# Patient Record
Sex: Female | Born: 1990 | Hispanic: Yes | Smoking: Former smoker
Health system: Southern US, Community
[De-identification: ages and names within clinical notes are randomized; demographics above are authoritative.]

## PROBLEM LIST (undated history)

## (undated) DIAGNOSIS — K029 Dental caries, unspecified: Secondary | ICD-10-CM

## (undated) DIAGNOSIS — S329XXA Fracture of unspecified parts of lumbosacral spine and pelvis, initial encounter for closed fracture: Secondary | ICD-10-CM

## (undated) DIAGNOSIS — Z9119 Patient's noncompliance with other medical treatment and regimen: Secondary | ICD-10-CM

## (undated) DIAGNOSIS — F502 Bulimia nervosa, unspecified: Secondary | ICD-10-CM

## (undated) DIAGNOSIS — E119 Type 2 diabetes mellitus without complications: Secondary | ICD-10-CM

## (undated) DIAGNOSIS — A630 Anogenital (venereal) warts: Secondary | ICD-10-CM

## (undated) DIAGNOSIS — Z91199 Patient's noncompliance with other medical treatment and regimen due to unspecified reason: Secondary | ICD-10-CM

---
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
## 2010-01-05 ENCOUNTER — Ambulatory Visit: Payer: Self-pay | Admitting: Obstetrics & Gynecology

## 2010-01-08 ENCOUNTER — Ambulatory Visit: Payer: Self-pay | Admitting: Obstetrics & Gynecology

## 2010-01-11 ENCOUNTER — Ambulatory Visit: Payer: Self-pay | Admitting: Obstetrics and Gynecology

## 2010-01-11 ENCOUNTER — Inpatient Hospital Stay (HOSPITAL_COMMUNITY): Admission: AD | Admit: 2010-01-11 | Discharge: 2010-01-11 | Payer: Self-pay | Admitting: Family Medicine

## 2010-01-12 ENCOUNTER — Ambulatory Visit: Payer: Self-pay | Admitting: Obstetrics and Gynecology

## 2010-01-15 ENCOUNTER — Ambulatory Visit: Payer: Self-pay | Admitting: Obstetrics & Gynecology

## 2010-01-19 ENCOUNTER — Ambulatory Visit: Payer: Self-pay | Admitting: Obstetrics & Gynecology

## 2010-01-22 ENCOUNTER — Ambulatory Visit: Payer: Self-pay | Admitting: Obstetrics and Gynecology

## 2010-12-08 LAB — URINALYSIS, ROUTINE W REFLEX MICROSCOPIC
Bilirubin Urine: NEGATIVE
Hgb urine dipstick: NEGATIVE
Ketones, ur: NEGATIVE mg/dL
Nitrite: NEGATIVE
Protein, ur: NEGATIVE mg/dL
Specific Gravity, Urine: 1.015 (ref 1.005–1.030)
Urobilinogen, UA: 0.2 mg/dL (ref 0.0–1.0)

## 2010-12-08 LAB — POCT URINALYSIS DIP (DEVICE)
Bilirubin Urine: NEGATIVE
Bilirubin Urine: NEGATIVE
Glucose, UA: 250 mg/dL — AB
Hgb urine dipstick: NEGATIVE
Ketones, ur: NEGATIVE mg/dL
Ketones, ur: NEGATIVE mg/dL
Protein, ur: NEGATIVE mg/dL
Protein, ur: NEGATIVE mg/dL
Specific Gravity, Urine: >=1.03 (ref 1.005–1.030)
Urobilinogen, UA: 0.2 mg/dL (ref 0.0–1.0)
Urobilinogen, UA: 0.2 mg/dL (ref 0.0–1.0)
pH: 5 (ref 5.0–8.0)
pH: 6 (ref 5.0–8.0)

## 2010-12-09 LAB — POCT URINALYSIS DIP (DEVICE)
Bilirubin Urine: NEGATIVE
Glucose, UA: NEGATIVE mg/dL
Hgb urine dipstick: NEGATIVE
pH: 5.5 (ref 5.0–8.0)

## 2010-12-13 LAB — URINALYSIS, ROUTINE W REFLEX MICROSCOPIC
Hgb urine dipstick: NEGATIVE
Ketones, ur: NEGATIVE mg/dL
Protein, ur: NEGATIVE mg/dL
Specific Gravity, Urine: 1.02 (ref 1.005–1.030)
Urobilinogen, UA: 0.2 mg/dL (ref 0.0–1.0)

## 2010-12-13 LAB — POCT URINALYSIS DIP (DEVICE)
Bilirubin Urine: NEGATIVE
Bilirubin Urine: NEGATIVE
Bilirubin Urine: NEGATIVE
Glucose, UA: NEGATIVE mg/dL
Glucose, UA: NEGATIVE mg/dL
Ketones, ur: NEGATIVE mg/dL
Ketones, ur: NEGATIVE mg/dL
Ketones, ur: NEGATIVE mg/dL
Protein, ur: NEGATIVE mg/dL
Specific Gravity, Urine: 1.02 (ref 1.005–1.030)
pH: 6 (ref 5.0–8.0)
pH: 6.5 (ref 5.0–8.0)

## 2010-12-13 LAB — DIFFERENTIAL
Basophils Absolute: 0.1 10*3/uL (ref 0.0–0.1)
Eosinophils Absolute: 0.1 10*3/uL (ref 0.0–0.7)
Eosinophils Relative: 1 % (ref 0–5)
Monocytes Absolute: 0.5 10*3/uL (ref 0.1–1.0)
Monocytes Relative: 6 % (ref 3–12)
Neutrophils Relative %: 73 % (ref 43–77)

## 2010-12-13 LAB — RPR: RPR Ser Ql: NONREACTIVE

## 2010-12-13 LAB — CBC
MCV: 83.4 fL (ref 78.0–100.0)
RBC: 4.17 MIL/uL (ref 3.87–5.11)
WBC: 8.5 10*3/uL (ref 4.0–10.5)

## 2010-12-13 LAB — HEMOGLOBIN A1C: Hgb A1c MFr Bld: 6.5 % — ABNORMAL HIGH (ref 4.6–6.1)

## 2010-12-13 LAB — HIV ANTIBODY (ROUTINE TESTING W REFLEX): HIV: NONREACTIVE

## 2010-12-13 LAB — HEPATITIS B SURFACE ANTIGEN: Hepatitis B Surface Ag: NEGATIVE

## 2010-12-23 LAB — WET PREP, GENITAL
Trich, Wet Prep: NONE SEEN
Yeast Wet Prep HPF POC: NONE SEEN

## 2010-12-23 LAB — URINE CULTURE

## 2010-12-23 LAB — GC/CHLAMYDIA PROBE AMP, GENITAL: GC Probe Amp, Genital: NEGATIVE

## 2010-12-23 LAB — URINE MICROSCOPIC-ADD ON

## 2010-12-23 LAB — URINALYSIS, ROUTINE W REFLEX MICROSCOPIC
Glucose, UA: 250 mg/dL — AB
Leukocytes, UA: NEGATIVE
Specific Gravity, Urine: 1.02 (ref 1.005–1.030)
pH: 7.5 (ref 5.0–8.0)

## 2010-12-24 LAB — URINE MICROSCOPIC-ADD ON

## 2010-12-24 LAB — URINALYSIS, ROUTINE W REFLEX MICROSCOPIC
Glucose, UA: >1000 mg/dL — AB
Ketones, ur: 15 mg/dL — AB
Leukocytes, UA: NEGATIVE
Nitrite: NEGATIVE
Specific Gravity, Urine: 1.037 — ABNORMAL HIGH (ref 1.005–1.030)
pH: 6.5 (ref 5.0–8.0)

## 2010-12-24 LAB — BASIC METABOLIC PANEL
BUN: 14 mg/dL (ref 6–23)
Chloride: 100 mEq/L (ref 96–112)
Glucose, Bld: 359 mg/dL — ABNORMAL HIGH (ref 70–99)
Potassium: 3.8 mEq/L (ref 3.5–5.1)
Sodium: 134 mEq/L — ABNORMAL LOW (ref 135–145)

## 2010-12-25 LAB — BASIC METABOLIC PANEL
BUN: 14 mg/dL (ref 6–23)
CO2: 29 mEq/L (ref 19–32)
Calcium: 9.5 mg/dL (ref 8.4–10.5)
Chloride: 100 mEq/L (ref 96–112)
Creatinine, Ser: 0.6 mg/dL (ref 0.4–1.2)
GFR calc Af Amer: >60 mL/min (ref >60)
Glucose, Bld: 203 mg/dL — ABNORMAL HIGH (ref 70–99)

## 2010-12-25 LAB — URINE MICROSCOPIC-ADD ON

## 2010-12-25 LAB — URINALYSIS, ROUTINE W REFLEX MICROSCOPIC
Glucose, UA: 100 mg/dL — AB
Ketones, ur: NEGATIVE mg/dL
Protein, ur: NEGATIVE mg/dL
Urobilinogen, UA: 1 mg/dL (ref 0.0–1.0)

## 2010-12-25 LAB — CBC
MCHC: 33.8 g/dL (ref 30.0–36.0)
MCV: 81.8 fL (ref 78.0–100.0)
Platelets: 240 10*3/uL (ref 150–400)
RBC: 4.88 MIL/uL (ref 3.87–5.11)
RDW: 13.1 % (ref 11.5–15.5)

## 2010-12-25 LAB — DIFFERENTIAL
Basophils Absolute: 0.1 10*3/uL (ref 0.0–0.1)
Basophils Relative: 1 % (ref 0–1)
Eosinophils Absolute: 0.3 10*3/uL (ref 0.0–0.7)
Monocytes Relative: 6 % (ref 3–12)
Neutro Abs: 5.4 10*3/uL (ref 1.7–7.7)
Neutrophils Relative %: 61 % (ref 43–77)

## 2010-12-25 LAB — GLUCOSE, CAPILLARY: Glucose-Capillary: 203 mg/dL — ABNORMAL HIGH (ref 70–99)

## 2010-12-25 LAB — PREGNANCY, URINE: Preg Test, Ur: NEGATIVE

## 2010-12-28 LAB — URINALYSIS, ROUTINE W REFLEX MICROSCOPIC
Hgb urine dipstick: NEGATIVE
Nitrite: NEGATIVE
Specific Gravity, Urine: 1.025 (ref 1.005–1.030)
Urobilinogen, UA: 0.2 mg/dL (ref 0.0–1.0)
pH: 5.5 (ref 5.0–8.0)

## 2010-12-28 LAB — WET PREP, GENITAL
Clue Cells Wet Prep HPF POC: NONE SEEN
Trich, Wet Prep: NONE SEEN

## 2010-12-28 LAB — POCT PREGNANCY, URINE: Preg Test, Ur: NEGATIVE

## 2010-12-28 LAB — GC/CHLAMYDIA PROBE AMP, GENITAL: GC Probe Amp, Genital: NEGATIVE

## 2011-06-28 LAB — URINALYSIS, ROUTINE W REFLEX MICROSCOPIC
Glucose, UA: NEGATIVE
Ketones, ur: NEGATIVE
Protein, ur: NEGATIVE
Urobilinogen, UA: 0.2

## 2011-06-28 LAB — GC/CHLAMYDIA PROBE AMP, GENITAL: GC Probe Amp, Genital: NEGATIVE

## 2011-06-28 LAB — WET PREP, GENITAL

## 2011-06-29 LAB — RAPID STREP SCREEN (MED CTR MEBANE ONLY): Streptococcus, Group A Screen (Direct): NEGATIVE

## 2011-06-30 LAB — URINALYSIS, ROUTINE W REFLEX MICROSCOPIC
Ketones, ur: NEGATIVE
Nitrite: NEGATIVE
Protein, ur: NEGATIVE

## 2011-07-02 LAB — URINALYSIS, ROUTINE W REFLEX MICROSCOPIC
Bilirubin Urine: NEGATIVE
Glucose, UA: NEGATIVE
Hgb urine dipstick: NEGATIVE
Specific Gravity, Urine: 1.025
pH: 6.5

## 2011-07-02 LAB — CBC
HCT: 35.5 — ABNORMAL LOW
MCV: 79 — ABNORMAL LOW
Platelets: 230
RDW: 15.6 — ABNORMAL HIGH
WBC: 9.1

## 2011-07-02 LAB — GC/CHLAMYDIA PROBE AMP, GENITAL
Chlamydia, DNA Probe: NEGATIVE
GC Probe Amp, Genital: NEGATIVE

## 2011-07-02 LAB — WET PREP, GENITAL
Trich, Wet Prep: NONE SEEN
Yeast Wet Prep HPF POC: NONE SEEN

## 2011-07-02 LAB — POCT PREGNANCY, URINE: Operator id: 251141

## 2011-09-10 IMAGING — US US OB DETAIL+14 WK
1 series · 18 of 28 positions shown · non-contrast
Comparison: none

OBSTETRICAL ULTRASOUND:
 This ultrasound was performed in The [HOSPITAL], and the [facility] OB/GYN report will be stored to [REDACTED] PACS.  This report is also available in [HOSPITAL]?s accessANYware.

[Series 1: us ob detail+14 wk · 101 acquisitions, 18 frames shown]
[im 1/101]
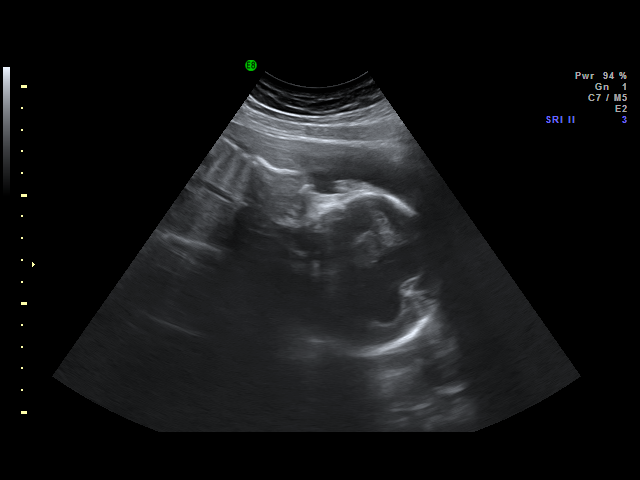
[im 8/101]
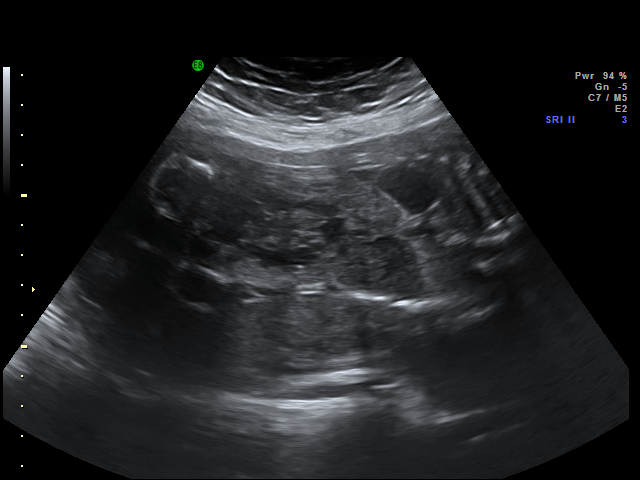
[im 12/101]
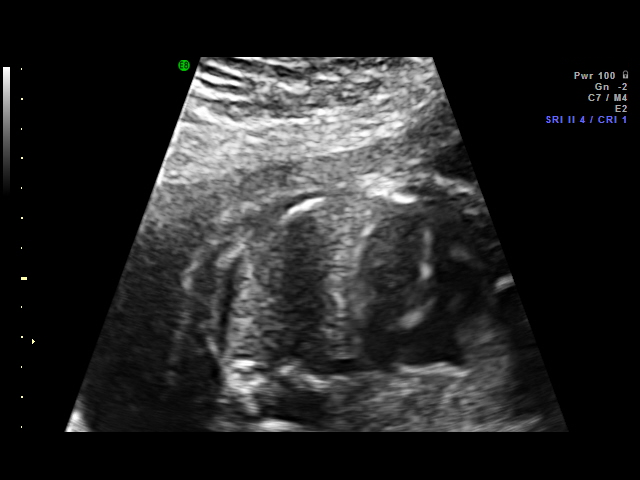
[im 19/101]
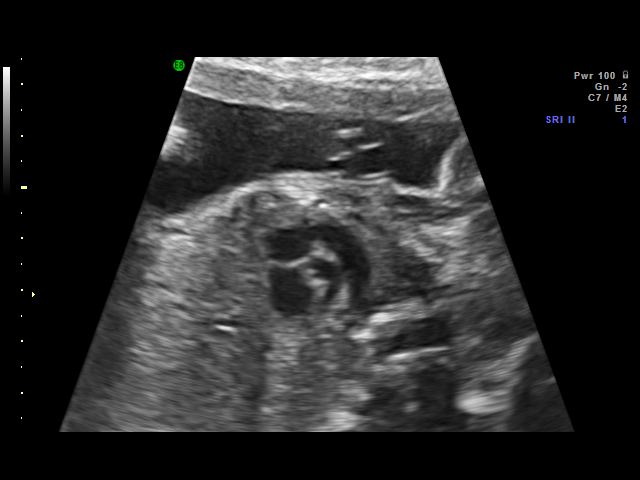
[im 26/101]
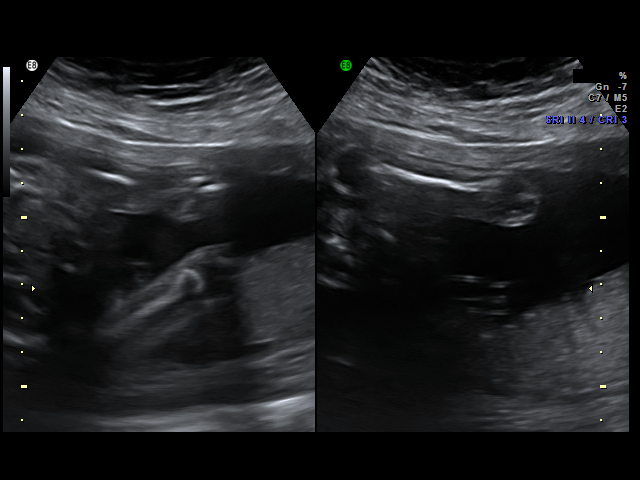
[im 30/101]
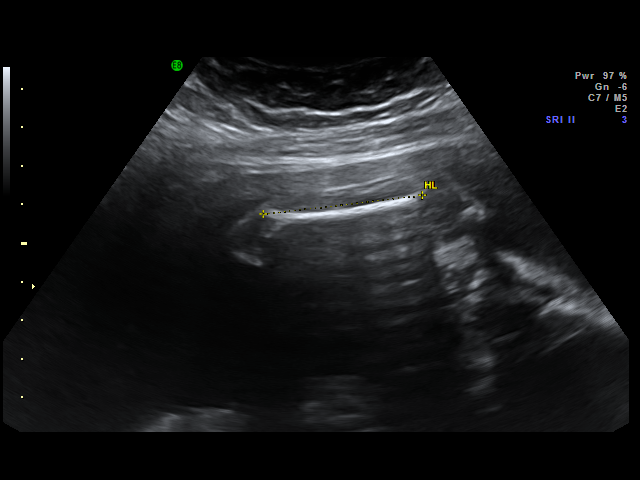
[im 38/101]
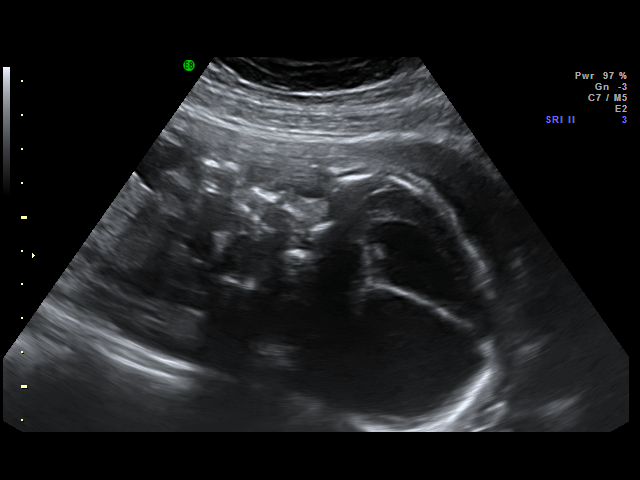
[im 41/101]
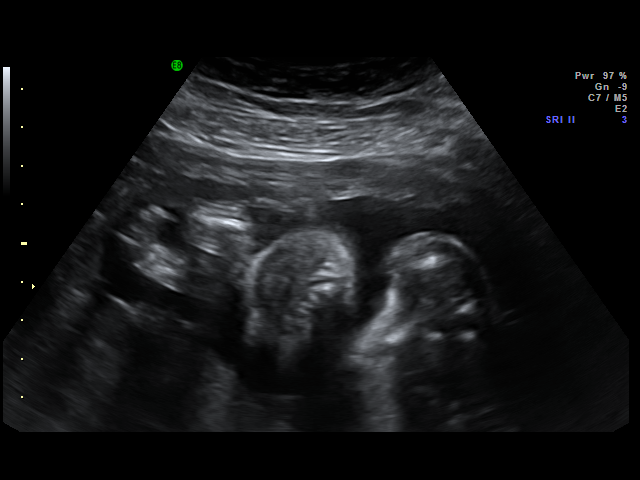
[im 49/101]
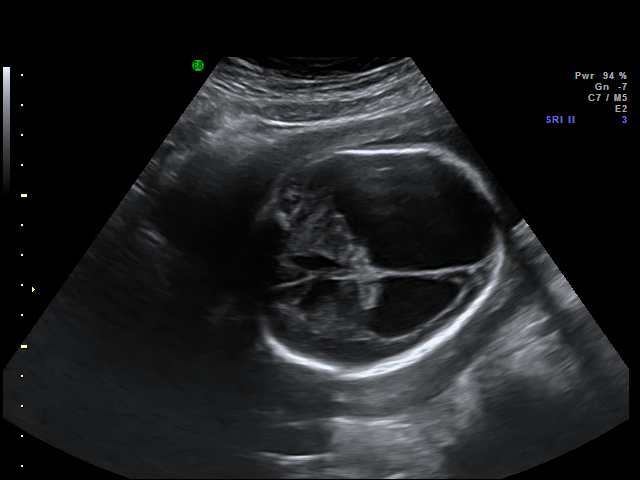
[im 52/101]
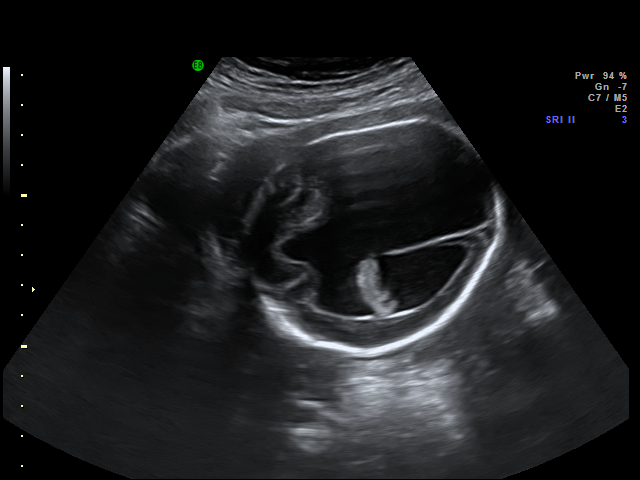
[im 60/101]
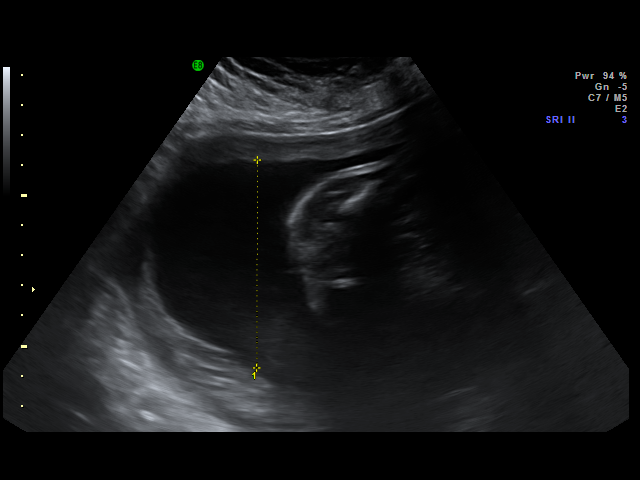
[im 63/101]
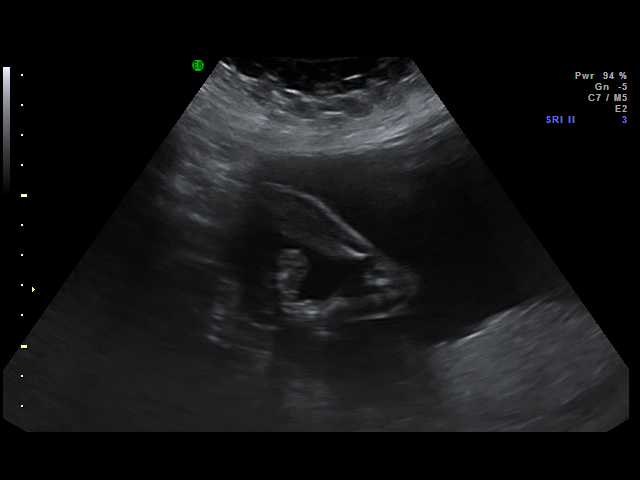
[im 71/101]
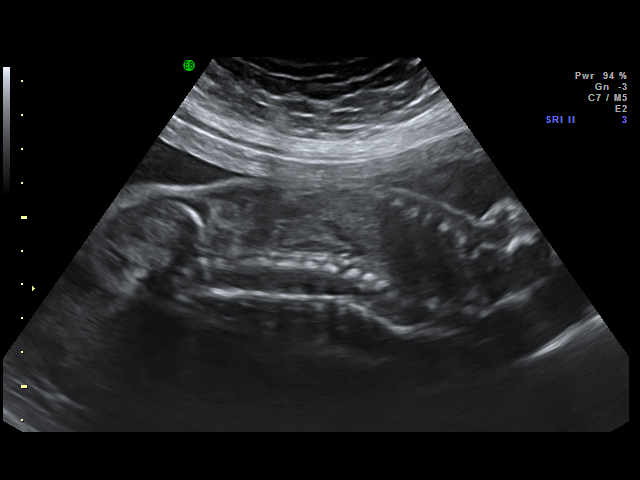
[im 78/101]
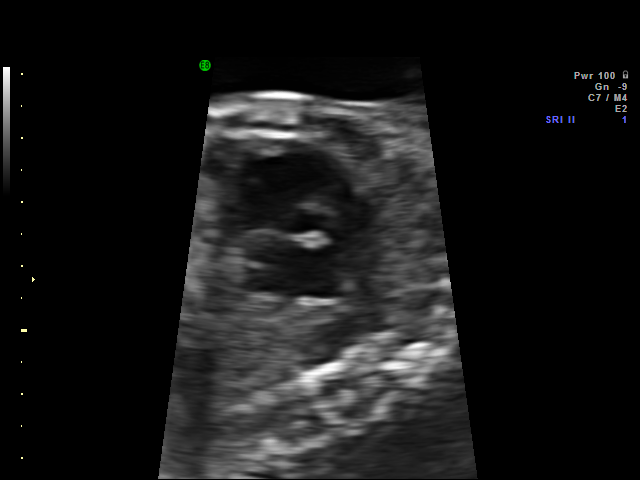
[im 82/101]
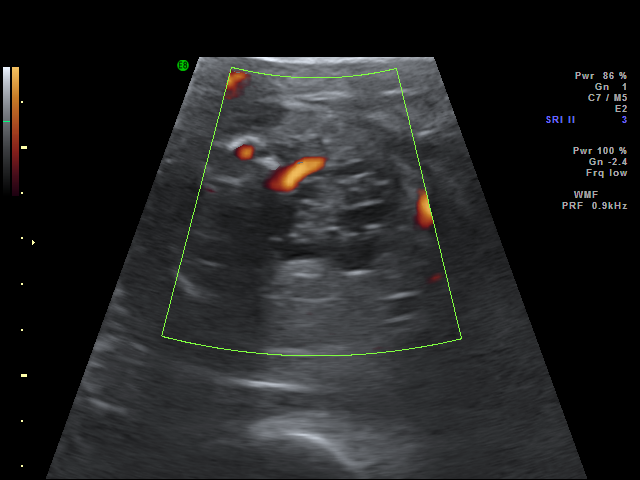
[im 89/101]
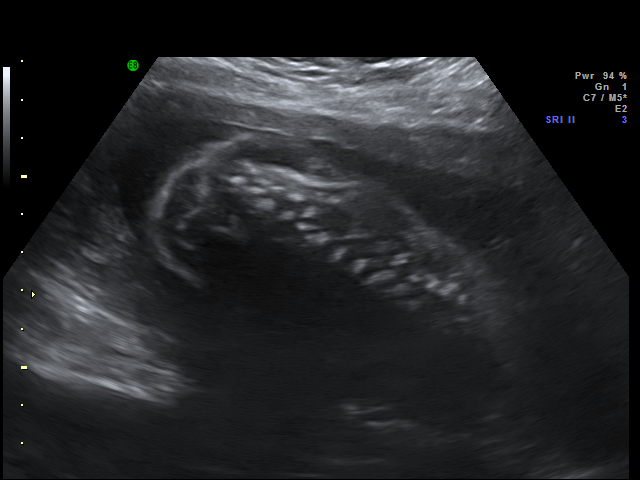
[im 93/101]
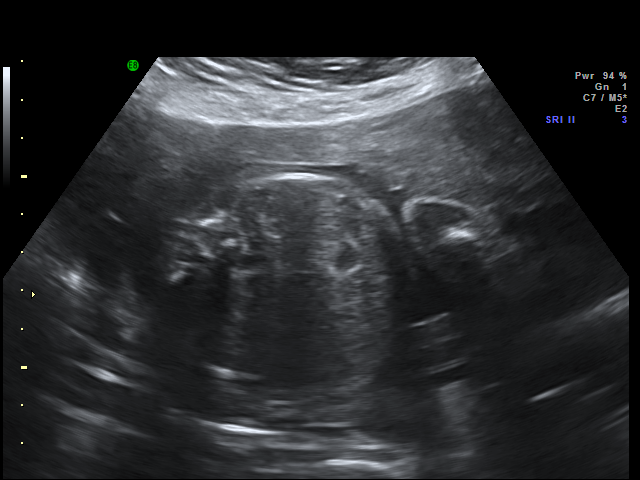
[im 101/101]
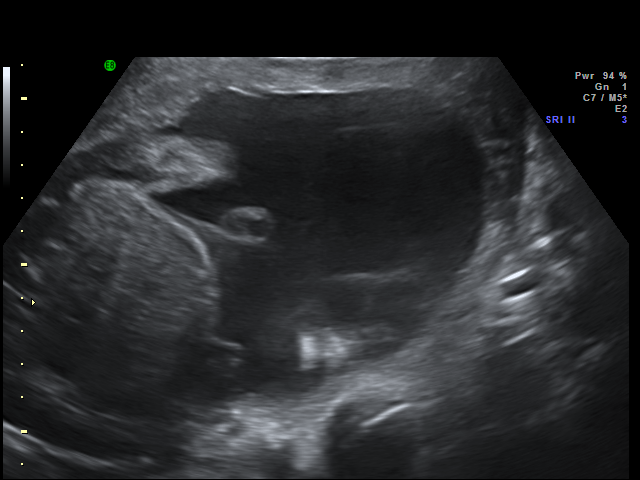

[18 of 28 positions shown; findings below may reference images not displayed]

IMPRESSION: [facility]N has also been faxed to the ordering physician.

## 2012-10-21 ENCOUNTER — Encounter (HOSPITAL_BASED_OUTPATIENT_CLINIC_OR_DEPARTMENT_OTHER): Payer: Self-pay | Admitting: *Deleted

## 2012-10-21 ENCOUNTER — Inpatient Hospital Stay (HOSPITAL_BASED_OUTPATIENT_CLINIC_OR_DEPARTMENT_OTHER)
Admission: EM | Admit: 2012-10-21 | Discharge: 2012-10-27 | DRG: 639 | Disposition: A | Discharge status: Home / Self Care | Payer: Medicaid Other | Attending: Family Medicine | Admitting: Family Medicine

## 2012-10-21 DIAGNOSIS — Z91199 Patient's noncompliance with other medical treatment and regimen due to unspecified reason: Secondary | ICD-10-CM

## 2012-10-21 DIAGNOSIS — E1310 Other specified diabetes mellitus with ketoacidosis without coma: Principal | ICD-10-CM | POA: Diagnosis present

## 2012-10-21 DIAGNOSIS — Z79899 Other long term (current) drug therapy: Secondary | ICD-10-CM

## 2012-10-21 DIAGNOSIS — E1165 Type 2 diabetes mellitus with hyperglycemia: Secondary | ICD-10-CM

## 2012-10-21 DIAGNOSIS — Z9119 Patient's noncompliance with other medical treatment and regimen: Secondary | ICD-10-CM

## 2012-10-21 DIAGNOSIS — IMO0002 Reserved for concepts with insufficient information to code with codable children: Secondary | ICD-10-CM

## 2012-10-21 DIAGNOSIS — E1010 Type 1 diabetes mellitus with ketoacidosis without coma: Secondary | ICD-10-CM

## 2012-10-21 DIAGNOSIS — E1110 Type 2 diabetes mellitus with ketoacidosis without coma: Secondary | ICD-10-CM

## 2012-10-21 DIAGNOSIS — E860 Dehydration: Secondary | ICD-10-CM | POA: Diagnosis present

## 2012-10-21 DIAGNOSIS — Z23 Encounter for immunization: Secondary | ICD-10-CM

## 2012-10-21 DIAGNOSIS — E876 Hypokalemia: Secondary | ICD-10-CM | POA: Diagnosis present

## 2012-10-21 DIAGNOSIS — K0510 Chronic gingivitis, plaque induced: Secondary | ICD-10-CM | POA: Diagnosis present

## 2012-10-21 DIAGNOSIS — I959 Hypotension, unspecified: Secondary | ICD-10-CM | POA: Diagnosis present

## 2012-10-21 HISTORY — DX: Type 2 diabetes mellitus without complications: E11.9

## 2012-10-21 LAB — CBC WITH DIFFERENTIAL/PLATELET
Eosinophils Absolute: 0 10*3/uL (ref 0.0–0.7)
Hemoglobin: 14.6 g/dL (ref 12.0–15.0)
Lymphocytes Relative: 24 % (ref 12–46)
Lymphs Abs: 1.7 10*3/uL (ref 0.7–4.0)
MCH: 31.3 pg (ref 26.0–34.0)
Monocytes Relative: 8 % (ref 3–12)
Neutro Abs: 4.9 10*3/uL (ref 1.7–7.7)
Neutrophils Relative %: 68 % (ref 43–77)
RBC: 4.67 MIL/uL (ref 3.87–5.11)

## 2012-10-21 LAB — BASIC METABOLIC PANEL
BUN: 9 mg/dL (ref 6–23)
Creatinine, Ser: 0.32 mg/dL — ABNORMAL LOW (ref 0.50–1.10)
GFR calc Af Amer: >90 mL/min (ref >90)
GFR calc non Af Amer: >90 mL/min (ref >90)
Potassium: 3 mEq/L — ABNORMAL LOW (ref 3.5–5.1)

## 2012-10-21 LAB — COMPREHENSIVE METABOLIC PANEL
ALT: 14 U/L (ref 0–35)
Alkaline Phosphatase: 93 U/L (ref 39–117)
CO2: 14 mEq/L — ABNORMAL LOW (ref 19–32)
Chloride: 96 mEq/L (ref 96–112)
GFR calc Af Amer: >90 mL/min (ref >90)
GFR calc non Af Amer: >90 mL/min (ref >90)
Glucose, Bld: 735 mg/dL (ref 70–99)
Potassium: 3.9 mEq/L (ref 3.5–5.1)
Sodium: 130 mEq/L — ABNORMAL LOW (ref 135–145)

## 2012-10-21 LAB — PREGNANCY, URINE: Preg Test, Ur: NEGATIVE

## 2012-10-21 LAB — URINALYSIS, ROUTINE W REFLEX MICROSCOPIC
Bilirubin Urine: NEGATIVE
Nitrite: NEGATIVE
Specific Gravity, Urine: 1.035 — ABNORMAL HIGH (ref 1.005–1.030)
pH: 6 (ref 5.0–8.0)

## 2012-10-21 LAB — URINE MICROSCOPIC-ADD ON

## 2012-10-21 LAB — POCT I-STAT 3, VENOUS BLOOD GAS (G3P V)
Acid-base deficit: 13 mmol/L — ABNORMAL HIGH (ref 0.0–2.0)
O2 Saturation: 90 %
TCO2: 13 mmol/L (ref 0–100)

## 2012-10-21 LAB — CBC
Hemoglobin: 12.5 g/dL (ref 12.0–15.0)
MCHC: 35.4 g/dL (ref 30.0–36.0)
RBC: 4.04 MIL/uL (ref 3.87–5.11)

## 2012-10-21 LAB — GLUCOSE, CAPILLARY
Glucose-Capillary: 475 mg/dL — ABNORMAL HIGH (ref 70–99)
Glucose-Capillary: 340 mg/dL — ABNORMAL HIGH (ref 70–99)
Glucose-Capillary: 285 mg/dL — ABNORMAL HIGH (ref 70–99)

## 2012-10-21 MED ORDER — PNEUMOCOCCAL VAC POLYVALENT 25 MCG/0.5ML IJ INJ
0.5000 mL | INJECTION | INTRAMUSCULAR | Status: AC
  Administered 2012-10-22: 0.5 mL via INTRAMUSCULAR
  Filled 2012-10-21: qty 0.5

## 2012-10-21 MED ORDER — SODIUM CHLORIDE 0.9 % IV SOLN
Freq: Once | INTRAVENOUS | Status: AC
  Administered 2012-10-21: 125 mL/h via INTRAVENOUS

## 2012-10-21 MED ORDER — DEXTROSE-NACL 5-0.45 % IV SOLN: INTRAVENOUS | Status: DC

## 2012-10-21 MED ORDER — SODIUM CHLORIDE 0.9 % IV SOLN
INTRAVENOUS | Status: AC
  Administered 2012-10-21: 125 mL/h via INTRAVENOUS

## 2012-10-21 MED ORDER — POTASSIUM CHLORIDE 10 MEQ/100ML IV SOLN
10.0000 meq | INTRAVENOUS | Status: AC
  Administered 2012-10-21 – 2012-10-22 (×2): 10 meq via INTRAVENOUS
  Filled 2012-10-21 (×2): qty 100

## 2012-10-21 MED ORDER — SODIUM CHLORIDE 0.9 % IV BOLUS (SEPSIS)
2000.0000 mL | Freq: Once | INTRAVENOUS | Status: AC
  Administered 2012-10-21: 2000 mL via INTRAVENOUS

## 2012-10-21 MED ORDER — DEXTROSE 50 % IV SOLN: 25.0000 mL | INTRAVENOUS | Status: DC | PRN

## 2012-10-21 MED ORDER — SODIUM CHLORIDE 0.9 % IV SOLN
INTRAVENOUS | Status: DC
  Administered 2012-10-21: 2.3 [IU]/h via INTRAVENOUS
  Administered 2012-10-21: 1.8 [IU]/h via INTRAVENOUS
  Administered 2012-10-22: 2.1 [IU]/h via INTRAVENOUS
  Administered 2012-10-22: 2.3 [IU]/h via INTRAVENOUS
  Administered 2012-10-22: 5.9 [IU]/h via INTRAVENOUS
  Administered 2012-10-22: 4.3 [IU]/h via INTRAVENOUS
  Administered 2012-10-22: 1 [IU]/h via INTRAVENOUS
  Administered 2012-10-22: 3 [IU]/h via INTRAVENOUS
  Administered 2012-10-22: 1.1 [IU]/h via INTRAVENOUS
  Administered 2012-10-22: 7.4 [IU]/h via INTRAVENOUS
  Administered 2012-10-22: 3.8 [IU]/h via INTRAVENOUS
  Administered 2012-10-22: 1.4 [IU]/h via INTRAVENOUS
  Administered 2012-10-22: 1.9 [IU]/h via INTRAVENOUS
  Filled 2012-10-21: qty 1

## 2012-10-21 MED ORDER — INSULIN REGULAR HUMAN 100 UNIT/ML IJ SOLN
INTRAMUSCULAR | Status: AC
  Filled 2012-10-21: qty 1

## 2012-10-21 MED ORDER — INSULIN REGULAR BOLUS VIA INFUSION
0.0000 [IU] | Freq: Three times a day (TID) | INTRAVENOUS | Status: DC
  Filled 2012-10-21: qty 10

## 2012-10-21 MED ORDER — HEPARIN SODIUM (PORCINE) 5000 UNIT/ML IJ SOLN
5000.0000 [IU] | Freq: Three times a day (TID) | INTRAMUSCULAR | Status: DC
  Administered 2012-10-21 – 2012-10-27 (×18): 5000 [IU] via SUBCUTANEOUS
  Filled 2012-10-21 (×20): qty 1

## 2012-10-21 MED ORDER — ONDANSETRON HCL 4 MG/2ML IJ SOLN: 4.0000 mg | Freq: Three times a day (TID) | INTRAMUSCULAR | Status: AC | PRN

## 2012-10-21 MED ORDER — SODIUM CHLORIDE 0.9 % IV SOLN
INTRAVENOUS | Status: AC
  Administered 2012-10-21: 999 mL/h via INTRAVENOUS

## 2012-10-21 MED ORDER — SODIUM CHLORIDE 0.9 % IV SOLN: INTRAVENOUS | Status: DC

## 2012-10-21 MED ORDER — INSULIN REGULAR HUMAN 100 UNIT/ML IJ SOLN: INTRAMUSCULAR | Status: DC

## 2012-10-21 MED ORDER — SODIUM CHLORIDE 0.9 % IV SOLN
INTRAVENOUS | Status: DC
  Administered 2012-10-21: 150 mL/h via INTRAVENOUS

## 2012-10-21 MED ORDER — DEXTROSE-NACL 5-0.45 % IV SOLN
INTRAVENOUS | Status: DC
  Administered 2012-10-22: 125 mL/h via INTRAVENOUS

## 2012-10-21 NOTE — ED Notes (Signed)
Insulin drip started at 5.4 units/ hour

## 2012-10-21 NOTE — ED Provider Notes (Signed)
History     CSN: [id number]  Arrival date & time 10/21/12  1801   First MD Initiated Contact with Patient 10/21/12 1806      Chief Complaint  Patient presents with  . Fatigue    (Consider location/radiation/quality/duration/timing/severity/associated sxs/prior treatment) HPI Julie [name] is a 22 y.o. female who presents with complaint of fatigue, polyuria, polydipsia, weight loss. Symptoms started several months ago, but states worsened about a week ago. States was diagnosed with diabetes about 2.5 years ago while she lived in [place]. Was in [place] at that time. States was started on "pills."  States moved here a year ago. No medications of primary care since then. States has not been feeling this bad until recently. States eating more than usual, but lost 70lb in the last year. Denies recent illnesses, no URI symptoms, no urinary symptoms. Not taking any medications.    Past Medical History  Diagnosis Date  . Diabetes mellitus without complication     Past Surgical History  Procedure Date  . Cesarean section     No family history on file.  History  Substance Use Topics  . Smoking status: Never Smoker   . Smokeless tobacco: Not on file  . Alcohol Use: No    OB History    Grav Para Term Preterm Abortions TAB SAB Ect Mult Living                  Review of Systems  Constitutional: Positive for activity change, appetite change and fatigue. Negative for fever and chills.  HENT: Negative for neck pain and neck stiffness.   Eyes: Positive for visual disturbance.  Respiratory: Negative.   Cardiovascular: Negative.   Gastrointestinal: Positive for nausea. Negative for vomiting, abdominal pain, diarrhea and constipation.  Genitourinary: Positive for frequency.  Musculoskeletal: Negative.   Skin: Negative.   Neurological: Positive for dizziness, weakness, light-headedness and headaches.  Hematological: Negative.     Allergies  Review of patient's allergies  indicates no known allergies.  Home Medications  No current outpatient prescriptions on file.  BP 115/64  Pulse 94  Temp 98.6 F (37 C) (Oral)  Resp 16  Ht 5\' 7"  (1.702 m)  Wt 119 lb (53.978 kg)  BMI 18.64 kg/m2  SpO2 100%  LMP 07/21/2012  Physical Exam  Nursing note and vitals reviewed. Constitutional: She appears well-developed and well-nourished.       Ill appearing  HENT:  Head: Normocephalic.       Oral mucosa and lips are dry  Eyes: Conjunctivae normal are normal.  Neck: Neck supple.  Cardiovascular: Normal rate, regular rhythm and normal heart sounds.   Pulmonary/Chest: Effort normal and breath sounds normal. No respiratory distress. She has no wheezes. She has no rales.  Abdominal: Soft. Bowel sounds are normal. She exhibits no distension. There is no tenderness. There is no rebound.  Musculoskeletal: She exhibits no edema.  Skin: Skin is warm and dry.  Psychiatric: She has a normal mood and affect. Her behavior is normal.    ED Course  Procedures (including critical care time)  6:29 PM Pt seen and evaluated. CBG >600. Fluids, labs, UA. Will monitor.   Results for orders placed during the hospital encounter of 10/21/12  GLUCOSE, CAPILLARY      Component Value Range   Glucose-Capillary >600 (*) 70 - 99 mg/dL   Comment 1 Notify RN    PREGNANCY, URINE      Component Value Range   Preg Test, Ur NEGATIVE  NEGATIVE  URINALYSIS, ROUTINE W REFLEX MICROSCOPIC      Component Value Range   Color, Urine YELLOW  YELLOW   APPearance CLEAR  CLEAR   Specific Gravity, Urine 1.035 (*) 1.005 - 1.030   pH 6.0  5.0 - 8.0   Glucose, UA >1000 (*) NEGATIVE mg/dL   Hgb urine dipstick NEGATIVE  NEGATIVE   Bilirubin Urine NEGATIVE  NEGATIVE   Ketones, ur >80 (*) NEGATIVE mg/dL   Protein, ur NEGATIVE  NEGATIVE mg/dL   Urobilinogen, UA 0.2  0.0 - 1.0 mg/dL   Nitrite NEGATIVE  NEGATIVE   Leukocytes, UA NEGATIVE  NEGATIVE  CBC WITH DIFFERENTIAL      Component Value Range   WBC  7.2  4.0 - 10.5 K/uL   RBC 4.67  3.87 - 5.11 MIL/uL   Hemoglobin 14.6  12.0 - 15.0 g/dL   HCT 13.2  44.0 - 10.2 %   MCV 88.4  78.0 - 100.0 fL   MCH 31.3  26.0 - 34.0 pg   MCHC 35.4  30.0 - 36.0 g/dL   RDW 72.5  36.6 - 44.0 %   Platelets 173  150 - 400 K/uL   Neutrophils Relative 68  43 - 77 %   Neutro Abs 4.9  1.7 - 7.7 K/uL   Lymphocytes Relative 24  12 - 46 %   Lymphs Abs 1.7  0.7 - 4.0 K/uL   Monocytes Relative 8  3 - 12 %   Monocytes Absolute 0.6  0.1 - 1.0 K/uL   Eosinophils Relative 0  0 - 5 %   Eosinophils Absolute 0.0  0.0 - 0.7 K/uL   Basophils Relative 0  0 - 1 %   Basophils Absolute 0.0  0.0 - 0.1 K/uL   Smear Review MORPHOLOGY UNREMARKABLE    COMPREHENSIVE METABOLIC PANEL      Component Value Range   Sodium 130 (*) 135 - 145 mEq/L   Potassium 3.9  3.5 - 5.1 mEq/L   Chloride 96  96 - 112 mEq/L   CO2 14 (*) 19 - 32 mEq/L   Glucose, Bld 735 (*) 70 - 99 mg/dL   BUN 9  6 - 23 mg/dL   Creatinine, Ser 3.47  0.50 - 1.10 mg/dL   Calcium 8.8  8.4 - 42.5 mg/dL   Total Protein 7.3  6.0 - 8.3 g/dL   Albumin 3.6  3.5 - 5.2 g/dL   AST 13  0 - 37 U/L   ALT 14  0 - 35 U/L   Alkaline Phosphatase 93  39 - 117 U/L   Total Bilirubin 0.3  0.3 - 1.2 mg/dL   GFR calc non Af Amer >90  >90 mL/min   GFR calc Af Amer >90  >90 mL/min  KETONES, QUALITATIVE      Component Value Range   Acetone, Bld MODERATE (*) NEGATIVE  POCT I-STAT 3, BLOOD GAS (G3P V)      Component Value Range   pH, Ven 7.254  7.250 - 7.300   pCO2, Ven 28.2 (*) 45.0 - 50.0 mmHg   pO2, Ven 66.0 (*) 30.0 - 45.0 mmHg   Bicarbonate 12.5 (*) 20.0 - 24.0 mEq/L   TCO2 13  0 - 100 mmol/L   O2 Saturation 90.0     Acid-base deficit 13.0 (*) 0.0 - 2.0 mmol/L   Collection site IV START     Drawn by Nurse     Sample type VENOUS    URINE MICROSCOPIC-ADD ON  Component Value Range   Squamous Epithelial / LPF RARE  RARE   WBC, UA 0-2  <3 WBC/hpf   RBC / HPF 0-2  <3 RBC/hpf  GLUCOSE, CAPILLARY      Component Value Range    Glucose-Capillary 475 (*) 70 - 99 mg/dL  GLUCOSE, CAPILLARY      Component Value Range   Glucose-Capillary 340 (*) 70 - 99 mg/dL   Comment 1 Notify RN     Comment 2 Documented in Chart    CBC      Component Value Range   WBC 6.8  4.0 - 10.5 K/uL   RBC 4.04  3.87 - 5.11 MIL/uL   Hemoglobin 12.5  12.0 - 15.0 g/dL   HCT 16.1 (*) 09.6 - 04.5 %   MCV 87.4  78.0 - 100.0 fL   MCH 30.9  26.0 - 34.0 pg   MCHC 35.4  30.0 - 36.0 g/dL   RDW 40.9  81.1 - 91.4 %   Platelets 157  150 - 400 K/uL   No results found.  Pt in DKA. Anion gap 20. Started on glucose stabilizer, will admit.   Spoke with [facility], will accept to step down at [facility]. Pt transferred there in stable condition.       1. DKA (diabetic ketoacidoses)       MDM  Pt with hx of DM, no medications for a year. Here appears to be in DKA. She was given IV fluids, started on glucose stabilizer. Her vital signs remained stable. She was educated on importance of taking her medications and establishing primary care doctor.   She is accepted at [facility]. [facility] team 10.           Lottie Mussel, PA 10/21/12 2354

## 2012-10-21 NOTE — H&P (Signed)
Hospitalist Admission History and Physical  Patient name: Julie [name] Medical record number: [id number] Date of birth: Jun 09, 1991 Age: 22 y.o. Gender: female  Primary Care Provider: No primary provider on file.  Chief Complaint: DKA History of Present Illness:This is a 22 y.o. year old female with presenting with DKA. Patient was formally diagnosed with diabetes approximately 2 years ago in [place] per patient. Patient states she was started on metformin for treatment. Patient states that she took metformin for approximately 6 months and then stopped. Patient states that she been relocated to [place] has not been on medication since. Patient is unsure whether she had a formal diagnosis of type I or type 2 diabetes. Patient does report that her mother is on insulin. Otherwise no other family members with diabetes. Patient states over the past 2-3 weeks she has noticed worsening abdominal pain, nausea polyuria polydipsia. Patient also reports she had approximately a 60 pound weight loss over the past 6 months. Patient states that she has not had any medical care because of insurance. Patient also with mild headache. Patient denies any dysuria, chest pain, shortness of breath.  There is no problem list on file for this patient.  Past Medical History: Past Medical History  Diagnosis Date  . Diabetes mellitus without complication     Past Surgical History: Past Surgical History  Procedure Date  . Cesarean section     Social History: History   Social History  . Marital Status: Legally Separated    Spouse Name: N/A    Number of Children: N/A  . Years of Education: N/A   Social History Main Topics  . Smoking status: Never Smoker   . Smokeless tobacco: None  . Alcohol Use: No  . Drug Use: No  . Sexually Active: Yes    Birth Control/ Protection: None   Other Topics Concern  . None   Social History Narrative  . None    Family History: No family history on  file.  Allergies: Allergies no known allergies  Current Facility-Administered Medications  Medication Dose Route Frequency Provider Last Rate Last Dose  . 0.9 %  sodium chloride infusion   Intravenous Once Tatyana A Kirichenko, PA      . 0.9 %  sodium chloride infusion   Intravenous Continuous Tatyana A Kirichenko, PA      . dextrose 5 %-0.45 % sodium chloride infusion   Intravenous Continuous Tatyana A Kirichenko, PA      . dextrose 50 % solution 25 mL  25 mL Intravenous PRN Tatyana A Kirichenko, PA      . insulin regular (NOVOLIN R,HUMULIN R) 1 Units/mL in sodium chloride 0.9 % 100 mL infusion   Intravenous Continuous Tatyana A Kirichenko, PA      . insulin regular (NOVOLIN R,HUMULIN R) 100 units/mL injection           . insulin regular bolus via infusion 0-10 Units  0-10 Units Intravenous TID WC Tatyana A Kirichenko, PA       No current outpatient prescriptions on file.   Review Of Systems: 12 point ROS negative except as noted above in HPI.  Physical Exam: Filed Vitals:   10/21/12 1930  BP: 104/68  Pulse: 83  Temp: 98.6 F (37 C)  Resp: 16    General: alert HEENT: PERRLA and extra ocular movement intact Heart: S1, S2 normal, no murmur, rub or gallop, regular rate and rhythm Lungs: clear to auscultation Abdomen: mild abdominal tenderness diffusely Extremities: extremities normal, atraumatic, no  cyanosis or edema Skin:no rashes Neurology: normal without focal findings  Labs and Imaging: Lab Results  Component Value Date/Time   NA 130* 10/21/2012  6:21 PM   K 3.9 10/21/2012  6:21 PM   CL 96 10/21/2012  6:21 PM   CO2 14* 10/21/2012  6:21 PM   BUN 9 10/21/2012  6:21 PM   CREATININE 0.60 10/21/2012  6:21 PM   GLUCOSE 735* 10/21/2012  6:21 PM   Lab Results  Component Value Date   WBC 7.2 10/21/2012   HGB 14.6 10/21/2012   HCT 41.3 10/21/2012   MCV 88.4 10/21/2012   PLT 173 10/21/2012   No results found.  CBG (last 3)   Basename 10/22/12 0036 10/21/12 2331 10/21/12 2212  GLUCAP 211*  240* 285*       Assessment and Plan: Julie [name] is a 22 y.o. year old female presenting with diabetice ketoacidosis  DKA: Initial blood sugar of >700. In the 400s s/p IVF. Bicarb of 12. Anion gap 20. Noted anion gap metabolic acidosis. Will start on DKA protocol. Will also check c peptide as it is unclear whether pt is truly a type I vs. Type II diabetic. We'll check A1c. FEN/GI: Pseudohyponatremia. Corrected sodium of 140. Hydration per DKA protocol. N.p.o. in the interim. Potassium currently stable. We'll replete as clinically indicated per DKA protocol. Prophylaxis: Subcutaneous heparin Disposition: Pending further evaluation Code Status: Full code.      Doree Albee MD  Pager: [phone]

## 2012-10-21 NOTE — ED Notes (Signed)
Insulin drip decreased to 2.8

## 2012-10-21 NOTE — ED Notes (Addendum)
Pt has had diabetes for 2-1/2 yrs. Has been feeling weak x 1 wk. Wt loss 180 down to 119 over past year. FSBS "high". Has not had meds for a year.

## 2012-10-21 NOTE — ED Notes (Signed)
Assigned to bed 2608 @ [facility], RN notified, [facility] called for transport.

## 2012-10-21 NOTE — ED Notes (Signed)
Insulin drip decreased to 4.2 units/ hour per glucostabilizer

## 2012-10-22 DIAGNOSIS — I959 Hypotension, unspecified: Secondary | ICD-10-CM | POA: Diagnosis present

## 2012-10-22 DIAGNOSIS — E876 Hypokalemia: Secondary | ICD-10-CM | POA: Diagnosis present

## 2012-10-22 DIAGNOSIS — E1010 Type 1 diabetes mellitus with ketoacidosis without coma: Secondary | ICD-10-CM | POA: Insufficient documentation

## 2012-10-22 DIAGNOSIS — E1110 Type 2 diabetes mellitus with ketoacidosis without coma: Secondary | ICD-10-CM

## 2012-10-22 LAB — BASIC METABOLIC PANEL
BUN: 7 mg/dL (ref 6–23)
BUN: 11 mg/dL (ref 6–23)
CO2: 19 mEq/L (ref 19–32)
CO2: 18 mEq/L — ABNORMAL LOW (ref 19–32)
Calcium: 7.7 mg/dL — ABNORMAL LOW (ref 8.4–10.5)
Calcium: 8 mg/dL — ABNORMAL LOW (ref 8.4–10.5)
Calcium: 7.7 mg/dL — ABNORMAL LOW (ref 8.4–10.5)
Calcium: 7.9 mg/dL — ABNORMAL LOW (ref 8.4–10.5)
Calcium: 8.2 mg/dL — ABNORMAL LOW (ref 8.4–10.5)
Chloride: 104 mEq/L (ref 96–112)
Chloride: 105 mEq/L (ref 96–112)
Chloride: 98 mEq/L (ref 96–112)
Creatinine, Ser: 0.29 mg/dL — ABNORMAL LOW (ref 0.50–1.10)
Creatinine, Ser: 0.43 mg/dL — ABNORMAL LOW (ref 0.50–1.10)
Creatinine, Ser: 0.49 mg/dL — ABNORMAL LOW (ref 0.50–1.10)
GFR calc Af Amer: >90 mL/min (ref >90)
GFR calc Af Amer: >90 mL/min (ref >90)
GFR calc Af Amer: >90 mL/min (ref >90)
GFR calc Af Amer: >90 mL/min (ref >90)
GFR calc Af Amer: >90 mL/min (ref >90)
GFR calc non Af Amer: >90 mL/min (ref >90)
GFR calc non Af Amer: >90 mL/min (ref >90)
GFR calc non Af Amer: >90 mL/min (ref >90)
GFR calc non Af Amer: >90 mL/min (ref >90)
GFR calc non Af Amer: >90 mL/min (ref >90)
Glucose, Bld: 244 mg/dL — ABNORMAL HIGH (ref 70–99)
Potassium: 2.4 mEq/L — CL (ref 3.5–5.1)
Potassium: 2.8 mEq/L — ABNORMAL LOW (ref 3.5–5.1)
Potassium: 2.9 mEq/L — ABNORMAL LOW (ref 3.5–5.1)
Potassium: 3.4 mEq/L — ABNORMAL LOW (ref 3.5–5.1)
Sodium: 134 mEq/L — ABNORMAL LOW (ref 135–145)
Sodium: 135 mEq/L (ref 135–145)
Sodium: 134 mEq/L — ABNORMAL LOW (ref 135–145)
Sodium: 132 mEq/L — ABNORMAL LOW (ref 135–145)
Sodium: 130 mEq/L — ABNORMAL LOW (ref 135–145)

## 2012-10-22 LAB — C-PEPTIDE: C-Peptide: 0.38 ng/mL — ABNORMAL LOW (ref 0.80–3.90)

## 2012-10-22 LAB — GLUCOSE, CAPILLARY
Glucose-Capillary: 96 mg/dL (ref 70–99)
Glucose-Capillary: 92 mg/dL (ref 70–99)
Glucose-Capillary: 155 mg/dL — ABNORMAL HIGH (ref 70–99)
Glucose-Capillary: 202 mg/dL — ABNORMAL HIGH (ref 70–99)
Glucose-Capillary: 166 mg/dL — ABNORMAL HIGH (ref 70–99)
Glucose-Capillary: 186 mg/dL — ABNORMAL HIGH (ref 70–99)

## 2012-10-22 LAB — HEMOGLOBIN A1C: Mean Plasma Glucose: 444 mg/dL — ABNORMAL HIGH (ref <117)

## 2012-10-22 LAB — MRSA PCR SCREENING: MRSA by PCR: NEGATIVE

## 2012-10-22 MED ORDER — ACETAMINOPHEN 325 MG PO TABS
650.0000 mg | ORAL_TABLET | Freq: Four times a day (QID) | ORAL | Status: DC | PRN
  Administered 2012-10-22: 650 mg via ORAL
  Filled 2012-10-22 (×2): qty 2

## 2012-10-22 MED ORDER — POTASSIUM CHLORIDE CRYS ER 20 MEQ PO TBCR
40.0000 meq | EXTENDED_RELEASE_TABLET | Freq: Once | ORAL | Status: AC
  Administered 2012-10-22: 40 meq via ORAL
  Filled 2012-10-22: qty 2

## 2012-10-22 MED ORDER — INSULIN ASPART 100 UNIT/ML ~~LOC~~ SOLN
0.0000 [IU] | Freq: Every day | SUBCUTANEOUS | Status: DC
  Administered 2012-10-22: 3 [IU] via SUBCUTANEOUS
  Administered 2012-10-23: 11 [IU] via SUBCUTANEOUS

## 2012-10-22 MED ORDER — SODIUM CHLORIDE 0.9 % IV SOLN
INTRAVENOUS | Status: DC
  Administered 2012-10-22: 1000 mL via INTRAVENOUS
  Administered 2012-10-23: 22:00:00 via INTRAVENOUS

## 2012-10-22 MED ORDER — INSULIN ASPART PROT & ASPART (70-30 MIX) 100 UNIT/ML ~~LOC~~ SUSP
16.0000 [IU] | Freq: Two times a day (BID) | SUBCUTANEOUS | Status: DC
  Administered 2012-10-22 – 2012-10-23 (×2): 16 [IU] via SUBCUTANEOUS
  Filled 2012-10-22: qty 10

## 2012-10-22 MED ORDER — SODIUM CHLORIDE 0.9 % IV BOLUS (SEPSIS)
1000.0000 mL | Freq: Once | INTRAVENOUS | Status: AC
  Administered 2012-10-22: 1000 mL via INTRAVENOUS

## 2012-10-22 MED ORDER — POTASSIUM CHLORIDE 2 MEQ/ML IV SOLN
INTRAVENOUS | Status: DC
  Administered 2012-10-22: 09:00:00 via INTRAVENOUS
  Filled 2012-10-22 (×2): qty 1000

## 2012-10-22 MED ORDER — LIVING WELL WITH DIABETES BOOK - IN SPANISH
Freq: Once | Status: AC
  Administered 2012-10-22: 1
  Filled 2012-10-22: qty 1

## 2012-10-22 MED ORDER — INSULIN ASPART 100 UNIT/ML ~~LOC~~ SOLN
0.0000 [IU] | Freq: Every day | SUBCUTANEOUS | Status: DC
  Administered 2012-10-22: 4 [IU] via SUBCUTANEOUS

## 2012-10-22 MED ORDER — POTASSIUM CHLORIDE 10 MEQ/100ML IV SOLN
10.0000 meq | INTRAVENOUS | Status: AC
  Administered 2012-10-22 (×4): 10 meq via INTRAVENOUS
  Filled 2012-10-22: qty 400

## 2012-10-22 MED ORDER — ACETAMINOPHEN 325 MG PO TABS
650.0000 mg | ORAL_TABLET | ORAL | Status: DC | PRN
  Administered 2012-10-22 – 2012-10-27 (×4): 650 mg via ORAL
  Filled 2012-10-22 (×3): qty 2

## 2012-10-22 NOTE — Progress Notes (Signed)
CRITICAL VALUE ALERT  Critical value received:  Potassium 2.4  Date of notification:  10/22/12  Time of notification:  0647  Critical value read back:yes  Nurse who received alert:  TJairo Ben RN  MD notified (1st page):  Dr. Alvester Morin  Time of first page:  [phone]    Responding MD: Dr. Alvester Morin  Time MD responded:  [phone]

## 2012-10-22 NOTE — Progress Notes (Addendum)
[facility] Progress Note [place] TEAM 1 - Stepdown/ICU TEAM   Julie [name] OZH:[id number] DOB: 30-Aug-1991 DOA: 10/21/2012 PCP: No primary provider on file.  Brief narrative: This is a 22 y.o. year old female with presenting with DKA. Patient was formally diagnosed with diabetes approximately 2 years ago in [place] per patient. Patient states she was started on metformin for treatment. Patient states that she took metformin for approximately 6 months and then stopped. Patient states that she been relocated to [place] has not been on medication since. Patient is unsure whether she had a formal diagnosis of type I or type 2 diabetes. Patient does report that her mother is on insulin. Otherwise no other family members with diabetes. Patient states over the past 2-3 weeks she has noticed worsening abdominal pain, nausea polyuria polydipsia. Patient also reports she had approximately a 60 pound weight loss over the past 6 months. Patient states that she has not had any medical care because of insurance.  Assessment/Plan: Active Problems:  DKA, type 1 Continues to be acidotic, awaiting repeat the med which is delayed. Hemoglobin A 1C is also pending Will need to start with 70/30 as patient does not have insurance.  Will start diabetes teaching  We'll have social work get involved as well to help with finding of PCP in affording medications. Note patient is from [place] and will need of PCP in that area.   Hypotension Likely dehydration-patient may also have a low baseline blood pressure.   Hypokalemia Replacing aggressively    Code Status: Full code Family Communication: None Disposition Plan: Follow in step down while on insulin infusion DVT prophylaxis: Heparin   HPI/Subjective: Patient is alert, not very talkative. She has no questions and complains only of a headache. She does not admit to any recent fevers flulike symptoms or cough. I've explained that  she will need insulin and we will start teaching her today. Continues to be overall very quiet and have asked her to write down questions.   Objective: Blood pressure 91/85, pulse 68, temperature 98.4 F (36.9 C), temperature source Oral, resp. rate 12, height 5\' 7"  (1.702 m), weight 53.978 kg (119 lb), last menstrual period 07/21/2012, SpO2 99.00%.  Intake/Output Summary (Last 24 hours) at 10/22/12 1233 Last data filed at 10/22/12 1140  Gross per 24 hour  Intake 2160.2 ml  Output   1000 ml  Net 1160.2 ml     Exam: General: No acute respiratory distress Lungs: Clear to auscultation bilaterally without wheezes or crackles Cardiovascular: Regular rate and rhythm without murmur gallop or rub normal S1 and S2 Abdomen: Nontender, nondistended, soft, bowel sounds positive, no rebound, no ascites, no appreciable mass Extremities: No significant cyanosis, clubbing, or edema bilateral lower extremities  Data Reviewed: Basic Metabolic Panel:  Lab 10/22/12 6295 10/22/12 0222 10/22/12 0036 10/21/12 2239 10/21/12 1821  NA 135 134* 133* 132* 130*  K 2.4* 3.0* 2.8* 3.0* 3.9  CL 107 106 106 103 96  CO2 18* 17* 15* 14* 14*  GLUCOSE 156* 202* 227* 297* 735*  BUN 8 8 9 9 9   CREATININE 0.30* 0.29* 0.29* 0.32* 0.60  CALCIUM 8.2* 8.0* 7.7* 8.1* 8.8  MG -- -- -- -- --  PHOS -- -- -- -- --   Liver Function Tests:  Lab 10/21/12 1821  AST 13  ALT 14  ALKPHOS 93  BILITOT 0.3  PROT 7.3  ALBUMIN 3.6   No results found for this basename: LIPASE:5,AMYLASE:5 in the last 168 hours  No results found for this basename: AMMONIA:5 in the last 168 hours CBC:  Lab 10/21/12 2239 10/21/12 1821  WBC 6.8 7.2  NEUTROABS -- 4.9  HGB 12.5 14.6  HCT 35.3* 41.3  MCV 87.4 88.4  PLT 157 173   Cardiac Enzymes: No results found for this basename: CKTOTAL:5,CKMB:5,CKMBINDEX:5,TROPONINI:5 in the last 168 hours BNP (last 3 results) No results found for this basename: PROBNP:3 in the last 8760  hours CBG:  Lab 10/22/12 1139 10/22/12 1036 10/22/12 0938 10/22/12 0836 10/22/12 0740  GLUCAP 202* 175* 155* 156* 115*    Recent Results (from the past 240 hour(s))  MRSA PCR SCREENING     Status: Normal   Collection Time   10/21/12 10:29 PM      Component Value Range Status Comment   MRSA by PCR NEGATIVE  NEGATIVE Final      Studies:  Recent x-ray studies have been reviewed in detail by the Attending Physician  Scheduled Meds:  Reviewed in detail by the Attending Physician   [facility]  If 7PM-7AM, please contact night-coverage [email or web address] Password [facility] 10/22/2012, 12:33 PM   LOS: 1 day

## 2012-10-22 NOTE — ED Provider Notes (Signed)
Medical screening examination/treatment/procedure(s) were conducted as a shared visit with non-physician practitioner(s) and myself.  I personally evaluated the patient during the encounter.  Patient seen for complaints of fatigue and found to be hyperglycemic with signs of DKA. Patient required hospitalization for further management. Patient has been off her meds for more than a year, has not been taking her sugars.  Gilda Crease, MD 10/22/12 [phone]

## 2012-10-22 NOTE — Progress Notes (Signed)
Pt BMET results are in, with an anion gap of 8.  This was reported to Butler Denmark, MD.  New orders received. Will continue to monitor.  Salomon Mast, RN

## 2012-10-22 NOTE — Progress Notes (Signed)
Transitioning off of insulin infusion to novolog 70/30-patient will only be able to afford Humulin 70/30 therefore we will need to ensure that she knows to take it 30 minutes prior to breakfast and dinner. Currently placed her on a sliding scale for lunch. I suspect she will need extensive teaching as an outpatient.

## 2012-10-23 DIAGNOSIS — E1010 Type 1 diabetes mellitus with ketoacidosis without coma: Secondary | ICD-10-CM

## 2012-10-23 DIAGNOSIS — E876 Hypokalemia: Secondary | ICD-10-CM

## 2012-10-23 DIAGNOSIS — I959 Hypotension, unspecified: Secondary | ICD-10-CM

## 2012-10-23 LAB — GLUCOSE, CAPILLARY
Glucose-Capillary: >600 mg/dL (ref 70–99)
Glucose-Capillary: 315 mg/dL — ABNORMAL HIGH (ref 70–99)

## 2012-10-23 MED ORDER — HYDROXYZINE HCL 25 MG PO TABS
25.0000 mg | ORAL_TABLET | Freq: Four times a day (QID) | ORAL | Status: DC | PRN
  Administered 2012-10-23: 25 mg via ORAL
  Filled 2012-10-23 (×2): qty 1

## 2012-10-23 MED ORDER — POTASSIUM CHLORIDE CRYS ER 20 MEQ PO TBCR
40.0000 meq | EXTENDED_RELEASE_TABLET | Freq: Once | ORAL | Status: AC
  Administered 2012-10-23: 40 meq via ORAL
  Filled 2012-10-23: qty 2

## 2012-10-23 MED ORDER — INSULIN ASPART 100 UNIT/ML ~~LOC~~ SOLN
0.0000 [IU] | Freq: Three times a day (TID) | SUBCUTANEOUS | Status: DC
  Administered 2012-10-23: 7 [IU] via SUBCUTANEOUS
  Administered 2012-10-24: 11 [IU] via SUBCUTANEOUS
  Administered 2012-10-24 (×2): 4 [IU] via SUBCUTANEOUS
  Administered 2012-10-25: 7 [IU] via SUBCUTANEOUS
  Administered 2012-10-25 (×2): 11 [IU] via SUBCUTANEOUS
  Administered 2012-10-26: 7 [IU] via SUBCUTANEOUS
  Administered 2012-10-26: 3 [IU] via SUBCUTANEOUS
  Administered 2012-10-26 – 2012-10-27 (×2): 4 [IU] via SUBCUTANEOUS
  Administered 2012-10-27: 7 [IU] via SUBCUTANEOUS
  Administered 2012-10-27: 4 [IU] via SUBCUTANEOUS

## 2012-10-23 MED ORDER — ONDANSETRON HCL 4 MG/2ML IJ SOLN
4.0000 mg | Freq: Four times a day (QID) | INTRAMUSCULAR | Status: DC | PRN
  Administered 2012-10-24: 4 mg via INTRAVENOUS
  Filled 2012-10-23: qty 2

## 2012-10-23 MED ORDER — LIVING WELL WITH DIABETES BOOK
Freq: Once | Status: AC
  Administered 2012-10-23: 16:00:00
  Filled 2012-10-23: qty 1

## 2012-10-23 MED ORDER — INSULIN ASPART 100 UNIT/ML ~~LOC~~ SOLN
0.0000 [IU] | Freq: Every day | SUBCUTANEOUS | Status: DC
  Administered 2012-10-23: 4 [IU] via SUBCUTANEOUS
  Administered 2012-10-24: 3 [IU] via SUBCUTANEOUS

## 2012-10-23 MED ORDER — INSULIN ASPART PROT & ASPART (70-30 MIX) 100 UNIT/ML ~~LOC~~ SUSP
26.0000 [IU] | Freq: Two times a day (BID) | SUBCUTANEOUS | Status: DC
  Administered 2012-10-23 – 2012-10-24 (×2): 26 [IU] via SUBCUTANEOUS

## 2012-10-23 MED ORDER — DIPHENHYDRAMINE HCL 25 MG PO CAPS
50.0000 mg | ORAL_CAPSULE | ORAL | Status: DC | PRN
  Administered 2012-10-23: 50 mg via ORAL
  Filled 2012-10-23 (×2): qty 1

## 2012-10-23 NOTE — Progress Notes (Signed)
Inpatient Diabetes Program Recommendations  AACE/ADA: New Consensus Statement on Inpatient Glycemic Control (2013)  Target Ranges:  Prepandial:   less than 140 mg/dL      Peak postprandial:   less than 180 mg/dL (1-2 hours)      Critically ill patients:  140 - 180 mg/dL   Reason for Visit: DKA/HHNK   After reviewing labs and history, pt appears to be more of a type 2 pt even though she was in ketoacidosis. However her DKA resolved rather quickly within 4-6 hrs.  Pt states she has lost 60 lbs in the last 6 mos, her HgbA1c is at 17.1%. If this patient was indeed a type 1, she would have been a bit sicker before now and would not have resolved the DKA in that amount of time. Agree with the 70/30 insulin, as she can use the Novolin 70/30 at discharge (a bit cheaper than the analog insulins. Will talk with patient.  Hopefully [facility] will follow this patient, otherwise would suggest pt go to Urgent Care with Dr. Standley Dakins, however he is seeing so many patients at this time that he may not be able to see her iin the near future. Agree with continuation of 70/30 here and titrating to control.    Note: Thank you, Lenor Coffin, RN, CNS, Diabetes Coordinator [phone])

## 2012-10-23 NOTE — Progress Notes (Signed)
Nutrition Brief Note  Patient identified on the Malnutrition Screening Tool (MST) Report. Per pt, she has been losing weight despite a great appetite; discussed that her uncontrolled blood sugars can result in weight fluctuations. Pt states that she eats all of the time and is never feels satisfied. Discussed strategies to better regulate PO intake during diet education.  Body mass index is 20.10 kg/(m^2). Patient meets criteria for normal weight based on current BMI.   Current diet order is Carbohydrate Modified Medium, patient is consuming approximately 100% of meals at this time. Labs and medications reviewed.   No nutrition interventions warranted at this time. If nutrition issues arise, please consult RD.   Jarold Motto MS, RD, LDN Pager: [phone] After-hours pager: [phone]

## 2012-10-23 NOTE — Plan of Care (Addendum)
Problem: Food- and Nutrition-Related Knowledge Deficit (NB-1.1) Goal: Nutrition education Formal process to instruct or train a patient/client in a skill or to impart knowledge to help patients/clients voluntarily manage or modify food choices and eating behavior to maintain or improve health.  Outcome: Completed/Met Date Met:  10/23/12  RD consulted for nutrition education regarding diabetes. Pt reports that she was dx with DM approximately 2 years ago, however she notes that she has never been instructed on dietary habits.    Lab Results  Component Value Date    HGBA1C 17.1* 10/21/2012    RD provided "Carbohydrate Counting for People with Diabetes" handout from the Academy of [employer] and Dietetics. Discussed different food groups and their effects on blood sugar, emphasizing carbohydrate-containing foods. Provided list of carbohydrates and recommended serving sizes of common foods.  Discussed importance of controlled and consistent carbohydrate intake throughout the day. Provided examples of ways to balance meals/snacks and encouraged intake of high-fiber, whole grain complex carbohydrates. Teach back method used.  Expect fair compliance.  Body mass index is 20.10 kg/(m^2). Pt meets criteria for normal weight based on current BMI.  Current diet order is Carbohydrate Modified Medium, patient is consuming approximately 100% of meals at this time. Labs and medications reviewed. No further nutrition interventions warranted at this time. RD contact information provided. If additional nutrition issues arise, please re-consult RD.  Strongly recommend outpatient nutrition counseling.  Jarold Motto MS, RD, LDN Pager: [phone] After-hours pager: [phone]

## 2012-10-23 NOTE — Progress Notes (Signed)
[facility] Progress Note [place] TEAM 1 - Stepdown/ICU TEAM   Julie [name] WUJ:[id number] DOB: 03-23-1991 DOA: 10/21/2012 PCP: No primary provider on file.  Brief narrative: 22 y.o. year old female who presented with DKA. Patient was formally diagnosed with diabetes approximately 2 years ago in [place] per patient. Patient stated she was started on metformin for treatment. Patient stated that she took metformin for approximately 6 months and then stopped. Patient stated that since she relocated to [facility] she has not been on medication.  Patient is unsure whether she had a formal diagnosis of type I or type 2 diabetes. Patient stated over the past 2-3 weeks she has noticed worsening abdominal pain, nausea, polyuria, and polydipsia. Patient also reported she had approximately a 60 pound weight loss over the past 6 months. Patient stated that she has not had any medical care because of insurance.  Assessment/Plan:  DKA in uncontrolled DM type 1 due to medication noncompliance due to financial issues DKA is resolved, but CBGs remain very poorly controlled - transfer out of SDU, but need to keep in hospital until [facility] can be more reasonably controlled to prevent rapid decline back into DKA which could be life threatening   Hypotension This may well represent her normal BP - cont to follow trend - pt has been rehydrated and does not appear to be in distress - mobilize   Hypokalemia Replacing aggressively - due to cellular shift related to DKA/acidosis  Code Status: Full code Family Communication: None Disposition Plan: transfer to medical bed  DVT prophylaxis: Heparin  HPI/Subjective: Pt is alert and conversant.  She c/o itching "all over" with no new rash or lesions.  She denies n/v, chest pain, or abdom pain.     Objective: Blood pressure 96/57, pulse 65, temperature 98.6 F (37 C), temperature source Oral, resp. rate 21, height 5\' 7"  (1.702 m), weight 58.2 kg  (128 lb 4.9 oz), last menstrual period 07/21/2012, SpO2 98.00%.  Intake/Output Summary (Last 24 hours) at 10/23/12 1416 Last data filed at 10/23/12 1349  Gross per 24 hour  Intake   4220 ml  Output   5701 ml  Net  -1481 ml     Exam: General: No acute respiratory distress Lungs: Clear to auscultation bilaterally without wheezes or crackles Cardiovascular: Regular rate and rhythm without murmur gallop or rub normal S1 and S2 Abdomen: Nontender, nondistended, soft, bowel sounds positive, no rebound, no ascites, no appreciable mass Extremities: No significant cyanosis, clubbing, or edema bilateral lower extremities  Data Reviewed: Basic Metabolic Panel:  Lab 10/22/12 9562 10/22/12 1727 10/22/12 1440 10/22/12 1137 10/22/12 0832  NA 130* 130* 130* 132* 134*  K 3.6 3.4* 2.9* 3.0* 2.8*  CL 98 101 101 105 104  CO2 23 18* 18* 19 17*  GLUCOSE 323* 312* 244* 192* 134*  BUN 11 7 6 6 7   CREATININE 0.49* 0.43* 0.31* 0.23* 0.26*  CALCIUM 8.2* 7.9* 8.1* 7.7* 7.7*  MG -- -- -- -- --  PHOS -- -- -- -- --   Liver Function Tests:  Lab 10/21/12 1821  AST 13  ALT 14  ALKPHOS 93  BILITOT 0.3  PROT 7.3  ALBUMIN 3.6   CBC:  Lab 10/21/12 2239 10/21/12 1821  WBC 6.8 7.2  NEUTROABS -- 4.9  HGB 12.5 14.6  HCT 35.3* 41.3  MCV 87.4 88.4  PLT 157 173   CBG:  Lab 10/23/12 1125 10/23/12 0819 10/22/12 2110 10/22/12 1636 10/22/12 1329  GLUCAP 311* 295* >600*  313* 186*    Recent Results (from the past 240 hour(s))  MRSA PCR SCREENING     Status: Normal   Collection Time   10/21/12 10:29 PM      Component Value Range Status Comment   MRSA by PCR NEGATIVE  NEGATIVE Final      Studies:  Recent x-ray studies have been reviewed in detail by the Attending Physician  Scheduled Meds:  Reviewed in detail by the Attending Physician   Lonia Blood, MD [facility] Office  [phone] Pager [phone]  On-Call/Text Page:      [email or web address]      password [email or web address]  If 7PM-7AM,  please contact night-coverage [email or web address] Password [email or web address] 10/23/2012, 2:16 PM   LOS: 2 days

## 2012-10-24 ENCOUNTER — Inpatient Hospital Stay (HOSPITAL_COMMUNITY): Payer: Medicaid Other

## 2012-10-24 DIAGNOSIS — K0510 Chronic gingivitis, plaque induced: Secondary | ICD-10-CM | POA: Diagnosis present

## 2012-10-24 LAB — BASIC METABOLIC PANEL
BUN: 11 mg/dL (ref 6–23)
CO2: 29 mEq/L (ref 19–32)
Chloride: 105 mEq/L (ref 96–112)
GFR calc non Af Amer: >90 mL/min (ref >90)
Glucose, Bld: 193 mg/dL — ABNORMAL HIGH (ref 70–99)
Potassium: 3.2 mEq/L — ABNORMAL LOW (ref 3.5–5.1)
Sodium: 141 mEq/L (ref 135–145)

## 2012-10-24 LAB — GLUCOSE, CAPILLARY: Glucose-Capillary: 253 mg/dL — ABNORMAL HIGH (ref 70–99)

## 2012-10-24 MED ORDER — INSULIN ASPART PROT & ASPART (70-30 MIX) 100 UNIT/ML ~~LOC~~ SUSP
30.0000 [IU] | Freq: Two times a day (BID) | SUBCUTANEOUS | Status: DC
  Administered 2012-10-24 – 2012-10-25 (×2): 30 [IU] via SUBCUTANEOUS

## 2012-10-24 MED ORDER — "BD GETTING STARTED TAKE HOME KIT: 1ML X 30 G SYRINGES, "
1.0000 | Freq: Once | Status: DC
  Filled 2012-10-24: qty 1

## 2012-10-24 MED ORDER — BD GETTING STARTED TAKE HOME KIT: 1/2ML X 30G SYRINGES
1.0000 | Freq: Once | Status: DC
  Filled 2012-10-24: qty 1

## 2012-10-24 MED ORDER — POTASSIUM CHLORIDE CRYS ER 20 MEQ PO TBCR
40.0000 meq | EXTENDED_RELEASE_TABLET | Freq: Once | ORAL | Status: AC
  Administered 2012-10-24: 40 meq via ORAL
  Filled 2012-10-24: qty 2

## 2012-10-24 NOTE — Progress Notes (Signed)
[facility] PROGRESS NOTE  Fonnie Crookshanks Martinez-Vargas ZOX:[id number] DOB: Jan 29, 1991 DOA: 10/21/2012 PCP: No primary provider on file.  Assessment/Plan: Active Problems:  DKA, type 1  Hypotension  Hypokalemia    1. Uncontrolled DM type 1/DKA:  Patient was diagnosed with DM approximately 2 years ago, but has been non-compliant with medication, primarily due to financial issues. She presented with polydipsia/polyuria, and was found to have a random blood glucose of 735, CO2 14. Calculated AG was 20. She was managed with ivi insulin infusion per Glucostabilizer protocol, iv fluids, with satisfactory response and closure of anion gap, then successfully transitioned to SSI/scheduled insulin. CBGs are much improved, but still sub-optimally controlled. On Insulin (70/30), as this is more affordable, and request care manager input with regards to medication assistance and MD follow up. 2. Hypotension: At presentation, patient was dehydrated, volume-depleted, with a BP of 104/68. Despite adequate hydration, SBP has remained in the 90s, and patient appear to tolerate this well. This may well represent her baseline.  3. Hypokalemia:  Associated with DKA. Repleting as indicated.  4. Gingivitis: Patient has obvious gum swelling, predominantly in the upper jaw, and friability with some bleeding, over the past 4 months. Will check Panorex X-Ray. She will need dentist evaluation at some point.    Code Status: Full Code.  Family Communication:  Disposition Plan: To be determined.   Brief narrative: 22 y.o. year old female who presented with DKA. Patient was formally diagnosed with diabetes approximately 2 years ago in [place], was started on Metformin for treatment, took it for approximately 6 months and then stopped. Since she relocated to [facility], she has not been on medication. Patient is unsure whether she had a formal diagnosis of type I or type 2 diabetes. Over the past 2-3 weeks, she has  noticed worsening abdominal pain, nausea, polyuria, and polydipsia. She had approximately a 60 pound weight loss over the past 6 months. Patient stated that she has not had any medical care, because of insurance. Admitted for further management.    Consultants:  N/A.   Procedures:  N/A.   Antibiotics:  N/A.Marland Kitchen   HPI/Subjective: Has generalized aches, visual changes, headache.  Complains of gum swelling and bleeding for the past 4 months.   Objective: Vital signs in last 24 hours: Temp:  [97.6 F (36.4 C)-99 F (37.2 C)] 97.6 F (36.4 C) (02/04 0601) Pulse Rate:  [62-78] 65  (02/04 0601) Resp:  [13-21] 18  (02/04 0601) BP: (89-98)/(52-63) 91/52 mmHg (02/04 0601) SpO2:  [95 %-100 %] 98 % (02/04 0601) Weight change:  Last BM Date: 10/23/12  Intake/Output from previous day: 02/03 0701 - 02/04 0700 In: 1640 [P.O.:840; I.V.:800] Out: 2751 [Urine:2750; Stool:1]     Physical Exam: General: Comfortable, alert, communicative, fully oriented, not short of breath at rest.  HEENT:  No clinical pallor, no jaundice, no conjunctival injection or discharge. Has gingival hypertrophy in the upper jaw.  NECK:  Supple, JVP not seen, no carotid bruits, no palpable lymphadenopathy, no palpable goiter. CHEST:  Clinically clear to auscultation, no wheezes, no crackles. HEART:  Sounds 1 and 2 heard, normal, regular, no murmurs. ABDOMEN:  Flat, soft, non-tender, no palpable organomegaly, no palpable masses, normal bowel sounds. GENITALIA:  Not examined. LOWER EXTREMITIES:  No pitting edema, palpable peripheral pulses. MUSCULOSKELETAL SYSTEM:  Generalized osteoarthritic changes, otherwise, normal. CENTRAL NERVOUS SYSTEM:  No focal neurologic deficit on gross examination.  Lab Results:  [facility] 10/21/12 2239 10/21/12 1821  WBC 6.8 7.2  HGB 12.5  14.6  HCT 35.3* 41.3  PLT 157 173    Basename 10/24/12 0517 10/22/12 2158  NA 141 130*  K 3.2* 3.6  CL 105 98  CO2 29 23  GLUCOSE 193* 323*   BUN 11 11  CREATININE 0.39* 0.49*  CALCIUM 8.3* 8.2*   Recent Results (from the past 240 hour(s))  MRSA PCR SCREENING     Status: Normal   Collection Time   10/21/12 10:29 PM      Component Value Range Status Comment   MRSA by PCR NEGATIVE  NEGATIVE Final      Studies/Results: No results found.  Medications: Scheduled Meds:   . heparin  5,000 Units Subcutaneous Q8H  . insulin aspart  0-20 Units Subcutaneous TID WC  . insulin aspart  0-5 Units Subcutaneous QHS  . insulin aspart protamine-insulin aspart  26 Units Subcutaneous BID WC   Continuous Infusions:   . sodium chloride 75 mL/hr at 10/23/12 2204   PRN Meds:.acetaminophen, dextrose, diphenhydrAMINE, hydrOXYzine, ondansetron (ZOFRAN) IV    LOS: 3 days   Aubryana Vittorio,CHRISTOPHER  [facility] Pager [phone]. If 8PM-8AM, please contact night-coverage at [email or web address], password [facility] 10/24/2012, 7:25 AM  LOS: 3 days

## 2012-10-24 NOTE — Care Management Note (Signed)
  Page 1 of 1   10/25/2012     3:00:25 PM   CARE MANAGEMENT NOTE 10/25/2012  Patient:  Julie [name], Julie [name]   Account Number:  [id number]  Date Initiated:  10/24/2012  Documentation initiated by:  Ronny Flurry  Subjective/Objective Assessment:   DX: DKA     Action/Plan:   Anticipated DC Date:  10/25/2012   Anticipated DC Plan:  HOME/SELF CARE  In-house referral  Financial Counselor      DC Planning Services  [facility]      Choice offered to / List presented to:             Status of service:  Completed, signed off Medicare Important Message given?   (If response is "NO", the following Medicare IM given date fields will be blank) Date Medicare IM given:   Date Additional Medicare IM given:    Discharge Disposition:    Per UR Regulation:  Reviewed for med. necessity/level of care/duration of stay  If discussed at Long Length of Stay Meetings, dates discussed:    Comments:  10-25-12 Referral for PCP information including DR C. Laural Benes , list of PCP resources including DR Johnson's phone number and Urgent Care's Phone given to patient. Patient lives in [place] also included [facility]  Ronny Flurry RN BSN [phone]   10-25-11 Patient currently on Novolog 70/30 and Novolog  , assistance applications for both given and explained to patient . MATCH program letter also given and explained. Patient voiced understanding .   Ronny Flurry RN BSN [phone]

## 2012-10-24 NOTE — Progress Notes (Signed)
Diabetic teaching kit at bedside.

## 2012-10-24 NOTE — Progress Notes (Addendum)
Inpatient Diabetes Program Recommendations  AACE/ADA: New Consensus Statement on Inpatient Glycemic Control (2013)  Target Ranges:  Prepandial:   less than 140 mg/dL      Peak postprandial:   less than 180 mg/dL (1-2 hours)      Critically ill patients:  140 - 180 mg/dL   Reason for Visit: To talk with patient and educate at this point of diagnosis. Pt a bit tearful and scared of how to eat, how to give an injection, how to take care of herself. She also fears that the numbness in her hands and feet as well as her blurred vision are permanent. I assured her that these are typical symptoms when glucose has been high over the past few months. Will order an insulin starter kit with vial and syringe. Pt can practice drawing up with saline and can give her own injections when RN give her her insulin. Pt states she is so exhausted and still a bit nauseous (doubt ketones have totally cleared). Hopefully patient will get at least another full day here to learn how to check glucose, administer insulin, etc. Will revisit in the morning.   Note: Thank you, Lenor Coffin, RN, CNS, Diabetes Coordinator [phone])   Ad: Pt would be most appropriate for the health clinic by [facility] at the Urgent Care center.  Will try to call the office tomorrow to schedule an appt. Will inform pt of Reli-on Meter and strips. Pt states she has applied for Medicaid over 3 weeks ago and has heard nothing.  Will order case management consult.

## 2012-10-25 DIAGNOSIS — IMO0001 Reserved for inherently not codable concepts without codable children: Secondary | ICD-10-CM

## 2012-10-25 DIAGNOSIS — E1165 Type 2 diabetes mellitus with hyperglycemia: Secondary | ICD-10-CM

## 2012-10-25 DIAGNOSIS — IMO0002 Reserved for concepts with insufficient information to code with codable children: Secondary | ICD-10-CM

## 2012-10-25 DIAGNOSIS — E1110 Type 2 diabetes mellitus with ketoacidosis without coma: Secondary | ICD-10-CM

## 2012-10-25 LAB — COMPREHENSIVE METABOLIC PANEL
AST: 20 U/L (ref 0–37)
CO2: 28 mEq/L (ref 19–32)
Calcium: 8.6 mg/dL (ref 8.4–10.5)
Creatinine, Ser: 0.36 mg/dL — ABNORMAL LOW (ref 0.50–1.10)
GFR calc Af Amer: >90 mL/min (ref >90)
GFR calc non Af Amer: >90 mL/min (ref >90)
Glucose, Bld: 298 mg/dL — ABNORMAL HIGH (ref 70–99)
Total Protein: 5.7 g/dL — ABNORMAL LOW (ref 6.0–8.3)

## 2012-10-25 LAB — CBC
MCH: 31.4 pg (ref 26.0–34.0)
MCHC: 34.9 g/dL (ref 30.0–36.0)
MCV: 90.2 fL (ref 78.0–100.0)
Platelets: 151 10*3/uL (ref 150–400)
RBC: 4.07 MIL/uL (ref 3.87–5.11)

## 2012-10-25 LAB — GLUCOSE, CAPILLARY: Glucose-Capillary: 288 mg/dL — ABNORMAL HIGH (ref 70–99)

## 2012-10-25 MED ORDER — BD GETTING STARTED TAKE HOME KIT: 1/2ML X 30G SYRINGES
1.0000 | Freq: Once | Status: DC
  Administered 2012-10-25: 1

## 2012-10-25 MED ORDER — INSULIN ASPART PROT & ASPART (70-30 MIX) 100 UNIT/ML ~~LOC~~ SUSP
40.0000 [IU] | Freq: Two times a day (BID) | SUBCUTANEOUS | Status: DC
  Administered 2012-10-25 – 2012-10-26 (×2): 40 [IU] via SUBCUTANEOUS
  Filled 2012-10-25: qty 10

## 2012-10-25 NOTE — Progress Notes (Signed)
[facility] PROGRESS NOTE  Julie [name] ZOX:[id number] DOB: [birth year] DOA: 10/21/2012 PCP: No primary provider on file. patient is from [facility]. Hopefully [facility] will follow this patient, otherwise would suggest pt go to Urgent Care with Dr. Standley Dakins  Assessment/Plan: 1. Diabetes mellitus, uncontrolled, suspected type II: Control remains poor. Increased dose. Hemoglobin A1c 17.1. Recommended outpatient nutrition counseling. Continue education as an inpatient. 2. Status post DKA 3. Hypotension: At presentation, patient was dehydrated, volume-depleted, with a BP of 104/68. Despite adequate hydration, SBP has remained in the 90s, and patient appear to tolerate this well. This may well represent her baseline.  4. Hypokalemia: repleted. 5. Gingivitis: Unremarkable Panorex X-Ray. She will need dentist evaluation as an outpatient  Code Status: Full code Family Communication: None present Disposition Plan: Home when improved, one to 2 days.  Brendia Sacks, MD  [facility] Team 5 Pager [phone] If 7PM-7AM, please contact night-coverage at [email or web address], password [facility] 10/25/2012, 1:02 PM  LOS: 4 days   Brief narrative: 22 y.o. year old female with presenting with DKA. Patient was formally diagnosed with diabetes approximately 2 years ago in [place] per patient. Patient states she was started on metformin for treatment. Patient states that she took metformin for approximately 6 months and then stopped. Patient states that she been relocated to [place] has not been on medication since. Patient is unsure whether she had a formal diagnosis of type I or type 2 diabetes. Patient does report that her mother is on insulin. Otherwise no other family members with diabetes. Patient states over the past 2-3 weeks she has noticed worsening abdominal pain, nausea polyuria polydipsia. Patient also reports she had approximately a 60 pound weight loss over the past 6 months.  Patient states that she has not had any medical care because of insurance.  Patient was diagnosed with DM approximately 2 years ago, but has been non-compliant with medication, primarily due to financial issues. She presented with polydipsia/polyuria, and was found to have a random blood glucose of 735, CO2 14. Calculated AG was 20. She was managed with ivi insulin infusion per Glucostabilizer protocol, iv fluids, with satisfactory response and closure of anion gap, then successfully transitioned to SSI/scheduled insulin. CBGs are much improved, but still sub-optimally controlled. On Insulin (70/30), as this is more affordable, and request care manager input with regards to medication assistance and MD follow up.  Consultants:    Procedures:    HPI/Subjective: Afebrile, mild asymptomatic hypotension. Eating okay. Depressed.  Objective: Filed Vitals:   10/24/12 1747 10/24/12 2104 10/25/12 0207 10/25/12 0454  BP: 101/63 99/61 90/52  91/49  Pulse:  72 57 57  Temp: 98.1 F (36.7 C) 98.5 F (36.9 C) 98.4 F (36.9 C) 97.7 F (36.5 C)  TempSrc:  Oral    Resp: 16 18 17 17   Height:      Weight:      SpO2: 100% 95% 100% 100%    Intake/Output Summary (Last 24 hours) at 10/25/12 1302 Last data filed at 10/24/12 1749  Gross per 24 hour  Intake    240 ml  Output      0 ml  Net    240 ml   Filed Weights   10/21/12 1811 10/22/12 2023 10/23/12 0646  Weight: 53.978 kg (119 lb) 59.9 kg (132 lb 0.9 oz) 58.2 kg (128 lb 4.9 oz)    Exam:  General:  Appears calm and comfortable Cardiovascular: RRR, no m/r/g. No LE edema. Respiratory: CTA bilaterally, no w/r/r. Normal respiratory  effort. Musculoskeletal: grossly normal tone BUE/BLE Psychiatric: grossly normal mood and affect, speech fluent and appropriate  Data Reviewed: Basic Metabolic Panel:  Lab 10/25/12 1610 10/24/12 0517 10/22/12 2158 10/22/12 1727 10/22/12 1440  NA 135 141 130* 130* 130*  K 3.9 3.2* 3.6 3.4* 2.9*  CL 96 105 98  101 101  CO2 28 29 23  18* 18*  GLUCOSE 298* 193* 323* 312* 244*  BUN 17 11 11 7 6   CREATININE 0.36* 0.39* 0.49* 0.43* 0.31*  CALCIUM 8.6 8.3* 8.2* 7.9* 8.1*  MG -- -- -- -- --  PHOS -- -- -- -- --   Liver Function Tests:  Lab 10/25/12 0540 10/21/12 1821  AST 20 13  ALT 7 14  ALKPHOS 63 93  BILITOT 0.2* 0.3  PROT 5.7* 7.3  ALBUMIN 2.8* 3.6   CBC:  Lab 10/25/12 0540 10/21/12 2239 10/21/12 1821  WBC 5.9 6.8 7.2  NEUTROABS -- -- 4.9  HGB 12.8 12.5 14.6  HCT 36.7 35.3* 41.3  MCV 90.2 87.4 88.4  PLT 151 157 173   CBG:  Lab 10/24/12 2205 10/24/12 1637 10/24/12 1246 10/24/12 1217 10/24/12 0816  GLUCAP 253* 255* 172* 220* 177*    Recent Results (from the past 240 hour(s))  MRSA PCR SCREENING     Status: Normal   Collection Time   10/21/12 10:29 PM      Component Value Range Status Comment   MRSA by PCR NEGATIVE  NEGATIVE Final      Studies: Dg Orthopantogram  10/24/2012  *RADIOLOGY REPORT*  Clinical Data: Bleeding from the gums  ORTHOPANTOGRAM/PANORAMIC  Comparison: None.  Findings: No acute bony abnormality is seen.  The only questionable abnormality is some lucency around the roots of the right lower third molar of questionable significance but possibly indicating periodontal disease.  Also, there is slight irregularity of the bony contours of the mandible between the lower right second and third molars of questionable significance.  IMPRESSION: No acute abnormality.  Cannot exclude periodontal disease particularly involving the right lower third molar.   Original Report Authenticated By: Dwyane Dee, M.D.     Scheduled Meds:   . bd getting started take home kit  1 kit Other Once  . heparin  5,000 Units Subcutaneous Q8H  . insulin aspart  0-20 Units Subcutaneous TID WC  . insulin aspart  0-5 Units Subcutaneous QHS  . insulin aspart protamine-insulin aspart  30 Units Subcutaneous BID WC   Continuous Infusions:   Active Problems:  DKA, type 1  Hypotension   Hypokalemia  Gingivitis     Brendia Sacks, MD  [facility] Team 5 Pager [phone] If 7PM-7AM, please contact night-coverage at [email or web address], password [facility] 10/25/2012, 1:02 PM  LOS: 4 days   Time spent: 25 minutes

## 2012-10-25 NOTE — Progress Notes (Addendum)
Inpatient Diabetes Program Recommendations  AACE/ADA: New Consensus Statement on Inpatient Glycemic Control (2013)  Target Ranges:  Prepandial:   less than 140 mg/dL      Peak postprandial:   less than 180 mg/dL (1-2 hours)      Critically ill patients:  140 - 180 mg/dL   Reason for Visit: New onset education and counseling Discussed with patient her feelings as she stated that she is 'depressed'.  After talking for bit, she stated that she is so afraid that she cannot handle all of this. However, her biggest fear is that of complications that she may already have, i.e. Damaged eyes, pancreatic failure, damaged kidneys, etc.  Explained to her that she is newly diagnosed, and presently it is only a problem of cellular resistance to insulin; possibly making little or no insulin.  Explained to her the patho of diabetes, type 1 and 2. She feels overwhelmed at this point, so we could only discuss topics in intervals.  Will follow and continue to talk with her. She does appear extremely resistant to the insulin she is getting.  Given the fact that she has lost so much weight in the past few months and prior to that she was overweight, I feel strongly that she is type 2..However I told her that at this point, we do not know for sure.  Pt not yet able to give herself injections as she is fearful.  RN and NA's will assist her with self-checking glucose and to administer her own insulin. Gave her my contact numbers should she need help once discharged. Will order insulin starter kit in Spanish as well. Thank you, Lenor Coffin, RN, CNS, Diabetes Coordinator [phone])     Note: Thank you, Lenor Coffin, RN, CNS, Diabetes Coordinator [phone])  Ad.  Appears that pt needs in increase in her 70/30 insulin at this point as well.

## 2012-10-26 LAB — GLUCOSE, CAPILLARY
Glucose-Capillary: 215 mg/dL — ABNORMAL HIGH (ref 70–99)
Glucose-Capillary: 142 mg/dL — ABNORMAL HIGH (ref 70–99)

## 2012-10-26 MED ORDER — ZOLPIDEM TARTRATE 5 MG PO TABS
5.0000 mg | ORAL_TABLET | Freq: Every evening | ORAL | Status: DC | PRN
  Administered 2012-10-26: 5 mg via ORAL
  Filled 2012-10-26: qty 1

## 2012-10-26 MED ORDER — INSULIN ASPART PROT & ASPART (70-30 MIX) 100 UNIT/ML ~~LOC~~ SUSP
45.0000 [IU] | Freq: Two times a day (BID) | SUBCUTANEOUS | Status: DC
  Administered 2012-10-26 – 2012-10-27 (×3): 45 [IU] via SUBCUTANEOUS

## 2012-10-26 NOTE — Progress Notes (Addendum)
Inpatient Diabetes Program Recommendations  AACE/ADA: New Consensus Statement on Inpatient Glycemic Control (2013)  Target Ranges:  Prepandial:   less than 140 mg/dL      Peak postprandial:   less than 180 mg/dL (1-2 hours)      Critically ill patients:  140 - 180 mg/dL   Reason for Visit: Pt exhibits extreme insulin resistance Pt now on 40 units 70/30 started yesterday evening. Her cbg at HS last night was 154 mg/dL but her fasting glucose this am was 215 mg/dL. The dinner 70/30 however, does not last in action beyond 2-3 am, a time when the hormones causing reistance and when the liver is releasing glucose is at a high level. Therefore she has no active insulin for several hours during the early am hours. (3-7 hours) My suggestion would be to add HS NPH in order to cover her during that time.  Would start with 10 units NPH at bedtime. Pt still requiring high correction doses during the day as well.  Will need to continue to titrate the 70/30 and possibly the HS NPH until control is reached.  Pt related to me that she would normally eat a lot more than she is getting presently in the hospital. I explained to her that her body has not been getting fed for quite a few months indicated by her weight loss of 60 lbs and her A1C of 17%. Explained that consistent regular meals and snacks will satisfy her requirements. Pt would benefit from another RD consult, as she was extremely overwhelmed with their initial visits. Pt will need OP education and following once discharged as well.  Pt would benefit from being followed and educated by [facility]. Will continue to talk with patient and assist as needed.   Note: Thank you, Lenor Coffin, RN, CNS, Diabetes Coordinator [phone])  AD: Ordered case management consult to help pt determine if she is eligible for Medicaid. Case management has signed off with no mention of Medicaid.  Will call financial counselor to assist in determining status.   Called Artist, Frances Maywood to assess financial aid, i.e. Charity or Medicaid/medicare.  She will see patient and assist with this matter.

## 2012-10-26 NOTE — Progress Notes (Signed)
[facility] PROGRESS NOTE  Julie [name] ZOX:[id number] DOB: 1990-11-10 DOA: 10/21/2012 PCP: No primary provider on file.  Provided with resources for case management to establish with primary care physician.  Assessment/Plan: 1. Diabetes mellitus, uncontrolled, suspected type II: Control  much improved overall with increased dose 70/30 yesterday . Hemoglobin A1c 17.1. Recommended outpatient nutrition counseling. Continue education as an inpatient. 2. Status post DKA 3. Hypotension: At presentation, patient was dehydrated, volume-depleted, with a BP of 104/68. Despite adequate hydration, SBP has remained in the 90s, and patient appear to tolerate this well. This may well represent her baseline.  4. Hypokalemia: repleted. 5. Gingivitis: Unremarkable Panorex X-Ray. She will need dentist evaluation as an outpatient  Patient remains reluctant to self administer insulin, nurse has been working diligently with her today on this. Nurse will also show her how to check her blood sugar in attempt to encourage the patient to do it herself. Ideal control poses a challenge at this point and therefore would seek to minimize injections until she becomes more comfortable. I discussed with her how important it is to self administer insulin and to followup as an outpatient.  Code Status: Full code Family Communication: None present Disposition Plan: Home, likely 2/7  Brendia Sacks, MD  [facility] Team 5 Pager [phone] If 7PM-7AM, please contact night-coverage at [email or web address], password [facility] 10/26/2012, 2:16 PM  LOS: 5 days   Brief narrative: 22 y.o. year old female with presenting with DKA. Patient was formally diagnosed with diabetes approximately 2 years ago in [place] per patient. Patient states she was started on metformin for treatment. Patient states that she took metformin for approximately 6 months and then stopped. Patient states that she been relocated to [place] has  not been on medication since. Patient is unsure whether she had a formal diagnosis of type I or type 2 diabetes. Patient does report that her mother is on insulin. Otherwise no other family members with diabetes. Patient states over the past 2-3 weeks she has noticed worsening abdominal pain, nausea polyuria polydipsia. Patient also reports she had approximately a 60 pound weight loss over the past 6 months. Patient states that she has not had any medical care because of insurance.  Patient was diagnosed with DM approximately 2 years ago, but has been non-compliant with medication, primarily due to financial issues. She presented with polydipsia/polyuria, and was found to have a random blood glucose of 735, CO2 14. Calculated AG was 20. She was managed with ivi insulin infusion per Glucostabilizer protocol, iv fluids, with satisfactory response and closure of anion gap, then successfully transitioned to SSI/scheduled insulin. CBGs are much improved, but still sub-optimally controlled. On Insulin (70/30), as this is more affordable, and request care manager input with regards to medication assistance and MD follow up.  Consultants:    Procedures:    HPI/Subjective: No new issues. Still afraid to give herself an insulin injection.  Objective: Filed Vitals:   10/26/12 0100 10/26/12 0638 10/26/12 1000 10/26/12 1407  BP: 95/49 100/48 80/46 89/52   Pulse: 51 61 76 77  Temp: 97.9 F (36.6 C) 98.1 F (36.7 C) 98.2 F (36.8 C) 98.4 F (36.9 C)  TempSrc: Oral Oral Oral Oral  Resp: 16 17 18 18   Height:      Weight:      SpO2: 100% 100% 99% 98%    Intake/Output Summary (Last 24 hours) at 10/26/12 1416 Last data filed at 10/26/12 0900  Gross per 24 hour  Intake  240 ml  Output      0 ml  Net    240 ml   Filed Weights   10/21/12 1811 10/22/12 2023 10/23/12 0646  Weight: 53.978 kg (119 lb) 59.9 kg (132 lb 0.9 oz) 58.2 kg (128 lb 4.9 oz)    Exam:  General:  Appears calm and  comfortable Cardiovascular: RRR, no m/r/g. No LE edema. Respiratory: CTA bilaterally, no w/r/r. Normal respiratory effort. Psychiatric: grossly normal mood and affect, speech fluent and appropriate  Data Reviewed: Basic Metabolic Panel:  Lab 10/25/12 1610 10/24/12 0517 10/22/12 2158 10/22/12 1727 10/22/12 1440  NA 135 141 130* 130* 130*  K 3.9 3.2* 3.6 3.4* 2.9*  CL 96 105 98 101 101  CO2 28 29 23  18* 18*  GLUCOSE 298* 193* 323* 312* 244*  BUN 17 11 11 7 6   CREATININE 0.36* 0.39* 0.49* 0.43* 0.31*  CALCIUM 8.6 8.3* 8.2* 7.9* 8.1*  MG -- -- -- -- --  PHOS -- -- -- -- --   Liver Function Tests:  Lab 10/25/12 0540 10/21/12 1821  AST 20 13  ALT 7 14  ALKPHOS 63 93  BILITOT 0.2* 0.3  PROT 5.7* 7.3  ALBUMIN 2.8* 3.6   CBC:  Lab 10/25/12 0540 10/21/12 2239 10/21/12 1821  WBC 5.9 6.8 7.2  NEUTROABS -- -- 4.9  HGB 12.8 12.5 14.6  HCT 36.7 35.3* 41.3  MCV 90.2 87.4 88.4  PLT 151 157 173   CBG:  Lab 10/26/12 1159 10/26/12 0749 10/25/12 2223 10/25/12 1703 10/25/12 1156  GLUCAP 142* 215* 154* 208* 290*    Recent Results (from the past 240 hour(s))  MRSA PCR SCREENING     Status: Normal   Collection Time   10/21/12 10:29 PM      Component Value Range Status Comment   MRSA by PCR NEGATIVE  NEGATIVE Final      Studies: No results found.  Scheduled Meds:    . bd getting started take home kit  1 kit Other Once  . heparin  5,000 Units Subcutaneous Q8H  . insulin aspart  0-20 Units Subcutaneous TID WC  . insulin aspart  0-5 Units Subcutaneous QHS  . insulin aspart protamine-insulin aspart  40 Units Subcutaneous BID WC   Continuous Infusions:   Principal Problem:  *Diabetes mellitus type 2, uncontrolled Active Problems:  Hypotension  Hypokalemia  Gingivitis  DKA (diabetic ketoacidoses)     Brendia Sacks, MD  [facility] Team 5 Pager [phone] If 7PM-7AM, please contact night-coverage at [email or web address], password [facility] 10/26/2012, 2:16 PM  LOS: 5 days    Time spent: 20 minutes

## 2012-10-27 LAB — GLUCOSE, CAPILLARY: Glucose-Capillary: 211 mg/dL — ABNORMAL HIGH (ref 70–99)

## 2012-10-27 MED ORDER — INSULIN ASPART PROT & ASPART (70-30 MIX) 100 UNIT/ML ~~LOC~~ SUSP: 45.0000 [IU] | Freq: Two times a day (BID) | SUBCUTANEOUS | Status: DC

## 2012-10-27 MED ORDER — ZOLPIDEM TARTRATE 5 MG PO TABS: 5.0000 mg | ORAL_TABLET | Freq: Every evening | ORAL | Status: DC | PRN

## 2012-10-27 MED ORDER — INSULIN NPH ISOPHANE & REGULAR (70-30) 100 UNIT/ML ~~LOC~~ SUSP: 45.0000 [IU] | Freq: Two times a day (BID) | SUBCUTANEOUS | Status: DC

## 2012-10-27 NOTE — Progress Notes (Signed)
[facility] PROGRESS NOTE  Anaika Santillano Martinez-Vargas ZOX:[id number] DOB: [birth year] DOA: 10/21/2012 PCP: No primary provider on file.  Provided with resources for case management to establish with primary care physician. Patient lives in [facility] and would prefer to follow up with a physician there.  Assessment/Plan: 1. Diabetes mellitus, uncontrolled, suspected type II: Control  much improved overall with increased dose 70/30. Hemoglobin A1c 17.1. Recommended outpatient nutrition counseling.  2. Status post DKA 3. Hypotension: Borderline. Asymptomatic. At presentation, patient was dehydrated, volume-depleted, with a BP of 104/68. Despite adequate hydration, SBP has remained in the 90s, and patient appear to tolerate this well. This is likely her baseline..  4. Hypokalemia: repleted. 5. Gingivitis: Unremarkable Panorex X-Ray. She will need dentist evaluation as an outpatient  Patient now checking blood sugars herself. She will self administer insulin prior to discharge. I have again discussed the importance of establishing with primary care physician of her choice (she prefers [facility]) and the necessity of taking insulin. We checking blood sugars daily and recording in a note book. She is provided daily education and reinforcement of teaching. Maximal inpatient benefit has been reached. She will need close outpatient followup and titration of insulin. Ideally would pursue sliding scale insulin with meals as well however at this point she will not do this.  Code Status: Full code Family Communication: None present Disposition Plan: Home  Brendia Sacks, MD  [facility] Team 5 Pager [phone] If 7PM-7AM, please contact night-coverage at [email or web address], password [facility] 10/27/2012, 12:45 PM  LOS: 6 days   Brief narrative: 22 y.o. year old female with presenting with DKA. Patient was formally diagnosed with diabetes approximately 2 years ago in [place] per patient. Patient states she  was started on metformin for treatment. Patient states that she took metformin for approximately 6 months and then stopped. Patient states that she been relocated to [place] has not been on medication since. Patient is unsure whether she had a formal diagnosis of type I or type 2 diabetes. Patient does report that her mother is on insulin. Otherwise no other family members with diabetes. Patient states over the past 2-3 weeks she has noticed worsening abdominal pain, nausea polyuria polydipsia. Patient also reports she had approximately a 60 pound weight loss over the past 6 months. Patient states that she has not had any medical care because of insurance.  Patient was diagnosed with DM approximately 2 years ago, but has been non-compliant with medication, primarily due to financial issues. She presented with polydipsia/polyuria, and was found to have a random blood glucose of 735, CO2 14. Calculated AG was 20. She was managed with ivi insulin infusion per Glucostabilizer protocol, iv fluids, with satisfactory response and closure of anion gap, then successfully transitioned to SSI/scheduled insulin. CBGs are much improved, but still sub-optimally controlled. On Insulin (70/30), as this is more affordable, and request care manager input with regards to medication assistance and MD follow up.  Consultants:    Procedures:    HPI/Subjective: Overall feels better. Some numbness in bilateral feet. Eating fine. Blurry vision improved today.  Objective: Filed Vitals:   10/26/12 1739 10/26/12 2159 10/27/12 0510 10/27/12 0933  BP: 88/51 89/51 84/53  77/45  Pulse: 75 69 79 66  Temp: 98.4 F (36.9 C) 98 F (36.7 C) 97.7 F (36.5 C) 97.6 F (36.4 C)  TempSrc: Oral Oral  Oral  Resp: 16 17 18 17   Height:      Weight:      SpO2: 100% 100%  97% 98%   No intake or output data in the 24 hours ending 10/27/12 1245 Filed Weights   10/21/12 1811 10/22/12 2023 10/23/12 0646  Weight: 53.978 kg (119  lb) 59.9 kg (132 lb 0.9 oz) 58.2 kg (128 lb 4.9 oz)    Exam:  General:  Appears calm and comfortable Cardiovascular: RRR, no m/r/g. No LE edema. Respiratory: CTA bilaterally, no w/r/r. Normal respiratory effort. Psychiatric: grossly normal mood and affect, speech fluent and appropriate Musculoskeletal: Both feet appear unremarkable including the toes bilaterally with no lesions. Decreased sensation in toes bilaterally. I performed a manual blood pressure: 90/50 with a cuff to large for the patient.  Data Reviewed: Basic Metabolic Panel:  Lab 10/25/12 9811 10/24/12 0517 10/22/12 2158 10/22/12 1727 10/22/12 1440  NA 135 141 130* 130* 130*  K 3.9 3.2* 3.6 3.4* 2.9*  CL 96 105 98 101 101  CO2 28 29 23  18* 18*  GLUCOSE 298* 193* 323* 312* 244*  BUN 17 11 11 7 6   CREATININE 0.36* 0.39* 0.49* 0.43* 0.31*  CALCIUM 8.6 8.3* 8.2* 7.9* 8.1*  MG -- -- -- -- --  PHOS -- -- -- -- --   Liver Function Tests:  Lab 10/25/12 0540 10/21/12 1821  AST 20 13  ALT 7 14  ALKPHOS 63 93  BILITOT 0.2* 0.3  PROT 5.7* 7.3  ALBUMIN 2.8* 3.6   CBC:  Lab 10/25/12 0540 10/21/12 2239 10/21/12 1821  WBC 5.9 6.8 7.2  NEUTROABS -- -- 4.9  HGB 12.8 12.5 14.6  HCT 36.7 35.3* 41.3  MCV 90.2 87.4 88.4  PLT 151 157 173   CBG:  Lab 10/27/12 1206 10/27/12 0754 10/26/12 2144 10/26/12 1701 10/26/12 1159  GLUCAP 167* 195* 147* 164* 142*    Recent Results (from the past 240 hour(s))  MRSA PCR SCREENING     Status: Normal   Collection Time   10/21/12 10:29 PM      Component Value Range Status Comment   MRSA by PCR NEGATIVE  NEGATIVE Final      Studies: No results found.  Scheduled Meds:    . bd getting started take home kit  1 kit Other Once  . heparin  5,000 Units Subcutaneous Q8H  . insulin aspart  0-20 Units Subcutaneous TID WC  . insulin aspart  0-5 Units Subcutaneous QHS  . insulin aspart protamine-insulin aspart  45 Units Subcutaneous BID WC   Continuous Infusions:   Principal Problem:   *Diabetes mellitus type 2, uncontrolled Active Problems:  Hypotension  Hypokalemia  Gingivitis  DKA (diabetic ketoacidoses)     Brendia Sacks, MD  [facility] Team 5 Pager [phone] If 7PM-7AM, please contact night-coverage at [email or web address], password [facility] 10/27/2012, 12:45 PM  LOS: 6 days

## 2012-10-27 NOTE — Discharge Summary (Addendum)
Physician Discharge Summary  Julie [name] ZOX:[id number] DOB: 1991/03/30 DOA: 10/21/2012  PCP: No primary provider on file. Provided with resources for case management to establish with primary care physician. Patient lives in [facility] and would prefer to follow up with a physician there.  Admit date: 10/21/2012 Discharge date: 10/27/2012  Recommendations for Outpatient Follow-up:  1. Followup uncontrolled diabetes mellitus, continue to reinforce diet and teaching.  Follow-up Information    Follow up with Primary care physician of your choice. In 1 week.        Discharge Diagnoses:  1. Diabetic ketoacidosis 2. Diabetes mellitus uncontrolled  Discharge Condition: Improved Disposition: Home  Diet recommendation: Carb modified diet  Filed Weights   10/21/12 1811 10/22/12 2023 10/23/12 0646  Weight: 53.978 kg (119 lb) 59.9 kg (132 lb 0.9 oz) 58.2 kg (128 lb 4.9 oz)    History of present illness:  22 y.o. year old female with presenting with DKA. Patient was formally diagnosed with diabetes approximately 2 years ago in [place] per patient. Patient states she was started on metformin for treatment. Patient states that she took metformin for approximately 6 months and then stopped. Patient states that she been relocated to [place] has not been on medication since. Patient is unsure whether she had a formal diagnosis of type I or type 2 diabetes. Patient does report that her mother is on insulin. Otherwise no other family members with diabetes. Patient states over the past 2-3 weeks she has noticed worsening abdominal pain, nausea polyuria polydipsia. Patient also reports she had approximately a 60 pound weight loss over the past 6 months. Patient states that she has not had any medical care because of insurance.  Hospital Course:  Ms. Julie [name] presented with polydipsia/polyuria, and was found to have a random blood glucose of 735, CO2 14. Calculated AG was 20. She was  managed with ivi insulin infusion per Glucostabilizer protocol, iv fluids, with satisfactory response and closure of anion gap, then successfully transitioned to SSI/scheduled insulin. CBGs are much improved. On Insulin (70/30), as this is more affordable. Currently blood sugars much better controlled. She has had extensive teaching from nursing as well as diabetic nurse coordinator and nutrition. She is also been provided resources for establishing primary care physician which she plans to do in [facility]. I have repeatedly stressed to her the importance of establishing a primary care physician, checking blood sugars regularly and taking insulin as directed.  1. Diabetes mellitus, uncontrolled, suspected type II: Control  much improved overall with increased dose 70/30. Hemoglobin A1c 17.1. Recommended outpatient nutrition counseling.    2. Status post DKA  3. Hypotension: Borderline. Asymptomatic. At presentation, patient was dehydrated, volume-depleted, with a BP of 104/68. Despite adequate hydration, SBP has remained in the 90s, and patient appear to tolerate this well. This is likely her baseline..    4. Hypokalemia: repleted.  5. Gingivitis: Unremarkable Panorex X-Ray. She will need dentist evaluation as an outpatient  Patient now checking blood sugars herself. She will self administer insulin prior to discharge. I have again discussed the importance of establishing with primary care physician of her choice (she prefers [facility]) and the necessity of taking insulin, checking blood sugars daily and recording in a note book. She has been provided daily education and reinforcement of teaching. Maximal inpatient benefit has been reached. She will need close outpatient followup and titration of insulin. Ideally would pursue sliding scale insulin with meals as well however at this point she will not  do this.  Consultants:  None  Procedures: None  Discharge Instructions  Discharge Orders     Future Orders Please Complete By Expires   Diet Carb Modified      Discharge instructions      Comments:   It is very important to establish with primary care physician of your choice in one week. Over time your insulin dosage will need to be changed depending on your response. Continue to check your blood sugars at least once per day on a rotating basis (breakfast one day, lunch the next day, dinner the next day, before bed the next day, repeat). Checking 2-3 times a day would be even better. Call your physician or seek immediate medical attention for persistent blood sugar greater than 400, persistent blood sugar less than 70, or worsening of condition. Please schedule an eye exam in the near future. Be sure to always wear shoes even around the house.   Activity as tolerated - No restrictions      Consult diabetes OP education          Medication List     As of 10/27/2012  1:58 PM    TAKE these medications         insulin NPH-insulin regular (70-30) 100 UNIT/ML injection   Commonly known as: NOVOLIN 70/30   Inject 45 Units into the skin 2 (two) times daily with a meal.      zolpidem 5 MG tablet   Commonly known as: AMBIEN   Take 1 tablet (5 mg total) by mouth at bedtime as needed for sleep.          The results of significant diagnostics from this hospitalization (including imaging, microbiology, ancillary and laboratory) are listed below for reference.    Significant Diagnostic Studies: Dg Orthopantogram  10/24/2012  *RADIOLOGY REPORT*  Clinical Data: Bleeding from the gums  ORTHOPANTOGRAM/PANORAMIC  Comparison: None.  Findings: No acute bony abnormality is seen.  The only questionable abnormality is some lucency around the roots of the right lower third molar of questionable significance but possibly indicating periodontal disease.  Also, there is slight irregularity of the bony contours of the mandible between the lower right second and third molars of questionable significance.   IMPRESSION: No acute abnormality.  Cannot exclude periodontal disease particularly involving the right lower third molar.   Original Report Authenticated By: Dwyane Dee, M.D.     Microbiology: Recent Results (from the past 240 hour(s))  MRSA PCR SCREENING     Status: Normal   Collection Time   10/21/12 10:29 PM      Component Value Range Status Comment   MRSA by PCR NEGATIVE  NEGATIVE Final      Labs: Basic Metabolic Panel:  Lab 10/25/12 1610 10/24/12 0517 10/22/12 2158 10/22/12 1727 10/22/12 1440  NA 135 141 130* 130* 130*  K 3.9 3.2* 3.6 3.4* 2.9*  CL 96 105 98 101 101  CO2 28 29 23  18* 18*  GLUCOSE 298* 193* 323* 312* 244*  BUN 17 11 11 7 6   CREATININE 0.36* 0.39* 0.49* 0.43* 0.31*  CALCIUM 8.6 8.3* 8.2* 7.9* 8.1*  MG -- -- -- -- --  PHOS -- -- -- -- --   Liver Function Tests:  Lab 10/25/12 0540 10/21/12 1821  AST 20 13  ALT 7 14  ALKPHOS 63 93  BILITOT 0.2* 0.3  PROT 5.7* 7.3  ALBUMIN 2.8* 3.6   CBC:  Lab 10/25/12 0540 10/21/12 2239 10/21/12 1821  WBC 5.9 6.8  7.2  NEUTROABS -- -- 4.9  HGB 12.8 12.5 14.6  HCT 36.7 35.3* 41.3  MCV 90.2 87.4 88.4  PLT 151 157 173   CBG:  Lab 10/27/12 1206 10/27/12 0754 10/26/12 2144 10/26/12 1701 10/26/12 1159  GLUCAP 167* 195* 147* 164* 142*    Principal Problem:  *Diabetes mellitus type 2, uncontrolled Active Problems:  Hypotension  Hypokalemia  Gingivitis  DKA (diabetic ketoacidoses)   Time coordinating discharge: 35 minutes  Signed:  Brendia Sacks, MD [facility] 10/27/2012, 1:21 PM

## 2012-10-27 NOTE — Progress Notes (Signed)
Patient discharged to home with friend.  Discharge teaching completed including follow up care, medications, diet and insulin administration.  Verbalizes understanding with no further questions at this time.  Diabetic teaching information given to patient.  Verbalizes understanding of importance of checking blood sugar daily and administering insulin as ordered.  Patient returned demonstration of checking blood sugar, and drew up and administered insulin on her own.  Vital signs stable.  States she will purchase glucose monitor at discharge.  Verbalizes no further questions at this time.  Ambulating ad lib.  Discharged with friend.

## 2013-02-23 ENCOUNTER — Inpatient Hospital Stay (HOSPITAL_BASED_OUTPATIENT_CLINIC_OR_DEPARTMENT_OTHER)
Admission: EM | Admit: 2013-02-23 | Discharge: 2013-02-28 | DRG: 638 | Disposition: A | Discharge status: Home / Self Care | Payer: Medicaid Other | Attending: Internal Medicine | Admitting: Internal Medicine

## 2013-02-23 ENCOUNTER — Encounter (HOSPITAL_BASED_OUTPATIENT_CLINIC_OR_DEPARTMENT_OTHER): Payer: Self-pay | Admitting: Student

## 2013-02-23 DIAGNOSIS — R29898 Other symptoms and signs involving the musculoskeletal system: Secondary | ICD-10-CM | POA: Diagnosis present

## 2013-02-23 DIAGNOSIS — L089 Local infection of the skin and subcutaneous tissue, unspecified: Secondary | ICD-10-CM

## 2013-02-23 DIAGNOSIS — Z794 Long term (current) use of insulin: Secondary | ICD-10-CM

## 2013-02-23 DIAGNOSIS — E1065 Type 1 diabetes mellitus with hyperglycemia: Secondary | ICD-10-CM | POA: Diagnosis present

## 2013-02-23 DIAGNOSIS — E1010 Type 1 diabetes mellitus with ketoacidosis without coma: Secondary | ICD-10-CM | POA: Diagnosis present

## 2013-02-23 DIAGNOSIS — E1110 Type 2 diabetes mellitus with ketoacidosis without coma: Secondary | ICD-10-CM

## 2013-02-23 DIAGNOSIS — L080 Pyoderma: Secondary | ICD-10-CM | POA: Diagnosis present

## 2013-02-23 DIAGNOSIS — L0231 Cutaneous abscess of buttock: Secondary | ICD-10-CM | POA: Diagnosis present

## 2013-02-23 DIAGNOSIS — K0510 Chronic gingivitis, plaque induced: Secondary | ICD-10-CM

## 2013-02-23 DIAGNOSIS — E1142 Type 2 diabetes mellitus with diabetic polyneuropathy: Secondary | ICD-10-CM | POA: Diagnosis present

## 2013-02-23 DIAGNOSIS — E876 Hypokalemia: Secondary | ICD-10-CM | POA: Diagnosis present

## 2013-02-23 DIAGNOSIS — L03317 Cellulitis of buttock: Secondary | ICD-10-CM | POA: Diagnosis present

## 2013-02-23 DIAGNOSIS — E1049 Type 1 diabetes mellitus with other diabetic neurological complication: Secondary | ICD-10-CM | POA: Diagnosis present

## 2013-02-23 DIAGNOSIS — E1165 Type 2 diabetes mellitus with hyperglycemia: Secondary | ICD-10-CM

## 2013-02-23 DIAGNOSIS — L738 Other specified follicular disorders: Secondary | ICD-10-CM | POA: Diagnosis present

## 2013-02-23 DIAGNOSIS — L678 Other hair color and hair shaft abnormalities: Secondary | ICD-10-CM | POA: Diagnosis present

## 2013-02-23 DIAGNOSIS — R21 Rash and other nonspecific skin eruption: Secondary | ICD-10-CM

## 2013-02-23 LAB — COMPREHENSIVE METABOLIC PANEL
ALT: 7 U/L (ref 0–35)
AST: 7 U/L (ref 0–37)
Albumin: 3.6 g/dL (ref 3.5–5.2)
Alkaline Phosphatase: 135 U/L — ABNORMAL HIGH (ref 39–117)
GFR calc Af Amer: >90 mL/min (ref >90)
Glucose, Bld: 767 mg/dL (ref 70–99)
Potassium: 3.4 mEq/L — ABNORMAL LOW (ref 3.5–5.1)
Sodium: 127 mEq/L — ABNORMAL LOW (ref 135–145)
Total Protein: 7.3 g/dL (ref 6.0–8.3)

## 2013-02-23 LAB — GLUCOSE, CAPILLARY
Glucose-Capillary: >600 mg/dL (ref 70–99)
Glucose-Capillary: 508 mg/dL — ABNORMAL HIGH (ref 70–99)

## 2013-02-23 LAB — CBC WITH DIFFERENTIAL/PLATELET
Basophils Absolute: 0 10*3/uL (ref 0.0–0.1)
HCT: 42.5 % (ref 36.0–46.0)
Hemoglobin: 15 g/dL (ref 12.0–15.0)
Lymphocytes Relative: 16 % (ref 12–46)
Monocytes Relative: 9 % (ref 3–12)
Neutro Abs: 6.3 10*3/uL (ref 1.7–7.7)
RDW: 14.6 % (ref 11.5–15.5)
WBC: 8.6 10*3/uL (ref 4.0–10.5)

## 2013-02-23 LAB — BASIC METABOLIC PANEL
Calcium: 7.7 mg/dL — ABNORMAL LOW (ref 8.4–10.5)
Creatinine, Ser: 0.3 mg/dL — ABNORMAL LOW (ref 0.50–1.10)
GFR calc non Af Amer: >90 mL/min (ref >90)
Glucose, Bld: 282 mg/dL — ABNORMAL HIGH (ref 70–99)
Sodium: 135 mEq/L (ref 135–145)

## 2013-02-23 LAB — POCT I-STAT 3, VENOUS BLOOD GAS (G3P V)
Acid-base deficit: 17 mmol/L — ABNORMAL HIGH (ref 0.0–2.0)
Bicarbonate: 10.7 mEq/L — ABNORMAL LOW (ref 20.0–24.0)
O2 Saturation: 62 %
TCO2: 12 mmol/L (ref 0–100)
pO2, Ven: 41 mmHg (ref 30.0–45.0)

## 2013-02-23 LAB — URINALYSIS, ROUTINE W REFLEX MICROSCOPIC
Glucose, UA: >1000 mg/dL — AB
Leukocytes, UA: NEGATIVE
pH: 5.5 (ref 5.0–8.0)

## 2013-02-23 LAB — URINE MICROSCOPIC-ADD ON

## 2013-02-23 MED ORDER — IBUPROFEN 200 MG PO TABS
200.0000 mg | ORAL_TABLET | Freq: Once | ORAL | Status: AC
  Administered 2013-02-23: 200 mg via ORAL
  Filled 2013-02-23 (×2): qty 1

## 2013-02-23 MED ORDER — SODIUM CHLORIDE 0.9 % IV SOLN
INTRAVENOUS | Status: DC
  Administered 2013-02-23: 5.4 [IU]/h via INTRAVENOUS
  Filled 2013-02-23: qty 1

## 2013-02-23 MED ORDER — POTASSIUM CHLORIDE 10 MEQ/100ML IV SOLN
10.0000 meq | INTRAVENOUS | Status: AC
  Administered 2013-02-23 (×4): 10 meq via INTRAVENOUS
  Filled 2013-02-23: qty 400

## 2013-02-23 MED ORDER — DEXTROSE-NACL 5-0.45 % IV SOLN: INTRAVENOUS | Status: DC

## 2013-02-23 MED ORDER — VANCOMYCIN HCL 10 G IV SOLR
1250.0000 mg | Freq: Two times a day (BID) | INTRAVENOUS | Status: DC
  Administered 2013-02-24 – 2013-02-27 (×7): 1250 mg via INTRAVENOUS
  Filled 2013-02-23 (×9): qty 1250

## 2013-02-23 MED ORDER — INSULIN REGULAR BOLUS VIA INFUSION
0.0000 [IU] | Freq: Three times a day (TID) | INTRAVENOUS | Status: DC
  Administered 2013-02-23: 5.4 [IU] via INTRAVENOUS
  Filled 2013-02-23: qty 10

## 2013-02-23 MED ORDER — HYDROMORPHONE HCL PF 1 MG/ML IJ SOLN
INTRAMUSCULAR | Status: AC
  Filled 2013-02-23: qty 1

## 2013-02-23 MED ORDER — HYDROMORPHONE HCL PF 1 MG/ML IJ SOLN
1.0000 mg | Freq: Once | INTRAMUSCULAR | Status: AC
  Administered 2013-02-23: 1 mg via INTRAVENOUS

## 2013-02-23 MED ORDER — DEXTROSE 50 % IV SOLN
25.0000 mL | INTRAVENOUS | Status: DC | PRN
  Administered 2013-02-23: 13:00:00 via INTRAVENOUS
  Filled 2013-02-23: qty 50

## 2013-02-23 MED ORDER — SODIUM CHLORIDE 0.9 % IV SOLN: 1000.0000 mL | Freq: Once | INTRAVENOUS | Status: DC

## 2013-02-23 MED ORDER — SODIUM CHLORIDE 0.9 % IV SOLN
1000.0000 mL | Freq: Once | INTRAVENOUS | Status: AC
  Administered 2013-02-23: 1000 mL via INTRAVENOUS

## 2013-02-23 MED ORDER — SODIUM CHLORIDE 0.9 % IV SOLN
1000.0000 mL | INTRAVENOUS | Status: DC
  Administered 2013-02-24 – 2013-02-25 (×2): 1000 mL via INTRAVENOUS

## 2013-02-23 MED ORDER — ONDANSETRON HCL 4 MG/2ML IJ SOLN
4.0000 mg | Freq: Four times a day (QID) | INTRAMUSCULAR | Status: DC | PRN
  Administered 2013-02-23 – 2013-02-27 (×4): 4 mg via INTRAVENOUS
  Filled 2013-02-23 (×4): qty 2

## 2013-02-23 MED ORDER — MAGNESIUM SULFATE IN D5W 10-5 MG/ML-% IV SOLN
1.0000 g | Freq: Once | INTRAVENOUS | Status: AC
  Administered 2013-02-23: 1 g via INTRAVENOUS
  Filled 2013-02-23: qty 100

## 2013-02-23 MED ORDER — INSULIN REGULAR HUMAN 100 UNIT/ML IJ SOLN
INTRAMUSCULAR | Status: AC
  Filled 2013-02-23: qty 1

## 2013-02-23 MED ORDER — SODIUM CHLORIDE 0.9 % IV SOLN
INTRAVENOUS | Status: DC
  Administered 2013-02-23 (×2): via INTRAVENOUS

## 2013-02-23 MED ORDER — METOCLOPRAMIDE HCL 5 MG/ML IJ SOLN
5.0000 mg | Freq: Three times a day (TID) | INTRAMUSCULAR | Status: DC | PRN
  Administered 2013-02-27: 5 mg via INTRAVENOUS
  Filled 2013-02-23: qty 1

## 2013-02-23 MED ORDER — ACETAMINOPHEN 325 MG PO TABS
650.0000 mg | ORAL_TABLET | Freq: Four times a day (QID) | ORAL | Status: DC | PRN
  Administered 2013-02-23 – 2013-02-24 (×2): 650 mg via ORAL
  Filled 2013-02-23 (×2): qty 2

## 2013-02-23 MED ORDER — INSULIN REGULAR HUMAN 100 UNIT/ML IJ SOLN
INTRAMUSCULAR | Status: AC
  Administered 2013-02-23: 10 [IU]
  Filled 2013-02-23: qty 1

## 2013-02-23 MED ORDER — VANCOMYCIN HCL IN DEXTROSE 1-5 GM/200ML-% IV SOLN
1000.0000 mg | Freq: Once | INTRAVENOUS | Status: AC
  Administered 2013-02-23: 1000 mg via INTRAVENOUS
  Filled 2013-02-23: qty 200

## 2013-02-23 MED ORDER — POTASSIUM CHLORIDE CRYS ER 20 MEQ PO TBCR
40.0000 meq | EXTENDED_RELEASE_TABLET | Freq: Once | ORAL | Status: AC
  Administered 2013-02-23: 40 meq via ORAL
  Filled 2013-02-23: qty 2

## 2013-02-23 NOTE — ED Notes (Signed)
Ash to lower back and lower torso, legs. Red raised areas noted with white center.

## 2013-02-23 NOTE — Progress Notes (Signed)
Pt arrived to 2605 from EMS; Pt alert and oriented; VSS; pain 6 out of 10; pt complains of nausea/vomiting; tele attached; MD notified of arrival; will continue to monitor.

## 2013-02-23 NOTE — ED Notes (Signed)
Karen Sofia, PA-C at bedside 

## 2013-02-23 NOTE — ED Notes (Signed)
Correction to previous medication documentation D50 scanned, however not administer.  Orders clarified with [place], PA-C.  Insulin 10 units IVP administered and CBG ordered.

## 2013-02-23 NOTE — ED Notes (Signed)
PO fluids provided. 

## 2013-02-23 NOTE — ED Notes (Signed)
[facility] at bedside preparing for transport 

## 2013-02-23 NOTE — Plan of Care (Signed)
Called by [facility] from [facility] ED, Ms. Julie [name], 22 year old female presented with DKA, has been out of insulin for several weeks. She was admitted in February with DKA as well and has not seen any M.D. after the discharge for followup. She also complained of a diffuse rash somewhat pustular, ? MRSA. UA positive for ketones ABG venous PH 7.1 with PCO2 of 31. Bicarb on metabolic profile 11. Patient accepted to step down unit, team 10, insulin drip. EDP got wound cx sent.     Sumaiya Arruda M.D. [facility] 02/23/2013, 2:27 PM  Pager: [phone]

## 2013-02-23 NOTE — ED Notes (Signed)
Report called to unit RN. Correction to medication and IVF total- administered and #4 NS bag started PTA at 150 ml/hr.  Insulin R 10 units IVP administered and IV insulin gtt initiated with glucomander.

## 2013-02-23 NOTE — Progress Notes (Signed)
ANTIBIOTIC CONSULT NOTE - INITIAL  Pharmacy Consult for Vancomycin Indication:  Cellulitis  No Known Allergies  Patient Measurements: Height: 5\' 10"  (177.8 cm) Weight: 134 lb 11.2 oz (61.1 kg) IBW/kg (Calculated) : 68.5  Vital Signs: Temp: 98.2 F (36.8 C) (06/06 1656) Temp src: Oral (06/06 1656) BP: 101/68 mmHg (06/06 1656) Pulse Rate: 88 (06/06 1656)  Labs:  Recent Labs  02/23/13 1145  WBC 8.6  HGB 15.0  PLT 145*  CREATININE 0.40*   Estimated Creatinine Clearance: 106.4 ml/min (by C-G formula based on Cr of 0.4).  Medical History: Past Medical History  Diagnosis Date  . Diabetes mellitus without complication    Medications:  Anti-infectives   Start     Dose/Rate Route Frequency Ordered Stop   02/23/13 1230  vancomycin (VANCOCIN) IVPB 1000 mg/200 mL premix     1,000 mg 200 mL/hr over 60 Minutes Intravenous  Once 02/23/13 1227 02/23/13 1410     Assessment: 22 yo female admitted for skin rash and pain.  She has diabetes and has not had her insulin in several days and now with DKA.  She has a diffuse rash which she states is painful and we have been consulted to initiate IV Vancomycin for potential MRSA infection.  She has normal renal function with a creatinine of 0.4 and an estimated clearance of 100 ml/min.   She has mild leukocytosis with a WBC of 8.6K.  She is currently afebrile and she has no known allergies.  She received one dose of Vancomycin 1gm. around 12:30PM today.  Goal of Therapy:  Vancomycin trough level 10-15 mcg/ml  Plan:  1).  Will begin Vancomycin 1250 mg IV every 12 hours. 2).  Monitor renal function and s/s levels, adjusting as needed. 3).  F/U culture data and match antibiotics as well.   Nadara Mustard, PharmD., MS Clinical Pharmacist Pager:  [phone] Thank you for allowing pharmacy to be part of this patients care team.  02/23/2013,5:11 PM

## 2013-02-23 NOTE — H&P (Signed)
[facility] History and Physical  Julie [name]:[id number] DOB: [birth year] DOA: 02/23/2013  Referring physician: ED PCP: none    Chief Complaint:  Chief Complaint  Patient presents with  . Diabetic Ketoacidosis     HPI: [name] is a 22 y.o. female with hx of DM1, off insulin for at least 1 month presented to the ED today with c/o skin rash over her back, buttocks - which is painful and associated with fever. Also reports nausea and inability to eat for a few days. She was found to be in dka and referred for admission.    Review of Systems: The patient denies weight loss,, vision loss, decreased hearing, hoarseness, chest pain, syncope, dyspnea on exertion, peripheral edema, balance deficits, hemoptysis, abdominal pain, melena, hematochezia, hematuria, incontinence, genital sores, muscle weakness,transient blindness, difficulty walking, depression, unusual weight change, abnormal bleeding, enlarged lymph nodes, angioedema, and breast masses.    Past Medical History  Diagnosis Date  . Diabetes mellitus without complication    Past Surgical History  Procedure Laterality Date  . Cesarean section    . Cesarean section  2012   Social History:  reports that she has never smoked. She does not have any smokeless tobacco history on file. She reports that she does not drink alcohol or use illicit drugs.   No Known Allergies  Family History  Problem Relation Age of Onset  . Diabetes Mother     Prior to Admission medications   Medication Sig Start Date End Date Taking? Authorizing Provider  insulin NPH-insulin regular (NOVOLIN 70/30) (70-30) 100 UNIT/ML injection Inject 45 Units into the skin 2 (two) times daily with a meal. 10/27/12   Standley Brooking, MD  metFORMIN (GLUCOPHAGE) 500 MG tablet Take 500 mg by mouth 2 (two) times daily with a meal.    Historical Provider, MD  zolpidem (AMBIEN) 5 MG tablet Take 1 tablet (5 mg total) by mouth at bedtime  as needed for sleep. 10/27/12   Standley Brooking, MD   Physical Exam: Filed Vitals:   02/23/13 1320 02/23/13 1431 02/23/13 1511 02/23/13 1656  BP:  93/52 101/54 101/68  Pulse: 95 92 80 88  Temp:  98.4 F (36.9 C) 98.2 F (36.8 C) 98.2 F (36.8 C)  TempSrc:  Oral Oral Oral  Resp: 16 14 16 17   Height:    5\' 10"  (1.778 m)  Weight:    61.1 kg (134 lb 11.2 oz)  SpO2: 100% 100% 100% 100%     General:  Lethargic, ill appearing  Eyes: Injected conjunctiva,  ENT: Dry oral mucosa  Neck: No JVD  Cardiovascular: Regular without murmurs  Respiratory: Clear to auscultation bilaterally  Abdomen: Soft nontender bowel sounds present  Skin: Multiple pustular lesions over the back and buttocks, no frank abscess  Musculoskeletal: Intact  Psychiatric: Lethargic  Neurologic: Grossly intact  Labs on Admission:  Basic Metabolic Panel:  Recent Labs Lab 02/23/13 1145  NA 127*  K 3.4*  CL 93*  CO2 11*  GLUCOSE 767*  BUN 10  CREATININE 0.40*  CALCIUM 8.9   Liver Function Tests:  Recent Labs Lab 02/23/13 1145  AST 7  ALT 7  ALKPHOS 135*  BILITOT 0.4  PROT 7.3  ALBUMIN 3.6   No results found for this basename: LIPASE, AMYLASE,  in the last 168 hours No results found for this basename: AMMONIA,  in the last 168 hours CBC:  Recent Labs Lab 02/23/13 1145  WBC 8.6  NEUTROABS 6.3  HGB  15.0  HCT 42.5  MCV 88.4  PLT 145*   Cardiac Enzymes: No results found for this basename: CKTOTAL, CKMB, CKMBINDEX, TROPONINI,  in the last 168 hours  BNP (last 3 results) No results found for this basename: PROBNP,  in the last 8760 hours CBG:  Recent Labs Lab 02/23/13 1130 02/23/13 1305 02/23/13 1405 02/23/13 1506 02/23/13 1645  GLUCAP >600* >600* 508* 422* 284*    Radiological Exams on Admission: No results found.    Assessment/Plan Active Problems:   DKA, type 1   Hypokalemia   Pustular rash   1. Diabetic ketoacidosis from lack of insulin-started on IV  fluids and IV insulin, follow basic metabolic profile as his CBGs closely. Transition to long acting insulin and short-acting insulin once acidosis corrected 2. Pustular rash-highly suspicious for MRSA infection. Doubt bacteremia but it would be prudent to check blood cultures. Wound cultures has been sent, start vancomycin IV per pharmacy Hypokalemia from DKA-check also magnesium and phosphorus-replete and follow  Aradhana Gin [facility] Pager [phone]  If 7PM-7AM, please contact night-coverage [email or web address] Password [email or web address] 02/23/2013, 5:11 PM

## 2013-02-23 NOTE — ED Provider Notes (Signed)
Medical screening examination/treatment/procedure(s) were conducted as a shared visit with non-physician practitioner(s) and myself.  I personally evaluated the patient during the encounter Pt with muliplte superficial skin abscesses, DKA.  Started on glucostabilizer and abx.  Transferred to [employer].  Rolan Bucco, MD 02/23/13 [phone]

## 2013-02-23 NOTE — ED Notes (Signed)
Insulin gtt rate changed per protocol.  Langston Masker, PA-C at bedside for wound culture

## 2013-02-23 NOTE — ED Provider Notes (Signed)
History     CSN: [id number]  Arrival date & time 02/23/13  1046   First MD Initiated Contact with Patient 02/23/13 1212      Chief Complaint  Patient presents with  . Rash    (Consider location/radiation/quality/duration/timing/severity/associated sxs/prior treatment) HPI Comments: Pt reports she is out of insulin.  Pt reports she has not had insulin for several weeks.  No MD.   Pt reports she was admitted to hospital in feb and has not seen a Physician since.  Pt complains of pain from skin rash and feeling bad over all  Patient is a 22 y.o. female presenting with rash. The history is provided by the patient. No language interpreter was used.  Rash Pain location:  Generalized Pain quality: aching and burning   Pain severity:  Moderate Onset quality:  Gradual Timing:  Constant Ineffective treatments:  None tried Associated symptoms: nausea     Past Medical History  Diagnosis Date  . Diabetes mellitus without complication     Past Surgical History  Procedure Laterality Date  . Cesarean section    . Cesarean section  2012    No family history on file.  History  Substance Use Topics  . Smoking status: Never Smoker   . Smokeless tobacco: Not on file  . Alcohol Use: No    OB History   Grav Para Term Preterm Abortions TAB SAB Ect Mult Living                  Review of Systems  Gastrointestinal: Positive for nausea.  Skin: Positive for rash.    Allergies  Review of patient's allergies indicates no known allergies.  Home Medications   Current Outpatient Rx  Name  Route  Sig  Dispense  Refill  . metFORMIN (GLUCOPHAGE) 500 MG tablet   Oral   Take 500 mg by mouth 2 (two) times daily with a meal.         . insulin NPH-insulin regular (NOVOLIN 70/30) (70-30) 100 UNIT/ML injection   Subcutaneous   Inject 45 Units into the skin 2 (two) times daily with a meal.   10 mL   1   . zolpidem (AMBIEN) 5 MG tablet   Oral   Take 1 tablet (5 mg total) by mouth at  bedtime as needed for sleep.   30 tablet   0     BP 125/77  Pulse 102  Temp(Src) 98.4 F (36.9 C) (Oral)  Resp 20  Wt 122 lb (55.339 kg)  BMI 19.1 kg/m2  SpO2 100%  LMP 02/22/2013  Physical Exam  Nursing note and vitals reviewed. Constitutional: She appears well-developed and well-nourished.  HENT:  Head: Normocephalic and atraumatic.  Eyes: Conjunctivae and EOM are normal. Pupils are equal, round, and reactive to light.  Neck: Normal range of motion. Neck supple.  Cardiovascular: Normal heart sounds.   tachycardic  Pulmonary/Chest: Effort normal.  Abdominal: Soft.  Musculoskeletal: Normal range of motion.  Neurological: She is alert.  Skin: There is erythema.  Multiple scattered round red abscesses,  Various stages of infection and healing,   Looks like mrsa  Psychiatric: She has a normal mood and affect.    ED Course  CRITICAL CARE Performed by: Elson Areas Authorized by: Elson Areas Total critical care time: 30 minutes Critical care time was exclusive of separately billable procedures and treating other patients. Critical care was necessary to treat or prevent imminent or life-threatening deterioration of the following conditions: metabolic crisis and  dehydration. Critical care was time spent personally by me on the following activities: evaluation of patient's response to treatment, examination of patient, obtaining history from patient or surrogate, ordering and performing treatments and interventions, ordering and review of laboratory studies, re-evaluation of patient's condition, review of old charts and pulse oximetry.   (including critical care time)  Labs Reviewed  GLUCOSE, CAPILLARY - Abnormal; Notable for the following:    Glucose-Capillary >600 (*)    All other components within normal limits  URINALYSIS, ROUTINE W REFLEX MICROSCOPIC - Abnormal; Notable for the following:    Specific Gravity, Urine 1.036 (*)    Glucose, UA >1000 (*)    Hgb  urine dipstick TRACE (*)    Ketones, ur >80 (*)    All other components within normal limits  CBC WITH DIFFERENTIAL - Abnormal; Notable for the following:    Platelets 145 (*)    All other components within normal limits  URINE MICROSCOPIC-ADD ON - Abnormal; Notable for the following:    Bacteria, UA FEW (*)    All other components within normal limits  POCT I-STAT 3, BLOOD GAS (G3P V) - Abnormal; Notable for the following:    pH, Ven 7.145 (*)    pCO2, Ven 31.2 (*)    Bicarbonate 10.7 (*)    Acid-base deficit 17.0 (*)    All other components within normal limits  PREGNANCY, URINE  COMPREHENSIVE METABOLIC PANEL   No results found.   1. DKA (diabetic ketoacidoses)   2. Diabetes mellitus type 2, uncontrolled   3. Skin infection       MDM    Pt started on IV fluids, 2 liters bolus,   Insulin per glu commander, vancomycin started for skin infection,   I suspect mrsa,   Dilaudid 1mg  Iv     Glucose decreased to 508 with fluids and insulin 10 units iv,   Pt reports pain relief with dilaudid.    Lonia Skinner [place], PA-C 02/23/13 1408  Lonia Skinner [place], PA-C 02/23/13 1447

## 2013-02-23 NOTE — Progress Notes (Signed)
CRITICAL VALUE ALERT  Critical value received:  K 2.5  Date of notification:  02/23/2013  Time of notification:  1844  Critical value read back:yes  Nurse who received alert:  Scharlene Gloss, RN  MD notified (1st page):  Dr Lavera Guise  Time of first page:  1845   MD notified (2nd page):  Time of second page:  Responding MD:  Dr Lavera Guise  Time MD responded:  [phone]

## 2013-02-24 DIAGNOSIS — IMO0001 Reserved for inherently not codable concepts without codable children: Secondary | ICD-10-CM

## 2013-02-24 DIAGNOSIS — K0510 Chronic gingivitis, plaque induced: Secondary | ICD-10-CM

## 2013-02-24 LAB — BASIC METABOLIC PANEL
BUN: 5 mg/dL — ABNORMAL LOW (ref 6–23)
CO2: 19 mEq/L (ref 19–32)
Calcium: 7.8 mg/dL — ABNORMAL LOW (ref 8.4–10.5)
Calcium: 8.1 mg/dL — ABNORMAL LOW (ref 8.4–10.5)
Chloride: 107 mEq/L (ref 96–112)
Chloride: 105 mEq/L (ref 96–112)
Creatinine, Ser: 0.3 mg/dL — ABNORMAL LOW (ref 0.50–1.10)
Creatinine, Ser: 0.32 mg/dL — ABNORMAL LOW (ref 0.50–1.10)
GFR calc Af Amer: >90 mL/min (ref >90)
GFR calc Af Amer: >90 mL/min (ref >90)
GFR calc non Af Amer: >90 mL/min (ref >90)
GFR calc non Af Amer: >90 mL/min (ref >90)
Glucose, Bld: 231 mg/dL — ABNORMAL HIGH (ref 70–99)
Potassium: 2.9 mEq/L — ABNORMAL LOW (ref 3.5–5.1)
Potassium: 2.8 mEq/L — ABNORMAL LOW (ref 3.5–5.1)
Potassium: 2.5 mEq/L — CL (ref 3.5–5.1)
Sodium: 136 mEq/L (ref 135–145)
Sodium: 134 mEq/L — ABNORMAL LOW (ref 135–145)
Sodium: 132 mEq/L — ABNORMAL LOW (ref 135–145)

## 2013-02-24 LAB — GLUCOSE, CAPILLARY
Glucose-Capillary: 111 mg/dL — ABNORMAL HIGH (ref 70–99)
Glucose-Capillary: 131 mg/dL — ABNORMAL HIGH (ref 70–99)
Glucose-Capillary: 187 mg/dL — ABNORMAL HIGH (ref 70–99)
Glucose-Capillary: 191 mg/dL — ABNORMAL HIGH (ref 70–99)
Glucose-Capillary: 192 mg/dL — ABNORMAL HIGH (ref 70–99)
Glucose-Capillary: 125 mg/dL — ABNORMAL HIGH (ref 70–99)
Glucose-Capillary: 122 mg/dL — ABNORMAL HIGH (ref 70–99)
Glucose-Capillary: 145 mg/dL — ABNORMAL HIGH (ref 70–99)
Glucose-Capillary: 216 mg/dL — ABNORMAL HIGH (ref 70–99)

## 2013-02-24 MED ORDER — POTASSIUM CHLORIDE 10 MEQ/100ML IV SOLN
10.0000 meq | INTRAVENOUS | Status: AC
  Administered 2013-02-24 (×4): 10 meq via INTRAVENOUS
  Filled 2013-02-24: qty 300
  Filled 2013-02-24: qty 100

## 2013-02-24 MED ORDER — HYDROCODONE-ACETAMINOPHEN 5-325 MG PO TABS
1.0000 | ORAL_TABLET | ORAL | Status: DC | PRN
  Administered 2013-02-24 – 2013-02-28 (×14): 2 via ORAL
  Filled 2013-02-24 (×14): qty 2

## 2013-02-24 MED ORDER — MAGNESIUM SULFATE 40 MG/ML IJ SOLN
2.0000 g | Freq: Once | INTRAMUSCULAR | Status: AC
  Administered 2013-02-24: 2 g via INTRAVENOUS
  Filled 2013-02-24: qty 50

## 2013-02-24 MED ORDER — INSULIN ASPART 100 UNIT/ML ~~LOC~~ SOLN
6.0000 [IU] | Freq: Once | SUBCUTANEOUS | Status: AC
  Administered 2013-02-24: 6 [IU] via SUBCUTANEOUS

## 2013-02-24 MED ORDER — INSULIN ASPART 100 UNIT/ML ~~LOC~~ SOLN
0.0000 [IU] | Freq: Every day | SUBCUTANEOUS | Status: DC
  Administered 2013-02-24 – 2013-02-25 (×2): 5 [IU] via SUBCUTANEOUS
  Administered 2013-02-26: 3 [IU] via SUBCUTANEOUS

## 2013-02-24 MED ORDER — POTASSIUM CHLORIDE CRYS ER 20 MEQ PO TBCR
40.0000 meq | EXTENDED_RELEASE_TABLET | ORAL | Status: AC
  Administered 2013-02-24 (×2): 40 meq via ORAL
  Filled 2013-02-24 (×2): qty 2

## 2013-02-24 MED ORDER — NON FORMULARY: 40.0000 [IU] | Freq: Two times a day (BID) | Status: DC

## 2013-02-24 MED ORDER — INSULIN ASPART PROT & ASPART (70-30 MIX) 100 UNIT/ML ~~LOC~~ SUSP
40.0000 [IU] | Freq: Two times a day (BID) | SUBCUTANEOUS | Status: DC
  Administered 2013-02-24 – 2013-02-25 (×2): 40 [IU] via SUBCUTANEOUS
  Filled 2013-02-24: qty 10

## 2013-02-24 NOTE — Progress Notes (Signed)
[facility] Progress Note [place] TEAM 1 - Stepdown/ICU TEAM   Julie [name] WUJ:[id number] DOB: [birth year] DOA: 02/23/2013 PCP: No primary provider on file.  Brief narrative: Julie [name] is a 22 y.o. female with hx of DM1, off insulin for at least 1 month presented to the ED  with c/o skin rash over her back, buttocks - which is painful and associated with fever. Also reports nausea and inability to eat for a few days. She was found to be in dka and referred for admission.    Assessment/Plan: Principal Problem:   DKA, type 1 - unable to afford medications - will need Reli-on brand on d/c -resume 70/30 now that DKA resolved - emphasized strictly the need for her to be compliant and the complications that can occur   Active Problems:   Hypokalemia - cont to replace and follow    Pustular rash - cont Vanc for today- likely staph infection    Code Status: full Family Communication: none Disposition Plan: follow for d/c tomorrow  Consultants: none  Procedures: none  Antibiotics: Vancomycin  DVT prophylaxis: scds   HPI/Subjective: Pt alert, c/o headache and numbness in left leg. Rash on back is painful.    Objective: Blood pressure 97/63, pulse 70, temperature 98 F (36.7 C), temperature source Oral, resp. rate 16, height 5\' 10"  (1.778 m), weight 61.1 kg (134 lb 11.2 oz), last menstrual period 02/22/2013, SpO2 100.00%.  Intake/Output Summary (Last 24 hours) at 02/24/13 1427 Last data filed at 02/24/13 0800  Gross per 24 hour  Intake 2427.12 ml  Output    100 ml  Net 2327.12 ml     Exam: General: No acute respiratory distress Lungs: Clear to auscultation bilaterally without wheezes or crackles Cardiovascular: Regular rate and rhythm without murmur gallop or rub normal S1 and S2 Abdomen: Nontender, nondistended, soft, bowel sounds positive, no rebound, no ascites, no appreciable mass Extremities: No significant cyanosis,  clubbing, or edema bilateral lower extremities Skin- indurated pustules noted on lower back/ buttocks and thighs- not currently draining  Data Reviewed: Basic Metabolic Panel:  Recent Labs Lab 02/23/13 1145 02/23/13 1739 02/23/13 2305 02/24/13 0615 02/24/13 0905 02/24/13 1245  NA 127* 135 130* 136 134* 132*  K 3.4* 2.5* 2.6* 2.9* 2.8* 2.5*  CL 93* 105 101 107 105 102  CO2 11* 15* 17* 21 21 19   GLUCOSE 767* 282* 231* 117* 138* 209*  BUN 10 6 5* 4* 4* 4*  CREATININE 0.40* 0.30* 0.30* 0.32* 0.32* 0.34*  CALCIUM 8.9 7.7* 7.8* 8.3* 8.1* 8.1*  MG  --  1.5  --   --   --   --   PHOS  --  1.5*  --   --   --   --    Liver Function Tests:  Recent Labs Lab 02/23/13 1145  AST 7  ALT 7  ALKPHOS 135*  BILITOT 0.4  PROT 7.3  ALBUMIN 3.6   No results found for this basename: LIPASE, AMYLASE,  in the last 168 hours No results found for this basename: AMMONIA,  in the last 168 hours CBC:  Recent Labs Lab 02/23/13 1145  WBC 8.6  NEUTROABS 6.3  HGB 15.0  HCT 42.5  MCV 88.4  PLT 145*   Cardiac Enzymes: No results found for this basename: CKTOTAL, CKMB, CKMBINDEX, TROPONINI,  in the last 168 hours BNP (last 3 results) No results found for this basename: PROBNP,  in the last 8760 hours CBG:  Recent Labs Lab 02/24/13  1610 02/24/13 0846 02/24/13 0949 02/24/13 1052 02/24/13 1220  GLUCAP 125* 122* 145* 173* 216*    Recent Results (from the past 240 hour(s))  WOUND CULTURE     Status: None   Collection Time    02/23/13  2:10 PM      Result Value Range Status   Specimen Description LEG   Final   Special Requests Normal   Final   Gram Stain PENDING   Incomplete   Culture Culture reincubated for better growth   Final   Report Status PENDING   Incomplete  MRSA PCR SCREENING     Status: None   Collection Time    02/23/13  5:16 PM      Result Value Range Status   MRSA by PCR NEGATIVE  NEGATIVE Final   Comment:            The GeneXpert MRSA Assay (FDA     approved for  NASAL specimens     only), is one component of a     comprehensive MRSA colonization     surveillance program. It is not     intended to diagnose MRSA     infection nor to guide or     monitor treatment for     MRSA infections.  CULTURE, BLOOD (ROUTINE X 2)     Status: None   Collection Time    02/23/13  5:30 PM      Result Value Range Status   Specimen Description BLOOD LEFT ARM   Final   Special Requests BOTTLES DRAWN AEROBIC AND ANAEROBIC 10CC   Final   Culture  Setup Time 02/23/2013 23:29   Final   Culture     Final   Value:        BLOOD CULTURE RECEIVED NO GROWTH TO DATE CULTURE WILL BE HELD FOR 5 DAYS BEFORE ISSUING A FINAL NEGATIVE REPORT   Report Status PENDING   Incomplete  CULTURE, BLOOD (ROUTINE X 2)     Status: None   Collection Time    02/23/13  5:40 PM      Result Value Range Status   Specimen Description BLOOD LEFT ARM   Final   Special Requests BOTTLES DRAWN AEROBIC AND ANAEROBIC 10CC   Final   Culture  Setup Time 02/23/2013 23:29   Final   Culture     Final   Value:        BLOOD CULTURE RECEIVED NO GROWTH TO DATE CULTURE WILL BE HELD FOR 5 DAYS BEFORE ISSUING A FINAL NEGATIVE REPORT   Report Status PENDING   Incomplete     Studies:  Recent x-ray studies have been reviewed in detail by the Attending Physician  Scheduled Meds:  Scheduled Meds: . sodium chloride  1,000 mL Intravenous Once  . insulin aspart  0-15 Units Subcutaneous Q lunch  . insulin aspart protamine- aspart  40 Units Subcutaneous BID WC  . potassium chloride  40 mEq Oral Q4H  . vancomycin  1,250 mg Intravenous Q12H   Continuous Infusions: . sodium chloride      Time spent on care of this patient: 35 min   Calvert Cantor, MD [phone]  [facility] Office  [phone] Pager - Text Page per Amion as per below:  On-Call/Text Page:      [email or web address]      password [email or web address]  If 7PM-7AM, please contact night-coverage [email or web address] Password [email or web address] 02/24/2013, 2:27 PM   LOS: 1 day

## 2013-02-24 NOTE — Progress Notes (Signed)
CRITICAL VALUE ALERT  Critical value received:  K 2.5  Date of notification:  02/23/13  Time of notification:  1332  Critical value read back:yes  Nurse who received alert:  Burnard Hawthorne RN  MD notified (1st page):  Dr. Butler Denmark  Time of first page:  1332  MD notified (2nd page):  Time of second page:  Responding MD:   Butler Denmark   Time MD responded:   1335

## 2013-02-25 DIAGNOSIS — R21 Rash and other nonspecific skin eruption: Secondary | ICD-10-CM

## 2013-02-25 DIAGNOSIS — R29898 Other symptoms and signs involving the musculoskeletal system: Secondary | ICD-10-CM | POA: Diagnosis present

## 2013-02-25 DIAGNOSIS — L089 Local infection of the skin and subcutaneous tissue, unspecified: Secondary | ICD-10-CM

## 2013-02-25 DIAGNOSIS — E119 Type 2 diabetes mellitus without complications: Secondary | ICD-10-CM

## 2013-02-25 LAB — BASIC METABOLIC PANEL
BUN: 6 mg/dL (ref 6–23)
CO2: 24 mEq/L (ref 19–32)
CO2: 24 mEq/L (ref 19–32)
Calcium: 8.4 mg/dL (ref 8.4–10.5)
Chloride: 106 mEq/L (ref 96–112)
Chloride: 105 mEq/L (ref 96–112)
Creatinine, Ser: 0.29 mg/dL — ABNORMAL LOW (ref 0.50–1.10)
Glucose, Bld: 225 mg/dL — ABNORMAL HIGH (ref 70–99)
Glucose, Bld: 277 mg/dL — ABNORMAL HIGH (ref 70–99)
Potassium: 2.9 mEq/L — ABNORMAL LOW (ref 3.5–5.1)
Potassium: 3.5 mEq/L (ref 3.5–5.1)
Sodium: 137 mEq/L (ref 135–145)

## 2013-02-25 LAB — GLUCOSE, CAPILLARY
Glucose-Capillary: 206 mg/dL — ABNORMAL HIGH (ref 70–99)
Glucose-Capillary: 224 mg/dL — ABNORMAL HIGH (ref 70–99)
Glucose-Capillary: 220 mg/dL — ABNORMAL HIGH (ref 70–99)

## 2013-02-25 MED ORDER — INSULIN ASPART PROT & ASPART (70-30 MIX) 100 UNIT/ML ~~LOC~~ SUSP
44.0000 [IU] | Freq: Two times a day (BID) | SUBCUTANEOUS | Status: DC
  Administered 2013-02-25 – 2013-02-27 (×4): 44 [IU] via SUBCUTANEOUS
  Filled 2013-02-25 (×2): qty 10

## 2013-02-25 MED ORDER — POTASSIUM CHLORIDE CRYS ER 20 MEQ PO TBCR
40.0000 meq | EXTENDED_RELEASE_TABLET | Freq: Once | ORAL | Status: AC
  Administered 2013-02-25: 40 meq via ORAL
  Filled 2013-02-25: qty 2

## 2013-02-25 MED ORDER — CHLORHEXIDINE GLUCONATE 4 % EX LIQD
Freq: Every day | CUTANEOUS | Status: DC
  Administered 2013-02-25 – 2013-02-28 (×4): via TOPICAL
  Filled 2013-02-25 (×3): qty 15

## 2013-02-25 MED ORDER — ZOLPIDEM TARTRATE 5 MG PO TABS: 5.0000 mg | ORAL_TABLET | Freq: Every evening | ORAL | Status: DC | PRN

## 2013-02-25 MED ORDER — POTASSIUM CHLORIDE IN NACL 20-0.45 MEQ/L-% IV SOLN
INTRAVENOUS | Status: DC
  Administered 2013-02-25: 06:00:00 via INTRAVENOUS
  Filled 2013-02-25 (×3): qty 1000

## 2013-02-25 MED ORDER — DOUBLE ANTIBIOTIC 500-10000 UNIT/GM EX OINT
TOPICAL_OINTMENT | Freq: Two times a day (BID) | CUTANEOUS | Status: DC
  Administered 2013-02-25 – 2013-02-26 (×4): via TOPICAL
  Administered 2013-02-27: 1 via TOPICAL
  Administered 2013-02-28: 10:00:00 via TOPICAL
  Filled 2013-02-25 (×5): qty 1
  Filled 2013-02-25: qty 15
  Filled 2013-02-25 (×13): qty 1

## 2013-02-25 MED ORDER — MORPHINE SULFATE 2 MG/ML IJ SOLN
2.0000 mg | INTRAMUSCULAR | Status: DC | PRN
  Administered 2013-02-25 – 2013-02-28 (×9): 2 mg via INTRAVENOUS
  Filled 2013-02-25 (×9): qty 1

## 2013-02-25 NOTE — Consult Note (Signed)
Reason for Consult:LLE weakness Referring Physician: Rizwan  CC: LLE weakness   HPI: Julie [name] is an 22 y.o. female admitted for DKA who reported today that for the past 2 weeks she has noticed LLE weakness.  The patient is a poor historian but reports that about 2 weeks ago she noted that she was having difficulty walking.  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  She was able to remain ambulatory.  Reports that she awakened with the symptoms one day and they did not resolve.  She has had no back pain.  No bowel or bladder incontinence.  No history of trauma.  Past Medical History  Diagnosis Date  . Diabetes mellitus without complication     Past Surgical History  Procedure Laterality Date  . Cesarean section    . Cesarean section  2012    Family History  Problem Relation Age of Onset  . Diabetes Mother     Social History:  reports that she has never smoked. She does not have any smokeless tobacco history on file. She reports that she does not drink alcohol or use illicit drugs.  No Known Allergies  Medications:  I have reviewed the patient's current medications. Prior to Admission:  Prescriptions prior to admission  Medication Sig Dispense Refill  . insulin NPH-insulin regular (NOVOLIN 70/30) (70-30) 100 UNIT/ML injection Inject 45 Units into the skin 2 (two) times daily with a meal.  10 mL  1  . metFORMIN (GLUCOPHAGE) 500 MG tablet Take 500 mg by mouth 2 (two) times daily with a meal.       Scheduled: . chlorhexidine   Topical Daily  . insulin aspart  0-15 Units Subcutaneous Q lunch  . insulin aspart protamine- aspart  44 Units Subcutaneous BID WC  . polymixin-bacitracin   Topical BID  . vancomycin  1,250 mg Intravenous Q12H    ROS: History obtained from the patient  General ROS: negative for - chills, fatigue, fever, night sweats, weight gain or weight loss Psychological ROS: negative for - behavioral disorder, hallucinations,  memory difficulties, mood swings or suicidal ideation Ophthalmic ROS: negative for - blurry vision, double vision, eye pain or loss of vision ENT ROS: negative for - epistaxis, nasal discharge, oral lesions, sore throat, tinnitus or vertigo Allergy and Immunology ROS: negative for - hives or itchy/watery eyes Hematological and Lymphatic ROS: negative for - bleeding problems, bruising or swollen lymph nodes Endocrine ROS: negative for - galactorrhea, hair pattern changes, polydipsia/polyuria or temperature intolerance Respiratory ROS: negative for - cough, hemoptysis, shortness of breath or wheezing Cardiovascular ROS: negative for - chest pain, dyspnea on exertion, edema or irregular heartbeat Gastrointestinal ROS: negative for - abdominal pain, diarrhea, hematemesis, nausea/vomiting or stool incontinence Genito-Urinary ROS: dysuria Musculoskeletal ROS: negative for - joint swelling or muscular weakness Neurological ROS: as noted in HPI Dermatological ROS: negative for rash and skin lesion changes  Physical Examination: Blood pressure 92/53, pulse 85, temperature 98.8 F (37.1 C), temperature source Oral, resp. rate 16, height 5\' 10"  (1.778 m), weight 61.1 kg (134 lb 11.2 oz), last menstrual period 02/22/2013, SpO2 100.00%.  Neurologic Examination Mental Status: Alert, oriented, thought content appropriate.  Speech fluent without evidence of aphasia.  Able to follow 3 step commands without difficulty. Cranial Nerves: II: Discs flat bilaterally; Visual fields grossly normal, pupils equal, round, reactive to light and accommodation III,IV, VI: ptosis not present, extra-ocular motions intact bilaterally V,VII: smile symmetric, facial light touch sensation  normal bilaterally VIII: hearing normal bilaterally IX,X: gag reflex present XI: bilateral shoulder shrug XII: midline tongue extension Motor: Right : Upper extremity   5/5    Left:     Upper extremity   5/5 With lower extremity testing  patient lifts the right leg off the bed with 5/5 hip flexor strength.  When the left lifted the patient gives 4-/5 strength but gives no reciprocal downward movement of the RLE.  When both legs lifted together gives full strength bilaterally proximally.  Bilateral knee flexion and extension at 5/5.  When tested in isolation the patient gives 5/5 plantar flexion on the right and 3+/5 on the left.  When both are tested together she can only give 2/5 bilaterally.  2/5 plantar extension bilaterally.  Right inversion and eversion 5/5, 2-3/5 left inversion and eversion.   Tone and bulk:normal tone throughout; no atrophy noted Sensory: Pinprick and light touch decreased in a stocking distribution to mid calf on the right and to just below the knee on the left.  Sensation less on the left as compared to the right Deep Tendon Reflexes: 2+ in the upper extremities and at the knees, 2+ right knee jerk, absent left knee jerk.   Plantars: Right: downgoing   Left: downgoing Cerebellar: normal finger-to-nose and normal heel-to-shin test Gait: normal gait and station CV: pulses palpable throughout    Laboratory Studies:   Basic Metabolic Panel:  Recent Labs Lab 02/23/13 1145 02/23/13 1739  02/24/13 0615 02/24/13 0905 02/24/13 1245 02/25/13 0340 02/25/13 1544  NA 127* 135  < > 136 134* 132* 136 137  K 3.4* 2.5*  < > 2.9* 2.8* 2.5* 2.9* 3.5  CL 93* 105  < > 107 105 102 106 105  CO2 11* 15*  < > 21 21 19 24 24   GLUCOSE 767* 282*  < > 117* 138* 209* 225* 277*  BUN 10 6  < > 4* 4* 4* 6 6  CREATININE 0.40* 0.30*  < > 0.32* 0.32* 0.34* 0.29* 0.44*  CALCIUM 8.9 7.7*  < > 8.3* 8.1* 8.1* 8.2* 8.4  MG  --  1.5  --   --   --  1.7  --   --   PHOS  --  1.5*  --   --   --   --   --   --   < > = values in this interval not displayed.  Liver Function Tests:  Recent Labs Lab 02/23/13 1145  AST 7  ALT 7  ALKPHOS 135*  BILITOT 0.4  PROT 7.3  ALBUMIN 3.6   No results found for this basename: LIPASE,  AMYLASE,  in the last 168 hours No results found for this basename: AMMONIA,  in the last 168 hours  CBC:  Recent Labs Lab 02/23/13 1145  WBC 8.6  NEUTROABS 6.3  HGB 15.0  HCT 42.5  MCV 88.4  PLT 145*    Cardiac Enzymes: No results found for this basename: CKTOTAL, CKMB, CKMBINDEX, TROPONINI,  in the last 168 hours  BNP: No components found with this basename: POCBNP,   CBG:  Recent Labs Lab 02/24/13 2110 02/25/13 0742 02/25/13 1136 02/25/13 1636 02/25/13 2145  GLUCAP 427* 206* 224* 220* 225*    Microbiology: Results for orders placed during the hospital encounter of 02/23/13  WOUND CULTURE     Status: None   Collection Time    02/23/13  2:10 PM      Result Value Range Status   Specimen Description LEG  Final   Special Requests Normal   Final   Gram Stain PENDING   Incomplete   Culture     Final   Value: MODERATE STAPHYLOCOCCUS AUREUS     Note: RIFAMPIN AND GENTAMICIN SHOULD NOT BE USED AS SINGLE DRUGS FOR TREATMENT OF STAPH INFECTIONS.   Report Status PENDING   Incomplete  MRSA PCR SCREENING     Status: None   Collection Time    02/23/13  5:16 PM      Result Value Range Status   MRSA by PCR NEGATIVE  NEGATIVE Final   Comment:            The GeneXpert MRSA Assay (FDA     approved for NASAL specimens     only), is one component of a     comprehensive MRSA colonization     surveillance program. It is not     intended to diagnose MRSA     infection nor to guide or     monitor treatment for     MRSA infections.  CULTURE, BLOOD (ROUTINE X 2)     Status: None   Collection Time    02/23/13  5:30 PM      Result Value Range Status   Specimen Description BLOOD LEFT ARM   Final   Special Requests BOTTLES DRAWN AEROBIC AND ANAEROBIC 10CC   Final   Culture  Setup Time 02/23/2013 23:29   Final   Culture     Final   Value:        BLOOD CULTURE RECEIVED NO GROWTH TO DATE CULTURE WILL BE HELD FOR 5 DAYS BEFORE ISSUING A FINAL NEGATIVE REPORT   Report Status  PENDING   Incomplete  CULTURE, BLOOD (ROUTINE X 2)     Status: None   Collection Time    02/23/13  5:40 PM      Result Value Range Status   Specimen Description BLOOD LEFT ARM   Final   Special Requests BOTTLES DRAWN AEROBIC AND ANAEROBIC 10CC   Final   Culture  Setup Time 02/23/2013 23:29   Final   Culture     Final   Value:        BLOOD CULTURE RECEIVED NO GROWTH TO DATE CULTURE WILL BE HELD FOR 5 DAYS BEFORE ISSUING A FINAL NEGATIVE REPORT   Report Status PENDING   Incomplete    Coagulation Studies: No results found for this basename: LABPROT, INR,  in the last 72 hours  Urinalysis:  Recent Labs Lab 02/23/13 1145  COLORURINE YELLOW  LABSPEC 1.036*  PHURINE 5.5  GLUCOSEU >1000*  HGBUR TRACE*  BILIRUBINUR NEGATIVE  KETONESUR >80*  PROTEINUR NEGATIVE  UROBILINOGEN 0.2  NITRITE NEGATIVE  LEUKOCYTESUR NEGATIVE    Lipid Panel:  No results found for this basename: chol, trig, hdl, cholhdl, vldl, ldlcalc    HgbA1C:  Lab Results  Component Value Date   HGBA1C 17.1* 10/21/2012    Urine Drug Screen:   No results found for this basename: labopia, cocainscrnur, labbenz, amphetmu, thcu, labbarb    Alcohol Level: No results found for this basename: ETH,  in the last 168 hours  Imaging: No results found.   Assessment/Plan: 22 year old female with complaint of 2 weeks of left leg weakness.  Patient is a difficult historian and gives multiple functional features on examination as well.  She does seem to have features consistent with a diabetic peripheral neuropathy, but with asymmetric DTR's and questionable proximal weakness, can not rule out other  etiologies.    Recommendations: 1.  MRI of the lumbar spine 2.  PT consult 3.  Blood sugar control stressed to patient  Thana Farr, MD [facility] [phone] 02/25/2013, 10:16 PM

## 2013-02-25 NOTE — Progress Notes (Signed)
eLink Physician-Brief Progress Note Patient Name: CALLEE ROHRIG DOB: [birth year] MRN: [id number]  Date of Service  02/25/2013   HPI/Events of Note   Hypokalemia Gap closed Glucose >250  eICU Interventions  Change IVF to 1/2 NS with 20 mEq KCl Oral KCl   Intervention Category Minor Interventions: Electrolytes abnormality - evaluation and management  Karmah Potocki 02/25/2013, 5:45 AM

## 2013-02-25 NOTE — Consult Note (Signed)
Julie [name] Jun 20, 1991  [id number].    Requesting MD: Dr. Butler Denmark Chief Complaint/Reason for Consult: skin infection possible abscess HPI:  22 y.o. female with hx of DM1, off insulin for at least 1 month presented to the ED with c/o skin rash over her back, buttocks, and thighs - which is painful and associated with fever/chills. Also reports nausea and inability to eat for a few days. She was found to be in DKA and referred for admission.  She is currently being treated for her DKA.  We were asked to see the patient regarding her skin rash.  She states it started on Wednesday and it has spread.  She has "popped" some of them and purulent drainage came out.  She has never had a rash like this before.     ROS: All systems reviewed and otherwise negative except for as above  Family History  Problem Relation Age of Onset  . Diabetes Mother     Past Medical History  Diagnosis Date  . Diabetes mellitus without complication     Past Surgical History  Procedure Laterality Date  . Cesarean section    . Cesarean section  2012    Social History:  reports that she has never smoked. She does not have any smokeless tobacco history on file. She reports that she does not drink alcohol or use illicit drugs.  Allergies: No Known Allergies  Medications Prior to Admission  Medication Sig Dispense Refill  . insulin NPH-insulin regular (NOVOLIN 70/30) (70-30) 100 UNIT/ML injection Inject 45 Units into the skin 2 (two) times daily with a meal.  10 mL  1  . metFORMIN (GLUCOPHAGE) 500 MG tablet Take 500 mg by mouth 2 (two) times daily with a meal.        Blood pressure 95/62, pulse 68, temperature 98.3 F (36.8 C), temperature source Oral, resp. rate 15, height 5\' 10"  (1.778 m), weight 134 lb 11.2 oz (61.1 kg), last menstrual period 02/22/2013, SpO2 100.00%. Physical Exam: General: pleasant, WD/WN Hispanic female who is laying in bed in NAD HEENT: head is normocephalic, atraumatic.   Sclera are noninjected.  PERRL.  Ears and nose without any masses or lesions.  Mouth is pink and moist MS: all 4 extremities are symmetrical with no cyanosis, clubbing, or edema. Skin: warm and dry with no masses; buttock, posterior thighs, and lower back has a papulopustular rash with erythema and some induration, no fluctuance or active drainage, no ulcerations or necrosis Psych: A&Ox3 with an appropriate affect.  Results for orders placed during the hospital encounter of 02/23/13 (from the past 48 hour(s))  GLUCOSE, CAPILLARY     Status: Abnormal   Collection Time    02/23/13  2:05 PM      Result Value Range   Glucose-Capillary 508 (*) 70 - 99 mg/dL   Comment 1 Notify RN     Comment 2 Documented in Chart     Comment 3 Call MD NNP PA CNM    WOUND CULTURE     Status: None   Collection Time    02/23/13  2:10 PM      Result Value Range   Specimen Description LEG     Special Requests Normal     Gram Stain PENDING     Culture       Value: MODERATE STAPHYLOCOCCUS AUREUS     Note: RIFAMPIN AND GENTAMICIN SHOULD NOT BE USED AS SINGLE DRUGS FOR TREATMENT OF STAPH INFECTIONS.   Report Status PENDING  GLUCOSE, CAPILLARY     Status: Abnormal   Collection Time    02/23/13  3:06 PM      Result Value Range   Glucose-Capillary 422 (*) 70 - 99 mg/dL   Comment 1 Notify RN     Comment 2 Documented in Chart     Comment 3 Call MD NNP PA CNM    GLUCOSE, CAPILLARY     Status: Abnormal   Collection Time    02/23/13  4:45 PM      Result Value Range   Glucose-Capillary 284 (*) 70 - 99 mg/dL  MRSA PCR SCREENING     Status: None   Collection Time    02/23/13  5:16 PM      Result Value Range   MRSA by PCR NEGATIVE  NEGATIVE   Comment:            The GeneXpert MRSA Assay (FDA     approved for NASAL specimens     only), is one component of a     comprehensive MRSA colonization     surveillance program. It is not     intended to diagnose MRSA     infection nor to guide or     monitor treatment  for     MRSA infections.  CULTURE, BLOOD (ROUTINE X 2)     Status: None   Collection Time    02/23/13  5:30 PM      Result Value Range   Specimen Description BLOOD LEFT ARM     Special Requests BOTTLES DRAWN AEROBIC AND ANAEROBIC 10CC     Culture  Setup Time 02/23/2013 23:29     Culture       Value:        BLOOD CULTURE RECEIVED NO GROWTH TO DATE CULTURE WILL BE HELD FOR 5 DAYS BEFORE ISSUING A FINAL NEGATIVE REPORT   Report Status PENDING    BASIC METABOLIC PANEL     Status: Abnormal   Collection Time    02/23/13  5:39 PM      Result Value Range   Sodium 135  135 - 145 mEq/L   Potassium 2.5 (*) 3.5 - 5.1 mEq/L   Comment: CRITICAL RESULT CALLED TO, READ BACK BY AND VERIFIED WITH:     Bess Harvest 1843 02/23/13 WBOND   Chloride 105  96 - 112 mEq/L   CO2 15 (*) 19 - 32 mEq/L   Glucose, Bld 282 (*) 70 - 99 mg/dL   BUN 6  6 - 23 mg/dL   Creatinine, Ser 8.65 (*) 0.50 - 1.10 mg/dL   Calcium 7.7 (*) 8.4 - 10.5 mg/dL   GFR calc non Af Amer >90  >90 mL/min   GFR calc Af Amer >90  >90 mL/min   Comment:            The eGFR has been calculated     using the CKD EPI equation.     This calculation has not been     validated in all clinical     situations.     eGFR's persistently     <90 mL/min signify     possible Chronic Kidney Disease.  PHOSPHORUS     Status: Abnormal   Collection Time    02/23/13  5:39 PM      Result Value Range   Phosphorus 1.5 (*) 2.3 - 4.6 mg/dL  MAGNESIUM     Status: None   Collection Time    02/23/13  5:39 PM  Result Value Range   Magnesium 1.5  1.5 - 2.5 mg/dL  CULTURE, BLOOD (ROUTINE X 2)     Status: None   Collection Time    02/23/13  5:40 PM      Result Value Range   Specimen Description BLOOD LEFT ARM     Special Requests BOTTLES DRAWN AEROBIC AND ANAEROBIC 10CC     Culture  Setup Time 02/23/2013 23:29     Culture       Value:        BLOOD CULTURE RECEIVED NO GROWTH TO DATE CULTURE WILL BE HELD FOR 5 DAYS BEFORE ISSUING A FINAL NEGATIVE REPORT    Report Status PENDING    GLUCOSE, CAPILLARY     Status: Abnormal   Collection Time    02/23/13  5:52 PM      Result Value Range   Glucose-Capillary 229 (*) 70 - 99 mg/dL  GLUCOSE, CAPILLARY     Status: Abnormal   Collection Time    02/23/13  6:58 PM      Result Value Range   Glucose-Capillary 216 (*) 70 - 99 mg/dL  GLUCOSE, CAPILLARY     Status: Abnormal   Collection Time    02/23/13  8:04 PM      Result Value Range   Glucose-Capillary 149 (*) 70 - 99 mg/dL  GLUCOSE, CAPILLARY     Status: Abnormal   Collection Time    02/23/13  9:08 PM      Result Value Range   Glucose-Capillary 133 (*) 70 - 99 mg/dL  GLUCOSE, CAPILLARY     Status: Abnormal   Collection Time    02/23/13 10:18 PM      Result Value Range   Glucose-Capillary 131 (*) 70 - 99 mg/dL  BASIC METABOLIC PANEL     Status: Abnormal   Collection Time    02/23/13 11:05 PM      Result Value Range   Sodium 130 (*) 135 - 145 mEq/L   Potassium 2.6 (*) 3.5 - 5.1 mEq/L   Comment: CRITICAL RESULT CALLED TO, READ BACK BY AND VERIFIED WITH:     E.OSORI RN 0118 02/24/13 E.GADDY   Chloride 101  96 - 112 mEq/L   CO2 17 (*) 19 - 32 mEq/L   Glucose, Bld 231 (*) 70 - 99 mg/dL   BUN 5 (*) 6 - 23 mg/dL   Creatinine, Ser 4.09 (*) 0.50 - 1.10 mg/dL   Calcium 7.8 (*) 8.4 - 10.5 mg/dL   GFR calc non Af Amer >90  >90 mL/min   GFR calc Af Amer >90  >90 mL/min   Comment:            The eGFR has been calculated     using the CKD EPI equation.     This calculation has not been     validated in all clinical     situations.     eGFR's persistently     <90 mL/min signify     possible Chronic Kidney Disease.  GLUCOSE, CAPILLARY     Status: Abnormal   Collection Time    02/23/13 11:21 PM      Result Value Range   Glucose-Capillary 192 (*) 70 - 99 mg/dL  GLUCOSE, CAPILLARY     Status: Abnormal   Collection Time    02/24/13 12:22 AM      Result Value Range   Glucose-Capillary 191 (*) 70 - 99 mg/dL  GLUCOSE, CAPILLARY     Status: Abnormal  Collection Time    02/24/13  1:25 AM      Result Value Range   Glucose-Capillary 203 (*) 70 - 99 mg/dL  GLUCOSE, CAPILLARY     Status: Abnormal   Collection Time    02/24/13  2:25 AM      Result Value Range   Glucose-Capillary 187 (*) 70 - 99 mg/dL  GLUCOSE, CAPILLARY     Status: Abnormal   Collection Time    02/24/13  3:29 AM      Result Value Range   Glucose-Capillary 179 (*) 70 - 99 mg/dL  GLUCOSE, CAPILLARY     Status: Abnormal   Collection Time    02/24/13  4:30 AM      Result Value Range   Glucose-Capillary 160 (*) 70 - 99 mg/dL  GLUCOSE, CAPILLARY     Status: Abnormal   Collection Time    02/24/13  5:32 AM      Result Value Range   Glucose-Capillary 136 (*) 70 - 99 mg/dL  BASIC METABOLIC PANEL     Status: Abnormal   Collection Time    02/24/13  6:15 AM      Result Value Range   Sodium 136  135 - 145 mEq/L   Potassium 2.9 (*) 3.5 - 5.1 mEq/L   Chloride 107  96 - 112 mEq/L   CO2 21  19 - 32 mEq/L   Glucose, Bld 117 (*) 70 - 99 mg/dL   BUN 4 (*) 6 - 23 mg/dL   Creatinine, Ser 6.04 (*) 0.50 - 1.10 mg/dL   Calcium 8.3 (*) 8.4 - 10.5 mg/dL   GFR calc non Af Amer >90  >90 mL/min   GFR calc Af Amer >90  >90 mL/min   Comment:            The eGFR has been calculated     using the CKD EPI equation.     This calculation has not been     validated in all clinical     situations.     eGFR's persistently     <90 mL/min signify     possible Chronic Kidney Disease.  GLUCOSE, CAPILLARY     Status: Abnormal   Collection Time    02/24/13  6:48 AM      Result Value Range   Glucose-Capillary 111 (*) 70 - 99 mg/dL  GLUCOSE, CAPILLARY     Status: Abnormal   Collection Time    02/24/13  7:50 AM      Result Value Range   Glucose-Capillary 116 (*) 70 - 99 mg/dL  GLUCOSE, CAPILLARY     Status: Abnormal   Collection Time    02/24/13  7:55 AM      Result Value Range   Glucose-Capillary 125 (*) 70 - 99 mg/dL  GLUCOSE, CAPILLARY     Status: Abnormal   Collection Time     02/24/13  8:46 AM      Result Value Range   Glucose-Capillary 122 (*) 70 - 99 mg/dL  BASIC METABOLIC PANEL     Status: Abnormal   Collection Time    02/24/13  9:05 AM      Result Value Range   Sodium 134 (*) 135 - 145 mEq/L   Potassium 2.8 (*) 3.5 - 5.1 mEq/L   Chloride 105  96 - 112 mEq/L   CO2 21  19 - 32 mEq/L   Glucose, Bld 138 (*) 70 - 99 mg/dL   BUN 4 (*) 6 -  23 mg/dL   Creatinine, Ser 1.61 (*) 0.50 - 1.10 mg/dL   Calcium 8.1 (*) 8.4 - 10.5 mg/dL   GFR calc non Af Amer >90  >90 mL/min   GFR calc Af Amer >90  >90 mL/min   Comment:            The eGFR has been calculated     using the CKD EPI equation.     This calculation has not been     validated in all clinical     situations.     eGFR's persistently     <90 mL/min signify     possible Chronic Kidney Disease.  GLUCOSE, CAPILLARY     Status: Abnormal   Collection Time    02/24/13  9:49 AM      Result Value Range   Glucose-Capillary 145 (*) 70 - 99 mg/dL  GLUCOSE, CAPILLARY     Status: Abnormal   Collection Time    02/24/13 10:52 AM      Result Value Range   Glucose-Capillary 173 (*) 70 - 99 mg/dL  GLUCOSE, CAPILLARY     Status: Abnormal   Collection Time    02/24/13 12:20 PM      Result Value Range   Glucose-Capillary 216 (*) 70 - 99 mg/dL  BASIC METABOLIC PANEL     Status: Abnormal   Collection Time    02/24/13 12:45 PM      Result Value Range   Sodium 132 (*) 135 - 145 mEq/L   Potassium 2.5 (*) 3.5 - 5.1 mEq/L   Comment: CRITICAL RESULT CALLED TO, READ BACK BY AND VERIFIED WITH:     HOUDESHELL M,RN 1330 02/24/13 SCALES H   Chloride 102  96 - 112 mEq/L   CO2 19  19 - 32 mEq/L   Glucose, Bld 209 (*) 70 - 99 mg/dL   BUN 4 (*) 6 - 23 mg/dL   Creatinine, Ser 0.96 (*) 0.50 - 1.10 mg/dL   Calcium 8.1 (*) 8.4 - 10.5 mg/dL   GFR calc non Af Amer >90  >90 mL/min   GFR calc Af Amer >90  >90 mL/min   Comment:            The eGFR has been calculated     using the CKD EPI equation.     This calculation has not  been     validated in all clinical     situations.     eGFR's persistently     <90 mL/min signify     possible Chronic Kidney Disease.  MAGNESIUM     Status: None   Collection Time    02/24/13 12:45 PM      Result Value Range   Magnesium 1.7  1.5 - 2.5 mg/dL  GLUCOSE, CAPILLARY     Status: Abnormal   Collection Time    02/24/13  5:16 PM      Result Value Range   Glucose-Capillary 280 (*) 70 - 99 mg/dL  GLUCOSE, CAPILLARY     Status: Abnormal   Collection Time    02/24/13  9:10 PM      Result Value Range   Glucose-Capillary 427 (*) 70 - 99 mg/dL  BASIC METABOLIC PANEL     Status: Abnormal   Collection Time    02/25/13  3:40 AM      Result Value Range   Sodium 136  135 - 145 mEq/L   Potassium 2.9 (*) 3.5 - 5.1 mEq/L   Chloride 106  96 - 112 mEq/L   CO2 24  19 - 32 mEq/L   Glucose, Bld 225 (*) 70 - 99 mg/dL   BUN 6  6 - 23 mg/dL   Creatinine, Ser 1.61 (*) 0.50 - 1.10 mg/dL   Calcium 8.2 (*) 8.4 - 10.5 mg/dL   GFR calc non Af Amer >90  >90 mL/min   GFR calc Af Amer >90  >90 mL/min   Comment:            The eGFR has been calculated     using the CKD EPI equation.     This calculation has not been     validated in all clinical     situations.     eGFR's persistently     <90 mL/min signify     possible Chronic Kidney Disease.  GLUCOSE, CAPILLARY     Status: Abnormal   Collection Time    02/25/13  7:42 AM      Result Value Range   Glucose-Capillary 206 (*) 70 - 99 mg/dL  GLUCOSE, CAPILLARY     Status: Abnormal   Collection Time    02/25/13 11:36 AM      Result Value Range   Glucose-Capillary 224 (*) 70 - 99 mg/dL   No results found.     Assessment/Plan Papulopustular rash on buttock, post. thighs, and lower back @ high risk for MRSA given DM 1.  Continue antibiotics (vanc), can likely go home on Doxy or Bactrim or depending on sensitivity 2.  Warm compresses, polysporin ointment, and Hibiclens shower washes to affected areas 3.  No surgical interventions  needed 4.  Avoid popping lesions which can allow them to spread    Julie [name], Julie [name] 02/25/2013, 1:18 PM Pager: [phone]

## 2013-02-25 NOTE — Progress Notes (Addendum)
MD on call/rapid response notified that patient was having weakness and loss of sensation particularly in distal LLE.  Patient states that this has been going on for 2 weeks.  At 2030 MD came in to examine patient, and ordered neuro consult.  Will continue to monitor patient.

## 2013-02-25 NOTE — Progress Notes (Signed)
Warm compresses to rash on back and buttocks per orders. Pt. States helping with pain.

## 2013-02-25 NOTE — Progress Notes (Signed)
[facility] Progress Note [place] TEAM 1 - Stepdown/ICU TEAM   Julie [name] EAV:[id number] DOB: Mar 08, 1991 DOA: 02/23/2013 PCP: No primary provider on file.  Brief narrative: Julie [name] is a 22 y.o. female with hx of DM1, off insulin for at least 1 month presented to the ED  with c/o skin rash over her back, buttocks - which is painful and associated with fever. Also reports nausea and inability to eat for a few days. She was found to be in dka and referred for admission.    Assessment/Plan: Principal Problem:   DKA, type 1 - unable to afford medications - will need [employer] brand on d/c -resumed 70/30 - will increase dose today as sugars still quite elevated - emphasized strictly the need for her to be compliant and the complications that can occur   Active Problems:   Hypokalemia - cont to replace and follow    Pustular rash - cont Vanc for today- likely staph infection- surgery does not feel that drainage is necessary- pain quite uncontrolled- add PRN morphin    Code Status: full Family Communication: none Disposition Plan: follow for d/c tomorrow  Consultants: none  Procedures: none  Antibiotics: Vancomycin  DVT prophylaxis: scds   HPI/Subjective: Pt alert- pain in abscesses is severe and she was unable to sleep last night    Objective: Blood pressure 84/49, pulse 72, temperature 98.5 F (36.9 C), temperature source Oral, resp. rate 16, height 5\' 10"  (1.778 m), weight 61.1 kg (134 lb 11.2 oz), last menstrual period 02/22/2013, SpO2 97.00%.  Intake/Output Summary (Last 24 hours) at 02/25/13 1722 Last data filed at 02/25/13 1000  Gross per 24 hour  Intake 2655.83 ml  Output      0 ml  Net 2655.83 ml     Exam: General: No acute respiratory distress Lungs: Clear to auscultation bilaterally without wheezes or crackles Cardiovascular: Regular rate and rhythm without murmur gallop or rub normal S1 and S2 Abdomen:  Nontender, nondistended, soft, bowel sounds positive, no rebound, no ascites, no appreciable mass Extremities: No significant cyanosis, clubbing, or edema bilateral lower extremities Skin- indurated pustules noted on lower back/ buttocks and thighs- very indurated- not currently draining  Data Reviewed: Basic Metabolic Panel:  Recent Labs Lab 02/23/13 1145 02/23/13 1739  02/24/13 0615 02/24/13 0905 02/24/13 1245 02/25/13 0340 02/25/13 1544  NA 127* 135  < > 136 134* 132* 136 137  K 3.4* 2.5*  < > 2.9* 2.8* 2.5* 2.9* 3.5  CL 93* 105  < > 107 105 102 106 105  CO2 11* 15*  < > 21 21 19 24 24   GLUCOSE 767* 282*  < > 117* 138* 209* 225* 277*  BUN 10 6  < > 4* 4* 4* 6 6  CREATININE 0.40* 0.30*  < > 0.32* 0.32* 0.34* 0.29* 0.44*  CALCIUM 8.9 7.7*  < > 8.3* 8.1* 8.1* 8.2* 8.4  MG  --  1.5  --   --   --  1.7  --   --   PHOS  --  1.5*  --   --   --   --   --   --   < > = values in this interval not displayed. Liver Function Tests:  Recent Labs Lab 02/23/13 1145  AST 7  ALT 7  ALKPHOS 135*  BILITOT 0.4  PROT 7.3  ALBUMIN 3.6   No results found for this basename: LIPASE, AMYLASE,  in the last 168 hours No results found  for this basename: AMMONIA,  in the last 168 hours CBC:  Recent Labs Lab 02/23/13 1145  WBC 8.6  NEUTROABS 6.3  HGB 15.0  HCT 42.5  MCV 88.4  PLT 145*   Cardiac Enzymes: No results found for this basename: CKTOTAL, CKMB, CKMBINDEX, TROPONINI,  in the last 168 hours BNP (last 3 results) No results found for this basename: PROBNP,  in the last 8760 hours CBG:  Recent Labs Lab 02/24/13 1716 02/24/13 2110 02/25/13 0742 02/25/13 1136 02/25/13 1636  GLUCAP 280* 427* 206* 224* 220*    Recent Results (from the past 240 hour(s))  WOUND CULTURE     Status: None   Collection Time    02/23/13  2:10 PM      Result Value Range Status   Specimen Description LEG   Final   Special Requests Normal   Final   Gram Stain PENDING   Incomplete   Culture      Final   Value: MODERATE STAPHYLOCOCCUS AUREUS     Note: RIFAMPIN AND GENTAMICIN SHOULD NOT BE USED AS SINGLE DRUGS FOR TREATMENT OF STAPH INFECTIONS.   Report Status PENDING   Incomplete  MRSA PCR SCREENING     Status: None   Collection Time    02/23/13  5:16 PM      Result Value Range Status   MRSA by PCR NEGATIVE  NEGATIVE Final   Comment:            The GeneXpert MRSA Assay (FDA     approved for NASAL specimens     only), is one component of a     comprehensive MRSA colonization     surveillance program. It is not     intended to diagnose MRSA     infection nor to guide or     monitor treatment for     MRSA infections.  CULTURE, BLOOD (ROUTINE X 2)     Status: None   Collection Time    02/23/13  5:30 PM      Result Value Range Status   Specimen Description BLOOD LEFT ARM   Final   Special Requests BOTTLES DRAWN AEROBIC AND ANAEROBIC 10CC   Final   Culture  Setup Time 02/23/2013 23:29   Final   Culture     Final   Value:        BLOOD CULTURE RECEIVED NO GROWTH TO DATE CULTURE WILL BE HELD FOR 5 DAYS BEFORE ISSUING A FINAL NEGATIVE REPORT   Report Status PENDING   Incomplete  CULTURE, BLOOD (ROUTINE X 2)     Status: None   Collection Time    02/23/13  5:40 PM      Result Value Range Status   Specimen Description BLOOD LEFT ARM   Final   Special Requests BOTTLES DRAWN AEROBIC AND ANAEROBIC 10CC   Final   Culture  Setup Time 02/23/2013 23:29   Final   Culture     Final   Value:        BLOOD CULTURE RECEIVED NO GROWTH TO DATE CULTURE WILL BE HELD FOR 5 DAYS BEFORE ISSUING A FINAL NEGATIVE REPORT   Report Status PENDING   Incomplete     Studies:  Recent x-ray studies have been reviewed in detail by the Attending Physician  Scheduled Meds:  Scheduled Meds: . chlorhexidine   Topical Daily  . insulin aspart  0-15 Units Subcutaneous Q lunch  . insulin aspart protamine- aspart  44 Units Subcutaneous BID WC  .  polymixin-bacitracin   Topical BID  . vancomycin  1,250 mg  Intravenous Q12H   Continuous Infusions:    Time spent on care of this patient: 35 min   Calvert Cantor, MD [phone]  [facility] Office  [phone] Pager - Text Page per Amion as per below:  On-Call/Text Page:      [email or web address]      password [email or web address]  If 7PM-7AM, please contact night-coverage [email or web address] Password [email or web address] 02/25/2013, 5:22 PM   LOS: 2 days

## 2013-02-25 NOTE — Consult Note (Signed)
Seen, agree with above.   Multiple furuncles.   No fluctuance found.

## 2013-02-25 NOTE — Progress Notes (Signed)
[facility] progress note. Chief complaint. Left leg weakness. History of present illness. This 22 year old female in hospital with DKA. She also has a pustular rash on her back felt likely a staph infection. She notified nursing tonight that she has been having left leg weakness. I came to see the patient at bedside. She states that the weakness has been present for approximately 2 weeks. She denies any trauma recently. She states the weakness is not painful but she does describe a loss of sensation in the distal left leg and foot. Vital signs temperature 98.5, pulse 72, respiration 18, blood pressure 84/49. O2 sats 97%. General appearance. Well-developed young female who is alert, cooperative, in no distress. Neurologic. Cranial nerves 2-12 grossly intact. Upper extremity reflexes are brisk, grip strength strong and equal bilaterally, arm strength 5/5 and equal bilaterally. Lower remedy left leg about 2+ extension and 1+ flexion compared to 5/5 on the right. Babinski is nonreactive. Soft touch present in the left foot and lower leg in a stocking pattern. Impression/plan. Problem #1. Left leg weakness and decreased sensation. Etiology here is unclear. I did discuss the case with neuro hospitalist [facility] who has kindly consented to see the patient in consult this evening. We'll follow for any further recommendations per neurology.

## 2013-02-26 ENCOUNTER — Inpatient Hospital Stay (HOSPITAL_COMMUNITY): Payer: Medicaid Other

## 2013-02-26 LAB — BASIC METABOLIC PANEL
CO2: 30 mEq/L (ref 19–32)
Calcium: 8.5 mg/dL (ref 8.4–10.5)
Chloride: 104 mEq/L (ref 96–112)
Glucose, Bld: 199 mg/dL — ABNORMAL HIGH (ref 70–99)
Potassium: 3.5 mEq/L (ref 3.5–5.1)
Sodium: 141 mEq/L (ref 135–145)

## 2013-02-26 LAB — GLUCOSE, CAPILLARY: Glucose-Capillary: 163 mg/dL — ABNORMAL HIGH (ref 70–99)

## 2013-02-26 LAB — WOUND CULTURE: Gram Stain: NONE SEEN

## 2013-02-26 MED ORDER — LORAZEPAM 2 MG/ML IJ SOLN
INTRAMUSCULAR | Status: AC
  Administered 2013-02-26: 1 mg via INTRAVENOUS
  Filled 2013-02-26: qty 1

## 2013-02-26 MED ORDER — GADOBENATE DIMEGLUMINE 529 MG/ML IV SOLN
15.0000 mL | Freq: Once | INTRAVENOUS | Status: AC
  Administered 2013-02-26: 15 mL via INTRAVENOUS

## 2013-02-26 MED ORDER — LORAZEPAM 2 MG/ML IJ SOLN: 1.0000 mg | Freq: Once | INTRAMUSCULAR | Status: AC

## 2013-02-26 MED ORDER — ENOXAPARIN SODIUM 40 MG/0.4ML ~~LOC~~ SOLN
40.0000 mg | Freq: Every day | SUBCUTANEOUS | Status: DC
  Administered 2013-02-26 – 2013-02-28 (×2): 40 mg via SUBCUTANEOUS
  Filled 2013-02-26 (×3): qty 0.4

## 2013-02-26 MED ORDER — "BD GETTING STARTED TAKE HOME KIT: 1ML X 30 G SYRINGES, "
1.0000 | Freq: Once | Status: AC
  Administered 2013-02-26: 1
  Filled 2013-02-26: qty 1

## 2013-02-26 NOTE — Progress Notes (Signed)
PATIENT DETAILS Name: Julie [name] Age: 22 y.o. Sex: female Date of Birth: Mar 01, 1991 Admit Date: 02/23/2013 Admitting Physician Sorin Luanne Bras, MD PCP:No primary provider on file.  Subjective: No major complaints  Assessment/Plan: Principal Problem:   DKA, type 1 -resolved-now on SQ insuline  Active Problems: Diabetes Mellitus -CBG's moderately well controlled -Continue with Insulin 70/30 -begin insulin teaching  LLE weakness -per Neurology -PT eval  Follicultis -c/w Vanco -appreciate CCS eval  Disposition: Remain inpatient  DVT Prophylaxis: Prophylactic Lovenox   Code Status: Full code   Family Communication None at bedside  Procedures:  None  CONSULTS:  neurology and general surgery   MEDICATIONS: Scheduled Meds: . chlorhexidine   Topical Daily  . insulin aspart  0-15 Units Subcutaneous Q lunch  . insulin aspart protamine- aspart  44 Units Subcutaneous BID WC  . polymixin-bacitracin   Topical BID  . vancomycin  1,250 mg Intravenous Q12H   Continuous Infusions:  PRN Meds:.acetaminophen, dextrose, HYDROcodone-acetaminophen, metoCLOPramide (REGLAN) injection, morphine injection, ondansetron (ZOFRAN) IV, zolpidem  Antibiotics: Anti-infectives   Start     Dose/Rate Route Frequency Ordered Stop   02/24/13 0100  vancomycin (VANCOCIN) 1,250 mg in sodium chloride 0.9 % 250 mL IVPB     1,250 mg 166.7 mL/hr over 90 Minutes Intravenous Every 12 hours 02/23/13 1721     02/23/13 1230  vancomycin (VANCOCIN) IVPB 1000 mg/200 mL premix     1,000 mg 200 mL/hr over 60 Minutes Intravenous  Once 02/23/13 1227 02/23/13 1410       PHYSICAL EXAM: Vital signs in last 24 hours: Filed Vitals:   02/25/13 1129 02/25/13 1429 02/25/13 2148 02/26/13 0500  BP: 95/62 84/49 92/53  84/50  Pulse: 68 72 85 70  Temp:  98.5 F (36.9 C) 98.8 F (37.1 C) 98.8 F (37.1 C)  TempSrc:  Oral Oral Oral  Resp: 15 16 16 18   Height:      Weight:      SpO2: 100% 97%  100% 96%    Weight change:  Filed Weights   02/23/13 1108 02/23/13 1656  Weight: 55.339 kg (122 lb) 61.1 kg (134 lb 11.2 oz)   Body mass index is 19.33 kg/(m^2).   Gen Exam: Awake and alert with clear speech.   Neck: Supple, No JVD.   Chest: B/L Clear.   CVS: S1 S2 Regular, no murmurs.  Abdomen: soft, BS +, non tender, non distended.  Extremities: no edema, lower extremities warm to touch. Neurologic: LLE 4/5-rest 5/5  Skin: No Rash.   Wounds: N/A.    Intake/Output from previous day:  Intake/Output Summary (Last 24 hours) at 02/26/13 1056 Last data filed at 02/26/13 1000  Gross per 24 hour  Intake   1210 ml  Output      0 ml  Net   1210 ml     LAB RESULTS: CBC  Recent Labs Lab 02/23/13 1145  WBC 8.6  HGB 15.0  HCT 42.5  PLT 145*  MCV 88.4  MCH 31.2  MCHC 35.3  RDW 14.6  LYMPHSABS 1.4  MONOABS 0.8  EOSABS 0.1  BASOSABS 0.0    Chemistries   Recent Labs Lab 02/23/13 1145 02/23/13 1739  02/24/13 0905 02/24/13 1245 02/25/13 0340 02/25/13 1544 02/26/13 0605  NA 127* 135  < > 134* 132* 136 137 141  K 3.4* 2.5*  < > 2.8* 2.5* 2.9* 3.5 3.5  CL 93* 105  < > 105 102 106 105 104  CO2 11* 15*  < > 21 19  24 24 30   GLUCOSE 767* 282*  < > 138* 209* 225* 277* 199*  BUN 10 6  < > 4* 4* 6 6 6   CREATININE 0.40* 0.30*  < > 0.32* 0.34* 0.29* 0.44* 0.36*  CALCIUM 8.9 7.7*  < > 8.1* 8.1* 8.2* 8.4 8.5  MG  --  1.5  --   --  1.7  --   --   --   < > = values in this interval not displayed.  CBG:  Recent Labs Lab 02/25/13 0742 02/25/13 1136 02/25/13 1636 02/25/13 2145 02/26/13 0801  GLUCAP 206* 224* 220* 225* 200*    GFR Estimated Creatinine Clearance: 106.4 ml/min (by C-G formula based on Cr of 0.36).  Coagulation profile No results found for this basename: INR, PROTIME,  in the last 168 hours  Cardiac Enzymes No results found for this basename: CK, CKMB, TROPONINI, MYOGLOBIN,  in the last 168 hours  No components found with this basename: POCBNP,   No results found for this basename: DDIMER,  in the last 72 hours No results found for this basename: HGBA1C,  in the last 72 hours No results found for this basename: CHOL, HDL, LDLCALC, TRIG, CHOLHDL, LDLDIRECT,  in the last 72 hours No results found for this basename: TSH, T4TOTAL, FREET3, T3FREE, THYROIDAB,  in the last 72 hours No results found for this basename: VITAMINB12, FOLATE, FERRITIN, TIBC, IRON, RETICCTPCT,  in the last 72 hours No results found for this basename: LIPASE, AMYLASE,  in the last 72 hours  Urine Studies No results found for this basename: UACOL, UAPR, USPG, UPH, UTP, UGL, UKET, UBIL, UHGB, UNIT, UROB, ULEU, UEPI, UWBC, URBC, UBAC, CAST, CRYS, UCOM, BILUA,  in the last 72 hours  MICROBIOLOGY: Recent Results (from the past 240 hour(s))  WOUND CULTURE     Status: None   Collection Time    02/23/13  2:10 PM      Result Value Range Status   Specimen Description LEG   Final   Special Requests Normal   Final   Gram Stain     Final   Value: NO WBC SEEN     NO SQUAMOUS EPITHELIAL CELLS SEEN     FEW GRAM POSITIVE COCCI IN PAIRS   Culture     Final   Value: MODERATE STAPHYLOCOCCUS AUREUS     Note: RIFAMPIN AND GENTAMICIN SHOULD NOT BE USED AS SINGLE DRUGS FOR TREATMENT OF STAPH INFECTIONS.   Report Status 02/26/2013 FINAL   Final   Organism ID, Bacteria STAPHYLOCOCCUS AUREUS   Final  MRSA PCR SCREENING     Status: None   Collection Time    02/23/13  5:16 PM      Result Value Range Status   MRSA by PCR NEGATIVE  NEGATIVE Final   Comment:            The GeneXpert MRSA Assay (FDA     approved for NASAL specimens     only), is one component of a     comprehensive MRSA colonization     surveillance program. It is not     intended to diagnose MRSA     infection nor to guide or     monitor treatment for     MRSA infections.  CULTURE, BLOOD (ROUTINE X 2)     Status: None   Collection Time    02/23/13  5:30 PM      Result Value Range Status   Specimen  Description BLOOD LEFT ARM  Final   Special Requests BOTTLES DRAWN AEROBIC AND ANAEROBIC 10CC   Final   Culture  Setup Time 02/23/2013 23:29   Final   Culture     Final   Value:        BLOOD CULTURE RECEIVED NO GROWTH TO DATE CULTURE WILL BE HELD FOR 5 DAYS BEFORE ISSUING A FINAL NEGATIVE REPORT   Report Status PENDING   Incomplete  CULTURE, BLOOD (ROUTINE X 2)     Status: None   Collection Time    02/23/13  5:40 PM      Result Value Range Status   Specimen Description BLOOD LEFT ARM   Final   Special Requests BOTTLES DRAWN AEROBIC AND ANAEROBIC 10CC   Final   Culture  Setup Time 02/23/2013 23:29   Final   Culture     Final   Value:        BLOOD CULTURE RECEIVED NO GROWTH TO DATE CULTURE WILL BE HELD FOR 5 DAYS BEFORE ISSUING A FINAL NEGATIVE REPORT   Report Status PENDING   Incomplete    RADIOLOGY STUDIES/RESULTS: Mr Lumbar Spine W Wo Contrast  02/26/2013   *RADIOLOGY REPORT*  Clinical Data: Left leg weakness.  Diabetes  MRI LUMBAR SPINE WITHOUT AND WITH CONTRAST  Technique:  Multiplanar and multiecho pulse sequences of the lumbar spine were obtained without and with intravenous contrast.  Contrast: 15mL MULTIHANCE GADOBENATE DIMEGLUMINE 529 MG/ML IV SOLN  Comparison: None.  Findings: Image quality degraded by mild to moderate motion.  Normal lumbar alignment.  Negative for fracture or mass lesion.  No acute bone marrow process.  Normal enhancement without evidence of tumor or infection in the lumbar spine.  Conus medullaris is normal and terminates at L1.  L1-2:  Negative  L2-3:  Negative  L3-4:  Negative  L4-5:  Small central disc protrusion without significant spinal stenosis or neural impingement.  L5-S1:  Moderately large left paracentral disc protrusion.  There is mild flattening of the left S1 nerve root which could account for the patient's left leg weakness.  Right S1 nerve root does not show significant impingement.  IMPRESSION: Small central disc protrusion L4-5.  Moderate left  paracentral disc protrusion L5-S1 with flattening of the left S1 nerve root.   Original Report Authenticated By: Janeece Riggers, M.D.    Jeoffrey Massed, MD  [facility] Pager:[phone]  If 7PM-7AM, please contact night-coverage [email or web address] Password [email or web address] 02/26/2013, 10:56 AM   LOS: 3 days

## 2013-02-26 NOTE — Progress Notes (Signed)
NEURO HOSPITALIST PROGRESS NOTE   SUBJECTIVE:                                                                                                                        Dr. Thad Ranger detailed note from last note reviewed.  Stated that left leg is still weak, " no change". Offers no other complains. MRI L spine showed "small central disc protrusion L4-5 and moderate left paracentral disc protrusion L5-S1 with flattening of the left S1 nerve root. PT help appreciated.   OBJECTIVE:                                                                                                                           Vital signs in last 24 hours: Temp:  [98.7 F (37.1 C)-98.8 F (37.1 C)] 98.7 F (37.1 C) (06/09 1447) Pulse Rate:  [70-99] 99 (06/09 1447) Resp:  [16-18] 16 (06/09 1447) BP: (84-105)/(50-68) 105/68 mmHg (06/09 1447) SpO2:  [96 %-100 %] 99 % (06/09 1447)  Intake/Output from previous day: 06/08 0701 - 06/09 0700 In: 1105 [P.O.:480; I.V.:375; IV Piggyback:250] Out: -  Intake/Output this shift: Total I/O In: 1370 [P.O.:1370] Out: -  Nutritional status: Carb Control  Past Medical History  Diagnosis Date  . Diabetes mellitus without complication     Neurologic Exam:  Mental Status:  Alert, oriented, thought content appropriate. Speech fluent without evidence of aphasia. Able to follow 3 step commands without difficulty.  Cranial Nerves:  II: Discs flat bilaterally; Visual fields grossly normal, pupils equal, round, reactive to light and accommodation  III,IV, VI: ptosis not present, extra-ocular motions intact bilaterally  V,VII: smile symmetric, facial light touch sensation normal bilaterally  VIII: hearing normal bilaterally  IX,X: gag reflex present  XI: bilateral shoulder shrug  XII: midline tongue extension  Motor:  Right : Upper extremity 5/5 Left: Upper extremity 5/5  Motor testing LLE is essentially unchanged, with both proximal and  distal weakness but I am not sure she is offering full effort.  Sensory: Pinprick and light touch decreased in a stocking distribution to mid calf on the right and to just below the knee on the left. Sensation less on the left as compared to the right  Deep Tendon Reflexes: 2+  in the upper extremities and at the knees, 2+ right knee jerk, absent left knee jerk.  Plantars:  Right: downgoing Left: downgoing  Cerebellar:  normal finger-to-nose and normal heel-to-shin test  Gait: normal gait and station   Lab Results: No results found for this basename: cbc, bmp, coags, chol, tri, ldl, hga1c   Lipid Panel No results found for this basename: CHOL, TRIG, HDL, CHOLHDL, VLDL, LDLCALC,  in the last 72 hours  Studies/Results: Mr Lumbar Spine W Wo Contrast  02/26/2013   *RADIOLOGY REPORT*  Clinical Data: Left leg weakness.  Diabetes  MRI LUMBAR SPINE WITHOUT AND WITH CONTRAST  Technique:  Multiplanar and multiecho pulse sequences of the lumbar spine were obtained without and with intravenous contrast.  Contrast: 15mL MULTIHANCE GADOBENATE DIMEGLUMINE 529 MG/ML IV SOLN  Comparison: None.  Findings: Image quality degraded by mild to moderate motion.  Normal lumbar alignment.  Negative for fracture or mass lesion.  No acute bone marrow process.  Normal enhancement without evidence of tumor or infection in the lumbar spine.  Conus medullaris is normal and terminates at L1.  L1-2:  Negative  L2-3:  Negative  L3-4:  Negative  L4-5:  Small central disc protrusion without significant spinal stenosis or neural impingement.  L5-S1:  Moderately large left paracentral disc protrusion.  There is mild flattening of the left S1 nerve root which could account for the patient's left leg weakness.  Right S1 nerve root does not show significant impingement.  IMPRESSION: Small central disc protrusion L4-5.  Moderate left paracentral disc protrusion L5-S1 with flattening of the left S1 nerve root.   Original Report Authenticated  By: Janeece Riggers, M.D.    MEDICATIONS                                                                                                                       I have reviewed the patient's current medications.  ASSESSMENT/PLAN:                                                                                                            Subacute onset LLE weakness with a neuro-exam that is limited due to lack of full effort on motor testing, but overall findings are not quite consistent with a cental or myelopathic process and could be explained by an asymmetric neuropathic process in the setting of poorly controlled DM. MRI L spine unimpressive. Continue PT. Will follow up   Wyatt Portela ,MD [facility] [phone]  02/26/2013, 6:11 PM

## 2013-02-26 NOTE — Plan of Care (Signed)
Problem: Limited Adherence to Nutrition-Related Recommendations (NB-1.6) Goal: Nutrition education Formal process to instruct or train a patient/client in a skill or to impart knowledge to help patients/clients voluntarily manage or modify food choices and eating behavior to maintain or improve health. Outcome: Completed/Met Date Met:  02/26/13 Nutrition Education Note:   RD consulted for nutrition education regarding diabetes.     Lab Results  Component Value Date    HGBA1C 17.1* 10/21/2012    RD provided "Carbohydrate Counting for People with Diabetes" handout from the Academy of [employer]etics. Discussed different food groups and their effects on blood sugar, emphasizing carbohydrate-containing foods. Provided list of carbohydrates and recommended serving sizes of common foods.  Discussed importance of controlled and consistent carbohydrate intake throughout the day. Provided examples of ways to balance meals/snacks and encouraged intake of high-fiber, whole grain complex carbohydrates.   Pt states that she lives with a roommate and her daughter, but are unable to cook any meals at home as they do not have a stove. She eats all her meals at the [place] that she works at. Pt would likely benefit from social work consult as this does not appear to be a good living situation for the pt.  Pt c/o being hungry all the time. RD explained that until her blood sugar is under control she would likely not be able to gain any weight and continue to feel hungry. Encouraged increased intake of protein foods such as meet and cheese, and emphasized the importance of using insulin as instructed. RD will add snacks between meals.   Teach back method utilized.    Expect poor to fair compliance.  Body mass index is 19.33 kg/(m^2). Pt meets criteria for WNL based on current BMI.  Current diet order is Carb Mod Medium, patient is consuming approximately 100% of meals at this time. Labs and  medications reviewed. No further nutrition interventions warranted at this time. RD contact information provided. If additional nutrition issues arise, please re-consult RD.  Clarene Duke RD, LDN Pager [phone] After Hours pager [phone]

## 2013-02-26 NOTE — Progress Notes (Addendum)
ANTIBIOTIC CONSULT NOTE - Follow Up  Pharmacy Consult for Vancomycin Indication:  Cellulitis  No Known Allergies  Patient Measurements: Height: 5\' 10"  (177.8 cm) Weight: 134 lb 11.2 oz (61.1 kg) IBW/kg (Calculated) : 68.5  Vital Signs: Temp: 98.8 F (37.1 C) (06/09 1100) Temp src: Oral (06/09 1100) BP: 98/64 mmHg (06/09 1100) Pulse Rate: 87 (06/09 1100)  Labs:  Recent Labs  02/25/13 0340 02/25/13 1544 02/26/13 0605  CREATININE 0.29* 0.44* 0.36*   Estimated Creatinine Clearance: 106.4 ml/min (by C-G formula based on Cr of 0.36).  Medical History: Past Medical History  Diagnosis Date  . Diabetes mellitus without complication    Medications:  Anti-infectives   Start     Dose/Rate Route Frequency Ordered Stop   02/24/13 0100  vancomycin (VANCOCIN) 1,250 mg in sodium chloride 0.9 % 250 mL IVPB     1,250 mg 166.7 mL/hr over 90 Minutes Intravenous Every 12 hours 02/23/13 1721     02/23/13 1230  vancomycin (VANCOCIN) IVPB 1000 mg/200 mL premix     1,000 mg 200 mL/hr over 60 Minutes Intravenous  Once 02/23/13 1227 02/23/13 1410     Assessment: 22 yo female admitted for skin rash and pain.  She has diabetes and came in with DKA and this is now better controlled.  She has a diffuse rash which she states is painful and she was started on IV Vancomycin for potential MRSA infection.  Her renal function has remained stable with a creatinine of 0.36 today with an estimated clearance of >100 ml/min.  She is currently afebrile and no new CBC to evaluate leukocytosis.  She is currently receiving 1250mg  IV every 12 hours.  Goal of Therapy:  Vancomycin trough level 10-15 mcg/ml  Plan:  1).  Will begin Vancomycin 1250 mg IV every 12 hours. 2).  Monitor renal function and s/s levels, adjusting as needed. 3).  F/U culture data and match antibiotics as well.  Nadara Mustard, PharmD., MS Clinical Pharmacist Pager:  [phone] Thank you for allowing pharmacy to be part of this  patients care team.  02/26/2013,1:03 PM

## 2013-02-26 NOTE — Progress Notes (Signed)
Subjective: Eating breakfast, sites tender to palpation.  Comfortable sitting on them in bed.  Objective: Vital signs in last 24 hours: Temp:  [98.5 F (36.9 C)-98.8 F (37.1 C)] 98.8 F (37.1 C) (06/09 0500) Pulse Rate:  [68-85] 70 (06/09 0500) Resp:  [15-18] 18 (06/09 0500) BP: (84-95)/(49-62) 84/50 mmHg (06/09 0500) SpO2:  [96 %-100 %] 96 % (06/09 0500) Last BM Date: 02/24/13 Taking a diet: carb modified. Afebrile, VSS, BP 80-90 range, eating breakfast.  No distress. Glucose in the 200's BMP is stable MR back shows:  Small central disc protrusion L4-5. Moderate left paracentral disc protrusion L5-S1 with flattening of the left S1 nerve root.      Intake/Output from previous day: 06/08 0701 - 06/09 0700 In: 1105 [P.O.:480; I.V.:375; IV Piggyback:250] Out: -  Intake/Output this shift:    General appearance: alert, cooperative and no distress Skin: Skin color, texture, turgor normal. No rashes or lesions or she has several areas on her buttocks, one is draining, all are superficail.    Lab Results:   Recent Labs  02/23/13 1145  WBC 8.6  HGB 15.0  HCT 42.5  PLT 145*    BMET  Recent Labs  02/25/13 1544 02/26/13 0605  NA 137 141  K 3.5 3.5  CL 105 104  CO2 24 30  GLUCOSE 277* 199*  BUN 6 6  CREATININE 0.44* 0.36*  CALCIUM 8.4 8.5   PT/INR No results found for this basename: LABPROT, INR,  in the last 72 hours   Recent Labs Lab 02/23/13 1145  AST 7  ALT 7  ALKPHOS 135*  BILITOT 0.4  PROT 7.3  ALBUMIN 3.6     Lipase  No results found for this basename: lipase     Studies/Results: Mr Lumbar Spine W Wo Contrast  02/26/2013   *RADIOLOGY REPORT*  Clinical Data: Left leg weakness.  Diabetes  MRI LUMBAR SPINE WITHOUT AND WITH CONTRAST  Technique:  Multiplanar and multiecho pulse sequences of the lumbar spine were obtained without and with intravenous contrast.  Contrast: 15mL MULTIHANCE GADOBENATE DIMEGLUMINE 529 MG/ML IV SOLN  Comparison: None.   Findings: Image quality degraded by mild to moderate motion.  Normal lumbar alignment.  Negative for fracture or mass lesion.  No acute bone marrow process.  Normal enhancement without evidence of tumor or infection in the lumbar spine.  Conus medullaris is normal and terminates at L1.  L1-2:  Negative  L2-3:  Negative  L3-4:  Negative  L4-5:  Small central disc protrusion without significant spinal stenosis or neural impingement.  L5-S1:  Moderately large left paracentral disc protrusion.  There is mild flattening of the left S1 nerve root which could account for the patient's left leg weakness.  Right S1 nerve root does not show significant impingement.  IMPRESSION: Small central disc protrusion L4-5.  Moderate left paracentral disc protrusion L5-S1 with flattening of the left S1 nerve root.   Original Report Authenticated By: Janeece Riggers, M.D.    Medications: . chlorhexidine   Topical Daily  . insulin aspart  0-15 Units Subcutaneous Q lunch  . insulin aspart protamine- aspart  44 Units Subcutaneous BID WC  . polymixin-bacitracin   Topical BID  . vancomycin  1,250 mg Intravenous Q12H    Assessment/Plan Papulopustular rash on buttock, post. thighs, and lower back @ high risk for MRSA given DM   Plan:  Kpad, wet heat to area, continue antibiotics, if not better will consider I/D, I don't think we should have to do  it but will leave the option open.  Recheck labs in AM.  LOS: 3 days    Woodfin Kiss 02/26/2013

## 2013-02-26 NOTE — Care Management Note (Signed)
    Page 1 of 1   02/28/2013     10:16:02 AM   CARE MANAGEMENT NOTE 02/28/2013  Patient:  Julie [name], Julie [name]   Account Number:  [id number]  Date Initiated:  02/26/2013  Documentation initiated by:  Letha Cape  Subjective/Objective Assessment:   dx dka type 1  admit-lives with family. pta indep.     Action/Plan:   Anticipated DC Date:  02/28/2013   Anticipated DC Plan:  HOME/SELF CARE      DC Planning Services  CM consult      Choice offered to / List presented to:             Status of service:  Completed, signed off Medicare Important Message given?   (If response is "NO", the following Medicare IM given date fields will be blank) Date Medicare IM given:   Date Additional Medicare IM given:    Discharge Disposition:  HOME/SELF CARE  Per UR Regulation:  Reviewed for med. necessity/level of care/duration of stay  If discussed at Long Length of Stay Meetings, dates discussed:    Comments:  02/28/13 10:12 Letha Cape RN, BSN [phone] patient is for dc today, Wannetta Sender gave patient a list of PCP's and patient was supposed to have chosen one.  Also patient will have a medicaid Case Worker who will look inton her home situation ,(  Patient does not have a stove), to see how they can help her.  02/26/13 14:41 Letha Cape RN, BSN [phone] patient lives with family, patient was not sure if she had meidcaid insurance, I called the [facility] office and they state , medicaid is showing active for patient.  I gave patient her medicaid number.  The medicaid onsite Case Manage Lucilla Edin came by to talk to patient.  She states she will leave paper work with patient to pick a PCP and she will check back with patient tomorrow.  Patient is scheduled to go to OR for Follicolitis.

## 2013-02-26 NOTE — Progress Notes (Signed)
Visited with pt as per charge nurses' referral. I introduced myself and my role to the pt. Pt seemed to be in a depressed mood, as per pt's self-disclosure and my observations of her flat affect. Pt discussed her history of grief and loss particularly after the death of her child, and shared the times when she feels depressed. Pt discussed the multiple stressors she continues to experience, particularly the responsibility for financially supporting her family. Pt stated that she does not want to experience feelings of depression. Pt stated that she does not have a support system, and described incidents in her life where she felt taken for granted and dismissed by family and friends in her life. Pt seems to have a weak support system and draws strength from her daughter. Pt's mood shifted when she started talking about her daughter, and how her daughter's liveliness helps to get her through. Pt and I discussed cathartic ways of releasing emotions through expressions such as crying. Pt discussed her concern with taking antidepressants for feeling depressed. I highlighted the pt's strengths and shared them with the pt. Pt stated that she would like for me to visit with her again later today due to the possibility that she may be discharged tomorrow. Pt and I discussed the possibility of going over informed consent paperwork later in the afternoon. I also plan to give pt a list of self-care activities.   Sherol Dade Counselor Intern Haroldine Laws

## 2013-02-26 NOTE — Evaluation (Signed)
Physical Therapy Evaluation Patient Details Name: Julie [name] MRN: [id number] DOB: 09/29/1990 Today's Date: 02/26/2013 Time: 9562-1308 PT Time Calculation (min): 24 min  PT Assessment / Plan / Recommendation Clinical Impression  22 yo female admitted with purulent rash and DKA .She also has weakness in LLE.  Pt presents with deconditioning and decreased balance in gait.  She does not want to use an assistive device, but is unable to walk unassisted. Will bring cane next visit for pt to try.  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    Equipment Recommendations  Other (comment) (to be determined)    Recommendations for Other Services     Frequency Min 3X/week    Precautions / Restrictions Precautions Precautions: Fall   Pertinent Vitals/Pain Pt c/o pain in back from rash      Mobility  Bed Mobility Bed Mobility: Rolling Right;Rolling Left;Supine to Sit;Sit to Supine Rolling Right: 7: Independent Rolling Left: 7: Independent Supine to Sit: 7: Independent Sit to Supine: 7: Independent Details for Bed Mobility Assistance: pt tends to move slowly; c/o dizziness Transfers Transfers: Sit to Stand;Stand to Sit Sit to Stand: 7: Independent Stand to Sit: 7: Independent Details for Transfer Assistance: pt tends to reach out to assist herself with UE, but is able to sit <> stand without hand support Ambulation/Gait Ambulation/Gait Assistance: 4: Min assist Ambulation Distance (Feet): 75 Feet (repeated x 2) Assistive device: 1 person hand held assist Ambulation/Gait Assistance Details: pt walks slowly, c/o dizziness, has  decreased arm swing Gait Pattern: Decreased step length - right;Decreased step length - left;Trunk flexed Gait velocity: decr General Gait Details: pt tends to keep head down, trunk flexed, needs cues to stand erect and swing arms for extra balance Stairs: No Wheelchair Mobility Wheelchair Mobility: No    Exercises General Exercises - Lower Extremity Gluteal Sets: AROM;Both;5 reps Hip ABduction/ADduction: AROM;Both;Sidelying Hip Flexion/Marching: AROM;Both;5 reps;Supine;Limitations Hip Flexion/Marching Limitations: pt reports LLE feels "heavier" Heel Raises: AROM;Both;5 reps;Standing Other Exercises Other Exercises: abd sets Other Exercises: repeated sit to stand x 3 reps   PT Diagnosis: Difficulty walking;Abnormality of gait;Generalized weakness  PT Problem List: Decreased strength;Decreased activity tolerance;Decreased balance PT Treatment Interventions: DME instruction;Gait training;Therapeutic exercise;Stair training   PT Goals Acute Rehab PT Goals PT Goal Formulation: With patient Time For Goal Achievement: 03/05/13 Potential to Achieve Goals: Good Pt will Ambulate: >150 feet;Independently;with least restrictive assistive device PT Goal: Ambulate - Progress: Goal set today Pt will Go Up / Down Stairs: 3-5 stairs;with min assist PT Goal: Up/Down Stairs - Progress: Goal set today Pt will Perform Home Exercise Program: Independently PT Goal: Perform Home Exercise Program - Progress: Goal set today  Visit Information  Last PT Received On: 02/26/13 Assistance Needed: +1    Subjective Data  Subjective: "I'm dizzy" Patient Stated Goal: pt does not want to use a cane or any other device   Prior Functioning  Home Living Lives With: Friend(s) ( pt lives with 25 year old daughter and a friend) Available Help at Discharge: Other (Comment) (mom lives 25 min away and may not be able to help) Type  of Home: House Home Access: Stairs to enter [employer] of Steps:  3 Entrance Stairs-Rails: None Home Layout: One level Home Adaptive Equipment: None Prior Function Level of Independence: Independent Able to Take Stairs?: Yes Communication Communication: No difficulties (pt doesn't talk much)    Cognition  Cognition Arousal/Alertness: Awake/alert Behavior During Therapy: WFL for tasks assessed/performed Overall Cognitive Status: Within Functional Limits for tasks assessed    Extremity/Trunk Assessment Right Lower Extremity Assessment RLE ROM/Strength/Tone: [facility] for tasks assessed;Deficits RLE ROM/Strength/Tone Deficits: do not feel isometric testing in accurate Left Lower Extremity Assessment LLE ROM/Strength/Tone: Deficits LLE ROM/Strength/Tone Deficits: pt states this leg feels "heavy" with knee to chest activity.  she was able to activate all muscles in functional movement including heel raises in standing, but all movements with LLE were slower, more difficult for patient to complete Trunk Assessment Trunk Assessment: Other exceptions Trunk Exceptions: Pt with rash on low back, buttocks (not fully examined) She reports any pain she has is from the rash   Balance Balance Balance Assessed: Yes Static Sitting Balance Static Sitting - Balance Support: No upper extremity supported Static Sitting - Level of Assistance: 7: Independent Static Standing Balance Static Standing - Balance Support: No upper extremity supported Static Standing - Level of Assistance: 7: Independent Static Standing - Comment/# of Minutes: pt is only able to stand  about 30 sec.  End of Session PT - End of Session Activity Tolerance: Patient limited by fatigue Patient left: in chair Nurse Communication: Mobility status  GP   Bayard Hugger. Manson Passey, PT [phone] 02/26/2013, 10:19 AM

## 2013-02-26 NOTE — Progress Notes (Signed)
I have seen and examined the patient and agree with the assessment and plans.  Chastidy Ranker A. Terriana Barreras  MD, FACS  

## 2013-02-26 NOTE — Progress Notes (Signed)
Pt was taught how to draw up her own dose of insulin in the syringe and how to self administer it. Pt pulled insulin up via syringe and self injected it first and then pt pulled up the insulin from the vial and injected her own dose of 70/30 in her abdomen. Pt's self administration went well once she got over her fear of the needle.

## 2013-02-26 NOTE — Progress Notes (Signed)
Inpatient Diabetes Program Recommendations  AACE/ADA: New Consensus Statement on Inpatient Glycemic Control (2013)  Target Ranges:  Prepandial:   less than 140 mg/dL      Peak postprandial:   less than 180 mg/dL (1-2 hours)      Critically ill patients:  140 - 180 mg/dL     Results for LEONARD, HENDLER (MRN [id number]) as of 02/26/2013 13:06  Ref. Range 02/26/2013 08:01 02/26/2013 11:21  Glucose-Capillary Latest Range: 70-99 mg/dL 409 (H) 811 (H)    Admitted with DKA.  Per MD note on H&P, patient stopped taking her insulin for about 1 month due to her inability to afford her insulin.  Glucose on admit was 767 mg/dl with a CO2 of 11. Patient placed on IV insulin drip and hydrated and eventually transitioned to SQ insulin.  **Noted DM Coordinator spent a substantial amount of time with patient during her last admission in February when she was diagnosed with DM (see her numerous notes).  Patient never followed up with a PCP even though she was given several resources to find a PCP  **Spoke with patient about her DM regimen at home.  Patient stated she stopped taking insulin about 1 month ago b/c she could not afford it.  States her insulin costs her $80 per vial at [employer].  Reminded patient that she can switch her insulin Rx to [facility] at any time and get the 70/30 insulin for $25 per vial (instead of $80).  Patient stated she still has fears about giving herself injections.  Will ask RNs to provide additional education and reinforcement with patient at bedside.  When asked if patient has any questions about her DM self care, patient stated she just wants to know if she has Medicaid coverage.  Told patient I will talk to Care management to see if her Medicaid is active.    **When I spoke with care management today, care manager Debra told me she had spoken with the financial counselor and that the financial counselor stated that the patient's Medicaid is indeed active.  This means that  patient can get most of her Rxs for $3.  Though Lantus and Novolog would provide more flexible coverage, I do not think patient is mentally ready to take on more responsibility and provide herself with 4 shots of insulin per day.  Would leave patient on 70/30 insulin bid with meals and have her follow up with Dr. Laural Benes at the [facility].   Will follow. Ambrose Finland RN, MSN, CDE Diabetes Coordinator Inpatient Diabetes Program [phone]

## 2013-02-26 NOTE — Progress Notes (Signed)
Utilization review complete. Christianne Zacher RN CCM Case Mgmt phone [phone] 

## 2013-02-27 DIAGNOSIS — L0231 Cutaneous abscess of buttock: Secondary | ICD-10-CM

## 2013-02-27 DIAGNOSIS — E876 Hypokalemia: Secondary | ICD-10-CM

## 2013-02-27 DIAGNOSIS — R29898 Other symptoms and signs involving the musculoskeletal system: Secondary | ICD-10-CM

## 2013-02-27 DIAGNOSIS — E1010 Type 1 diabetes mellitus with ketoacidosis without coma: Secondary | ICD-10-CM

## 2013-02-27 DIAGNOSIS — R21 Rash and other nonspecific skin eruption: Secondary | ICD-10-CM

## 2013-02-27 DIAGNOSIS — L03317 Cellulitis of buttock: Secondary | ICD-10-CM

## 2013-02-27 LAB — GLUCOSE, CAPILLARY: Glucose-Capillary: 313 mg/dL — ABNORMAL HIGH (ref 70–99)

## 2013-02-27 LAB — COMPREHENSIVE METABOLIC PANEL
AST: 14 U/L (ref 0–37)
Albumin: 2.5 g/dL — ABNORMAL LOW (ref 3.5–5.2)
Alkaline Phosphatase: 81 U/L (ref 39–117)
BUN: 18 mg/dL (ref 6–23)
Chloride: 101 mEq/L (ref 96–112)
Potassium: 4.1 mEq/L (ref 3.5–5.1)
Total Bilirubin: 0.2 mg/dL — ABNORMAL LOW (ref 0.3–1.2)

## 2013-02-27 LAB — CBC
HCT: 35.7 % — ABNORMAL LOW (ref 36.0–46.0)
Platelets: 143 10*3/uL — ABNORMAL LOW (ref 150–400)
RBC: 3.99 MIL/uL (ref 3.87–5.11)
RDW: 15.2 % (ref 11.5–15.5)
WBC: 5.5 10*3/uL (ref 4.0–10.5)

## 2013-02-27 LAB — VANCOMYCIN, TROUGH: Vancomycin Tr: <5 ug/mL — ABNORMAL LOW (ref 10.0–20.0)

## 2013-02-27 MED ORDER — LIDOCAINE-EPINEPHRINE 2 %-1:100000 IJ SOLN
20.0000 mL | Freq: Once | INTRAMUSCULAR | Status: DC
  Filled 2013-02-27: qty 20

## 2013-02-27 MED ORDER — VANCOMYCIN HCL 10 G IV SOLR
1250.0000 mg | Freq: Three times a day (TID) | INTRAVENOUS | Status: DC
  Administered 2013-02-27 – 2013-02-28 (×3): 1250 mg via INTRAVENOUS
  Filled 2013-02-27 (×5): qty 1250

## 2013-02-27 MED ORDER — INSULIN ASPART 100 UNIT/ML ~~LOC~~ SOLN
0.0000 [IU] | Freq: Three times a day (TID) | SUBCUTANEOUS | Status: DC
  Administered 2013-02-27 – 2013-02-28 (×3): 15 [IU] via SUBCUTANEOUS
  Administered 2013-02-28: 4 [IU] via SUBCUTANEOUS

## 2013-02-27 MED ORDER — LIDOCAINE-EPINEPHRINE 2 %-1:100000 IJ SOLN
30.0000 mL | Freq: Once | INTRAMUSCULAR | Status: DC
  Filled 2013-02-27: qty 30

## 2013-02-27 MED ORDER — IBUPROFEN 400 MG PO TABS
400.0000 mg | ORAL_TABLET | Freq: Four times a day (QID) | ORAL | Status: DC | PRN
  Filled 2013-02-27: qty 1

## 2013-02-27 MED ORDER — GABAPENTIN 100 MG PO CAPS
100.0000 mg | ORAL_CAPSULE | Freq: Two times a day (BID) | ORAL | Status: DC
  Administered 2013-02-27 – 2013-02-28 (×3): 100 mg via ORAL
  Filled 2013-02-27 (×4): qty 1

## 2013-02-27 MED ORDER — INSULIN ASPART PROT & ASPART (70-30 MIX) 100 UNIT/ML ~~LOC~~ SUSP
54.0000 [IU] | Freq: Every day | SUBCUTANEOUS | Status: DC
  Administered 2013-02-28: 54 [IU] via SUBCUTANEOUS
  Filled 2013-02-27: qty 10

## 2013-02-27 MED ORDER — INSULIN ASPART PROT & ASPART (70-30 MIX) 100 UNIT/ML ~~LOC~~ SUSP
48.0000 [IU] | Freq: Every day | SUBCUTANEOUS | Status: DC
  Administered 2013-02-27: 48 [IU] via SUBCUTANEOUS
  Filled 2013-02-27: qty 10

## 2013-02-27 NOTE — Progress Notes (Signed)
I have seen and examined the patient and agree with the assessment and plans.  Dolan Xia A. Javarious Elsayed  MD, FACS  

## 2013-02-27 NOTE — Progress Notes (Signed)
Physical Therapy Treatment Patient Details Name: Julie [name] MRN: [id number] DOB: 13-May-1991 Today's Date: 02/27/2013 Time: 4098-1191 PT Time Calculation (min): 18 min  PT Assessment / Plan / Recommendation Comments on Treatment Session  pt improved in gait with cuing, but still has impaired pattern at times.  She was not able to use straight cane effectively to help her gait, therefore do not recommend cane for home.    Follow Up Recommendations  Outpatient PT ([place] eventually)     Does the patient have the potential to tolerate intense rehabilitation     Barriers to Discharge        Equipment Recommendations  None recommended by PT    Recommendations for Other Services    Frequency Min 3X/week   Plan Discharge plan remains appropriate    Precautions / Restrictions Restrictions Weight Bearing Restrictions: No   Pertinent Vitals/Pain C/o some pain in lower left leg and dizziness    Mobility  Bed Mobility Bed Mobility: Rolling Right;Rolling Left;Supine to Sit;Sit to Supine Rolling Right: 7: Independent Rolling Left: 7: Independent Supine to Sit: 7: Independent Sit to Supine: 7: Independent Details for Bed Mobility Assistance: pt tends to move slowly Transfers Transfers: Sit to Stand;Stand to Sit Sit to Stand: 7: Independent Stand to Sit: 7: Independent Details for Transfer Assistance: pt tends to reach out to assist herself with UE, but is able to sit <> stand without hand support Ambulation/Gait Ambulation/Gait Assistance: 4: Min assist;5: Supervision Ambulation Distance (Feet): 250 Feet Assistive device: Straight cane;None Ambulation/Gait Assistance Details: Trial of use of straight cane, but pt does not want to use it. She was unable to achieve a comfortable cadence with it.  Encouraged  to walk without it and have good trunk extension, arm swing and step length.  She was able to do well without device when I was cuing her, but tended to revert back  to impaired gait pattern on her own. She tended to grab handrails. Gait Pattern: Decreased step length - right;Decreased step length - left;Trunk flexed Gait velocity: decr General Gait Details: Variable gait pattern, but pt able to change speeds, correct step length and posture with cues Stairs: No Stairs Assistance: 6: Modified independent (Device/Increase time) Stair Management Technique: One rail Right Number of Stairs: 3 Wheelchair Mobility Wheelchair Mobility: No    Exercises     PT Diagnosis:    PT Problem List:   PT Treatment Interventions:     PT Goals Acute Rehab PT Goals PT Goal Formulation: With patient Time For Goal Achievement: 03/05/13 Potential to Achieve Goals: Good Pt will Ambulate: >150 feet;Independently;with least restrictive assistive device PT Goal: Ambulate - Progress: Progressing toward goal Pt will Go Up / Down Stairs: 3-5 stairs;with min assist PT Goal: Up/Down Stairs - Progress: Progressing toward goal Pt will Perform Home Exercise Program: Independently  Visit Information  Last PT Received On: 02/27/13 Assistance Needed: +1    Subjective Data  Subjective: " I don't like it"  re: straight cane Patient Stated Goal: pt does not want to use a cane or any other device   Cognition  Cognition Arousal/Alertness: Awake/alert Behavior During Therapy: WFL for tasks assessed/performed Overall Cognitive Status: Within Functional Limits for tasks assessed    Balance  Balance Balance Assessed: Yes Static Sitting Balance Static Sitting - Balance Support: No upper extremity supported Static Sitting - Level of Assistance: 7: Independent Static Standing Balance Static Standing - Balance Support: No upper extremity supported;During functional activity Static Standing - Level of Assistance:  7: Independent High Level Balance High Level Balance Activites: Direction changes;Turns;Sudden stops  End of Session PT - End of Session Activity Tolerance: Patient  limited by fatigue Patient left: in chair   GP    Rosey Bath K. Manson Passey, [place] [phone] 02/27/2013, 10:28 AM

## 2013-02-27 NOTE — Progress Notes (Signed)
[facility] PROGRESS NOTE  Julie [name] WUJ:[id number] DOB: [birth year] DOA: 02/23/2013 PCP: No primary provider on file.  Assessment/Plan: 1. DKA -resolved. Off insulin drip -now on SQ insulin  2. Diabetes mellitus -determine type I or type II- will send off antibodies (anti-GAD antibodies and anti-islet ) -increased am insulin to 54 units and pm dose to 48 units -A1C this admission 13.9 -extensive diabetic education including insulin admnistration has been reviewed  3. Folliculitis -continue vanc -consult surgery to see if I&D necessary  4. Probable diabetic neuropathy - start neurontin  5. LLE weakness -likely from diabetic neuropathy -MRI of LS-spine revealed mild impingement of S1 nerve root -pt does not have any back pain -outpatient neurology followup   Code Status:Full Code Family Communication: None at bedside Disposition Plan: Home   Consultants:  Surgery  Procedures:  None, possibly bedside I&D  Antibiotics:  IV vanc  HPI/Subjective: Pt understands diagnosis of diabetes and administration of insulin. She continues to complain of burning sensation in her legs.   Objective: Filed Vitals:   02/26/13 1300 02/26/13 1447 02/26/13 2135 02/27/13 0523  BP: 90/52 105/68 110/63 90/58  Pulse: 89 99 91 96  Temp: 98.8 F (37.1 C) 98.7 F (37.1 C) 98.3 F (36.8 C) 99.7 F (37.6 C)  TempSrc: Oral Oral Oral Oral  Resp: 16 16 16 16   Height:      Weight:      SpO2: 98% 99% 99% 96%    Intake/Output Summary (Last 24 hours) at 02/27/13 1024 Last data filed at 02/26/13 1300  Gross per 24 hour  Intake    890 ml  Output      0 ml  Net    890 ml   Filed Weights   02/23/13 1108 02/23/13 1656  Weight: 55.339 kg (122 lb) 61.1 kg (134 lb 11.2 oz)    Exam:   General:  Alert and oriented female lying in bed in no apparent distress  Cardiovascular: rrr without m/g/r  Respiratory: ctab  Abdomen: soft, non-tender, non distended,  normoactive bs  Neurologic: LLE 4/5, rest 5/5  Skin: multiple 2 cm x 2cm erythematous indurated boils in gluteal region.   Data Reviewed: Basic Metabolic Panel:  Recent Labs Lab 02/23/13 1145 02/23/13 1739  02/24/13 1245 02/25/13 0340 02/25/13 1544 02/26/13 0605 02/27/13 0505  NA 127* 135  < > 132* 136 137 141 137  K 3.4* 2.5*  < > 2.5* 2.9* 3.5 3.5 4.1  CL 93* 105  < > 102 106 105 104 101  CO2 11* 15*  < > 19 24 24 30 30   GLUCOSE 767* 282*  < > 209* 225* 277* 199* 361*  BUN 10 6  < > 4* 6 6 6 18   CREATININE 0.40* 0.30*  < > 0.34* 0.29* 0.44* 0.36* 0.36*  CALCIUM 8.9 7.7*  < > 8.1* 8.2* 8.4 8.5 8.1*  MG  --  1.5  --  1.7  --   --   --   --   PHOS  --  1.5*  --   --   --   --   --   --   < > = values in this interval not displayed. Liver Function Tests:  Recent Labs Lab 02/23/13 1145 02/27/13 0505  AST 7 14  ALT 7 9  ALKPHOS 135* 81  BILITOT 0.4 0.2*  PROT 7.3 5.8*  ALBUMIN 3.6 2.5*   No results found for this basename: LIPASE, AMYLASE,  in the last 168 hours  No results found for this basename: AMMONIA,  in the last 168 hours CBC:  Recent Labs Lab 02/23/13 1145 02/27/13 0505  WBC 8.6 5.5  NEUTROABS 6.3  --   HGB 15.0 12.1  HCT 42.5 35.7*  MCV 88.4 89.5  PLT 145* 143*   Cardiac Enzymes: No results found for this basename: CKTOTAL, CKMB, CKMBINDEX, TROPONINI,  in the last 168 hours BNP (last 3 results) No results found for this basename: PROBNP,  in the last 8760 hours CBG:  Recent Labs Lab 02/26/13 0801 02/26/13 1121 02/26/13 1734 02/26/13 2143 02/27/13 0754  GLUCAP 200* 163* 327* 326* 313*    Recent Results (from the past 240 hour(s))  WOUND CULTURE     Status: None   Collection Time    02/23/13  2:10 PM      Result Value Range Status   Specimen Description LEG   Final   Special Requests Normal   Final   Gram Stain     Final   Value: NO WBC SEEN     NO SQUAMOUS EPITHELIAL CELLS SEEN     FEW GRAM POSITIVE COCCI IN PAIRS   Culture      Final   Value: MODERATE STAPHYLOCOCCUS AUREUS     Note: RIFAMPIN AND GENTAMICIN SHOULD NOT BE USED AS SINGLE DRUGS FOR TREATMENT OF STAPH INFECTIONS.   Report Status 02/26/2013 FINAL   Final   Organism ID, Bacteria STAPHYLOCOCCUS AUREUS   Final  MRSA PCR SCREENING     Status: None   Collection Time    02/23/13  5:16 PM      Result Value Range Status   MRSA by PCR NEGATIVE  NEGATIVE Final   Comment:            The GeneXpert MRSA Assay (FDA     approved for NASAL specimens     only), is one component of a     comprehensive MRSA colonization     surveillance program. It is not     intended to diagnose MRSA     infection nor to guide or     monitor treatment for     MRSA infections.  CULTURE, BLOOD (ROUTINE X 2)     Status: None   Collection Time    02/23/13  5:30 PM      Result Value Range Status   Specimen Description BLOOD LEFT ARM   Final   Special Requests BOTTLES DRAWN AEROBIC AND ANAEROBIC 10CC   Final   Culture  Setup Time 02/23/2013 23:29   Final   Culture     Final   Value:        BLOOD CULTURE RECEIVED NO GROWTH TO DATE CULTURE WILL BE HELD FOR 5 DAYS BEFORE ISSUING A FINAL NEGATIVE REPORT   Report Status PENDING   Incomplete  CULTURE, BLOOD (ROUTINE X 2)     Status: None   Collection Time    02/23/13  5:40 PM      Result Value Range Status   Specimen Description BLOOD LEFT ARM   Final   Special Requests BOTTLES DRAWN AEROBIC AND ANAEROBIC 10CC   Final   Culture  Setup Time 02/23/2013 23:29   Final   Culture     Final   Value:        BLOOD CULTURE RECEIVED NO GROWTH TO DATE CULTURE WILL BE HELD FOR 5 DAYS BEFORE ISSUING A FINAL NEGATIVE REPORT   Report Status PENDING   Incomplete  Studies: Mr Lumbar Spine W Wo Contrast  02/26/2013   *RADIOLOGY REPORT*  Clinical Data: Left leg weakness.  Diabetes  MRI LUMBAR SPINE WITHOUT AND WITH CONTRAST  Technique:  Multiplanar and multiecho pulse sequences of the lumbar spine were obtained without and with intravenous contrast.   Contrast: 15mL MULTIHANCE GADOBENATE DIMEGLUMINE 529 MG/ML IV SOLN  Comparison: None.  Findings: Image quality degraded by mild to moderate motion.  Normal lumbar alignment.  Negative for fracture or mass lesion.  No acute bone marrow process.  Normal enhancement without evidence of tumor or infection in the lumbar spine.  Conus medullaris is normal and terminates at L1.  L1-2:  Negative  L2-3:  Negative  L3-4:  Negative  L4-5:  Small central disc protrusion without significant spinal stenosis or neural impingement.  L5-S1:  Moderately large left paracentral disc protrusion.  There is mild flattening of the left S1 nerve root which could account for the patient's left leg weakness.  Right S1 nerve root does not show significant impingement.  IMPRESSION: Small central disc protrusion L4-5.  Moderate left paracentral disc protrusion L5-S1 with flattening of the left S1 nerve root.   Original Report Authenticated By: Janeece Riggers, M.D.    Scheduled Meds: . chlorhexidine   Topical Daily  . enoxaparin (LOVENOX) injection  40 mg Subcutaneous Daily  . insulin aspart  0-15 Units Subcutaneous Q lunch  . insulin aspart protamine- aspart  44 Units Subcutaneous BID WC  . lidocaine-EPINEPHrine  20 mL Intradermal Once  . polymixin-bacitracin   Topical BID  . vancomycin  1,250 mg Intravenous Q12H   Continuous Infusions:   Principal Problem:   DKA, type 1 Active Problems:   Hypokalemia   Pustular rash   Left leg weakness   Maris Berger  [facility] If 7PM-7AM, please contact night-coverage at [email or web address], password [facility] 02/27/2013, 10:24 AM  LOS: 4 days   Attending Patient seen and examined, CBG's still significantly elevated, will increase Insulin as noted above. Await [facility] follow up today to see if I&D is needed-otherwise will discharge home later today.   SGhimire

## 2013-02-27 NOTE — Procedures (Signed)
Incision and Drainage Procedure Note  Pre-operative Diagnosis: skin abscess  Post-operative Diagnosis: infected skin abscess, simple  Indications: pain and swelling  Anesthesia: Lidocaine 2% with epinephrine   Assist:  Will Marlyne Beards, PA-C  Procedure Details  The procedure, risks and complications have been discussed in detail (including, but not limited to airway compromise, infection, bleeding) with the patient, and the patient has signed consent to the procedure.  The skin was sterilely prepped and draped over the affected area in the usual fashion. After adequate local anesthesia, I&D with a #11 blade was performed on the left gluteal region.  There were 2 distinct abscesses 1cm area of induration that were drained.  Incisions were superficial 1cm in length.   There was a small amount of purulent drainage, followed by minimal bleeding.  The wound was packed with iodoform following hemostasis.  The patient was observed until stable.  Findings: Infected abscess  EBL: 5ml  Drains: none  Condition: Stable   Complications: none.  Joevon Holliman, ANP-BC

## 2013-02-27 NOTE — Progress Notes (Signed)
ANTIBIOTIC CONSULT NOTE - FOLLOW UP  Pharmacy Consult for Vancomycin Indication: folliculitis/cellulitis  No Known Allergies  Patient Measurements: Height: 5\' 10"  (177.8 cm) Weight: 134 lb 11.2 oz (61.1 kg) IBW/kg (Calculated) : 68.5 Adjusted Body Weight:   Vital Signs: Temp: 99.7 F (37.6 C) (06/10 0523) Temp src: Oral (06/10 0523) BP: 90/58 mmHg (06/10 0523) Pulse Rate: 96 (06/10 0523) Intake/Output from previous day: 06/09 0701 - 06/10 0700 In: 1370 [P.O.:1370] Out: -  Intake/Output from this shift:    Labs:  Recent Labs  02/25/13 1544 02/26/13 0605 02/27/13 0505  WBC  --   --  5.5  HGB  --   --  12.1  PLT  --   --  143*  CREATININE 0.44* 0.36* 0.36*   Estimated Creatinine Clearance: 106.4 ml/min (by C-G formula based on Cr of 0.36).  Recent Labs  02/27/13 1307  VANCOTROUGH <5.0*     Microbiology: Recent Results (from the past 720 hour(s))  WOUND CULTURE     Status: None   Collection Time    02/23/13  2:10 PM      Result Value Range Status   Specimen Description LEG   Final   Special Requests Normal   Final   Gram Stain     Final   Value: NO WBC SEEN     NO SQUAMOUS EPITHELIAL CELLS SEEN     FEW GRAM POSITIVE COCCI IN PAIRS   Culture     Final   Value: MODERATE STAPHYLOCOCCUS AUREUS     Note: RIFAMPIN AND GENTAMICIN SHOULD NOT BE USED AS SINGLE DRUGS FOR TREATMENT OF STAPH INFECTIONS.   Report Status 02/26/2013 FINAL   Final   Organism ID, Bacteria STAPHYLOCOCCUS AUREUS   Final  MRSA PCR SCREENING     Status: None   Collection Time    02/23/13  5:16 PM      Result Value Range Status   MRSA by PCR NEGATIVE  NEGATIVE Final   Comment:            The GeneXpert MRSA Assay (FDA     approved for NASAL specimens     only), is one component of a     comprehensive MRSA colonization     surveillance program. It is not     intended to diagnose MRSA     infection nor to guide or     monitor treatment for     MRSA infections.  CULTURE, BLOOD (ROUTINE  X 2)     Status: None   Collection Time    02/23/13  5:30 PM      Result Value Range Status   Specimen Description BLOOD LEFT ARM   Final   Special Requests BOTTLES DRAWN AEROBIC AND ANAEROBIC 10CC   Final   Culture  Setup Time 02/23/2013 23:29   Final   Culture     Final   Value:        BLOOD CULTURE RECEIVED NO GROWTH TO DATE CULTURE WILL BE HELD FOR 5 DAYS BEFORE ISSUING A FINAL NEGATIVE REPORT   Report Status PENDING   Incomplete  CULTURE, BLOOD (ROUTINE X 2)     Status: None   Collection Time    02/23/13  5:40 PM      Result Value Range Status   Specimen Description BLOOD LEFT ARM   Final   Special Requests BOTTLES DRAWN AEROBIC AND ANAEROBIC 10CC   Final   Culture  Setup Time 02/23/2013 23:29   Final  Culture     Final   Value:        BLOOD CULTURE RECEIVED NO GROWTH TO DATE CULTURE WILL BE HELD FOR 5 DAYS BEFORE ISSUING A FINAL NEGATIVE REPORT   Report Status PENDING   Incomplete    Anti-infectives   Start     Dose/Rate Route Frequency Ordered Stop   02/24/13 0100  vancomycin (VANCOCIN) 1,250 mg in sodium chloride 0.9 % 250 mL IVPB     1,250 mg 166.7 mL/hr over 90 Minutes Intravenous Every 12 hours 02/23/13 1721     02/23/13 1230  vancomycin (VANCOCIN) IVPB 1000 mg/200 mL premix     1,000 mg 200 mL/hr over 60 Minutes Intravenous  Once 02/23/13 1227 02/23/13 1410      Assessment: 22yof on Vancomycin Day 4 for folliculitis/cellulitis. Surgical team is scheduled to see her today and may perform I&D of site. Patient has remained afebrile and WBC wnl. Wound cultured was collected and reported moderate MSSA - recommended to MD to de-escalate therapy (doxycycline, keflex, bactrim) but MD is deferring for now. Vancomycin trough was drawn this afternoon and is subtherapeutic (<5 mcg/ml) - will adjust to q8h regimen and follow-up antibiotic narrowing.  - Weight: 61kg - Crcl >100  Goal of Therapy:  Vancomycin trough level 10-15 mcg/ml  Plan:  1. Change Vancomycin to 1.25g IV  q8h 2. Follow-up surgical plans and narrowing of antibiotics  Julie [name] [phone] 02/27/2013,2:25 PM

## 2013-02-27 NOTE — Progress Notes (Signed)
Spoke to [employer] about ordering xylocaine with epi. He will place the order and I will retrieve it from pharmacy. All other supplies are at bedside now.

## 2013-02-27 NOTE — Progress Notes (Signed)
Inpatient Diabetes Program Recommendations  AACE/ADA: New Consensus Statement on Inpatient Glycemic Control (2013)  Target Ranges:  Prepandial:   less than 140 mg/dL      Peak postprandial:   less than 180 mg/dL (1-2 hours)      Critically ill patients:  140 - 180 mg/dL    Patient has appointment to get established with a PCP at the [facility] on June 18th at [place].  Called patient to discuss this appointment.  Appointment placed on AVS.    Will follow. Ambrose Finland RN, MSN, CDE Diabetes Coordinator Inpatient Diabetes Program [phone]

## 2013-02-27 NOTE — Progress Notes (Signed)
Physical Therapy Treatment Patient Details Name: Julie [name] MRN: [id number] DOB: 12-Apr-1991 Today's Date: 02/27/2013 Time: 4696-2952 PT Time Calculation (min): 15 min  PT Assessment / Plan / Recommendation Comments on Treatment Session  Pt continues to improve in mobility and understands home program.  Feel she will continue to improve with increased mobility at home.  Do not feel pt will need outpatient PT at discharge    Follow Up Recommendations  No PT follow up (possilby eventually)     Does the patient have the potential to tolerate intense rehabilitation     Barriers to Discharge        Equipment Recommendations  None recommended by PT    Recommendations for Other Services    Frequency Min 3X/week   Plan Discharge plan needs to be updated    Precautions / Restrictions Restrictions Weight Bearing Restrictions: No   Pertinent Vitals/Pain C/o pain in left leg    Mobility  Bed Mobility Bed Mobility: Rolling Right;Rolling Left;Supine to Sit;Sit to Supine Rolling Right: 7: Independent Rolling Left: 7: Independent Supine to Sit: 7: Independent Sit to Supine: 7: Independent Details for Bed Mobility Assistance: pt tends to move slowly Transfers Transfers: Sit to Stand;Stand to Sit Sit to Stand: 7: Independent Stand to Sit: 7: Independent Details for Transfer Assistance: pt tends to reach out to assist herself with UE, but is able to sit <> stand without hand support Ambulation/Gait Ambulation/Gait Assistance: 7: Independent Ambulation Distance (Feet): 100 Feet Assistive device: None Ambulation/Gait Assistance Details: pt holds her daughter's hand while walking Gait Pattern: Decreased step length - right;Decreased step length - left Gait velocity: decr General Gait Details: pt still with pain in left leg today Stairs: No Stairs Assistance: 6: Modified independent (Device/Increase time) Stair Management Technique: One rail Right Number of Stairs:  3 Wheelchair Mobility Wheelchair Mobility: No    Exercises Other Exercises Other Exercises: pt issued written exercise program for beginning core and LE exercise.  She acknowledges she understands them and can do them all.  chart copy placed in shadow chart   PT Diagnosis:    PT Problem List:   PT Treatment Interventions:     PT Goals Acute Rehab PT Goals PT Goal Formulation: With patient Time For Goal Achievement: 03/05/13 Potential to Achieve Goals: Good Pt will Ambulate: >150 feet;Independently;with least restrictive assistive device PT Goal: Ambulate - Progress: Met Pt will Go Up / Down Stairs: 3-5 stairs;with min assist PT Goal: Up/Down Stairs - Progress: Met Pt will Perform Home Exercise Program: Independently PT Goal: Perform Home Exercise Program - Progress: Met  Visit Information  Last PT Received On: 02/27/13 Assistance Needed: +1    Subjective Data  Subjective: It still hurts Patient Stated Goal: pt agrees she will do exercise and stay active at home   Cognition  Cognition Arousal/Alertness: Awake/alert Behavior During Therapy: WFL for tasks assessed/performed Overall Cognitive Status: Within Functional Limits for tasks assessed    Balance  Balance Balance Assessed: Yes Static Sitting Balance Static Sitting - Balance Support: No upper extremity supported Static Sitting - Level of Assistance: 7: Independent Static Standing Balance Static Standing - Balance Support: No upper extremity supported;During functional activity Static Standing - Level of Assistance: 7: Independent High Level Balance High Level Balance Activites: Direction changes;Turns;Sudden stops  End of Session PT - End of Session Activity Tolerance: Patient limited by fatigue Patient left: in chair;with family/visitor present   GP     Donnetta Hail 02/27/2013, 1:12 PM

## 2013-02-27 NOTE — Progress Notes (Signed)
No K pads available in hospital at this time.

## 2013-02-27 NOTE — Progress Notes (Signed)
Subjective: Eating breakfast, No kpad or heat yesterday as ordered.    Objective: Vital signs in last 24 hours: Temp:  [98.3 F (36.8 C)-99.7 F (37.6 C)] 99.7 F (37.6 C) (06/10 0523) Pulse Rate:  [87-99] 96 (06/10 0523) Resp:  [16] 16 (06/10 0523) BP: (90-110)/(52-68) 90/58 mmHg (06/10 0523) SpO2:  [96 %-100 %] 96 % (06/10 0523) Last BM Date: 02/24/13 Afebrile, VSS Labs OK glucose up to 361, HBA1C 13.9  Intake/Output from previous day: 06/09 0701 - 06/10 0700 In: 1370 [P.O.:1370] Out: -  Intake/Output this shift:    General appearance: alert, cooperative, no distress and still complains of pain 2 stie on her buttocks. Skin: She has 2 sites that are about 1.5-2 cm on buttocks that are red and have fluid within them and not draining yet.  Lab Results:   Recent Labs  02/27/13 0505  WBC 5.5  HGB 12.1  HCT 35.7*  PLT 143*    BMET  Recent Labs  02/26/13 0605 02/27/13 0505  NA 141 137  K 3.5 4.1  CL 104 101  CO2 30 30  GLUCOSE 199* 361*  BUN 6 18  CREATININE 0.36* 0.36*  CALCIUM 8.5 8.1*   PT/INR No results found for this basename: LABPROT, INR,  in the last 72 hours   Recent Labs Lab 02/23/13 1145 02/27/13 0505  AST 7 14  ALT 7 9  ALKPHOS 135* 81  BILITOT 0.4 0.2*  PROT 7.3 5.8*  ALBUMIN 3.6 2.5*     Lipase  No results found for this basename: lipase     Studies/Results: Mr Lumbar Spine W Wo Contrast  02/26/2013   *RADIOLOGY REPORT*  Clinical Data: Left leg weakness.  Diabetes  MRI LUMBAR SPINE WITHOUT AND WITH CONTRAST  Technique:  Multiplanar and multiecho pulse sequences of the lumbar spine were obtained without and with intravenous contrast.  Contrast: 15mL MULTIHANCE GADOBENATE DIMEGLUMINE 529 MG/ML IV SOLN  Comparison: None.  Findings: Image quality degraded by mild to moderate motion.  Normal lumbar alignment.  Negative for fracture or mass lesion.  No acute bone marrow process.  Normal enhancement without evidence of tumor or  infection in the lumbar spine.  Conus medullaris is normal and terminates at L1.  L1-2:  Negative  L2-3:  Negative  L3-4:  Negative  L4-5:  Small central disc protrusion without significant spinal stenosis or neural impingement.  L5-S1:  Moderately large left paracentral disc protrusion.  There is mild flattening of the left S1 nerve root which could account for the patient's left leg weakness.  Right S1 nerve root does not show significant impingement.  IMPRESSION: Small central disc protrusion L4-5.  Moderate left paracentral disc protrusion L5-S1 with flattening of the left S1 nerve root.   Original Report Authenticated By: Janeece Riggers, M.D.    Medications: . chlorhexidine   Topical Daily  . enoxaparin (LOVENOX) injection  40 mg Subcutaneous Daily  . insulin aspart  0-15 Units Subcutaneous Q lunch  . insulin aspart protamine- aspart  44 Units Subcutaneous BID WC  . polymixin-bacitracin   Topical BID  . vancomycin  1,250 mg Intravenous Q12H    Assessment/Plan Papulopustular rash on buttock, post. thighs, and lower back @ high risk for MRSA given DM   Plan:  She has already eaten breakfast, so going to OR is out.  I will get an I&D set to floor with local and have it ready if Dr. Magnus Ivan decides to go ahead and drain it.  I will see  what happened to Kpad  LOS: 4 days    Julie [name] 02/27/2013

## 2013-02-27 NOTE — Progress Notes (Signed)
NEURO HOSPITALIST PROGRESS NOTE   SUBJECTIVE:                                                                                                                        Feels her left leg feels "burning at the bottom of her feet and up to her mid calf. "   OBJECTIVE:                                                                                                                           Vital signs in last 24 hours: Temp:  [98.3 F (36.8 C)-99.7 F (37.6 C)] 99.7 F (37.6 C) (06/10 0523) Pulse Rate:  [87-99] 96 (06/10 0523) Resp:  [16] 16 (06/10 0523) BP: (90-110)/(52-68) 90/58 mmHg (06/10 0523) SpO2:  [96 %-100 %] 96 % (06/10 0523)  Intake/Output from previous day: 06/09 0701 - 06/10 0700 In: 1370 [P.O.:1370] Out: -  Intake/Output this shift:   Nutritional status: Carb Control  Past Medical History  Diagnosis Date  . Diabetes mellitus without complication      Neurologic Exam:   Mental Status: Alert, oriented, thought content appropriate.  Speech fluent without evidence of aphasia.  Able to follow 3 step commands without difficulty. Cranial Nerves: II: Discs flat bilaterally; Visual fields grossly normal, pupils equal, round, reactive to light and accommodation III,IV, VI: ptosis not present, extra-ocular motions intact bilaterally V,VII: smile symmetric, facial light touch sensation normal bilaterally VIII: hearing normal bilaterally IX,X: gag reflex present XI: bilateral shoulder shrug XII: midline tongue extension Motor: Right : Upper extremity   5/5    Left:     Upper extremity   5/5  Bilateral hip flexion is 4/5 when patient is not focusing on strength testing but when tested individually she does not give full effort and shows give way strength.  Bilateral knee extension is 5/5 when distracted but 4/5 when asked to resist my strength. DF and PF 5/5 with give way strength. Hip abduction and adduction 5/5 Tone and bulk:normal tone  throughout; no atrophy noted Sensory: Pinprick and light touch intact throughout, bilaterally but feels hypesthetic in left foot up to mid calf circumfrentially Deep Tendon Reflexes: 2+ and symmetric throughout Plantars: Right: downgoing   Left: downgoing Cerebellar:  normal finger-to-nose,  normal heel-to-shin test CV: pulses palpable throughout   Lab Results: No results found for this basename: cbc, bmp, coags, chol, tri, ldl, hga1c   Lipid Panel No results found for this basename: CHOL, TRIG, HDL, CHOLHDL, VLDL, LDLCALC,  in the last 72 hours  Studies/Results: Mr Lumbar Spine W Wo Contrast  02/26/2013   *RADIOLOGY REPORT*  Clinical Data: Left leg weakness.  Diabetes  MRI LUMBAR SPINE WITHOUT AND WITH CONTRAST  Technique:  Multiplanar and multiecho pulse sequences of the lumbar spine were obtained without and with intravenous contrast.  Contrast: 15mL MULTIHANCE GADOBENATE DIMEGLUMINE 529 MG/ML IV SOLN  Comparison: None.  Findings: Image quality degraded by mild to moderate motion.  Normal lumbar alignment.  Negative for fracture or mass lesion.  No acute bone marrow process.  Normal enhancement without evidence of tumor or infection in the lumbar spine.  Conus medullaris is normal and terminates at L1.  L1-2:  Negative  L2-3:  Negative  L3-4:  Negative  L4-5:  Small central disc protrusion without significant spinal stenosis or neural impingement.  L5-S1:  Moderately large left paracentral disc protrusion.  There is mild flattening of the left S1 nerve root which could account for the patient's left leg weakness.  Right S1 nerve root does not show significant impingement.  IMPRESSION: Small central disc protrusion L4-5.  Moderate left paracentral disc protrusion L5-S1 with flattening of the left S1 nerve root.   Original Report Authenticated By: Janeece Riggers, M.D.    MEDICATIONS                                                                                                                         Scheduled: . chlorhexidine   Topical Daily  . enoxaparin (LOVENOX) injection  40 mg Subcutaneous Daily  . insulin aspart  0-15 Units Subcutaneous Q lunch  . insulin aspart protamine- aspart  44 Units Subcutaneous BID WC  . polymixin-bacitracin   Topical BID  . vancomycin  1,250 mg Intravenous Q12H    ASSESSMENT/PLAN:                                                                                                            Subacute onset LLE weakness with a neuro-exam that is limited due to lack of full effort on motor testing. Distribution of paresthesia today does not fit S1 dermatome.  Likely symptomatic secondary to uncontrolled DM.  No further in patient recommendations.   Recommend: 1) Out patient NCV/EMG 2) Control of Her BG--Diabetic coordinator has  seen patient . 3) May consider neurosurgical evaluation for S1 left nerve impingement in the future but not during hospitalization.    I have spent 20 minutes discussing the neurological and vascular damage that uncontrolled DM may have on her body.   Neuro s/o   Assessment and plan discussed with with attending physician and they are in agreement.    Felicie Morn PA-C [facility] [phone]  02/27/2013, 9:25 AM   Patient seen and examined together with physician assistant and I concur with the assessment and plan.  Wyatt Portela, MD

## 2013-02-28 LAB — GLUCOSE, CAPILLARY

## 2013-02-28 MED ORDER — DOXYCYCLINE HYCLATE 100 MG PO TABS: 100.0000 mg | ORAL_TABLET | Freq: Two times a day (BID) | ORAL | Status: DC

## 2013-02-28 MED ORDER — INSULIN ASPART 100 UNIT/ML FLEXPEN: 15.0000 [IU] | PEN_INJECTOR | Freq: Three times a day (TID) | SUBCUTANEOUS | Status: DC

## 2013-02-28 MED ORDER — INSULIN PEN NEEDLE 31G X 5 MM MISC: Status: DC

## 2013-02-28 MED ORDER — GABAPENTIN 100 MG PO CAPS: 100.0000 mg | ORAL_CAPSULE | Freq: Two times a day (BID) | ORAL | Status: DC

## 2013-02-28 MED ORDER — DOXYCYCLINE HYCLATE 100 MG PO TABS
100.0000 mg | ORAL_TABLET | Freq: Two times a day (BID) | ORAL | Status: DC
  Administered 2013-02-28: 100 mg via ORAL
  Filled 2013-02-28 (×3): qty 1

## 2013-02-28 MED ORDER — INSULIN GLARGINE 100 UNIT/ML SOLOSTAR PEN: 40.0000 [IU] | PEN_INJECTOR | Freq: Every day | SUBCUTANEOUS | Status: DC

## 2013-02-28 NOTE — Progress Notes (Signed)
I have seen and examined the patient and agree with the assessment and plans.  Julie [name] A. Julie Hargrove  MD, FACS  

## 2013-02-28 NOTE — Discharge Summary (Signed)
Physician Discharge Summary  Julie [name] JXB:[id number] DOB: 08-15-1991 DOA: 02/23/2013  PCP: No primary provider on file.  Admit date: 02/23/2013 Discharge date: 02/28/2013  Time spent: 30 minutes  Recommendations for Outpatient Follow-up:  1. Discussed the need for compliance with insulin on a daily basis for her Diabetes. 2. Patient can follow-up with local clinic as needed for her care. 3. Follow-up with Dr. Magnus Ivan with surgery if needed, especially if wounds are not healing properly. 4. Patient's mother can assist her with packing and dressing her wounds until they heal. 5. Patient is able to shower normally and take warm sitz baths to help with further wound healing.  Discharge Diagnoses:  Principal Problem:   DKA, type 1 Active Problems:   Hypokalemia   Pustular rash   Left leg weakness   Discharge Condition: Patient is stable.  Diet recommendation:  -Patient can continue a regular diet, however she needs to keep track of her sugar levels daily, so they can be under proper control -A diet low in sugar would help in her condition -Adequate amount fluids  Filed Weights   02/23/13 1108 02/23/13 1656  Weight: 55.339 kg (122 lb) 61.1 kg (134 lb 11.2 oz)    History of present illness:  Patient understands her diagnosis of diabetes and the daily need for insulin to keep it under control.  She was educated about administration of insulin and understands its use.  Patient continues to complain of left leg weakness and a "burning" sensation in the left leg, most likely due to diabetic neuropathy.  Sensation in the left foot is decreased.  Surgeon reexamined her wounds after I&D and approved discharged.  Wound care was discussed and patient understood.  Hospital Course:  DKA -resolved. Off insulin drip  -now on SQ insulin   Diabetes mellitus  -determine type I or type II- will send off antibodies (anti-GAD antibodies and anti-islet )  -increased am insulin to 54  units and pm dose to 48 units  -A1C this admission 13.9  -CBG down this am 151 -extensive diabetic education including insulin admnistration has been reviewed   Folliculitis  -I&D at bedside by Dr. Magnus Ivan -wounds reexamined this am, and is cleared for discharge -surgeon spoke with patient about wound care -patient is able to shower and use sitz baths  Probable diabetic neuropathy  - on neurontin   LLE weakness  -likely from diabetic neuropathy  -MRI of LS-spine revealed mild impingement of S1 nerve root  -pt does not have any back pain  -outpatient neurology followup    Procedures: - I&D performed at bedside yesterday by Dr. Magnus Ivan   Consultations:  Surgery to reexamine wounds at bedside after I&D  Discharge Exam: Filed Vitals:   02/27/13 0523 02/27/13 1452 02/27/13 2044 02/28/13 0615  BP: 90/58 106/70 83/54 85/47   Pulse: 96 108 89 74  Temp: 99.7 F (37.6 C) 98.9 F (37.2 C) 99 F (37.2 C) 98.5 F (36.9 C)  TempSrc: Oral Oral Oral Oral  Resp: 16 18 16 16   Height:      Weight:      SpO2: 96% 94% 96% 95%    General: Patient awake, A&O x 3, in no acute distress.  Cardiovascular: RRR, no mgr. Respiratory: lungs clear to auscultation bilaterally; without wheezes, rhonchi, or rales. Abdomen: soft, nontender, nondistended, normoactive BS. Musculoskeletal: active ROM in all 4 extremities; LLE 3/5, rest 5/5.  Neurological: decreased sensation to touch in LLE Skin: two 1 cm x 1 cm drained abscesses drained  in gluteal region free of discharge  Discharge Instructions   Future Appointments Provider Department Dept Phone   03/07/2013 4:00 PM [facility] Covering Provider [facility] [phone]       Medication List    ASK your doctor about these medications       insulin NPH-regular (70-30) 100 UNIT/ML injection  Commonly known as:  NOVOLIN 70/30  Inject 45 Units into the skin 2 (two) times daily with a meal.     metFORMIN 500 MG  tablet  Commonly known as:  GLUCOPHAGE  Take 500 mg by mouth 2 (two) times daily with a meal.       No Known Allergies     Follow-up Information   Follow up with [facility]     On 03/07/2013. (Appointment is for June 18th at 4pm- Please bring your photo ID, All mediacations you take, and make sure to have $3 co-pay )    Contact information:   [address] [place] [address]       Follow up with [facility], MD. Schedule an appointment as soon as possible for a visit in 2 weeks. (As needed,  Call if you have a problem.  Keep it clean and place iodoform into sites till it heals and you can not get the iodoform in the site.)    Contact information:   [address] [place] [address] [phone]        The results of significant diagnostics from this hospitalization (including imaging, microbiology, ancillary and laboratory) are listed below for reference.    Significant Diagnostic Studies: Mr Lumbar Spine W Wo Contrast  02/26/2013   *RADIOLOGY REPORT*  Clinical Data: Left leg weakness.  Diabetes  MRI LUMBAR SPINE WITHOUT AND WITH CONTRAST  Technique:  Multiplanar and multiecho pulse sequences of the lumbar spine were obtained without and with intravenous contrast.  Contrast: 15mL MULTIHANCE GADOBENATE DIMEGLUMINE 529 MG/ML IV SOLN  Comparison: None.  Findings: Image quality degraded by mild to moderate motion.  Normal lumbar alignment.  Negative for fracture or mass lesion.  No acute bone marrow process.  Normal enhancement without evidence of tumor or infection in the lumbar spine.  Conus medullaris is normal and terminates at L1.  L1-2:  Negative  L2-3:  Negative  L3-4:  Negative  L4-5:  Small central disc protrusion without significant spinal stenosis or neural impingement.  L5-S1:  Moderately large left paracentral disc protrusion.  There is mild flattening of the left S1 nerve root which could account for the patient's  left leg weakness.  Right S1 nerve root does not show significant impingement.  IMPRESSION: Small central disc protrusion L4-5.  Moderate left paracentral disc protrusion L5-S1 with flattening of the left S1 nerve root.   Original Report Authenticated By: Janeece Riggers, M.D.    Microbiology: Recent Results (from the past 240 hour(s))  WOUND CULTURE     Status: None   Collection Time    02/23/13  2:10 PM      Result Value Range Status   Specimen Description LEG   Final   Special Requests Normal   Final   Gram Stain     Final   Value: NO WBC SEEN     NO SQUAMOUS EPITHELIAL CELLS SEEN     FEW GRAM POSITIVE COCCI IN PAIRS   Culture     Final   Value: MODERATE STAPHYLOCOCCUS AUREUS     Note:  RIFAMPIN AND GENTAMICIN SHOULD NOT BE USED AS SINGLE DRUGS FOR TREATMENT OF STAPH INFECTIONS.   Report Status 02/26/2013 FINAL   Final   Organism ID, Bacteria STAPHYLOCOCCUS AUREUS   Final  MRSA PCR SCREENING     Status: None   Collection Time    02/23/13  5:16 PM      Result Value Range Status   MRSA by PCR NEGATIVE  NEGATIVE Final   Comment:            The GeneXpert MRSA Assay (FDA     approved for NASAL specimens     only), is one component of a     comprehensive MRSA colonization     surveillance program. It is not     intended to diagnose MRSA     infection nor to guide or     monitor treatment for     MRSA infections.  CULTURE, BLOOD (ROUTINE X 2)     Status: None   Collection Time    02/23/13  5:30 PM      Result Value Range Status   Specimen Description BLOOD LEFT ARM   Final   Special Requests BOTTLES DRAWN AEROBIC AND ANAEROBIC 10CC   Final   Culture  Setup Time 02/23/2013 23:29   Final   Culture     Final   Value:        BLOOD CULTURE RECEIVED NO GROWTH TO DATE CULTURE WILL BE HELD FOR 5 DAYS BEFORE ISSUING A FINAL NEGATIVE REPORT   Report Status PENDING   Incomplete  CULTURE, BLOOD (ROUTINE X 2)     Status: None   Collection Time    02/23/13  5:40 PM      Result Value Range  Status   Specimen Description BLOOD LEFT ARM   Final   Special Requests BOTTLES DRAWN AEROBIC AND ANAEROBIC 10CC   Final   Culture  Setup Time 02/23/2013 23:29   Final   Culture     Final   Value:        BLOOD CULTURE RECEIVED NO GROWTH TO DATE CULTURE WILL BE HELD FOR 5 DAYS BEFORE ISSUING A FINAL NEGATIVE REPORT   Report Status PENDING   Incomplete     Labs: Basic Metabolic Panel:  Recent Labs Lab 02/23/13 1145 02/23/13 1739  02/24/13 1245 02/25/13 0340 02/25/13 1544 02/26/13 0605 02/27/13 0505  NA 127* 135  < > 132* 136 137 141 137  K 3.4* 2.5*  < > 2.5* 2.9* 3.5 3.5 4.1  CL 93* 105  < > 102 106 105 104 101  CO2 11* 15*  < > 19 24 24 30 30   GLUCOSE 767* 282*  < > 209* 225* 277* 199* 361*  BUN 10 6  < > 4* 6 6 6 18   CREATININE 0.40* 0.30*  < > 0.34* 0.29* 0.44* 0.36* 0.36*  CALCIUM 8.9 7.7*  < > 8.1* 8.2* 8.4 8.5 8.1*  MG  --  1.5  --  1.7  --   --   --   --   PHOS  --  1.5*  --   --   --   --   --   --   < > = values in this interval not displayed. Liver Function Tests:  Recent Labs Lab 02/23/13 1145 02/27/13 0505  AST 7 14  ALT 7 9  ALKPHOS 135* 81  BILITOT 0.4 0.2*  PROT 7.3 5.8*  ALBUMIN 3.6 2.5*   CBC:  Recent  Labs Lab 02/23/13 1145 02/27/13 0505  WBC 8.6 5.5  NEUTROABS 6.3  --   HGB 15.0 12.1  HCT 42.5 35.7*  MCV 88.4 89.5  PLT 145* 143*   CBG:  Recent Labs Lab 02/27/13 0754 02/27/13 1208 02/27/13 1719 02/27/13 2130 02/28/13 0810  GLUCAP 313* 331* 305* 238* 151*       Signed:  MACHAJ, VERONICA PA-S [facility] 02/28/2013, 9:58 AM   Addendum  Patient seen and examined, chart and data base reviewed.  I agree with the above assessment and plan.  For full details please see Mrs. Tama Gander, PA-S note.  Insulin changed to Lantus and Novolog.  Clint Lipps, MD [facility] Pager: [phone] 02/28/2013, 11:01 AM

## 2013-02-28 NOTE — Discharge Summary (Signed)
Physician Discharge Summary  Julie [name] ZOX:[id number] DOB: Sep 15, 1991 DOA: 02/23/2013  PCP: No primary provider on file.  Admit date: 02/23/2013 Discharge date: 02/28/2013  Time spent: 40 minutes  Recommendations for Outpatient Follow-up:  1. Followup with primary care physician within one week. 2. Followup with Gen. surgery, Dr. Magnus Ivan as needed.  Discharge Diagnoses:  Principal Problem:   DKA, type 1 Active Problems:   Hypokalemia   Pustular rash   Left leg weakness   Discharge Condition: Stable  Diet recommendation: Carbohydrate modified diet  Filed Weights   02/23/13 1108 02/23/13 1656  Weight: 55.339 kg (122 lb) 61.1 kg (134 lb 11.2 oz)    History of present illness:  Julie [name] is a 22 y.o. female with hx of DM1, off insulin for at least 1 month presented to the ED today with c/o skin rash over her back, buttocks - which is painful and associated with fever. Also reports nausea and inability to eat for a few days. She was found to be in dka and referred for admission.   Hospital Course:   1. DKA: Patient admitted to the hospital with DKA, started initially on insulin drip, she came in with bicarbonate of 11, her acidosis resolved, as well as ketosis. Patient was switched to subcutaneous insulin tolerating that very well. Patient was on 70/30 insulin mix, she said she was out of her medication she was not taking it. Initial blood sugar was 767. Patient did very well, and discharged in subcutaneous insulin.  2. IDDM: Patient diagnosed with diabetes when she was 22 year old, likely type 1 diabetes, she reported not being on any medication for about 2 and half years without complications or developing DKA. It's not uncommon to have what is known as honeymoon period patient did diagnosed with type 1 diabetes and they will not need any medication for some time. Anyway she is on Lantus insulin 40 units and NovoLog 15 units with meals. Anti-GAD  anti-Islets pending at the time of this discharge to differentiate between type I and type II diabetes mellitus. Anyway her diabetes not controlled, A1c 13.9 which correlate to a mean plasma glucose of 352.  3. Folliculitis/left sided gluteal abscess: Patient started on vancomycin on admission to the hospital, general surgery was consulted, 2 abscesses measures about 1 cm or drained at bedside. The abscesses were packed and patient instructed to have Sitz bath. Followup with Gen. surgery as needed.  4. Lower extremity weakness, this is likely secondary to diabetic neuropathy and hypokalemia. MRI of the lumbosacral spine was done and showed mild impingement of the S1 nerve roots, does not seem to be having radiculopathy. This is resolved prior to discharge patient ambulating well without assistance.  5. Probable diabetic neuropathy: Patient started on Neurontin 100 mg by mouth twice a day, for burning-like sensation in her feet.  Procedures:  Bedside incision and drainage of 2 left gluteal abscesses  Consultations:  General surgery, Dr. Magnus Ivan  Discharge Exam: Filed Vitals:   02/27/13 0523 02/27/13 1452 02/27/13 2044 02/28/13 0615  BP: 90/58 106/70 83/54 85/47   Pulse: 96 108 89 74  Temp: 99.7 F (37.6 C) 98.9 F (37.2 C) 99 F (37.2 C) 98.5 F (36.9 C)  TempSrc: Oral Oral Oral Oral  Resp: 16 18 16 16   Height:      Weight:      SpO2: 96% 94% 96% 95%   General: Alert and awake, oriented x3, not in any acute distress. HEENT: anicteric sclera, pupils reactive to light  and accommodation, EOMI CVS: S1-S2 clear, no murmur rubs or gallops Chest: clear to auscultation bilaterally, no wheezing, rales or rhonchi Abdomen: soft nontender, nondistended, normal bowel sounds, no organomegaly Extremities: no cyanosis, clubbing or edema noted bilaterally Neuro: Cranial nerves II-XII intact, no focal neurological deficits  Discharge Instructions  Discharge Orders   Future Appointments  Provider Department Dept Phone   03/07/2013 4:00 PM [facility] Covering Provider [facility] [phone]   Future Orders Complete By Expires     Diet Carb Modified  As directed     Increase activity slowly  As directed         Medication List    STOP taking these medications       insulin NPH-regular (70-30) 100 UNIT/ML injection  Commonly known as:  NOVOLIN 70/30     metFORMIN 500 MG tablet  Commonly known as:  GLUCOPHAGE      TAKE these medications       doxycycline 100 MG tablet  Commonly known as:  VIBRA-TABS  Take 1 tablet (100 mg total) by mouth every 12 (twelve) hours.     gabapentin 100 MG capsule  Commonly known as:  NEURONTIN  Take 1 capsule (100 mg total) by mouth 2 (two) times daily.     insulin aspart 100 unit/mL Soln FlexPen  Commonly known as:  NOVOLOG FLEXPEN  Inject 15 Units into the skin 3 (three) times daily with meals.     Insulin Glargine 100 UNIT/ML Sopn  Commonly known as:  LANTUS SOLOSTAR  Inject 40 Units into the skin at bedtime.       No Known Allergies     Follow-up Information   Follow up with [facility]     On 03/07/2013. (Appointment is for June 18th at 4pm- Please bring your photo ID, All mediacations you take, and make sure to have $3 co-pay )    Contact information:   [address] [place] [address]       Follow up with [facility], MD. Schedule an appointment as soon as possible for a visit in 2 weeks. (As needed,  Call if you have a problem.  Keep it clean and place iodoform into sites till it heals and you can not get the iodoform in the site.)    Contact information:   [address] [place] [address] [phone]        The results of significant diagnostics from this hospitalization (including imaging, microbiology, ancillary and laboratory) are listed below for reference.    Significant Diagnostic Studies: Mr Lumbar Spine W  Wo Contrast  02/26/2013   *RADIOLOGY REPORT*  Clinical Data: Left leg weakness.  Diabetes  MRI LUMBAR SPINE WITHOUT AND WITH CONTRAST  Technique:  Multiplanar and multiecho pulse sequences of the lumbar spine were obtained without and with intravenous contrast.  Contrast: 15mL MULTIHANCE GADOBENATE DIMEGLUMINE 529 MG/ML IV SOLN  Comparison: None.  Findings: Image quality degraded by mild to moderate motion.  Normal lumbar alignment.  Negative for fracture or mass lesion.  No acute bone marrow process.  Normal enhancement without evidence of tumor or infection in the lumbar spine.  Conus medullaris is normal and terminates at L1.  L1-2:  Negative  L2-3:  Negative  L3-4:  Negative  L4-5:  Small central disc protrusion without significant spinal stenosis or neural impingement.  L5-S1:  Moderately large left paracentral disc protrusion.  There is mild flattening of the  left S1 nerve root which could account for the patient's left leg weakness.  Right S1 nerve root does not show significant impingement.  IMPRESSION: Small central disc protrusion L4-5.  Moderate left paracentral disc protrusion L5-S1 with flattening of the left S1 nerve root.   Original Report Authenticated By: Janeece Riggers, M.D.    Microbiology: Recent Results (from the past 240 hour(s))  WOUND CULTURE     Status: None   Collection Time    02/23/13  2:10 PM      Result Value Range Status   Specimen Description LEG   Final   Special Requests Normal   Final   Gram Stain     Final   Value: NO WBC SEEN     NO SQUAMOUS EPITHELIAL CELLS SEEN     FEW GRAM POSITIVE COCCI IN PAIRS   Culture     Final   Value: MODERATE STAPHYLOCOCCUS AUREUS     Note: RIFAMPIN AND GENTAMICIN SHOULD NOT BE USED AS SINGLE DRUGS FOR TREATMENT OF STAPH INFECTIONS.   Report Status 02/26/2013 FINAL   Final   Organism ID, Bacteria STAPHYLOCOCCUS AUREUS   Final  MRSA PCR SCREENING     Status: None   Collection Time    02/23/13  5:16 PM      Result Value Range Status    MRSA by PCR NEGATIVE  NEGATIVE Final   Comment:            The GeneXpert MRSA Assay (FDA     approved for NASAL specimens     only), is one component of a     comprehensive MRSA colonization     surveillance program. It is not     intended to diagnose MRSA     infection nor to guide or     monitor treatment for     MRSA infections.  CULTURE, BLOOD (ROUTINE X 2)     Status: None   Collection Time    02/23/13  5:30 PM      Result Value Range Status   Specimen Description BLOOD LEFT ARM   Final   Special Requests BOTTLES DRAWN AEROBIC AND ANAEROBIC 10CC   Final   Culture  Setup Time 02/23/2013 23:29   Final   Culture     Final   Value:        BLOOD CULTURE RECEIVED NO GROWTH TO DATE CULTURE WILL BE HELD FOR 5 DAYS BEFORE ISSUING A FINAL NEGATIVE REPORT   Report Status PENDING   Incomplete  CULTURE, BLOOD (ROUTINE X 2)     Status: None   Collection Time    02/23/13  5:40 PM      Result Value Range Status   Specimen Description BLOOD LEFT ARM   Final   Special Requests BOTTLES DRAWN AEROBIC AND ANAEROBIC 10CC   Final   Culture  Setup Time 02/23/2013 23:29   Final   Culture     Final   Value:        BLOOD CULTURE RECEIVED NO GROWTH TO DATE CULTURE WILL BE HELD FOR 5 DAYS BEFORE ISSUING A FINAL NEGATIVE REPORT   Report Status PENDING   Incomplete     Labs: Basic Metabolic Panel:  Recent Labs Lab 02/23/13 1145 02/23/13 1739  02/24/13 1245 02/25/13 0340 02/25/13 1544 02/26/13 0605 02/27/13 0505  NA 127* 135  < > 132* 136 137 141 137  K 3.4* 2.5*  < > 2.5* 2.9* 3.5 3.5 4.1  CL 93* 105  < >  102 106 105 104 101  CO2 11* 15*  < > 19 24 24 30 30   GLUCOSE 767* 282*  < > 209* 225* 277* 199* 361*  BUN 10 6  < > 4* 6 6 6 18   CREATININE 0.40* 0.30*  < > 0.34* 0.29* 0.44* 0.36* 0.36*  CALCIUM 8.9 7.7*  < > 8.1* 8.2* 8.4 8.5 8.1*  MG  --  1.5  --  1.7  --   --   --   --   PHOS  --  1.5*  --   --   --   --   --   --   < > = values in this interval not displayed. Liver Function  Tests:  Recent Labs Lab 02/23/13 1145 02/27/13 0505  AST 7 14  ALT 7 9  ALKPHOS 135* 81  BILITOT 0.4 0.2*  PROT 7.3 5.8*  ALBUMIN 3.6 2.5*   No results found for this basename: LIPASE, AMYLASE,  in the last 168 hours No results found for this basename: AMMONIA,  in the last 168 hours CBC:  Recent Labs Lab 02/23/13 1145 02/27/13 0505  WBC 8.6 5.5  NEUTROABS 6.3  --   HGB 15.0 12.1  HCT 42.5 35.7*  MCV 88.4 89.5  PLT 145* 143*   Cardiac Enzymes: No results found for this basename: CKTOTAL, CKMB, CKMBINDEX, TROPONINI,  in the last 168 hours BNP: BNP (last 3 results) No results found for this basename: PROBNP,  in the last 8760 hours CBG:  Recent Labs Lab 02/27/13 0754 02/27/13 1208 02/27/13 1719 02/27/13 2130 02/28/13 0810  GLUCAP 313* 331* 305* 238* 151*       Signed:  Waunetta Riggle A  [facility] 02/28/2013, 11:06 AM

## 2013-02-28 NOTE — Progress Notes (Signed)
Nsg Discharge Note  Admit Date:  02/23/2013 Discharge date: 02/28/2013   Julie [name] to be D/C'd Home per MD order.  AVS completed.  Copy for chart, and copy for patient signed, and dated. Patient/caregiver able to verbalize understanding.  Discharge Medication:   Medication List    STOP taking these medications       insulin NPH-regular (70-30) 100 UNIT/ML injection  Commonly known as:  NOVOLIN 70/30     metFORMIN 500 MG tablet  Commonly known as:  GLUCOPHAGE      TAKE these medications       doxycycline 100 MG tablet  Commonly known as:  VIBRA-TABS  Take 1 tablet (100 mg total) by mouth every 12 (twelve) hours.     gabapentin 100 MG capsule  Commonly known as:  NEURONTIN  Take 1 capsule (100 mg total) by mouth 2 (two) times daily.     insulin aspart 100 unit/mL Soln FlexPen  Commonly known as:  NOVOLOG FLEXPEN  Inject 15 Units into the skin 3 (three) times daily with meals.     Insulin Glargine 100 UNIT/ML Sopn  Commonly known as:  LANTUS SOLOSTAR  Inject 40 Units into the skin at bedtime.     Insulin Pen Needle 31G X 5 MM Misc  One needle with every use        Discharge Assessment: Filed Vitals:   02/28/13 1500  BP: 112/68  Pulse: 78  Temp: 98.7 F (37.1 C)  Resp: 18   Skin clean, dry and intact without evidence of skin break down, no evidence of skin tears noted. IV catheter discontinued intact. Site without signs and symptoms of complications - no redness or edema noted at insertion site, patient denies c/o pain - only slight tenderness at site.  Dressing with slight pressure applied.  D/c Instructions-Education: Discharge instructions given to patient/family with verbalized understanding. D/c education completed with patient/family including follow up instructions, medication list, d/c activities limitations if indicated, with other d/c instructions as indicated by MD - patient able to verbalize understanding, all questions fully  answered. Patient instructed to return to ED, call 911, or call MD for any changes in condition.  Patient escorted via WC, and D/C home via private auto.  Julie Feutz Consuella Lose, RN 02/28/2013 6:28 PM

## 2013-02-28 NOTE — Progress Notes (Signed)
  Subjective:  She says the sites still hurts and is tearful with dressing.  Dressing and packing removed.  Medicine plans to send her home today.  She can shower and then replace a wick into sites.   Objective: Vital signs in last 24 hours: Temp:  [98.5 F (36.9 C)-99 F (37.2 C)] 98.5 F (36.9 C) (06/11 0615) Pulse Rate:  [74-108] 74 (06/11 0615) Resp:  [16-18] 16 (06/11 0615) BP: (83-106)/(47-70) 85/47 mmHg (06/11 0615) SpO2:  [94 %-96 %] 95 % (06/11 0615) Last BM Date: 02/24/13 Diet: Carb Mod Afebrile, VSS, No labs glucose 300's-151 this AM   Intake/Output from previous day:   Intake/Output this shift:    General appearance: alert, cooperative, no distress and tearful Skin: Sites on left and right buttocks clean, no bleeding.  no erythema.  Lab Results:   Recent Labs  02/27/13 0505  WBC 5.5  HGB 12.1  HCT 35.7*  PLT 143*    BMET  Recent Labs  02/26/13 0605 02/27/13 0505  NA 141 137  K 3.5 4.1  CL 104 101  CO2 30 30  GLUCOSE 199* 361*  BUN 6 18  CREATININE 0.36* 0.36*  CALCIUM 8.5 8.1*   PT/INR No results found for this basename: LABPROT, INR,  in the last 72 hours   Recent Labs Lab 02/23/13 1145 02/27/13 0505  AST 7 14  ALT 7 9  ALKPHOS 135* 81  BILITOT 0.4 0.2*  PROT 7.3 5.8*  ALBUMIN 3.6 2.5*     Lipase  No results found for this basename: lipase     Studies/Results: No results found.  Medications: . chlorhexidine   Topical Daily  . enoxaparin (LOVENOX) injection  40 mg Subcutaneous Daily  . gabapentin  100 mg Oral BID  . insulin aspart  0-20 Units Subcutaneous TID WC  . insulin aspart protamine- aspart  48 Units Subcutaneous Q supper  . insulin aspart protamine- aspart  54 Units Subcutaneous Q breakfast  . lidocaine-EPINEPHrine  20 mL Intradermal Once  . polymixin-bacitracin   Topical BID  . vancomycin  1,250 mg Intravenous Q8H    Assessment/Plan Papulopustular rash on buttock, post. thighs, and lower back @ high risk  for MRSA given DM  S/p i&d of site on left and right buttocks 02/27/13 DKA Diabetes mellitus (HBA1C 13.9) Probable diabetic neuropathy   Plan:  She can shower and family can replace wick into each site, she has iodoform 1/4 inch at bedside. She can follow up in office in 2-3 weeks if she needs to. I will put info in AVS.     LOS: 5 days    Homero Hyson 02/28/2013

## 2013-03-01 LAB — CULTURE, BLOOD (ROUTINE X 2): Culture: NO GROWTH

## 2013-03-07 ENCOUNTER — Ambulatory Visit: Payer: Medicaid Other

## 2013-04-01 ENCOUNTER — Inpatient Hospital Stay (HOSPITAL_BASED_OUTPATIENT_CLINIC_OR_DEPARTMENT_OTHER)
Admission: EM | Admit: 2013-04-01 | Discharge: 2013-04-05 | DRG: 638 | Disposition: A | Discharge status: Home / Self Care | Payer: Medicaid Other | Attending: Internal Medicine | Admitting: Internal Medicine

## 2013-04-01 ENCOUNTER — Encounter (HOSPITAL_BASED_OUTPATIENT_CLINIC_OR_DEPARTMENT_OTHER): Payer: Self-pay | Admitting: *Deleted

## 2013-04-01 DIAGNOSIS — E1310 Other specified diabetes mellitus with ketoacidosis without coma: Secondary | ICD-10-CM

## 2013-04-01 DIAGNOSIS — E875 Hyperkalemia: Secondary | ICD-10-CM | POA: Diagnosis present

## 2013-04-01 DIAGNOSIS — Z9119 Patient's noncompliance with other medical treatment and regimen: Secondary | ICD-10-CM

## 2013-04-01 DIAGNOSIS — R632 Polyphagia: Secondary | ICD-10-CM | POA: Diagnosis present

## 2013-04-01 DIAGNOSIS — Z833 Family history of diabetes mellitus: Secondary | ICD-10-CM

## 2013-04-01 DIAGNOSIS — E1110 Type 2 diabetes mellitus with ketoacidosis without coma: Secondary | ICD-10-CM

## 2013-04-01 DIAGNOSIS — R109 Unspecified abdominal pain: Secondary | ICD-10-CM

## 2013-04-01 DIAGNOSIS — Z794 Long term (current) use of insulin: Secondary | ICD-10-CM

## 2013-04-01 DIAGNOSIS — F32A Depression, unspecified: Secondary | ICD-10-CM

## 2013-04-01 DIAGNOSIS — R29898 Other symptoms and signs involving the musculoskeletal system: Secondary | ICD-10-CM

## 2013-04-01 DIAGNOSIS — R10A1 Flank pain, right side: Secondary | ICD-10-CM

## 2013-04-01 DIAGNOSIS — Z9114 Patient's other noncompliance with medication regimen: Secondary | ICD-10-CM

## 2013-04-01 DIAGNOSIS — I959 Hypotension, unspecified: Secondary | ICD-10-CM

## 2013-04-01 DIAGNOSIS — Z91199 Patient's noncompliance with other medical treatment and regimen due to unspecified reason: Secondary | ICD-10-CM

## 2013-04-01 DIAGNOSIS — K0510 Chronic gingivitis, plaque induced: Secondary | ICD-10-CM

## 2013-04-01 DIAGNOSIS — L080 Pyoderma: Secondary | ICD-10-CM

## 2013-04-01 DIAGNOSIS — E876 Hypokalemia: Secondary | ICD-10-CM

## 2013-04-01 DIAGNOSIS — L0231 Cutaneous abscess of buttock: Secondary | ICD-10-CM | POA: Diagnosis present

## 2013-04-01 DIAGNOSIS — E119 Type 2 diabetes mellitus without complications: Secondary | ICD-10-CM

## 2013-04-01 DIAGNOSIS — Z91148 Patient's other noncompliance with medication regimen for other reason: Secondary | ICD-10-CM

## 2013-04-01 DIAGNOSIS — E860 Dehydration: Secondary | ICD-10-CM

## 2013-04-01 DIAGNOSIS — E1010 Type 1 diabetes mellitus with ketoacidosis without coma: Secondary | ICD-10-CM

## 2013-04-01 DIAGNOSIS — J029 Acute pharyngitis, unspecified: Secondary | ICD-10-CM | POA: Diagnosis present

## 2013-04-01 DIAGNOSIS — F329 Major depressive disorder, single episode, unspecified: Secondary | ICD-10-CM | POA: Diagnosis present

## 2013-04-01 LAB — URINALYSIS, ROUTINE W REFLEX MICROSCOPIC
Ketones, ur: >80 mg/dL — AB
Leukocytes, UA: NEGATIVE
Nitrite: NEGATIVE
Protein, ur: NEGATIVE mg/dL
Urobilinogen, UA: 0.2 mg/dL (ref 0.0–1.0)

## 2013-04-01 LAB — CBC WITH DIFFERENTIAL/PLATELET
Basophils Absolute: 0 10*3/uL (ref 0.0–0.1)
Basophils Relative: 0 % (ref 0–1)
Eosinophils Relative: 1 % (ref 0–5)
Lymphocytes Relative: 20 % (ref 12–46)
MCHC: 34.5 g/dL (ref 30.0–36.0)
Monocytes Absolute: 0.5 10*3/uL (ref 0.1–1.0)
Neutro Abs: 6.2 10*3/uL (ref 1.7–7.7)
Platelets: 208 10*3/uL (ref 150–400)
RDW: 12.4 % (ref 11.5–15.5)
WBC: 8.5 10*3/uL (ref 4.0–10.5)

## 2013-04-01 LAB — GLUCOSE, CAPILLARY
Glucose-Capillary: 192 mg/dL — ABNORMAL HIGH (ref 70–99)
Glucose-Capillary: 216 mg/dL — ABNORMAL HIGH (ref 70–99)

## 2013-04-01 LAB — PREGNANCY, URINE: Preg Test, Ur: NEGATIVE

## 2013-04-01 LAB — CBC
MCV: 87.1 fL (ref 78.0–100.0)
Platelets: 212 10*3/uL (ref 150–400)
RDW: 12.8 % (ref 11.5–15.5)
WBC: 7.5 10*3/uL (ref 4.0–10.5)

## 2013-04-01 LAB — URINE MICROSCOPIC-ADD ON

## 2013-04-01 LAB — BASIC METABOLIC PANEL
BUN: 14 mg/dL (ref 6–23)
Calcium: 8.1 mg/dL — ABNORMAL LOW (ref 8.4–10.5)
Creatinine, Ser: 0.4 mg/dL — ABNORMAL LOW (ref 0.50–1.10)
GFR calc Af Amer: >90 mL/min (ref >90)

## 2013-04-01 LAB — KETONES, QUALITATIVE: Acetone, Bld: POSITIVE — AB

## 2013-04-01 MED ORDER — MORPHINE SULFATE 4 MG/ML IJ SOLN
INTRAMUSCULAR | Status: AC
  Administered 2013-04-01: 4 mg via INTRAVENOUS
  Filled 2013-04-01: qty 1

## 2013-04-01 MED ORDER — SODIUM CHLORIDE 0.9 % IV SOLN
1000.0000 mL | Freq: Once | INTRAVENOUS | Status: AC
  Administered 2013-04-01: 1000 mL via INTRAVENOUS

## 2013-04-01 MED ORDER — GABAPENTIN 100 MG PO CAPS
100.0000 mg | ORAL_CAPSULE | Freq: Two times a day (BID) | ORAL | Status: DC
  Administered 2013-04-01 – 2013-04-05 (×8): 100 mg via ORAL
  Filled 2013-04-01 (×10): qty 1

## 2013-04-01 MED ORDER — SODIUM CHLORIDE 0.9 % IV SOLN: INTRAVENOUS | Status: DC

## 2013-04-01 MED ORDER — SODIUM CHLORIDE 0.9 % IV SOLN
1000.0000 mL | INTRAVENOUS | Status: DC
  Administered 2013-04-01: 1000 mL via INTRAVENOUS

## 2013-04-01 MED ORDER — INSULIN REGULAR HUMAN 100 UNIT/ML IJ SOLN
INTRAMUSCULAR | Status: AC
  Administered 2013-04-01: 3.6 [IU] via INTRAVENOUS
  Filled 2013-04-01: qty 1

## 2013-04-01 MED ORDER — ONDANSETRON HCL 4 MG/2ML IJ SOLN
INTRAMUSCULAR | Status: AC
  Administered 2013-04-01: 4 mg via INTRAVENOUS
  Filled 2013-04-01: qty 2

## 2013-04-01 MED ORDER — ONDANSETRON HCL 4 MG/2ML IJ SOLN: 4.0000 mg | Freq: Once | INTRAMUSCULAR | Status: AC

## 2013-04-01 MED ORDER — SODIUM CHLORIDE 0.9 % IV SOLN
INTRAVENOUS | Status: DC
  Administered 2013-04-02: 1000 mL via INTRAVENOUS

## 2013-04-01 MED ORDER — DEXTROSE-NACL 5-0.45 % IV SOLN
INTRAVENOUS | Status: DC
  Administered 2013-04-01: 1000 mL via INTRAVENOUS

## 2013-04-01 MED ORDER — DEXTROSE 5 % IV SOLN
1.0000 g | Freq: Once | INTRAVENOUS | Status: AC
  Administered 2013-04-02: 1 g via INTRAVENOUS
  Filled 2013-04-01: qty 10

## 2013-04-01 MED ORDER — ONDANSETRON HCL 4 MG/2ML IJ SOLN
4.0000 mg | Freq: Three times a day (TID) | INTRAMUSCULAR | Status: DC | PRN
  Administered 2013-04-01: 4 mg via INTRAVENOUS
  Filled 2013-04-01: qty 2

## 2013-04-01 MED ORDER — MORPHINE SULFATE 4 MG/ML IJ SOLN: 4.0000 mg | Freq: Once | INTRAMUSCULAR | Status: AC

## 2013-04-01 MED ORDER — POTASSIUM CHLORIDE 10 MEQ/100ML IV SOLN
10.0000 meq | INTRAVENOUS | Status: AC
  Administered 2013-04-01 (×2): 10 meq via INTRAVENOUS
  Filled 2013-04-01: qty 200

## 2013-04-01 MED ORDER — SODIUM CHLORIDE 0.9 % IV BOLUS (SEPSIS)
1000.0000 mL | Freq: Once | INTRAVENOUS | Status: AC
  Administered 2013-04-01: 1000 mL via INTRAVENOUS

## 2013-04-01 MED ORDER — KETOROLAC TROMETHAMINE 30 MG/ML IJ SOLN
30.0000 mg | Freq: Three times a day (TID) | INTRAMUSCULAR | Status: DC | PRN
  Administered 2013-04-01 – 2013-04-05 (×5): 30 mg via INTRAVENOUS
  Filled 2013-04-01 (×5): qty 1

## 2013-04-01 MED ORDER — ENOXAPARIN SODIUM 40 MG/0.4ML ~~LOC~~ SOLN
40.0000 mg | SUBCUTANEOUS | Status: DC
  Administered 2013-04-01 – 2013-04-04 (×4): 40 mg via SUBCUTANEOUS
  Filled 2013-04-01 (×6): qty 0.4

## 2013-04-01 MED ORDER — SODIUM CHLORIDE 0.9 % IV SOLN
INTRAVENOUS | Status: DC
  Filled 2013-04-01: qty 1

## 2013-04-01 MED ORDER — MORPHINE SULFATE 4 MG/ML IJ SOLN
4.0000 mg | Freq: Once | INTRAMUSCULAR | Status: AC
  Administered 2013-04-01: 4 mg via INTRAVENOUS
  Filled 2013-04-01: qty 1

## 2013-04-01 MED ORDER — DEXTROSE 50 % IV SOLN: 25.0000 mL | INTRAVENOUS | Status: DC | PRN

## 2013-04-01 NOTE — ED Notes (Signed)
Insulin rate changed to 2.1 per glucometer based upon cbg 273

## 2013-04-01 NOTE — Progress Notes (Addendum)
Discussed case with Dr. Manus Gunning from [facility]. Pt shopping today, started noticing sharp flank pain, then presented to the ED. Found to have bs over 400 w/ GAP of mid 20's. Has recent admit for DKA. Again not complaint w/ insulin. Accepted to step-down for IVF and insulin gtt. Unable to urinate so still pending UTI w/u.

## 2013-04-01 NOTE — ED Provider Notes (Signed)
History  This chart was scribed for Glynn Octave, MD, by Candelaria Stagers, ED Scribe. This patient was seen in room MH10/MH10 and the patient's care was started at 4:24 PM  CSN: [id number] Arrival date & time 04/01/13  1620  First MD Initiated Contact with Patient 04/01/13 1621     Chief Complaint  Patient presents with  . Back Pain  . Hyperglycemia    The history is provided by the patient. No language interpreter was used.   HPI Comments: Julie [name] is a 22 y.o. female who presents to the Emergency Department complaining of constant right lower back pain that started about two hours ago and has gradually worsened.  Pt is also experiencing nausea, vomiting, and hematuria.  She reports two episodes of vomiting today.  She denies abdominal pain or dysuria.  Pt has h/o diabetes and reports today her sugar levels have been elevated.  She denies recent injury or trauma.  Nothing seems to make the sx better or worse.  She has taken Advil with no relief.  She denies ill contacts.  Pt was hospitalized last month for DKA and reports sx do not feel similar.        Past Medical History  Diagnosis Date  . Diabetes mellitus without complication    Past Surgical History  Procedure Laterality Date  . Cesarean section    . Cesarean section  2012   Family History  Problem Relation Age of Onset  . Diabetes Mother    History  Substance Use Topics  . Smoking status: Never Smoker   . Smokeless tobacco: Not on file  . Alcohol Use: No   OB History   Grav Para Term Preterm Abortions TAB SAB Ect Mult Living                 Review of Systems  All other systems reviewed and are negative.    Allergies  Review of patient's allergies indicates no known allergies.  Home Medications   Current Outpatient Rx  Name  Route  Sig  Dispense  Refill  . doxycycline (VIBRA-TABS) 100 MG tablet   Oral   Take 1 tablet (100 mg total) by mouth every 12 (twelve) hours.   20 tablet    0   . gabapentin (NEURONTIN) 100 MG capsule   Oral   Take 1 capsule (100 mg total) by mouth 2 (two) times daily.   60 capsule   0   . insulin aspart (NOVOLOG FLEXPEN) 100 unit/mL SOLN FlexPen   Subcutaneous   Inject 15 Units into the skin 3 (three) times daily with meals.   5 pen   1   . Insulin Glargine (LANTUS SOLOSTAR) 100 UNIT/ML SOPN   Subcutaneous   Inject 40 Units into the skin at bedtime.   5 pen   1   . Insulin Pen Needle 31G X 5 MM MISC      One needle with every use   100 each   11    BP 107/61  Pulse 96  Temp(Src) 99.3 F (37.4 C) (Oral)  Resp 22  Ht 5\' 10"  (1.778 m)  Wt 140 lb (63.504 kg)  BMI 20.09 kg/m2  SpO2 98% Physical Exam  Nursing note and vitals reviewed. Constitutional: She is oriented to person, place, and time. She appears well-developed and well-nourished.  Tearful, anxious, uncomfortable.   HENT:  Head: Normocephalic and atraumatic.  Dry mucous membranes.   Eyes: EOM are normal.  Neck: Neck supple. No  tracheal deviation present.  Cardiovascular: Normal rate.   Pulmonary/Chest: Effort normal. No respiratory distress.  Abdominal: Soft. There is no tenderness.  Musculoskeletal: Normal range of motion.  Right CVA tenderness.  5/5 strength in bilateral lower extremities. Ankle plantar and dorsiflexion intact. Great toe extension intact bilaterally. +2 DP and PT pulses. +2 patellar reflexes bilaterally. Normal gait.   Neurological: She is alert and oriented to person, place, and time.  Skin: Skin is warm and dry.  Psychiatric: She has a normal mood and affect. Her behavior is normal.    ED Course  Procedures   DIAGNOSTIC STUDIES: Oxygen Saturation is 98% on room air, normal by my interpretation.    COORDINATION OF CARE:  4:26 PM Discussed course of care with pt which includes pain medication and blood work.  Pt understands and agrees.   5:25 PM Discussed lab work with pt and need for hospitalization due to DKA.   Labs Reviewed   KETONES, QUALITATIVE - Abnormal; Notable for the following:    Acetone, Bld POSITIVE (*)    All other components within normal limits  BASIC METABOLIC PANEL - Abnormal; Notable for the following:    Sodium 133 (*)    Potassium 3.3 (*)    CO2 11 (*)    Glucose, Bld 418 (*)    Creatinine, Ser 0.40 (*)    Calcium 8.1 (*)    All other components within normal limits  CBC WITH DIFFERENTIAL - Abnormal; Notable for the following:    HCT 35.7 (*)    All other components within normal limits  GLUCOSE, CAPILLARY - Abnormal; Notable for the following:    Glucose-Capillary 273 (*)    All other components within normal limits  PREGNANCY, URINE  URINALYSIS, ROUTINE W REFLEX MICROSCOPIC   No results found. 1. DKA (diabetic ketoacidoses)     MDM  Diabetic with acute onset of right flank pain, nausea and vomiting today. Sugar is in the 400 range. Denies urinary or vaginal symptoms.  With hyperglycemia, abdominal pain nausea or vomiting. There is concern for DKA.  Patient has large ketones with a gap of 26. She started IV insulin and IV fluids. Her flank pain has resolved with pain medication. UA is negative for hematuria or infection. D/w Dr. Rhona Leavens who will admit to stepdown at [facility].  CRITICAL CARE Performed by: Glynn Octave Total critical care time: 30 Critical care time was exclusive of separately billable procedures and treating other patients. Critical care was necessary to treat or prevent imminent or life-threatening deterioration. Critical care was time spent personally by me on the following activities: development of treatment plan with patient and/or surrogate as well as nursing, discussions with consultants, evaluation of patient's response to treatment, examination of patient, obtaining history from patient or surrogate, ordering and performing treatments and interventions, ordering and review of laboratory studies, ordering and review of radiographic studies, pulse oximetry and  re-evaluation of patient's condition.  I personally performed the services described in this documentation, which was scribed in my presence. The recorded information has been reviewed and is accurate.    Glynn Octave, MD 04/02/13 [phone]

## 2013-04-01 NOTE — ED Notes (Signed)
Called floor for room 3316 & RN will return call for report

## 2013-04-01 NOTE — ED Notes (Signed)
CBG 273.  Shary Key Notified.

## 2013-04-01 NOTE — ED Notes (Signed)
Back pain n/v no injury cbg 416

## 2013-04-01 NOTE — H&P (Signed)
Chief Complaint:  Right flank pain  HPI: 22 yo female type 1 dm with h/o noncompliance with insulin in past was out shopping today when she started having right flank pain with no radiation.  No fevers.  No n/v.  No radiation.  No dysuria.  Some blood noted in urine.  Glucose has been high.  Went to [facility] for evaluation and found to be in dka, transferred to [facility] here at [facility].  She is still c/o rt flank pain which was relieved earlier with morphine 8mg  iv.  Pt reports she has been compliant with her insulin.  Her last hga1c in June was over 13%.  Denies any rashes.  Review of Systems:  Positive and negative as per HPI otherwise all other systems are negative  Past Medical History: Past Medical History  Diagnosis Date  . Diabetes mellitus without complication    Past Surgical History  Procedure Laterality Date  . Cesarean section    . Cesarean section  2012    Medications: Prior to Admission medications   Medication Sig Start Date End Date Taking? Authorizing Provider  doxycycline (VIBRA-TABS) 100 MG tablet Take 1 tablet (100 mg total) by mouth every 12 (twelve) hours. 02/28/13   Clydia Llano, MD  gabapentin (NEURONTIN) 100 MG capsule Take 1 capsule (100 mg total) by mouth 2 (two) times daily. 02/28/13   Clydia Llano, MD  insulin aspart (NOVOLOG FLEXPEN) 100 unit/mL SOLN FlexPen Inject 15 Units into the skin 3 (three) times daily with meals. 02/28/13   Clydia Llano, MD  Insulin Glargine (LANTUS SOLOSTAR) 100 UNIT/ML SOPN Inject 40 Units into the skin at bedtime. 02/28/13   Clydia Llano, MD  Insulin Pen Needle 31G X 5 MM MISC One needle with every use 02/28/13   Clydia Llano, MD    Allergies:  No Known Allergies  Social History:  reports that she has never smoked. She does not have any smokeless tobacco history on file. She reports that she does not drink alcohol or use illicit drugs.  Family History: Family History  Problem Relation Age of Onset  .  Diabetes Mother     Physical Exam: Filed Vitals:   04/01/13 1621 04/01/13 1800 04/01/13 1957  BP: 107/61 102/50 106/60  Pulse: 96 77 95  Temp: 99.3 F (37.4 C)  98.4 F (36.9 C)  TempSrc: Oral    Resp: 22 19 18   Height: 5\' 10"  (1.778 m)    Weight: 63.504 kg (140 lb)    SpO2: 98% 98% 100%   General appearance: alert, cooperative and no distress Head: Normocephalic, without obvious abnormality, atraumatic Eyes: negative Nose: Nares normal. Septum midline. Mucosa normal. No drainage or sinus tenderness. Neck: no JVD and supple, symmetrical, trachea midline Lungs: clear to auscultation bilaterally Heart: regular rate and rhythm, S1, S2 normal, no murmur, click, rub or gallop Abdomen: soft, non-tender; bowel sounds normal; no masses,  no organomegaly rt cva ttp Extremities: extremities normal, atraumatic, no cyanosis or edema Pulses: 2+ and symmetric Skin: Skin color, texture, turgor normal. No rashes or lesions Neurologic: Grossly normal    Labs on Admission:   Recent Labs  04/01/13 1629  NA 133*  K 3.3*  CL 96  CO2 11*  GLUCOSE 418*  BUN 14  CREATININE 0.40*  CALCIUM 8.1*    Recent Labs  04/01/13 1629  WBC 8.5  NEUTROABS 6.2  HGB 12.3  HCT 35.7*  MCV 89.0  PLT 208    Radiological Exams on Admission: No  results found.  Assessment/Plan  22 yo female with dka and possible kidney stone?? Principal Problem:   DKA, type 1 Active Problems:   Hypotension   Rt flank pain   Diabetes   H/O medication noncompliance  Place on dka pathway, reck bmp stat along with lfts/lipase.  Give one dose of rocephin, if pain persists consider further imaging of urinary system in am.  ua does not appear infected and is neg for blood.  Wbc normal with no fever is also reassuring.  bp is soft, will bolus a liter of ivf.  Give dose of toradol.  Ck uds.  Reassess ivf after results of bmp and serum acetone levels back.  Stepdown.  Full code.  Acey Woodfield A 04/01/2013, 9:53  PM

## 2013-04-01 NOTE — ED Notes (Signed)
Insulin drip increased to 3.1 per glucometer glucose 215

## 2013-04-02 ENCOUNTER — Encounter (HOSPITAL_COMMUNITY): Payer: Self-pay | Admitting: *Deleted

## 2013-04-02 DIAGNOSIS — E860 Dehydration: Secondary | ICD-10-CM | POA: Diagnosis present

## 2013-04-02 LAB — GLUCOSE, CAPILLARY
Glucose-Capillary: 120 mg/dL — ABNORMAL HIGH (ref 70–99)
Glucose-Capillary: 143 mg/dL — ABNORMAL HIGH (ref 70–99)
Glucose-Capillary: 147 mg/dL — ABNORMAL HIGH (ref 70–99)
Glucose-Capillary: 152 mg/dL — ABNORMAL HIGH (ref 70–99)
Glucose-Capillary: 161 mg/dL — ABNORMAL HIGH (ref 70–99)
Glucose-Capillary: 185 mg/dL — ABNORMAL HIGH (ref 70–99)
Glucose-Capillary: 194 mg/dL — ABNORMAL HIGH (ref 70–99)
Glucose-Capillary: 215 mg/dL — ABNORMAL HIGH (ref 70–99)
Glucose-Capillary: 362 mg/dL — ABNORMAL HIGH (ref 70–99)
Glucose-Capillary: 85 mg/dL (ref 70–99)

## 2013-04-02 LAB — CBC WITH DIFFERENTIAL/PLATELET
Basophils Absolute: 0 10*3/uL (ref 0.0–0.1)
Basophils Relative: 0 % (ref 0–1)
Hemoglobin: 11.6 g/dL — ABNORMAL LOW (ref 12.0–15.0)
MCHC: 35 g/dL (ref 30.0–36.0)
Monocytes Relative: 11 % (ref 3–12)
Neutro Abs: 2.3 10*3/uL (ref 1.7–7.7)
Neutrophils Relative %: 39 % — ABNORMAL LOW (ref 43–77)
Platelets: 177 10*3/uL (ref 150–400)
RDW: 13 % (ref 11.5–15.5)

## 2013-04-02 LAB — BASIC METABOLIC PANEL
BUN: 10 mg/dL (ref 6–23)
BUN: 10 mg/dL (ref 6–23)
CO2: 17 mEq/L — ABNORMAL LOW (ref 19–32)
CO2: 16 mEq/L — ABNORMAL LOW (ref 19–32)
CO2: 18 mEq/L — ABNORMAL LOW (ref 19–32)
Calcium: 7.2 mg/dL — ABNORMAL LOW (ref 8.4–10.5)
Calcium: 7.2 mg/dL — ABNORMAL LOW (ref 8.4–10.5)
Calcium: 7.5 mg/dL — ABNORMAL LOW (ref 8.4–10.5)
Chloride: 107 mEq/L (ref 96–112)
Chloride: 103 mEq/L (ref 96–112)
Creatinine, Ser: 0.31 mg/dL — ABNORMAL LOW (ref 0.50–1.10)
Creatinine, Ser: 0.29 mg/dL — ABNORMAL LOW (ref 0.50–1.10)
Creatinine, Ser: 0.3 mg/dL — ABNORMAL LOW (ref 0.50–1.10)
Creatinine, Ser: 0.32 mg/dL — ABNORMAL LOW (ref 0.50–1.10)
GFR calc Af Amer: >90 mL/min (ref >90)
GFR calc Af Amer: >90 mL/min (ref >90)
GFR calc non Af Amer: >90 mL/min (ref >90)
Glucose, Bld: 149 mg/dL — ABNORMAL HIGH (ref 70–99)
Glucose, Bld: 215 mg/dL — ABNORMAL HIGH (ref 70–99)
Sodium: 137 mEq/L (ref 135–145)

## 2013-04-02 LAB — POCT I-STAT 3, VENOUS BLOOD GAS (G3P V)
pCO2, Ven: 27.4 mmHg — ABNORMAL LOW (ref 45.0–50.0)
pO2, Ven: 90 mmHg — ABNORMAL HIGH (ref 30.0–45.0)

## 2013-04-02 LAB — HEPATIC FUNCTION PANEL
ALT: 7 U/L (ref 0–35)
AST: 10 U/L (ref 0–37)
Alkaline Phosphatase: 80 U/L (ref 39–117)
Total Protein: 5.6 g/dL — ABNORMAL LOW (ref 6.0–8.3)

## 2013-04-02 MED ORDER — INSULIN GLARGINE 100 UNIT/ML ~~LOC~~ SOLN
45.0000 [IU] | SUBCUTANEOUS | Status: DC
  Administered 2013-04-02: 45 [IU] via SUBCUTANEOUS
  Filled 2013-04-02: qty 0.45

## 2013-04-02 MED ORDER — POTASSIUM CHLORIDE CRYS ER 20 MEQ PO TBCR: 40.0000 meq | EXTENDED_RELEASE_TABLET | Freq: Once | ORAL | Status: DC

## 2013-04-02 MED ORDER — INSULIN GLARGINE 100 UNIT/ML ~~LOC~~ SOLN
45.0000 [IU] | Freq: Every day | SUBCUTANEOUS | Status: DC
  Filled 2013-04-02: qty 0.45

## 2013-04-02 MED ORDER — INSULIN ASPART 100 UNIT/ML ~~LOC~~ SOLN
5.0000 [IU] | Freq: Three times a day (TID) | SUBCUTANEOUS | Status: DC
  Administered 2013-04-03 (×3): 5 [IU] via SUBCUTANEOUS

## 2013-04-02 MED ORDER — INSULIN ASPART 100 UNIT/ML ~~LOC~~ SOLN
0.0000 [IU] | Freq: Three times a day (TID) | SUBCUTANEOUS | Status: DC
  Administered 2013-04-02: 3 [IU] via SUBCUTANEOUS
  Administered 2013-04-03 (×2): 8 [IU] via SUBCUTANEOUS
  Administered 2013-04-03: 5 [IU] via SUBCUTANEOUS
  Administered 2013-04-04: 2 [IU] via SUBCUTANEOUS
  Administered 2013-04-04: 15 [IU] via SUBCUTANEOUS
  Administered 2013-04-04: 8 [IU] via SUBCUTANEOUS
  Administered 2013-04-05: 5 [IU] via SUBCUTANEOUS
  Administered 2013-04-05: 15 [IU] via SUBCUTANEOUS

## 2013-04-02 MED ORDER — BISACODYL 5 MG PO TBEC
10.0000 mg | DELAYED_RELEASE_TABLET | Freq: Every day | ORAL | Status: DC | PRN
  Administered 2013-04-02 – 2013-04-03 (×2): 10 mg via ORAL
  Filled 2013-04-02 (×2): qty 2

## 2013-04-02 MED ORDER — SENNA 8.6 MG PO TABS
1.0000 | ORAL_TABLET | Freq: Every day | ORAL | Status: DC
  Administered 2013-04-02 – 2013-04-04 (×3): 8.6 mg via ORAL
  Filled 2013-04-02 (×5): qty 1

## 2013-04-02 MED ORDER — INSULIN ASPART 100 UNIT/ML ~~LOC~~ SOLN
0.0000 [IU] | Freq: Every day | SUBCUTANEOUS | Status: DC
  Administered 2013-04-02 – 2013-04-03 (×2): 4 [IU] via SUBCUTANEOUS
  Administered 2013-04-04: 2 [IU] via SUBCUTANEOUS

## 2013-04-02 MED ORDER — SODIUM CHLORIDE 0.9 % IV SOLN
INTRAVENOUS | Status: DC
  Administered 2013-04-03: 11:00:00 via INTRAVENOUS

## 2013-04-02 MED ORDER — ACETAMINOPHEN 325 MG PO TABS
650.0000 mg | ORAL_TABLET | Freq: Four times a day (QID) | ORAL | Status: DC | PRN
Start: 2013-04-02 — End: 2013-04-05
  Administered 2013-04-03 – 2013-04-05 (×3): 650 mg via ORAL
  Filled 2013-04-02 (×3): qty 2

## 2013-04-02 NOTE — Progress Notes (Addendum)
Lantus admin, ins gtt d/c per Dr order. Pt to TX to 6N-04, VSS, will call report

## 2013-04-02 NOTE — Progress Notes (Signed)
[facility] Progress Note [place] TEAM 1 - Stepdown/ICU TEAM   Julie [name] ZOX:[id number] DOB: 03/30/1991 DOA: 04/01/2013 PCP: No primary provider on file.  Brief narrative: 22 year old female patient with type 1 diabetes. Just discharged from this facility 02/23/2013 after treatment for DKA. Hemoglobin A1c at that time was 13. During that hospitalization she also had gluteal abscesses and was evaluated by general surgery. She had also been admitted in February of 2014 at which time she was newly diagnosed with diabetes. During the last admission it was noted she had never followed up with a primary care provider even though she had been given several resources to find a primary care provider. She also endorsed during the last admission she had stopped taking her insulin one month prior because she could not afford it (she was on 70/30 insulin which apparently was $80 at [facility]). The diabetes educator during the last admission told the patient that she can get the 70/30 insulin at [facility] for $25. Apparently the patient still has fears about giving herself injections. She was given extensive education during last hospitalization. It was also noted that the patient's Medicaid was active and that the patient could receive most of her prescriptions for $3. She was scheduled to followup with Dr. Standley Dakins at the [facility] health and wellness clinic. She presented back to the emergency department with complaints of flank pain. She was found to be in mild DKA with an anion gap of 25 and a CBG just under 500. She was started on an insulin infusion.  Assessment/Plan:    DKA, type 1/ Diabetes -AG=12/CO2 18 at this time  -last dc was on 70/30 insulin but at admit was on Lantus so will resume for now - safe to transition off insulin gtt - will need to monitor CBG when off gtt    Hypotension/  Dehydration -cont IVF - due to DKA and polyuria     Rt flank pain -UA  negative -no pain this am -due to DKA itself    Hypokalemia -oral replete and follow     H/O medication noncompliance -Newly dx'd diabetic in Feb 2014 but has been inconsistent in adhering to tx plan -have asked Diabetes Educator to see again   DVT prophylaxis: Lovenox Code Status: FULL Family Communication: Patient Disposition Plan: Transfer to floor  Consultants: none  Procedures: None  Antibiotics: None  HPI/Subjective: Patient alert. Denied issues with adherence or difficulty obtaining medications prior to admission. No other complaints verbalized.   Objective: Blood pressure 91/58, pulse 70, temperature 98.3 F (36.8 C), temperature source Oral, resp. rate 17, height 5\' 10"  (1.778 m), weight 64.9 kg (143 lb 1.3 oz), SpO2 95.00%.  Intake/Output Summary (Last 24 hours) at 04/02/13 1449 Last data filed at 04/02/13 8119  Gross per 24 hour  Intake 2233.2 ml  Output      0 ml  Net 2233.2 ml     Exam: General: No acute respiratory distress Lungs: Clear to auscultation bilaterally without wheezes or crackles, RA Cardiovascular: Regular rate and rhythm without murmur gallop or rub normal S1 and S2, no peripheral edema or JVD Abdomen: Nontender, nondistended, soft, bowel sounds positive, no rebound, no ascites, no appreciable mass Musculoskeletal: No significant cyanosis, clubbing of bilateral lower extremities Neurological: Alert and oriented x 3, moves all extremities x 4 without focal neurological deficits, CN 2-12 intact  Scheduled Meds: Scheduled Meds: . enoxaparin (LOVENOX) injection  40 mg Subcutaneous Q24H  . gabapentin  100 mg Oral BID  Data Reviewed: Basic Metabolic Panel:  Recent Labs Lab 04/01/13 1629 04/01/13 2248 04/02/13 0200 04/02/13 0516 04/02/13 1148  NA 133* 137 136 134* 133*  K 3.3* 2.8* 3.3* 2.8* 3.0*  CL 96 107 106 108 103  CO2 11* 17* 15* 16* 18*  GLUCOSE 418* 149* 169* 147* 215*  BUN 14 11 10 10 10   CREATININE 0.40* 0.31*  0.29* 0.30* 0.32*  CALCIUM 8.1* 7.2* 7.0* 7.2* 7.5*   Liver Function Tests:  Recent Labs Lab 04/01/13 2248  AST 10  ALT 7  ALKPHOS 80  BILITOT 0.2*  PROT 5.6*  ALBUMIN 2.6*    Recent Labs Lab 04/01/13 2248  LIPASE 21   CBC:  Recent Labs Lab 04/01/13 1629 04/01/13 2248 04/02/13 0516  WBC 8.5 7.5 5.9  NEUTROABS 6.2  --  2.3  HGB 12.3 12.1 11.6*  HCT 35.7* 34.3* 33.1*  MCV 89.0 87.1 87.3  PLT 208 212 177   CBG:  Recent Labs Lab 04/02/13 0341 04/02/13 0448 04/02/13 0554 04/02/13 0646 04/02/13 0751  GLUCAP 185* 147* 362* 88 85    Recent Results (from the past 240 hour(s))  MRSA PCR SCREENING     Status: None   Collection Time    04/02/13  1:58 AM      Result Value Range Status   MRSA by PCR NEGATIVE  NEGATIVE Final   Comment:            The GeneXpert MRSA Assay (FDA     approved for NASAL specimens     only), is one component of a     comprehensive MRSA colonization     surveillance program. It is not     intended to diagnose MRSA     infection nor to guide or     monitor treatment for     MRSA infections.     Studies:  Recent x-ray studies have been reviewed in detail by the Attending Physician    Junious Silk, ANP [facility] Office  [phone] Pager [phone]  **If unable to reach the above provider after paging please contact the Flow Manager @ [phone]  On-Call/Text Page:      [email or web address]      password [email or web address]  If 7PM-7AM, please contact night-coverage [email or web address] Password [email or web address] 04/02/2013, 2:49 PM   LOS: 1 day   I have personally examined this patient and reviewed the entire database. I have reviewed the above note, made any necessary editorial changes, and agree with its content.  Lonia Blood, MD [facility]

## 2013-04-02 NOTE — Progress Notes (Signed)
Inpatient Diabetes Program Recommendations  AACE/ADA: New Consensus Statement on Inpatient Glycemic Control (2013)  Target Ranges:  Prepandial:   less than 140 mg/dL      Peak postprandial:   less than 180 mg/dL (1-2 hours)      Critically ill patients:  140 - 180 mg/dL     Results for TEIGAN, MANNER (MRN [id number]) as of 04/02/2013 14:11  Ref. Range 04/01/2013 16:29  Glucose Latest Range: 70-99 mg/dL 696 (H)    **Admitted with DKA and flank pain.  Glucose 418 mg/dl on admission.  Patient was hydrated in the ED and started on IV insulin drip per the GlucoStabilizer.  **Patient well known to the Inpatient DM Program.  Was counseled extensively by the DM Coordinator during her last visit in June.  Per records, patient was supposed to follow up with the [facility] on June 18th.  Asked patient if she kept that appointment, however, patient told me she could not go to that appointment b/c she had to work.  Patient did not attempt to reschedule appointment.  Upon further investigation, patient told me she has scheduled an appointment at the [facility] for July 28th.  **Patient told me she takes the following insulin at home: Lantus 45 units QHS Novolog 15 units tid with meals  **During our conversation, patient told me she takes her insulin regularly and does not miss doses.  Reminded patient about the importance of taking insulin regularly and encouraged patient to keep a record off all her blood sugars (either electronic or written).  Patient stated she did not have any questions and just wanted to know when she could go home. Explained to patient that we need to make sure all of her lab work stabilizes and that we need to make sure any underlying issues have been addressed completely (ie. Flank pain).    Will follow while inpatient. Ambrose Finland RN, MSN, CDE Diabetes Coordinator Inpatient Diabetes  Program [phone]

## 2013-04-02 NOTE — Progress Notes (Signed)
Utilization review completed.  

## 2013-04-03 DIAGNOSIS — Z9119 Patient's noncompliance with other medical treatment and regimen: Secondary | ICD-10-CM

## 2013-04-03 DIAGNOSIS — Z91199 Patient's noncompliance with other medical treatment and regimen due to unspecified reason: Secondary | ICD-10-CM

## 2013-04-03 LAB — URINALYSIS, ROUTINE W REFLEX MICROSCOPIC
Bilirubin Urine: NEGATIVE
Glucose, UA: >1000 mg/dL — AB
Leukocytes, UA: NEGATIVE
Nitrite: NEGATIVE
Specific Gravity, Urine: 1.029 (ref 1.005–1.030)
pH: 7 (ref 5.0–8.0)

## 2013-04-03 LAB — POTASSIUM: Potassium: 4.5 mEq/L (ref 3.5–5.1)

## 2013-04-03 LAB — BASIC METABOLIC PANEL
BUN: 11 mg/dL (ref 6–23)
Creatinine, Ser: 0.4 mg/dL — ABNORMAL LOW (ref 0.50–1.10)
GFR calc Af Amer: >90 mL/min (ref >90)
GFR calc non Af Amer: >90 mL/min (ref >90)
Glucose, Bld: 327 mg/dL — ABNORMAL HIGH (ref 70–99)

## 2013-04-03 LAB — GLUCOSE, CAPILLARY
Glucose-Capillary: 210 mg/dL — ABNORMAL HIGH (ref 70–99)
Glucose-Capillary: 260 mg/dL — ABNORMAL HIGH (ref 70–99)
Glucose-Capillary: 311 mg/dL — ABNORMAL HIGH (ref 70–99)

## 2013-04-03 LAB — HIV ANTIBODY (ROUTINE TESTING W REFLEX): HIV: NONREACTIVE

## 2013-04-03 LAB — URINE MICROSCOPIC-ADD ON

## 2013-04-03 MED ORDER — AMOXICILLIN-POT CLAVULANATE 875-125 MG PO TABS
1.0000 | ORAL_TABLET | Freq: Two times a day (BID) | ORAL | Status: DC
  Administered 2013-04-03 – 2013-04-04 (×3): 1 via ORAL
  Filled 2013-04-03 (×4): qty 1

## 2013-04-03 MED ORDER — INSULIN GLARGINE 100 UNIT/ML ~~LOC~~ SOLN
50.0000 [IU] | Freq: Every day | SUBCUTANEOUS | Status: DC
  Administered 2013-04-03: 50 [IU] via SUBCUTANEOUS
  Filled 2013-04-03 (×2): qty 0.5

## 2013-04-03 MED ORDER — POLYETHYLENE GLYCOL 3350 17 G PO PACK
17.0000 g | PACK | Freq: Every day | ORAL | Status: DC
  Administered 2013-04-03 – 2013-04-04 (×2): 17 g via ORAL
  Filled 2013-04-03 (×3): qty 1

## 2013-04-03 MED ORDER — OXYCODONE-ACETAMINOPHEN 5-325 MG PO TABS
2.0000 | ORAL_TABLET | Freq: Once | ORAL | Status: AC
  Administered 2013-04-03: 2 via ORAL
  Filled 2013-04-03: qty 2

## 2013-04-03 MED ORDER — ONDANSETRON HCL 4 MG/2ML IJ SOLN
4.0000 mg | Freq: Four times a day (QID) | INTRAMUSCULAR | Status: DC | PRN
  Administered 2013-04-03 – 2013-04-04 (×4): 4 mg via INTRAVENOUS
  Filled 2013-04-03 (×4): qty 2

## 2013-04-03 NOTE — Progress Notes (Signed)
Pt appeared very sad this evening. She stated "I need help. I think I'm bulimic". States that this started about 6 months ago. Denies suicidal thoughts. States she would like to talk to someone about the problem in the morning.Irena Reichmann 04/03/2013

## 2013-04-03 NOTE — Progress Notes (Signed)
Notified NP on call of pt stating that she is bulimic and depressed with no suicidal ideations. Also that pt appears to want help with this issue.

## 2013-04-03 NOTE — Progress Notes (Signed)
[facility] PROGRESS NOTE  Julie [name] AVW:[id number] DOB: [birth year] DOA: 04/01/2013 PCP: No primary provider on file.  Brief narrative:  22 year old female patient with type 1 diabetes. Just discharged from this facility 02/23/2013 after treatment for DKA. Hemoglobin A1c at that time was 13. During that hospitalization she also had gluteal abscesses and was evaluated by general surgery. She had also been admitted in February of 2014 at which time she was newly diagnosed with diabetes. During the last admission it was noted she had never followed up with a primary care provider even though she had been given several resources to find a primary care provider. She also endorsed during the last admission she had stopped taking her insulin one month prior because she could not afford it (she was on 70/30 insulin which apparently was $80 at [facility]). The diabetes educator during the last admission told the patient that she can get the 70/30 insulin at [facility] for $25. Apparently the patient still has fears about giving herself injections. She was given extensive education during last hospitalization. It was also noted that the patient's Medicaid was active and that the patient could receive most of her prescriptions for $3. She was scheduled to followup with Dr. Standley Dakins at the [facility] health and wellness clinic. She presented back to the emergency department with complaints of flank pain. She was found to be in mild DKA with an anion gap of 25 and a CBG just under 500. She was started on an insulin infusion.   Assessment/Plan:  DKA, type 1/ Diabetes  -AG=12/CO2 18 at this time  -last dc was on 70/30 insulin but at admit was on Lantus so will resume for now - safe to transition off insulin gtt - will need to monitor CBG when off gtt  -increase lantus  Hypotension/ Dehydration  -resolvd  Rt flank pain  -UA negative  -no pain this am  -due to DKA itself   Sore  throat and myalgias -check HIV  Hypokalemia ---> hyperkalemia -recheck   H/O medication noncompliance  -Newly dx'd diabetic in Feb 2014 but has been inconsistent in adhering to tx plan  -Diabetes Educator saw again  Code Status: full Family Communication: patient at bedside Disposition Plan: home in AM   Consultants:  none  Procedures:  none  Antibiotics:    HPI/Subjective: multiple c/o -sore throat, aches, blood on toilet paper after urination, not sleeping for months  Objective: Filed Vitals:   04/02/13 2119 04/03/13 0154 04/03/13 0522 04/03/13 1002  BP: 99/71 102/62 94/75 107/64  Pulse: 65 65 60 71  Temp: 98.6 F (37 C) 98.1 F (36.7 C) 97.5 F (36.4 C) 98.3 F (36.8 C)  TempSrc: Oral Oral Oral Oral  Resp: 15 15 16 20   Height:      Weight:      SpO2: 100% 100% 100% 100%    Intake/Output Summary (Last 24 hours) at 04/03/13 1107 Last data filed at 04/03/13 0600  Gross per 24 hour  Intake   1466 ml  Output      0 ml  Net   1466 ml   Filed Weights   04/01/13 1621 04/01/13 2050 04/02/13 1845  Weight: 63.504 kg (140 lb) 64.9 kg (143 lb 1.3 oz) 65 kg (143 lb 4.8 oz)    Exam:  General: No acute respiratory distress  Lungs: Clear to auscultation bilaterally without wheezes or crackles, RA  Cardiovascular: Regular rate and rhythm without murmur gallop or rub normal S1 and S2,  no peripheral edema or JVD  Abdomen: Nontender, nondistended, soft, bowel sounds positive, no rebound, no ascites, no appreciable mass  Musculoskeletal: No significant cyanosis, clubbing of bilateral lower extremities  Neurological: Alert and oriented x 3, moves all extremities x 4 without focal neurological deficits, CN 2-12 intact   Data Reviewed: Basic Metabolic Panel:  Recent Labs Lab 04/01/13 2248 04/02/13 0200 04/02/13 0516 04/02/13 1148 04/03/13 0525  NA 137 136 134* 133* 135  K 2.8* 3.3* 2.8* 3.0* 5.5*  CL 107 106 108 103 104  CO2 17* 15* 16* 18* 20  GLUCOSE  149* 169* 147* 215* 327*  BUN 11 10 10 10 11   CREATININE 0.31* 0.29* 0.30* 0.32* 0.40*  CALCIUM 7.2* 7.0* 7.2* 7.5* 7.7*   Liver Function Tests:  Recent Labs Lab 04/01/13 2248  AST 10  ALT 7  ALKPHOS 80  BILITOT 0.2*  PROT 5.6*  ALBUMIN 2.6*    Recent Labs Lab 04/01/13 2248  LIPASE 21   No results found for this basename: AMMONIA,  in the last 168 hours CBC:  Recent Labs Lab 04/01/13 1629 04/01/13 2248 04/02/13 0516  WBC 8.5 7.5 5.9  NEUTROABS 6.2  --  2.3  HGB 12.3 12.1 11.6*  HCT 35.7* 34.3* 33.1*  MCV 89.0 87.1 87.3  PLT 208 212 177   Cardiac Enzymes: No results found for this basename: CKTOTAL, CKMB, CKMBINDEX, TROPONINI,  in the last 168 hours BNP (last 3 results) No results found for this basename: PROBNP,  in the last 8760 hours CBG:  Recent Labs Lab 04/02/13 1219 04/02/13 1325 04/02/13 1439 04/02/13 2117 04/03/13 0800  GLUCAP 194* 215* 190* 318* 294*    Recent Results (from the past 240 hour(s))  MRSA PCR SCREENING     Status: None   Collection Time    04/02/13  1:58 AM      Result Value Range Status   MRSA by PCR NEGATIVE  NEGATIVE Final   Comment:            The GeneXpert MRSA Assay (FDA     approved for NASAL specimens     only), is one component of a     comprehensive MRSA colonization     surveillance program. It is not     intended to diagnose MRSA     infection nor to guide or     monitor treatment for     MRSA infections.     Studies: No results found.  Scheduled Meds: . amoxicillin-clavulanate  1 tablet Oral Q12H  . enoxaparin (LOVENOX) injection  40 mg Subcutaneous Q24H  . gabapentin  100 mg Oral BID  . insulin aspart  0-15 Units Subcutaneous TID WC  . insulin aspart  0-5 Units Subcutaneous QHS  . insulin aspart  5 Units Subcutaneous TID WC  . insulin glargine  45 Units Subcutaneous QHS  . polyethylene glycol  17 g Oral Daily  . potassium chloride  40 mEq Oral Once  . senna  1 tablet Oral QHS   Continuous  Infusions: . sodium chloride 100 mL/hr at 04/03/13 1030    Active Problems:   DKA, type 1   Hypotension   Hypokalemia   Rt flank pain   Diabetes   H/O medication noncompliance   Dehydration    Time spent: 40    [facility], JESSICA  [facility] Pager [phone] If 7PM-7AM, please contact night-coverage at [email or web address], password [facility] 04/03/2013, 11:07 AM  LOS: 2 days

## 2013-04-03 NOTE — Progress Notes (Signed)
Inpatient Diabetes Program Recommendations  AACE/ADA: New Consensus Statement on Inpatient Glycemic Control (2013)  Target Ranges:  Prepandial:   less than 140 mg/dL      Peak postprandial:   less than 180 mg/dL (1-2 hours)      Critically ill patients:  140 - 180 mg/dL     Results for ANGALA, HILGERS (MRN [id number]) as of 04/03/2013 13:28  Ref. Range 04/03/2013 08:00 04/03/2013 12:07  Glucose-Capillary Latest Range: 70-99 mg/dL 147 (H) 829 (H)    DKA resolved.  Now on Lantus and Novolog SQ.  MD- Patient was taking Lantus and Novolog prior to admission.  Please make sure to send her home on Lantus and Novolog at discharge.  Patient has a scheduled appointment at the [facility] Department on July 28th.   Will follow. Ambrose Finland RN, MSN, CDE Diabetes Coordinator Inpatient Diabetes Program [phone]

## 2013-04-04 DIAGNOSIS — E860 Dehydration: Secondary | ICD-10-CM

## 2013-04-04 DIAGNOSIS — F3289 Other specified depressive episodes: Secondary | ICD-10-CM

## 2013-04-04 DIAGNOSIS — F329 Major depressive disorder, single episode, unspecified: Secondary | ICD-10-CM

## 2013-04-04 LAB — GLUCOSE, CAPILLARY: Glucose-Capillary: 394 mg/dL — ABNORMAL HIGH (ref 70–99)

## 2013-04-04 LAB — BASIC METABOLIC PANEL
Calcium: 8.2 mg/dL — ABNORMAL LOW (ref 8.4–10.5)
Creatinine, Ser: 0.4 mg/dL — ABNORMAL LOW (ref 0.50–1.10)
GFR calc Af Amer: >90 mL/min (ref >90)
GFR calc non Af Amer: >90 mL/min (ref >90)

## 2013-04-04 LAB — CBC
MCH: 31.1 pg (ref 26.0–34.0)
MCV: 87.3 fL (ref 78.0–100.0)
Platelets: 182 10*3/uL (ref 150–400)
RDW: 13.1 % (ref 11.5–15.5)

## 2013-04-04 MED ORDER — FLUOXETINE HCL 10 MG PO CAPS
10.0000 mg | ORAL_CAPSULE | Freq: Every day | ORAL | Status: DC
  Administered 2013-04-04 – 2013-04-05 (×2): 10 mg via ORAL
  Filled 2013-04-04 (×2): qty 1

## 2013-04-04 MED ORDER — INSULIN ASPART 100 UNIT/ML ~~LOC~~ SOLN
7.0000 [IU] | Freq: Three times a day (TID) | SUBCUTANEOUS | Status: DC
  Administered 2013-04-04 – 2013-04-05 (×3): 7 [IU] via SUBCUTANEOUS

## 2013-04-04 MED ORDER — INSULIN GLARGINE 100 UNIT/ML ~~LOC~~ SOLN
55.0000 [IU] | Freq: Every day | SUBCUTANEOUS | Status: DC
  Administered 2013-04-04: 55 [IU] via SUBCUTANEOUS
  Filled 2013-04-04 (×2): qty 0.55

## 2013-04-04 NOTE — Progress Notes (Signed)
Inpatient Diabetes Program Recommendations  AACE/ADA: New Consensus Statement on Inpatient Glycemic Control (2013)  Target Ranges:  Prepandial:   less than 140 mg/dL      Peak postprandial:   less than 180 mg/dL (1-2 hours)      Critically ill patients:  140 - 180 mg/dL     Results for PANHIA, KARL (MRN [id number]) as of 04/04/2013 08:40  Ref. Range 04/03/2013 08:00 04/03/2013 12:07 04/03/2013 16:21 04/03/2013 21:39  Glucose-Capillary Latest Range: 70-99 mg/dL 409 (H) 811 (H) 914 (H) 311 (H)    Results for MELORA, MENON (MRN [id number]) as of 04/04/2013 08:40  Ref. Range 04/04/2013 08:04  Glucose-Capillary Latest Range: 70-99 mg/dL 086 (H)    **Noted Novolog meal coverage increased to 7 units tid with meals today  **Fasting glucose levels still elevated  MD- Please consider increasing Lantus to 55 units QHS   Will follow. Ambrose Finland RN, MSN, CDE Diabetes Coordinator Inpatient Diabetes Program [phone]

## 2013-04-04 NOTE — Progress Notes (Addendum)
[facility] PROGRESS NOTE  Lauryl Seyer Martinez-Vargas ZOX:[id number] DOB: 22-Dec-1990 DOA: 04/01/2013 PCP: No primary provider on file.  Brief narrative:  22 year old female patient with type 1 diabetes. Just discharged from this facility 02/23/2013 after treatment for DKA. Hemoglobin A1c at that time was 13. During that hospitalization she also had gluteal abscesses and was evaluated by general surgery. She had also been admitted in February of 2014 at which time she was newly diagnosed with diabetes. During the last admission it was noted she had never followed up with a primary care provider even though she had been given several resources to find a primary care provider. She also endorsed during the last admission she had stopped taking her insulin one month prior because she could not afford it (she was on 70/30 insulin which apparently was $80 at [facility]). The diabetes educator during the last admission told the patient that she can get the 70/30 insulin at [facility] for $25. Apparently the patient still has fears about giving herself injections. She was given extensive education during last hospitalization. It was also noted that the patient's Medicaid was active and that the patient could receive most of her prescriptions for $3. She was scheduled to followup with Dr. Standley Dakins at the [facility] health and wellness clinic. She presented back to the emergency department with complaints of flank pain. She was found to be in mild DKA with an anion gap of 25 and a CBG just under 500. She was started on an insulin infusion.   Assessment/Plan:  DKA, type 1/ Diabetes  -AG=12/CO2 18 at this time  -last dc was on 70/30 insulin but at admit was on Lantus so will resume for now - safe to transition off insulin gtt - will need to monitor CBG when off gtt  -increase lantus  Hypotension/ Dehydration  -resolvd  Rt flank pain  -UA negative  -no pain this am  -due to DKA itself   Sore  throat and myalgias - HIV neg -prob from making herself vomit  Hypokalemia ---> hyperkalemia -recheck   H/O medication noncompliance  -Newly dx'd diabetic in Feb 2014 but has been inconsistent in adhering to tx plan  -Diabetes Educator saw again  Depression/bulemia -contributing to patient's  -was forcing self to vomit -no suicidal ideations  Addendum: Spoke with extender for psych- would like patient to see nutrition education and to be seen by psych dr before d/c  Code Status: full Family Communication: patient at bedside Disposition Plan: home in AM   Consultants:  none  Procedures:  none  Antibiotics:    HPI/Subjective: Making self vomit after meals  Objective: Filed Vitals:   04/03/13 2127 04/04/13 0212 04/04/13 0552 04/04/13 0617  BP: 91/54 105/66 86/49 100/72  Pulse: 78 74 89   Temp: 99 F (37.2 C) 97.7 F (36.5 C) 98.2 F (36.8 C)   TempSrc: Oral Oral Oral   Resp: 20 18 18    Height:      Weight:      SpO2: 100% 99% 100%     Intake/Output Summary (Last 24 hours) at 04/04/13 0805 Last data filed at 04/03/13 2300  Gross per 24 hour  Intake    240 ml  Output      1 ml  Net    239 ml   Filed Weights   04/01/13 1621 04/01/13 2050 04/02/13 1845  Weight: 63.504 kg (140 lb) 64.9 kg (143 lb 1.3 oz) 65 kg (143 lb 4.8 oz)    Exam:  General: No acute respiratory distress  Lungs: Clear to auscultation bilaterally without wheezes or crackles, RA  Cardiovascular: Regular rate and rhythm without murmur gallop or rub normal S1 and S2, no peripheral edema or JVD  Abdomen: Nontender, nondistended, soft, bowel sounds positive, no rebound, no ascites, no appreciable mass  Musculoskeletal: No significant cyanosis, clubbing of bilateral lower extremities  Neurological: Alert and oriented x 3, moves all extremities x 4 without focal neurological deficits, CN 2-12 intact   Data Reviewed: Basic Metabolic Panel:  Recent Labs Lab 04/02/13 0200  04/02/13 0516 04/02/13 1148 04/03/13 0525 04/03/13 1220 04/04/13 0445  NA 136 134* 133* 135  --  138  K 3.3* 2.8* 3.0* 5.5* 4.5 3.8  CL 106 108 103 104  --  106  CO2 15* 16* 18* 20  --  26  GLUCOSE 169* 147* 215* 327*  --  276*  BUN 10 10 10 11   --  11  CREATININE 0.29* 0.30* 0.32* 0.40*  --  0.40*  CALCIUM 7.0* 7.2* 7.5* 7.7*  --  8.2*   Liver Function Tests:  Recent Labs Lab 04/01/13 2248  AST 10  ALT 7  ALKPHOS 80  BILITOT 0.2*  PROT 5.6*  ALBUMIN 2.6*    Recent Labs Lab 04/01/13 2248  LIPASE 21   No results found for this basename: AMMONIA,  in the last 168 hours CBC:  Recent Labs Lab 04/01/13 1629 04/01/13 2248 04/02/13 0516 04/04/13 0445  WBC 8.5 7.5 5.9 6.2  NEUTROABS 6.2  --  2.3  --   HGB 12.3 12.1 11.6* 12.7  HCT 35.7* 34.3* 33.1* 35.6*  MCV 89.0 87.1 87.3 87.3  PLT 208 212 177 182   Cardiac Enzymes: No results found for this basename: CKTOTAL, CKMB, CKMBINDEX, TROPONINI,  in the last 168 hours BNP (last 3 results) No results found for this basename: PROBNP,  in the last 8760 hours CBG:  Recent Labs Lab 04/02/13 2117 04/03/13 0800 04/03/13 1207 04/03/13 1621 04/03/13 2139  GLUCAP 318* 294* 210* 260* 311*    Recent Results (from the past 240 hour(s))  MRSA PCR SCREENING     Status: None   Collection Time    04/02/13  1:58 AM      Result Value Range Status   MRSA by PCR NEGATIVE  NEGATIVE Final   Comment:            The GeneXpert MRSA Assay (FDA     approved for NASAL specimens     only), is one component of a     comprehensive MRSA colonization     surveillance program. It is not     intended to diagnose MRSA     infection nor to guide or     monitor treatment for     MRSA infections.     Studies: No results found.  Scheduled Meds: . amoxicillin-clavulanate  1 tablet Oral Q12H  . enoxaparin (LOVENOX) injection  40 mg Subcutaneous Q24H  . gabapentin  100 mg Oral BID  . insulin aspart  0-15 Units Subcutaneous TID WC  .  insulin aspart  0-5 Units Subcutaneous QHS  . insulin aspart  5 Units Subcutaneous TID WC  . insulin glargine  50 Units Subcutaneous QHS  . polyethylene glycol  17 g Oral Daily  . senna  1 tablet Oral QHS   Continuous Infusions:    Active Problems:   DKA, type 1   Hypotension   Hypokalemia   Rt flank pain  Diabetes   H/O medication noncompliance   Dehydration    Time spent: 75    [facility], JESSICA  [facility] Pager [phone] If 7PM-7AM, please contact night-coverage at [email or web address], password [facility] 04/04/2013, 8:05 AM  LOS: 3 days

## 2013-04-04 NOTE — Consult Note (Signed)
Reason for Consult: Bulimia  Referring Physician: Dr. Heber Farmington Julie [name] is an 22 y.o. female.  HPI: The patient initially stated she just wanted to go home and did not like to talk about her problems, guarded.  However, she is visibly depressed and expresses this but does deny suicidal and homicidal ideations.  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  She is currently paying her bills and apartment and her mother's bills, even though her mother is physically able to work.  Her mother does care for her 78 yo daughter but the client is currently exhausted.  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  Julie [name] moved to the [place] from [place] at the age of 24 where she had been living with her grandmother whom she has a positive, supportive relationship but she resides in [place].   She began work at the age of 67 after her mother became divorced.  An emotional connection is lacking with her mother, patient desires one but her mother displays little attention to her.  The patient was upset that she came to the hospital for five minutes total and only to ask her for money to go to [place], very hurtful for the patient who would like her mother to be caring and loving to her.  Julie [name] receives no financial or physical support from the father of the baby since he went back to [place] when her baby was 21 old.  Meanwhile, the patient has had multiple admissions to the hospital, appears she may use it as a coping mechanism.  She also shared that she makes herself throw up because she use to be fat and does not want to get fat again.  Tried to educate her on healthy eating but decided this could be better addressed with a nutritional consult (requested her RN to place one).  She suffers from poor self-esteem and could not  say one thing she likes about herself so this assessor named a few and encouraged her to work on this during hospitalization.  The client would not let her blinds be open to see the sun despite encouragement.  Homework given to name three positive things about herself and will follow-up with her tomorrow.  Dr. Lucianne Muss, psychiatrist, reviewed this client and agrees on the treatment plan, will also examine the patient tomorrow as well.  Past Medical History  Diagnosis Date  . Diabetes mellitus without complication     Past Surgical History  Procedure Laterality Date  . Cesarean section    . Cesarean section  2012    Family History  Problem Relation Age of Onset  . Diabetes Mother     Social History:  reports that she has never smoked. She does not have any smokeless tobacco history on file. She reports that she does not drink alcohol or use illicit drugs.  Allergies: No Known Allergies  Medications: Julie have reviewed the patient's current medications.  Results for orders placed during the hospital encounter of 04/01/13 (from the past 48 hour(s))  GLUCOSE, CAPILLARY     Status: Abnormal   Collection Time    04/02/13  9:17 PM      Result Value Range   Glucose-Capillary 318 (*) 70 - 99 mg/dL  BASIC METABOLIC PANEL  Status: Abnormal   Collection Time    04/03/13  5:25 AM      Result Value Range   Sodium 135  135 - 145 mEq/L   Potassium 5.5 (*) 3.5 - 5.1 mEq/L   Comment: MARKED HEMOLYSIS     HEMOLYSIS AT THIS LEVEL MAY AFFECT RESULT     LIPEMIC SPECIMEN     POST-ULTRACENTRIFUGATION   Chloride 104  96 - 112 mEq/L   CO2 20  19 - 32 mEq/L   Glucose, Bld 327 (*) 70 - 99 mg/dL   BUN 11  6 - 23 mg/dL   Creatinine, Ser 9.60 (*) 0.50 - 1.10 mg/dL   Calcium 7.7 (*) 8.4 - 10.5 mg/dL   GFR calc non Af Amer >90  >90 mL/min   GFR calc Af Amer >90  >90 mL/min   Comment:            The eGFR has been calculated     using the CKD EPI equation.     This calculation has not been      validated in all clinical     situations.     eGFR's persistently     <90 mL/min signify     possible Chronic Kidney Disease.  GLUCOSE, CAPILLARY     Status: Abnormal   Collection Time    04/03/13  8:00 AM      Result Value Range   Glucose-Capillary 294 (*) 70 - 99 mg/dL  GLUCOSE, CAPILLARY     Status: Abnormal   Collection Time    04/03/13 12:07 PM      Result Value Range   Glucose-Capillary 210 (*) 70 - 99 mg/dL  POTASSIUM     Status: None   Collection Time    04/03/13 12:20 PM      Result Value Range   Potassium 4.5  3.5 - 5.1 mEq/L   Comment: HEMOLYSIS AT THIS LEVEL MAY AFFECT RESULT     LIPEMIC SPECIMEN  HIV ANTIBODY (ROUTINE TESTING)     Status: None   Collection Time    04/03/13 12:20 PM      Result Value Range   HIV NON REACTIVE  NON REACTIVE  URINALYSIS, ROUTINE W REFLEX MICROSCOPIC     Status: Abnormal   Collection Time    04/03/13 12:22 PM      Result Value Range   Color, Urine YELLOW  YELLOW   APPearance CLEAR  CLEAR   Specific Gravity, Urine 1.029  1.005 - 1.030   pH 7.0  5.0 - 8.0   Glucose, UA >1000 (*) NEGATIVE mg/dL   Hgb urine dipstick NEGATIVE  NEGATIVE   Bilirubin Urine NEGATIVE  NEGATIVE   Ketones, ur 40 (*) NEGATIVE mg/dL   Protein, ur NEGATIVE  NEGATIVE mg/dL   Urobilinogen, UA 1.0  0.0 - 1.0 mg/dL   Nitrite NEGATIVE  NEGATIVE   Leukocytes, UA NEGATIVE  NEGATIVE  URINE MICROSCOPIC-ADD ON     Status: Abnormal   Collection Time    04/03/13 12:22 PM      Result Value Range   Squamous Epithelial / LPF FEW (*) RARE   WBC, UA 0-2  <3 WBC/hpf   Urine-Other YEAST    GLUCOSE, CAPILLARY     Status: Abnormal   Collection Time    04/03/13  4:21 PM      Result Value Range   Glucose-Capillary 260 (*) 70 - 99 mg/dL  GLUCOSE, CAPILLARY     Status: Abnormal   Collection Time  04/03/13  9:39 PM      Result Value Range   Glucose-Capillary 311 (*) 70 - 99 mg/dL   Comment 1 Documented in Chart     Comment 2 Notify RN    CBC     Status: Abnormal    Collection Time    04/04/13  4:45 AM      Result Value Range   WBC 6.2  4.0 - 10.5 K/uL   RBC 4.08  3.87 - 5.11 MIL/uL   Hemoglobin 12.7  12.0 - 15.0 g/dL   HCT 16.1 (*) 09.6 - 04.5 %   MCV 87.3  78.0 - 100.0 fL   MCH 31.1  26.0 - 34.0 pg   MCHC 35.7  30.0 - 36.0 g/dL   RDW 40.9  81.1 - 91.4 %   Platelets 182  150 - 400 K/uL  BASIC METABOLIC PANEL     Status: Abnormal   Collection Time    04/04/13  4:45 AM      Result Value Range   Sodium 138  135 - 145 mEq/L   Potassium 3.8  3.5 - 5.1 mEq/L   Comment: HEMOLYSIS AT THIS LEVEL MAY AFFECT RESULT   Chloride 106  96 - 112 mEq/L   CO2 26  19 - 32 mEq/L   Glucose, Bld 276 (*) 70 - 99 mg/dL   BUN 11  6 - 23 mg/dL   Creatinine, Ser 7.82 (*) 0.50 - 1.10 mg/dL   Calcium 8.2 (*) 8.4 - 10.5 mg/dL   GFR calc non Af Amer >90  >90 mL/min   GFR calc Af Amer >90  >90 mL/min   Comment:            The eGFR has been calculated     using the CKD EPI equation.     This calculation has not been     validated in all clinical     situations.     eGFR's persistently     <90 mL/min signify     possible Chronic Kidney Disease.  GLUCOSE, CAPILLARY     Status: Abnormal   Collection Time    04/04/13  8:04 AM      Result Value Range   Glucose-Capillary 227 (*) 70 - 99 mg/dL  GLUCOSE, CAPILLARY     Status: Abnormal   Collection Time    04/04/13 11:43 AM      Result Value Range   Glucose-Capillary 273 (*) 70 - 99 mg/dL   Comment 1 Documented in Chart     Comment 2 Notify RN    GLUCOSE, CAPILLARY     Status: Abnormal   Collection Time    04/04/13  5:10 PM      Result Value Range   Glucose-Capillary 394 (*) 70 - 99 mg/dL   Comment 1 Documented in Chart     Comment 2 Notify RN      No results found.  Review of Systems  Constitutional: Negative.   HENT: Negative.   Eyes: Negative.   Respiratory: Negative.   Cardiovascular: Negative.   Gastrointestinal: Negative.   Genitourinary: Negative.   Musculoskeletal: Negative.   Skin: Negative.    Neurological: Negative.   Endo/Heme/Allergies: Negative.   Psychiatric/Behavioral: Positive for depression. The patient is nervous/anxious.    Blood pressure 107/67, pulse 62, temperature 98.2 F (36.8 C), temperature source Oral, resp. rate 18, height 5\' 10"  (1.778 m), weight 65 kg (143 lb 4.8 oz), SpO2 100.00%. Physical Exam Completed by primary MD, reviewed  Family History:  No family history on file.  Assessment/Plan:   Mental Status Examination/Evaluation: Patient is disheveled and guarded initially. She is anxious but cooperative, reluctant to talk at first but then engaged easily.  Depressed mood and affect.  Denies any active or passive suicidal thoughts or homicidal thoughts.  Her thoughts are organized and reality based.  There is no paranoia or delusions present at this time. He denies any auditory or visual hallucination. Her attention and concentration is fair.  Her insight and judgment are fair.  DIAGNOSIS:  AXIS Julie  Major Depressive Disorder  AXIS II  Deferred   AXIS III  Diabetes  AXIS IV  other psychosocial or environmental problems, work issues, single parent, problems with access to health care services and problems with primary support group   AXIS V  51-60 moderate symptoms    Assessment/Plan:  Recommend Prozac 10 mg daily for bulimia and depression and nutrition consult with a dietician for education on healthy eating. Patient does not need inpatient psychiatric treatment. Recommend follow-up treatment at [facility] and appointment will be made after discussion with Dr Benjamine Mola this evening who thought this would be very beneficial for the patient.  Julie [name], Julie [name] 04/04/2013, 7:17 PM

## 2013-04-05 DIAGNOSIS — E1010 Type 1 diabetes mellitus with ketoacidosis without coma: Principal | ICD-10-CM

## 2013-04-05 LAB — GLUCOSE, CAPILLARY
Glucose-Capillary: 234 mg/dL — ABNORMAL HIGH (ref 70–99)
Glucose-Capillary: 237 mg/dL — ABNORMAL HIGH (ref 70–99)
Glucose-Capillary: 394 mg/dL — ABNORMAL HIGH (ref 70–99)

## 2013-04-05 MED ORDER — INSULIN ASPART 100 UNIT/ML ~~LOC~~ SOLN
10.0000 [IU] | Freq: Three times a day (TID) | SUBCUTANEOUS | Status: DC
  Administered 2013-04-05: 10 [IU] via SUBCUTANEOUS

## 2013-04-05 MED ORDER — FLUOXETINE HCL 10 MG PO CAPS: 10.0000 mg | ORAL_CAPSULE | Freq: Every day | ORAL | Status: DC

## 2013-04-05 MED ORDER — INSULIN ASPART 100 UNIT/ML ~~LOC~~ SOLN: 10.0000 [IU] | Freq: Three times a day (TID) | SUBCUTANEOUS | Status: DC

## 2013-04-05 MED ORDER — INSULIN GLARGINE 100 UNIT/ML ~~LOC~~ SOLN: 55.0000 [IU] | Freq: Every day | SUBCUTANEOUS | Status: DC

## 2013-04-05 NOTE — Plan of Care (Signed)
Problem: Limited Adherence to Nutrition-Related Recommendations (NB-1.6) Goal: Nutrition education Formal process to instruct or train a patient/client in a skill or to impart knowledge to help patients/clients voluntarily manage or modify food choices and eating behavior to maintain or improve health. Outcome: Completed/Met Date Met:  04/05/13  RD consulted for nutrition education regarding diabetes.     Lab Results  Component Value Date    HGBA1C 12.7* 04/02/2013    RD provided "Carbohydrate Counting for People with Diabetes" and "Choose Your Foods: Exchange Lists for Diabetes" handouts from the Academy of Nutrition and Dietetics in Spanish.  Provided list of carbohydrates and recommended serving sizes of common foods.  Patient more concern about gaining weight asking "is this going to make me fat?"  Noted patient with history of bulimia and purging. Psychiatry following.  Maureen Chatters, RD, LDN Pager #: [phone] After-Hours Pager #: [phone]

## 2013-04-05 NOTE — Progress Notes (Signed)
Inpatient Diabetes Program Recommendations  AACE/ADA: New Consensus Statement on Inpatient Glycemic Control (2013)  Target Ranges:  Prepandial:   less than 140 mg/dL      Peak postprandial:   less than 180 mg/dL (1-2 hours)      Critically ill patients:  140 - 180 mg/dL     Results for MADELON, WELSCH (MRN [id number]) as of 04/05/2013 09:38  Ref. Range 04/04/2013 08:04 04/04/2013 11:43 04/04/2013 17:10 04/04/2013 21:34  Glucose-Capillary Latest Range: 70-99 mg/dL 629 (H) 528 (H) 413 (H) 234 (H)    Results for SHONTIA, GILLOOLY (MRN [id number]) as of 04/05/2013 09:38  Ref. Range 04/05/2013 08:20  Glucose-Capillary Latest Range: 70-99 mg/dL 536 (H)    **Noted Lantus increased to 55 units QHS last night- Patient continues to have elevated CBGs.  Of note, per records, patient was supposedly taking Novolog 15 units tid with meals at home before admission.  MD- Please consider increasing Novolog meal coverage to 10 units tid with meals (currently on Novolog 7 units tid with meals)   Will follow. Ambrose Finland RN, MSN, CDE Diabetes Coordinator Inpatient Diabetes Program [phone]

## 2013-04-05 NOTE — Discharge Summary (Signed)
Physician Discharge Summary  Patient ID: Julie [name] MRN: [id number] DOB/AGE: [birth year] 22 y.o.  Admit date: 04/01/2013 Discharge date: 04/05/2013  Primary Care Physician:  No primary provider on file.  Discharge Diagnoses:    . DKA, type 1 . Hypotension . Rt flank pain . Diabetes . Dehydration . Hypokalemia Bulimia with depression  Consults: Psychiatry  Recommendations for Outpatient Follow-up:  1. patient was recommended to followup with her PCP within a week to adjust her insulin. 2. patient was given information regarding [facility] for followup   Allergies:  No Known Allergies   Discharge Medications:   Medication List         diphenhydrAMINE 25 mg capsule  Commonly known as:  BENADRYL  Take 25 mg by mouth daily as needed for sleep.     FLUoxetine 10 MG capsule  Commonly known as:  PROZAC  Take 1 capsule (10 mg total) by mouth daily.     ibuprofen 200 MG tablet  Commonly known as:  ADVIL,MOTRIN  Take 400 mg by mouth daily as needed for pain.     insulin aspart 100 UNIT/ML injection  Commonly known as:  novoLOG  Inject 10 Units into the skin 3 (three) times daily with meals.     insulin glargine 100 UNIT/ML injection  Commonly known as:  LANTUS  Inject 0.55 mLs (55 Units total) into the skin at bedtime.         Brief H and P: For complete details please refer to admission H and P, but in brief, 22 yo female type 1 dm with h/o noncompliance with insulin in past was out shopping on the day of admission, when she started having right flank pain with no radiation. No fevers. No n/v. No radiation. No dysuria. Some blood noted in urine. Glucose has been high. [facility] and found to be in dka, [facility].  Pt reports she has been compliant with her insulin. Her last hemoglobin A1c in June was over 13.   Hospital Course:  DKA, type 1/ Diabetes:  Patient was admitted to  step down unit and was placed on insulin drip until the DKA was resolved. Last dc was on 70/30 insulin but at admit was on Lantus. Lantus was resumed in the diabetic coordinator consult was obtained as well as nutrition consult. Lantus was increased to 55 units and NovoLog with meals to 10 units TID. Patient was given nutrition counseling.  Hypotension/ Dehydration -resolved   Rt flank pain: Resolved, likely due to DKA, UA negative for UTI.   Sore throat and myalgias  - HIV neg, prob from making herself vomit   H/O medication noncompliance  -Newly dx'd diabetic in Feb 2014 but has been inconsistent in adhering to tx plan  - She will be followed at [facility].  Depression/bulemia: Psych consultation was obtained patient had no suicidal ideation. She was started on Prozac per psych recommendations. Patient was given information about [facility] mental health facility as first available appointment at [facility] is in October. Patient was recommended to make an appointment.    Day of Discharge BP 102/58  Pulse 75  Temp(Src) 97.5 F (36.4 C) (Oral)  Resp 18  Ht 5\' 10"  (1.778 m)  Wt 65 kg (143 lb 4.8 oz)  BMI 20.56 kg/m2  SpO2 100%  Physical Exam: General: Alert and awake oriented x3 not in any acute distress. HEENT: anicteric sclera, pupils reactive to light  and accommodation CVS: S1-S2 clear no murmur rubs or gallops Chest: clear to auscultation bilaterally, no wheezing rales or rhonchi Abdomen: soft nontender, nondistended, normal bowel sounds Extremities: no cyanosis, clubbing or edema noted bilaterally Neuro: Cranial nerves II-XII intact, no focal neurological deficits   The results of significant diagnostics from this hospitalization (including imaging, microbiology, ancillary and laboratory) are listed below for reference.    LAB RESULTS: Basic Metabolic Panel:  Recent Labs Lab 04/03/13 0525 04/03/13 1220 04/04/13 0445  NA 135  --  138  K 5.5* 4.5 3.8   CL 104  --  106  CO2 20  --  26  GLUCOSE 327*  --  276*  BUN 11  --  11  CREATININE 0.40*  --  0.40*  CALCIUM 7.7*  --  8.2*   Liver Function Tests:  Recent Labs Lab 04/01/13 2248  AST 10  ALT 7  ALKPHOS 80  BILITOT 0.2*  PROT 5.6*  ALBUMIN 2.6*    Recent Labs Lab 04/01/13 2248  LIPASE 21   No results found for this basename: AMMONIA,  in the last 168 hours CBC:  Recent Labs Lab 04/02/13 0516 04/04/13 0445  WBC 5.9 6.2  NEUTROABS 2.3  --   HGB 11.6* 12.7  HCT 33.1* 35.6*  MCV 87.3 87.3  PLT 177 182   Cardiac Enzymes: No results found for this basename: CKTOTAL, CKMB, CKMBINDEX, TROPONINI,  in the last 168 hours BNP: No components found with this basename: POCBNP,  CBG:  Recent Labs Lab 04/04/13 2134 04/05/13 0820  GLUCAP 234* 237*    Significant Diagnostic Studies:  No results found.    Disposition and Follow-up:     Discharge Orders   Future Orders Complete By Expires     Diet Carb Modified  As directed     Discharge instructions  As directed     Comments:      [facility] psych facility has walk-in appointments, please follow-up there for depression. You need to follow-up with your primary doctor asap in 1 week for hospital follow-up.    Increase activity slowly  As directed         DISPOSITION: Home  DIET: Carb modified diet  ACTIVITY: As tolerated   DISCHARGE FOLLOW-UP Follow-up Information   Please follow up.   Contact information:   please follow-up with your PCP asap in 1 week        Follow up with [facility]. Schedule an appointment as soon as possible for a visit in 2 weeks. (May walk in to be seen Monday thru Friday from 8 am to 3 pm and by appointment to 5 pm.  You may make an appointment to avoid wait time at [phone].  The crisis center at [facility] is open 24/7.  )    Contact information:   [address] [place] [address] [phone]      Time spent on Discharge: 38  mins  Signed:   RAI,RIPUDEEP M.D. [facility] 04/05/2013, 12:28 PM Pager: [phone]

## 2013-04-05 NOTE — Consult Note (Signed)
[facility] called and first available appointment is October but since she has Medicaid she can walk into [facility] and be seen to continue her care.  Her primary RN and patient was notified with this information--may walk in to be seen Monday thru Friday from 8 am to 3 pm and by appointment to 5 pm.  She may make an appointment to avoid wait time at [phone].  The crisis center at [facility] is open 24/7.

## 2013-04-08 NOTE — Consult Note (Signed)
Patient seen, evaluated and plan formulated by me 

## 2013-04-08 NOTE — Consult Note (Signed)
Patient discussed and plan formulated by me

## 2013-06-21 ENCOUNTER — Encounter (HOSPITAL_BASED_OUTPATIENT_CLINIC_OR_DEPARTMENT_OTHER): Payer: Self-pay | Admitting: *Deleted

## 2013-06-21 ENCOUNTER — Inpatient Hospital Stay (HOSPITAL_BASED_OUTPATIENT_CLINIC_OR_DEPARTMENT_OTHER)
Admission: EM | Admit: 2013-06-21 | Discharge: 2013-06-25 | DRG: 638 | Disposition: A | Discharge status: Home / Self Care | Payer: Medicaid Other | Attending: Internal Medicine | Admitting: Internal Medicine

## 2013-06-21 DIAGNOSIS — F322 Major depressive disorder, single episode, severe without psychotic features: Secondary | ICD-10-CM

## 2013-06-21 DIAGNOSIS — Z72820 Sleep deprivation: Secondary | ICD-10-CM

## 2013-06-21 DIAGNOSIS — R630 Anorexia: Secondary | ICD-10-CM | POA: Diagnosis present

## 2013-06-21 DIAGNOSIS — Z91148 Patient's other noncompliance with medication regimen for other reason: Secondary | ICD-10-CM

## 2013-06-21 DIAGNOSIS — R10A1 Flank pain, right side: Secondary | ICD-10-CM | POA: Diagnosis present

## 2013-06-21 DIAGNOSIS — M549 Dorsalgia, unspecified: Secondary | ICD-10-CM | POA: Diagnosis present

## 2013-06-21 DIAGNOSIS — Z23 Encounter for immunization: Secondary | ICD-10-CM

## 2013-06-21 DIAGNOSIS — R29898 Other symptoms and signs involving the musculoskeletal system: Secondary | ICD-10-CM

## 2013-06-21 DIAGNOSIS — L080 Pyoderma: Secondary | ICD-10-CM

## 2013-06-21 DIAGNOSIS — R519 Headache, unspecified: Secondary | ICD-10-CM | POA: Diagnosis present

## 2013-06-21 DIAGNOSIS — R300 Dysuria: Secondary | ICD-10-CM | POA: Diagnosis present

## 2013-06-21 DIAGNOSIS — Z794 Long term (current) use of insulin: Secondary | ICD-10-CM

## 2013-06-21 DIAGNOSIS — E860 Dehydration: Secondary | ICD-10-CM | POA: Diagnosis present

## 2013-06-21 DIAGNOSIS — R51 Headache: Secondary | ICD-10-CM

## 2013-06-21 DIAGNOSIS — Z91199 Patient's noncompliance with other medical treatment and regimen due to unspecified reason: Secondary | ICD-10-CM

## 2013-06-21 DIAGNOSIS — E876 Hypokalemia: Secondary | ICD-10-CM | POA: Diagnosis present

## 2013-06-21 DIAGNOSIS — Z833 Family history of diabetes mellitus: Secondary | ICD-10-CM

## 2013-06-21 DIAGNOSIS — F329 Major depressive disorder, single episode, unspecified: Secondary | ICD-10-CM | POA: Diagnosis present

## 2013-06-21 DIAGNOSIS — Z9119 Patient's noncompliance with other medical treatment and regimen: Secondary | ICD-10-CM

## 2013-06-21 DIAGNOSIS — E1010 Type 1 diabetes mellitus with ketoacidosis without coma: Secondary | ICD-10-CM | POA: Diagnosis present

## 2013-06-21 DIAGNOSIS — I959 Hypotension, unspecified: Secondary | ICD-10-CM

## 2013-06-21 DIAGNOSIS — K0510 Chronic gingivitis, plaque induced: Secondary | ICD-10-CM

## 2013-06-21 DIAGNOSIS — F418 Other specified anxiety disorders: Secondary | ICD-10-CM | POA: Diagnosis present

## 2013-06-21 DIAGNOSIS — R109 Unspecified abdominal pain: Secondary | ICD-10-CM | POA: Diagnosis present

## 2013-06-21 DIAGNOSIS — Z9114 Patient's other noncompliance with medication regimen: Secondary | ICD-10-CM

## 2013-06-21 DIAGNOSIS — F411 Generalized anxiety disorder: Secondary | ICD-10-CM | POA: Diagnosis present

## 2013-06-21 DIAGNOSIS — E1110 Type 2 diabetes mellitus with ketoacidosis without coma: Secondary | ICD-10-CM

## 2013-06-21 DIAGNOSIS — E119 Type 2 diabetes mellitus without complications: Secondary | ICD-10-CM

## 2013-06-21 HISTORY — DX: Patient's noncompliance with other medical treatment and regimen due to unspecified reason: Z91.199

## 2013-06-21 HISTORY — DX: Patient's noncompliance with other medical treatment and regimen: Z91.19

## 2013-06-21 LAB — CBC WITH DIFFERENTIAL/PLATELET
Basophils Absolute: 0.1 10*3/uL (ref 0.0–0.1)
Basophils Relative: 1 % (ref 0–1)
Eosinophils Absolute: 0.1 10*3/uL (ref 0.0–0.7)
Hemoglobin: 14.9 g/dL (ref 12.0–15.0)
Lymphocytes Relative: 32 % (ref 12–46)
MCHC: 34.8 g/dL (ref 30.0–36.0)
Neutrophils Relative %: 57 % (ref 43–77)
RDW: 13.5 % (ref 11.5–15.5)

## 2013-06-21 LAB — COMPREHENSIVE METABOLIC PANEL
ALT: 6 U/L (ref 0–35)
AST: <5 U/L (ref 0–37)
Albumin: 3.6 g/dL (ref 3.5–5.2)
Calcium: 8.9 mg/dL (ref 8.4–10.5)
Sodium: 124 mEq/L — ABNORMAL LOW (ref 135–145)
Total Protein: 7.6 g/dL (ref 6.0–8.3)

## 2013-06-21 LAB — URINE MICROSCOPIC-ADD ON

## 2013-06-21 LAB — GLUCOSE, CAPILLARY: Glucose-Capillary: >600 mg/dL (ref 70–99)

## 2013-06-21 LAB — URINALYSIS, ROUTINE W REFLEX MICROSCOPIC
Bilirubin Urine: NEGATIVE
Hgb urine dipstick: NEGATIVE
Specific Gravity, Urine: 1.035 — ABNORMAL HIGH (ref 1.005–1.030)
pH: 5 (ref 5.0–8.0)

## 2013-06-21 LAB — PREGNANCY, URINE: Preg Test, Ur: NEGATIVE

## 2013-06-21 MED ORDER — MORPHINE SULFATE 4 MG/ML IJ SOLN
4.0000 mg | Freq: Once | INTRAMUSCULAR | Status: AC
  Administered 2013-06-22: 4 mg via INTRAVENOUS
  Filled 2013-06-21: qty 1

## 2013-06-21 MED ORDER — SODIUM CHLORIDE 0.9 % IV BOLUS (SEPSIS)
1000.0000 mL | Freq: Once | INTRAVENOUS | Status: AC
  Administered 2013-06-22: 1000 mL via INTRAVENOUS

## 2013-06-21 MED ORDER — DEXTROSE 50 % IV SOLN: 25.0000 mL | INTRAVENOUS | Status: DC | PRN

## 2013-06-21 MED ORDER — INSULIN REGULAR HUMAN 100 UNIT/ML IJ SOLN
INTRAMUSCULAR | Status: DC
  Administered 2013-06-22: 5.4 [IU]/h via INTRAVENOUS
  Filled 2013-06-21: qty 1

## 2013-06-21 MED ORDER — SODIUM CHLORIDE 0.9 % IV SOLN
INTRAVENOUS | Status: DC
  Administered 2013-06-22: via INTRAVENOUS

## 2013-06-21 MED ORDER — ONDANSETRON HCL 4 MG/2ML IJ SOLN
4.0000 mg | Freq: Once | INTRAMUSCULAR | Status: AC
  Administered 2013-06-22: 4 mg via INTRAVENOUS
  Filled 2013-06-21: qty 2

## 2013-06-21 MED ORDER — SODIUM CHLORIDE 0.9 % IV BOLUS (SEPSIS)
1000.0000 mL | Freq: Once | INTRAVENOUS | Status: AC
  Administered 2013-06-21: 1000 mL via INTRAVENOUS

## 2013-06-21 MED ORDER — INSULIN REGULAR BOLUS VIA INFUSION
0.0000 [IU] | Freq: Three times a day (TID) | INTRAVENOUS | Status: DC
  Filled 2013-06-21: qty 10

## 2013-06-21 MED ORDER — INSULIN REGULAR HUMAN 100 UNIT/ML IJ SOLN
INTRAMUSCULAR | Status: AC
  Filled 2013-06-21: qty 1

## 2013-06-21 MED ORDER — DEXTROSE-NACL 5-0.45 % IV SOLN: INTRAVENOUS | Status: DC

## 2013-06-21 NOTE — ED Provider Notes (Signed)
CSN: [id number]     Arrival date & time 06/21/13  2158 History  This chart was scribed for Loren Racer, MD by Dorothey Baseman, ED Scribe. This patient was seen in room MH06/MH06 and the patient's care was started at 11:32 PM.    Chief Complaint  Patient presents with  . Hyperglycemia   Patient is a 22 y.o. female presenting with hyperglycemia. The history is provided by the patient. No language interpreter was used.  Hyperglycemia Severity:  Moderate Duration: 2 weeks. Timing:  Constant Chronicity:  New Diabetes status:  Controlled with insulin Current diabetic therapy:  Novalog and Lantus Time since last antidiabetic medication: 3 weeks ago. Relieved by:  None tried Ineffective treatments:  None tried Associated symptoms: abdominal pain, dysuria, fatigue, nausea and vomiting   Associated symptoms: no dizziness and no fever    HPI Comments: Julie [name] is a 22 y.o. female with a history of DM who presents to the Emergency Department complaining of hyperglycemia with associated severe headache and back pain onset 2 weeks ago that has been progressively worsening. She reports associated nausea, emesis, and dysuria. She reports periumbilical abdominal pain 2 days ago, but states that it has improved. She states that she has been handling fluids well. She denies fever, rhinorrhea, cough. She reports that she ran out of her insulin 3 weeks ago and that her Medicaid will no longer cover it. Patient has been admitted multiple times in the past for DKA.    Past Medical History  Diagnosis Date  . Diabetes mellitus without complication    Past Surgical History  Procedure Laterality Date  . Cesarean section    . Cesarean section  2012   Family History  Problem Relation Age of Onset  . Diabetes Mother    History  Substance Use Topics  . Smoking status: Never Smoker   . Smokeless tobacco: Not on file  . Alcohol Use: No   OB History   Grav Para Term Preterm Abortions TAB  SAB Ect Mult Living                 Review of Systems  Constitutional: Positive for fatigue. Negative for fever and chills.  HENT: Negative for congestion, sore throat, rhinorrhea and sinus pressure.   Respiratory: Negative for cough.   Gastrointestinal: Positive for nausea, vomiting and abdominal pain. Negative for diarrhea and rectal pain.  Genitourinary: Positive for dysuria and flank pain. Negative for hematuria, vaginal bleeding, vaginal discharge and pelvic pain.  Musculoskeletal: Positive for myalgias and back pain.  Skin: Negative for rash and wound.  Neurological: Positive for headaches. Negative for dizziness, weakness and numbness.  All other systems reviewed and are negative.    Allergies  Review of patient's allergies indicates no known allergies.  Home Medications   Current Outpatient Rx  Name  Route  Sig  Dispense  Refill  . diphenhydrAMINE (BENADRYL) 25 mg capsule   Oral   Take 25 mg by mouth daily as needed for sleep.         Marland Kitchen FLUoxetine (PROZAC) 10 MG capsule   Oral   Take 1 capsule (10 mg total) by mouth daily.   30 capsule   3   . ibuprofen (ADVIL,MOTRIN) 200 MG tablet   Oral   Take 400 mg by mouth daily as needed for pain.         Marland Kitchen insulin aspart (NOVOLOG) 100 UNIT/ML injection   Subcutaneous   Inject 10 Units into the skin 3 (three)  times daily with meals.   1 vial   12   . insulin glargine (LANTUS) 100 UNIT/ML injection   Subcutaneous   Inject 0.55 mLs (55 Units total) into the skin at bedtime.   10 mL   12    Triage Vitals: BP 117/73  Pulse 91  Temp(Src) 98.9 F (37.2 C) (Oral)  Resp 16  Ht 5\' 10"  (1.778 m)  Wt 140 lb (63.504 kg)  BMI 20.09 kg/m2  SpO2 100%  LMP 06/07/2013  Physical Exam  Nursing note and vitals reviewed. Constitutional: She is oriented to person, place, and time. She appears well-developed and well-nourished.  HENT:  Head: Normocephalic and atraumatic.  Mouth/Throat: Oropharynx is clear and moist.   Tenderness with percussion to the right maxillary sinuses. Mildly edematous nasal mucosa  Eyes: Conjunctivae and EOM are normal. Pupils are equal, round, and reactive to light.  Neck: Normal range of motion. Neck supple.  No meningismus  Cardiovascular: Normal rate and regular rhythm.   Pulmonary/Chest: Effort normal and breath sounds normal. No respiratory distress. She has no wheezes. She has no rales.  Abdominal: Soft. Bowel sounds are normal. She exhibits no distension and no mass. There is no tenderness. There is no rebound and no guarding.  Musculoskeletal: Normal range of motion. She exhibits no edema and no tenderness.  Right-sided CVA tenderness  Neurological: She is alert and oriented to person, place, and time.  Patient is alert and oriented x3 with clear, goal oriented speech. Patient has 5/5 motor in all extremities. Sensation is intact to light touch.   Skin: Skin is warm and dry. No rash noted. No erythema.  Psychiatric: She has a normal mood and affect. Her behavior is normal.    ED Course  Procedures (including critical care time)  Medications  dextrose 5 %-0.45 % sodium chloride infusion (not administered)  insulin regular bolus via infusion 0-10 Units (not administered)  insulin regular (NOVOLIN R,HUMULIN R) 1 Units/mL in sodium chloride 0.9 % 100 mL infusion (5.4 Units/hr Intravenous New Bag/Given 06/22/13 0007)  dextrose 50 % solution 25 mL (not administered)  0.9 %  sodium chloride infusion ( Intravenous New Bag/Given 06/22/13 0008)  ondansetron (ZOFRAN) injection 4 mg (not administered)  sodium chloride 0.9 % bolus 1,000 mL (1,000 mLs Intravenous New Bag/Given 06/21/13 2229)  sodium chloride 0.9 % bolus 1,000 mL (0 mLs Intravenous Stopped 06/22/13 0011)  morphine 4 MG/ML injection 4 mg (4 mg Intravenous Given 06/22/13 0004)  ondansetron (ZOFRAN) injection 4 mg (4 mg Intravenous Given 06/22/13 0005)  insulin regular (NOVOLIN R,HUMULIN R) 100 units/mL injection (   Duplicate 06/22/13 0010)    DIAGNOSTIC STUDIES: Oxygen Saturation is 100% on room air, normal by my interpretation.    COORDINATION OF CARE: 11:36PM- Discussed that symptoms are likely due to DKA and that she will likely be admitted to the hospital. Discussed treatment plan with patient at bedside and patient verbalized agreement.    Labs Review Labs Reviewed  COMPREHENSIVE METABOLIC PANEL - Abnormal; Notable for the following:    Sodium 124 (*)    Chloride 86 (*)    CO2 13 (*)    Glucose, Bld 717 (*)    Creatinine, Ser 0.30 (*)    Total Bilirubin 0.1 (*)    All other components within normal limits  URINALYSIS, ROUTINE W REFLEX MICROSCOPIC - Abnormal; Notable for the following:    Specific Gravity, Urine 1.035 (*)    Glucose, UA >1000 (*)    Ketones, ur >80 (*)  All other components within normal limits  GLUCOSE, CAPILLARY - Abnormal; Notable for the following:    Glucose-Capillary >600 (*)    All other components within normal limits  CBC WITH DIFFERENTIAL  PREGNANCY, URINE  URINE MICROSCOPIC-ADD ON   Imaging Review No results found.  MDM  I personally performed the services described in this documentation, which was scribed in my presence. The recorded information has been reviewed and is accurate.  Discussed with Dr. Conley Rolls. Will accept the patient in transfer for DKA. Patient has a right-sided headache without any concerning signs of meningitis or intracranial bleeding. Headache is likely compounded by her dehydration. Her urine does not show any signs of infection. We'll treat with IV fluids and IV insulin. Signs remained stable in the emergency department.   Loren Racer, MD 06/22/13 [phone]

## 2013-06-21 NOTE — ED Notes (Addendum)
Pt c/o increased bs at home meter read "high" pt has been out of insulin x 3 weeks, triage cbg " high"

## 2013-06-22 ENCOUNTER — Inpatient Hospital Stay (HOSPITAL_COMMUNITY): Payer: Medicaid Other

## 2013-06-22 ENCOUNTER — Encounter (HOSPITAL_COMMUNITY): Payer: Self-pay | Admitting: Internal Medicine

## 2013-06-22 ENCOUNTER — Ambulatory Visit (HOSPITAL_COMMUNITY): Payer: Medicaid Other

## 2013-06-22 DIAGNOSIS — E1010 Type 1 diabetes mellitus with ketoacidosis without coma: Principal | ICD-10-CM

## 2013-06-22 DIAGNOSIS — F418 Other specified anxiety disorders: Secondary | ICD-10-CM | POA: Diagnosis present

## 2013-06-22 DIAGNOSIS — R51 Headache: Secondary | ICD-10-CM

## 2013-06-22 DIAGNOSIS — F329 Major depressive disorder, single episode, unspecified: Secondary | ICD-10-CM | POA: Diagnosis present

## 2013-06-22 DIAGNOSIS — R519 Headache, unspecified: Secondary | ICD-10-CM | POA: Diagnosis present

## 2013-06-22 DIAGNOSIS — E860 Dehydration: Secondary | ICD-10-CM

## 2013-06-22 LAB — BASIC METABOLIC PANEL
BUN: 11 mg/dL (ref 6–23)
BUN: 9 mg/dL (ref 6–23)
CO2: 13 mEq/L — ABNORMAL LOW (ref 19–32)
CO2: 15 mEq/L — ABNORMAL LOW (ref 19–32)
Calcium: 7.5 mg/dL — ABNORMAL LOW (ref 8.4–10.5)
Chloride: 100 mEq/L (ref 96–112)
Chloride: 102 mEq/L (ref 96–112)
Chloride: 104 mEq/L (ref 96–112)
Chloride: 100 mEq/L (ref 96–112)
Creatinine, Ser: 0.25 mg/dL — ABNORMAL LOW (ref 0.50–1.10)
GFR calc Af Amer: >90 mL/min (ref >90)
GFR calc Af Amer: >90 mL/min (ref >90)
GFR calc non Af Amer: >90 mL/min (ref >90)
GFR calc non Af Amer: >90 mL/min (ref >90)
Glucose, Bld: 218 mg/dL — ABNORMAL HIGH (ref 70–99)
Potassium: 3.2 mEq/L — ABNORMAL LOW (ref 3.5–5.1)
Potassium: 3.1 mEq/L — ABNORMAL LOW (ref 3.5–5.1)
Potassium: 3.6 mEq/L (ref 3.5–5.1)
Potassium: 3.5 mEq/L (ref 3.5–5.1)
Potassium: 3.3 mEq/L — ABNORMAL LOW (ref 3.5–5.1)
Sodium: 132 mEq/L — ABNORMAL LOW (ref 135–145)
Sodium: 134 mEq/L — ABNORMAL LOW (ref 135–145)
Sodium: 133 mEq/L — ABNORMAL LOW (ref 135–145)
Sodium: 131 mEq/L — ABNORMAL LOW (ref 135–145)
Sodium: 130 mEq/L — ABNORMAL LOW (ref 135–145)

## 2013-06-22 LAB — GLUCOSE, CAPILLARY
Glucose-Capillary: 365 mg/dL — ABNORMAL HIGH (ref 70–99)
Glucose-Capillary: 203 mg/dL — ABNORMAL HIGH (ref 70–99)
Glucose-Capillary: 197 mg/dL — ABNORMAL HIGH (ref 70–99)
Glucose-Capillary: 137 mg/dL — ABNORMAL HIGH (ref 70–99)
Glucose-Capillary: 206 mg/dL — ABNORMAL HIGH (ref 70–99)
Glucose-Capillary: 204 mg/dL — ABNORMAL HIGH (ref 70–99)
Glucose-Capillary: 162 mg/dL — ABNORMAL HIGH (ref 70–99)
Glucose-Capillary: 171 mg/dL — ABNORMAL HIGH (ref 70–99)
Glucose-Capillary: 163 mg/dL — ABNORMAL HIGH (ref 70–99)
Glucose-Capillary: 193 mg/dL — ABNORMAL HIGH (ref 70–99)
Glucose-Capillary: 212 mg/dL — ABNORMAL HIGH (ref 70–99)

## 2013-06-22 LAB — CBC
HCT: 35 % — ABNORMAL LOW (ref 36.0–46.0)
Hemoglobin: 12.6 g/dL (ref 12.0–15.0)
MCH: 30.4 pg (ref 26.0–34.0)
MCHC: 36 g/dL (ref 30.0–36.0)
MCV: 84.5 fL (ref 78.0–100.0)
Platelets: 215 10*3/uL (ref 150–400)
RBC: 4.14 MIL/uL (ref 3.87–5.11)
WBC: 8 10*3/uL (ref 4.0–10.5)

## 2013-06-22 MED ORDER — MORPHINE SULFATE 4 MG/ML IJ SOLN
4.0000 mg | Freq: Once | INTRAMUSCULAR | Status: AC
  Administered 2013-06-22: 4 mg via INTRAVENOUS

## 2013-06-22 MED ORDER — POTASSIUM CHLORIDE 2 MEQ/ML IV SOLN
INTRAVENOUS | Status: DC
  Administered 2013-06-22 – 2013-06-23 (×4): via INTRAVENOUS
  Filled 2013-06-22 (×6): qty 1000

## 2013-06-22 MED ORDER — SODIUM CHLORIDE 0.9 % IV SOLN: INTRAVENOUS | Status: DC

## 2013-06-22 MED ORDER — DEXTROSE 50 % IV SOLN: 25.0000 mL | INTRAVENOUS | Status: DC | PRN

## 2013-06-22 MED ORDER — ZOLPIDEM TARTRATE 5 MG PO TABS
5.0000 mg | ORAL_TABLET | Freq: Every evening | ORAL | Status: DC | PRN
  Administered 2013-06-22 – 2013-06-24 (×4): 5 mg via ORAL
  Filled 2013-06-22 (×4): qty 1

## 2013-06-22 MED ORDER — INSULIN GLARGINE 100 UNIT/ML ~~LOC~~ SOLN
25.0000 [IU] | Freq: Every day | SUBCUTANEOUS | Status: DC
  Administered 2013-06-22: 25 [IU] via SUBCUTANEOUS
  Filled 2013-06-22 (×2): qty 0.25

## 2013-06-22 MED ORDER — MORPHINE SULFATE 4 MG/ML IJ SOLN
INTRAMUSCULAR | Status: AC
  Filled 2013-06-22: qty 1

## 2013-06-22 MED ORDER — MUSCLE RUB 10-15 % EX CREA
TOPICAL_CREAM | Freq: Two times a day (BID) | CUTANEOUS | Status: DC | PRN
  Filled 2013-06-22: qty 85

## 2013-06-22 MED ORDER — INSULIN ASPART 100 UNIT/ML ~~LOC~~ SOLN
0.0000 [IU] | SUBCUTANEOUS | Status: DC
  Administered 2013-06-22 – 2013-06-23 (×4): 5 [IU] via SUBCUTANEOUS
  Administered 2013-06-23: 8 [IU] via SUBCUTANEOUS
  Administered 2013-06-23: 11 [IU] via SUBCUTANEOUS
  Administered 2013-06-23: 3 [IU] via SUBCUTANEOUS
  Administered 2013-06-23: 15 [IU] via SUBCUTANEOUS
  Administered 2013-06-24 (×2): 5 [IU] via SUBCUTANEOUS
  Administered 2013-06-24: 2 [IU] via SUBCUTANEOUS
  Administered 2013-06-24: 15 [IU] via SUBCUTANEOUS
  Administered 2013-06-24: 11 [IU] via SUBCUTANEOUS
  Administered 2013-06-25: 8 [IU] via SUBCUTANEOUS
  Administered 2013-06-25 (×2): 3 [IU] via SUBCUTANEOUS
  Administered 2013-06-25: 11 [IU] via SUBCUTANEOUS

## 2013-06-22 MED ORDER — ONDANSETRON HCL 4 MG/2ML IJ SOLN: 4.0000 mg | Freq: Three times a day (TID) | INTRAMUSCULAR | Status: AC | PRN

## 2013-06-22 MED ORDER — INSULIN GLARGINE 100 UNIT/ML ~~LOC~~ SOLN: 10.0000 [IU] | Freq: Every day | SUBCUTANEOUS | Status: DC

## 2013-06-22 MED ORDER — TROLAMINE SALICYLATE 10 % EX CREA: TOPICAL_CREAM | Freq: Two times a day (BID) | CUTANEOUS | Status: DC | PRN

## 2013-06-22 MED ORDER — DEXTROSE-NACL 5-0.45 % IV SOLN
INTRAVENOUS | Status: DC
  Administered 2013-06-22: 125 mL/h via INTRAVENOUS

## 2013-06-22 MED ORDER — SODIUM CHLORIDE 0.9 % IV SOLN
INTRAVENOUS | Status: AC
  Administered 2013-06-22: 03:00:00 via INTRAVENOUS

## 2013-06-22 MED ORDER — FLUOXETINE HCL 10 MG PO CAPS
10.0000 mg | ORAL_CAPSULE | Freq: Every day | ORAL | Status: DC
  Administered 2013-06-22: 10 mg via ORAL
  Filled 2013-06-22: qty 1

## 2013-06-22 MED ORDER — POTASSIUM CHLORIDE 10 MEQ/100ML IV SOLN
10.0000 meq | INTRAVENOUS | Status: AC
  Administered 2013-06-22 (×2): 10 meq via INTRAVENOUS
  Filled 2013-06-22 (×2): qty 100

## 2013-06-22 MED ORDER — PROMETHAZINE HCL 25 MG/ML IJ SOLN
12.5000 mg | Freq: Four times a day (QID) | INTRAMUSCULAR | Status: DC | PRN
  Administered 2013-06-22: 12.5 mg via INTRAVENOUS
  Filled 2013-06-22: qty 1

## 2013-06-22 MED ORDER — ONDANSETRON HCL 4 MG/2ML IJ SOLN
4.0000 mg | Freq: Four times a day (QID) | INTRAMUSCULAR | Status: DC | PRN
  Administered 2013-06-22 – 2013-06-23 (×2): 4 mg via INTRAVENOUS
  Filled 2013-06-22 (×2): qty 2

## 2013-06-22 MED ORDER — INFLUENZA VAC SPLIT QUAD 0.5 ML IM SUSP: 0.5000 mL | INTRAMUSCULAR | Status: DC

## 2013-06-22 MED ORDER — HYDROMORPHONE HCL PF 1 MG/ML IJ SOLN
1.0000 mg | INTRAMUSCULAR | Status: DC | PRN
  Administered 2013-06-22 – 2013-06-23 (×7): 1 mg via INTRAVENOUS
  Filled 2013-06-22 (×7): qty 1

## 2013-06-22 NOTE — Progress Notes (Signed)
PENDING ACCEPTANCE TRANFER NOTE:  Call received from:   Dr Ranae Palms.  REASON FOR REQUESTING TRANSFER:  Further Tx of DKA.  HPI:  22 yo with DKA, Bicarb 13, vitals stable.  BS 700's nausea, abdominal pain.  No infection.   PLAN:  According to telephone report, this patient was accepted for transfer to telemetry ,   Under [facility] team:  10,  I have requested an order be written to call Flow Manager at [phone] upon patient arrival to the floor for final physician assignment who will do the admission and give admitting orders.  SIGNEDHouston Siren, MD [facility]  06/22/2013, 12:26 AM

## 2013-06-22 NOTE — Progress Notes (Signed)
UR COMPLETED  

## 2013-06-22 NOTE — Progress Notes (Signed)
Pt admitted to the unit. Pt is alert and oriented. Pt oriented to room, staff, and call bell. Bed in lowest position. Full assessment to Epic. Call bell with in reach. Told to call for assists. Will continue to monitor.  Julie [name]  

## 2013-06-22 NOTE — Progress Notes (Signed)
Inpatient Diabetes Program Recommendations  AACE/ADA: New Consensus Statement on Inpatient Glycemic Control (2013)  Target Ranges:  Prepandial:   less than 140 mg/dL      Peak postprandial:   less than 180 mg/dL (1-2 hours)      Critically ill patients:  140 - 180 mg/dL   Reason for Visit: DKA  22 y.o. female with type 1 diabetes with frequent DKA's, last admission for DKA July 2014, presents to the [facility] with abdominal pain and pain nausea and vomiting and found to be in DKA with bicarbonate of 13. Further evaluation included a negative pregnancy test, negative UA for infection , blood glucose greater than 700's mg/L , creatinine of 0.3mg /dL and potassium of 4 mEq/L . Patient is alert and oriented and able to give a good history. There has been no fever, chills, cough, dysuria. She hadn't been taking her insulin for 3 weeks because of cost constraint. She also had bitemporal HA for 3 weeks. No Hx of migraine, no nasal discharge or facial pain. Hospitalist was asked to admit her for DKA.    **Patient well known to the Inpatient DM Program. Has been  counseled extensively by the DM Coordinator during previous hospital visits. Per records, patient was supposed to follow up with the [facility] on June 18th. When asked ifshe kept that appointment,  patient saidshe could not go to that appointment b/c she had to work. Patient did not attempt to reschedule appointment. Upon further investigation, patient said she has scheduled an appointment at the [facility] Department for July 28th.  Results for MAJESTA, LEICHTER (MRN [id number]) as of 06/22/2013 16:42  Ref. Range 06/22/2013 13:05  Sodium Latest Range: 135-145 mEq/L 131 (L)  Potassium Latest Range: 3.5-5.1 mEq/L 3.5  Chloride Latest Range: 96-112 mEq/L 103  CO2 Latest Range: 19-32 mEq/L 19  BUN Latest Range: 6-23 mg/dL 7  Creatinine Latest Range: 0.50-1.10 mg/dL 4.09 (L)  Calcium Latest Range:  8.4-10.5 mg/dL 7.5 (L)  GFR calc non Af Amer Latest Range: >90 mL/min >90  GFR calc Af Amer Latest Range: >90 mL/min >90  Glucose Latest Range: 70-99 mg/dL 811 (H)  Results for SHUNTAE, HERZIG (MRN [id number]) as of 06/22/2013 16:42  Ref. Range 04/02/2013 16:04  Hemoglobin A1C Latest Range: <5.7 % 12.7 (H)  Results for MOMO, BRAUN (MRN [id number]) as of 06/22/2013 16:42  Ref. Range 06/22/2013 07:39 06/22/2013 08:48 06/22/2013 10:07 06/22/2013 12:03 06/22/2013 13:11 06/22/2013 14:27 06/22/2013 15:33  Glucose-Capillary Latest Range: 70-99 mg/dL 469 (H) 629 (H) 528 (H) 162 (H) 171 (H) 163 (H) 193 (H)   AG is now 9. Gap is closed. When transitioning off insulin drip, give basal insulin 2 hours prior to discontinuation of drip.  Give correction Novolog when GlucoStabilizer is discontinued. Recommendations: Lantus 40 units QHS Novolog sensitive tidwc and hs Novolog 6 units tidwc for meal coverage insulin **Pt needs to f/u with Dr. Mayford Knife (PCP) within 1-2 weeks after discharge. Case management consult. Pt states she has Medicaid, but when she tries to refill insulin prescription, it is not covered.  Has called Medicaid and they state she should be covered. If pt unable to get Lantus and Novolog for $3 copay, will need prescription for Novolin 70/30 insulin (30 units bid) and Regular insulin (sensitive sliding scale)  Discussed with RN. Will continue to follow. Thank you.  Ailene Ards, RD, LDN, CDE Inpatient Diabetes Coordinator [phone]

## 2013-06-22 NOTE — Progress Notes (Addendum)
[facility] PROGRESS NOTE  Julie [name] WUJ:[id number] DOB: 1991/02/04 DOA: 06/21/2013 PCP: Pcp Not In System  HPI/Subjective: 22 yo HF with T1DM was admitted to [facility] for DKA.  Of recent she has had multiple admissions for DKA, with the most recent in July 2014.  On admission she c/o nausea, vomiting, abdominal pain, right sided back pain, and headaches.  Labs revealed a bicarbonate of 13 and blood glucose 717.  She admits to noncompliance with home insulin due to cost constraints.  She is very tearful today and has multiple complaints of right-sided head, neck, and back pain.  Upon further discussion she spoke of how stressed she is working two jobs to care for her daughter, brother, and mother.  She reports sleeping only 2-3 hrs per night over the last two months.  She also reports battling anorexia/bulemia since Feb 2014.  Assessment/Plan:  DKA: - Secondary to home insulin non-compliance - D5/0.45%NS with K+ infusion - Continue insulin drip - Strict I&O; Clear liquid diet, may advance as tolerated. - Will monitor labs closely and watch for decreasing acidosis and anion gap - Supportive care for nausea, low BG prn  Hypokalemia: - K+ declined to 3.1 with initiation of insulin drip - IV K+; increase K+ to 3.6 - Continue IV D5/0.45%NS with K+ - Will continue to closely monitor  Dehydration: - Secondary to DKA - IVF - BUN/Cr improving - I&O  - Will continue to monitor  Headache: - Likely tension-type; musculature on R-side neck tender to touch - Increased stress and lack of sleep - CT Head negative - Pain medication prn  Right Flank Pain: - Seems muscular in nature - UA negative - US abdomen negative - Dilaudid relieved pain - Will monitor and give pain medication prn  Depressed Mood: - Very tearful and mentioned feeling sad and very stressed out due to work and family needs. - Mentioned issues with anorexia/bulemia since February.   States she was bigger and lost weight, but now she is "terrified to get fat again." - Will monitor for side effects and effectiveness -Will request psychiatric social worker to see for recommendations and outpatient resources.   Code Status: Full Family Communication: None at bedside  Disposition Plan: Remain inpatient   Consultants:  None  Procedures:  None  Antibiotics:  None      Objective: Filed Vitals:   06/22/13 0900  BP: 89/54  Pulse: 73  Temp: 98.7 F (37.1 C)  Resp: 18    Intake/Output Summary (Last 24 hours) at 06/22/13 1219 Last data filed at 06/22/13 0900  Gross per 24 hour  Intake      0 ml  Output      0 ml  Net      0 ml   Filed Weights   06/21/13 2206 06/22/13 0206  Weight: 63.504 kg (140 lb) 62.8 kg (138 lb 7.2 oz)    Exam:   General:  Resting in bed and in no apparent distress  Cardiovascular: RRR, no murmurs, gallops, or rubs.  Distal pulses intact and symmetrical.  No peripheral edema.  Cap refill < 3 secs.  Respiratory: Clear to auscultation bilaterally.  No rales, rhonchi, or wheezes.  Abdomen: Soft.  Non-tender, non-distended.  No masses or hernias noted.  No organomegaly.  Bowel sounds heard in all 4 quadrants.  Musculoskeletal: Full ROM.  Strength 4/5 in all extremities, but minimal effort given.    Skin: No bruises, rashes, or open sores noted.  Psychiatric:  Very tearful.  Alert and oriented x 3.  Responds appropriately.  Good historian. Cooperative.   Data Reviewed: Basic Metabolic Panel:  Recent Labs Lab 06/21/13 2225 06/22/13 0345 06/22/13 0520 06/22/13 0915  NA 124* 132* 134* 133*  K 4.0 3.2* 3.1* 3.6  CL 86* 100 102 104  CO2 13* 14* 13* 15*  GLUCOSE 717* 223* 181* 218*  BUN 20 11 10 9   CREATININE 0.30* 0.25* 0.25* 0.23*  CALCIUM 8.9 7.5* 7.1* 7.4*   Liver Function Tests:  Recent Labs Lab 06/21/13 2225  AST <5  ALT 6  ALKPHOS 107  BILITOT 0.1*  PROT 7.6  ALBUMIN 3.6   CBC:  Recent  Labs Lab 06/21/13 2225 06/22/13 0345  WBC 7.4 8.0  NEUTROABS 4.2  --   HGB 14.9 12.6  HCT 42.8 35.0*  MCV 88.1 84.5  PLT 241 215   CBG:  Recent Labs Lab 06/22/13 0634 06/22/13 0739 06/22/13 0848 06/22/13 1007 06/22/13 1203  GLUCAP 137* 206* 204* 239* 162*     Studies: Ct Head Wo Contrast  06/22/2013   CLINICAL DATA:  Diabetic ketoacidosis with right-sided headache.  EXAM: CT HEAD WITHOUT CONTRAST  TECHNIQUE: Contiguous axial images were obtained from the base of the skull through the vertex without contrast.  COMPARISON:  None  FINDINGS: Normal appearance of the intracranial structures. No evidence for acute hemorrhage, mass lesion, midline shift, hydrocephalus or large infarct. No acute bony abnormality. The visualized right maxillary sinus is opacified.  IMPRESSION: No acute intracranial abnormality.  Right maxillary sinus disease which is incompletely evaluated.   Electronically Signed   By: Richarda Overlie M.D.   On: 06/22/2013 08:24   US Abdomen Complete  06/22/2013   CLINICAL DATA:  Right flank pain  EXAM: ULTRASOUND ABDOMEN COMPLETE  COMPARISON:  None.  FINDINGS: Gallbladder  There is no evidence for gallstones, gallbladder wall thickening, or pericholecystic fluid. The sonographer reports no sonographic Murphy sign.  Common bile duct  Diameter: Nondilated at 4 mm diameter.  Liver  No focal lesion identified. Within normal limits in parenchymal echogenicity.  IVC  No abnormality visualized.  Pancreas  Visualized portion unremarkable.  Spleen  Size and appearance within normal limits.  Right Kidney  Length: 13.7 Echogenicity within normal limits. No mass or hydronephrosis visualized.  Left Kidney  Length: 12.6 Echogenicity within normal limits. No mass or hydronephrosis visualized.  Abdominal aorta  No aneurysm visualized.  IMPRESSION: Unremarkable abdominal ultrasound. Specifically, no findings to explain the patient's history of right flank pain.   Electronically Signed   By: Kennith Center M.D.   On: 06/22/2013 09:30    Scheduled Meds: . insulin regular  0-10 Units Intravenous TID WC   Continuous Infusions: . dextrose 5 % and 0.45% NaCl 1,000 mL with potassium chloride 40 mEq infusion 125 mL/hr at 06/22/13 1016  . insulin (NOVOLIN-R) infusion 3.1 Units/hr (06/22/13 1212)    Active Problems:   DKA, type 1   Rt flank pain   H/O medication noncompliance   Dehydration   Headache(784.0)   Depressive disorder, not elsewhere classified     Joycelyn Man, PA-S  Algis Downs, PA-C [facility] Pager [phone]. If 7PM-7AM, please contact night-coverage at [email or web address], password [facility] 06/22/2013, 12:19 PM  LOS: 1 day     Addendum  Patient seen and examined, chart and data base reviewed.  I agree with the above assessment and plan.  For full details please see Mrs. Algis Downs PA note.   Lillyn Wieczorek A  Arthor Captain, MD [facility] Pager: [phone] 06/22/2013, 1:05 PM

## 2013-06-22 NOTE — H&P (Signed)
[facility] History and Physical  [name]:[id number] DOB: 1991/06/30    PCP:   Pcp Not In System   Chief Complaint: abdominal cramps, nausea and vomiting.  HPI: Julie [name] is an 22 y.o. female  with type 1 diabetes with frequent DKA's,  last admission for DKA  July 2014, presents to the [facility] with abdominal pain and pain nausea and vomiting and found to be in DKA with  bicarbonate of 13. Further evaluation included a negative pregnancy test, negative UA for infection , blood glucose greater than 700's mg/L , creatinine of 0.3mg /dL    and potassium of 4 mEq/L   . Patient is alert and oriented and able to give a good history. There has been no fever, chills, cough, dysuria. She hadn't been taking her insulin for 3 weeks because of cost constraint.  She also had bitemporal HA for 3 weeks.  No Hx of migraine, no nasal discharge or facial pain.  Hospitalist was asked to admit her for DKA.   Rewiew of Systems:  Constitutional: Negative for malaise, fever and chills. No significant weight loss or weight gain Eyes: Negative for eye pain, redness and discharge, diplopia, visual changes, or flashes of light. ENMT: Negative for ear pain, hoarseness, nasal congestion, sinus pressure and sore throat. no tinnitus, drooling, or problem swallowing. Cardiovascular: Negative for chest pain, palpitations, diaphoresis, dyspnea and peripheral edema. ; No orthopnea, PND Respiratory: Negative for cough, hemoptysis, wheezing and stridor. No pleuritic chestpain. Gastrointestinal: Negative for diarrhea, constipation,  melena, blood in stool, hematemesis, jaundice and rectal bleeding.    Genitourinary: Negative for frequency, dysuria, incontinence  and hematuria; Musculoskeletal: Negative for back pain and neck pain. Negative for swelling and trauma.;  Skin: . Negative for pruritus, rash, abrasions, bruising and skin lesion.; ulcerations Neuro: Negative for headache,  lightheadedness and neck stiffness. Negative for weakness, altered level of consciousness , altered mental status, extremity weakness, burning feet, involuntary movement, seizure and syncope.  Psych: negative for anxiety, depression, insomnia, tearfulness, panic attacks, hallucinations, paranoia, suicidal or homicidal ideation    Past Medical History  Diagnosis Date  . Diabetes mellitus without complication   . Noncompliance with diabetes treatment     Past Surgical History  Procedure Laterality Date  . Cesarean section    . Cesarean section  2012    Medications:  HOME MEDS: Prior to Admission medications   Medication Sig Start Date End Date Taking? Authorizing Provider  diphenhydrAMINE (BENADRYL) 25 mg capsule Take 25 mg by mouth daily as needed for sleep.    Historical Provider, MD  FLUoxetine (PROZAC) 10 MG capsule Take 1 capsule (10 mg total) by mouth daily. 04/05/13   Ripudeep Jenna Luo, MD  ibuprofen (ADVIL,MOTRIN) 200 MG tablet Take 400 mg by mouth daily as needed for pain.    Historical Provider, MD  insulin aspart (NOVOLOG) 100 UNIT/ML injection Inject 10 Units into the skin 3 (three) times daily with meals. 04/05/13   Ripudeep Jenna Luo, MD  insulin glargine (LANTUS) 100 UNIT/ML injection Inject 0.55 mLs (55 Units total) into the skin at bedtime. 04/05/13   Ripudeep Jenna Luo, MD     Allergies:  No Known Allergies  Social History:   reports that she has never smoked. She does not have any smokeless tobacco history on file. She reports that she does not drink alcohol or use illicit drugs.  Family History: Family History  Problem Relation Age of Onset  . Diabetes Mother      Physical  Exam: Filed Vitals:   06/21/13 2206 06/22/13 0105 06/22/13 0206 06/22/13 0210  BP: 117/73 105/57 98/53 96/47   Pulse: 91 76  77  Temp: 98.9 F (37.2 C)  98.7 F (37.1 C) 98.7 F (37.1 C)  TempSrc: Oral     Resp: 16   18  Height: 5\' 10"  (1.778 m)  5\' 10"  (1.778 m)   Weight: 63.504 kg (140 lb)   62.8 kg (138 lb 7.2 oz)   SpO2: 100% 99%  96%   Blood pressure 96/47, pulse 77, temperature 98.7 F (37.1 C), temperature source Oral, resp. rate 18, height 5\' 10"  (1.778 m), weight 62.8 kg (138 lb 7.2 oz), last menstrual period 06/07/2013, SpO2 96.00%.  GEN:  Pleasant  patient lying in the stretcher in no acute distress; cooperative with exam. PSYCH:  alert and oriented x4; does not appear anxious or depressed; affect is appropriate. HEENT: Mucous membranes pink and anicteric; PERRLA; EOM intact; no cervical lymphadenopathy nor thyromegaly or carotid bruit; no JVD; There were no stridor. Neck is very supple. Breasts:: Not examined CHEST WALL: No tenderness CHEST: Normal respiration, clear to auscultation bilaterally.  HEART: Regular rate and rhythm.  There are no murmur, rub, or gallops.   BACK: No kyphosis or scoliosis; no CVA tenderness ABDOMEN: soft and non-tender; no masses, no organomegaly, normal abdominal bowel sounds; no pannus; no intertriginous candida. There is no rebound and no distention. Rectal Exam: Not done EXTREMITIES: No bone or joint deformity; age-appropriate arthropathy of the hands and knees; no edema; no ulcerations.  There is no calf tenderness. Genitalia: not examined PULSES: 2+ and symmetric SKIN: Normal hydration no rash or ulceration CNS: Cranial nerves 2-12 grossly intact no focal lateralizing neurologic deficit.  Speech is fluent; uvula elevated with phonation, facial symmetry and tongue midline. DTR are normal bilaterally, cerebella exam is intact, barbinski is negative and strengths are equaled bilaterally.  No sensory loss.   Labs on Admission:  Basic Metabolic Panel:  Recent Labs Lab 06/21/13 2225  NA 124*  K 4.0  CL 86*  CO2 13*  GLUCOSE 717*  BUN 20  CREATININE 0.30*  CALCIUM 8.9   Liver Function Tests:  Recent Labs Lab 06/21/13 2225  AST <5  ALT 6  ALKPHOS 107  BILITOT 0.1*  PROT 7.6  ALBUMIN 3.6   No results found for this  basename: LIPASE, AMYLASE,  in the last 168 hours No results found for this basename: AMMONIA,  in the last 168 hours CBC:  Recent Labs Lab 06/21/13 2225  WBC 7.4  NEUTROABS 4.2  HGB 14.9  HCT 42.8  MCV 88.1  PLT 241   Cardiac Enzymes: No results found for this basename: CKTOTAL, CKMB, CKMBINDEX, TROPONINI,  in the last 168 hours  CBG:  Recent Labs Lab 06/21/13 2208 06/22/13 0103 06/22/13 0214 06/22/13 0321  GLUCAP >600* 365* 287* 203*     Radiological Exams on Admission: No results found.  Assessment/Plan Present on Admission:  . DKA, type 1 . Dehydration . Rt flank pain HA.   PLAN:  Will admit patient to the telemetry unit for DKA since she is stable.  Will give IVF along with implementing the glucose stabalizer.   Will tx with analgesics and antiemetics. There is no evidence of infection.   Home medications will be continued except the home insulin since patient is on insulin drip.  She has been noncompliant but it is because she couldn't afford her insulin.  For her HA, will scan her head since it  has been 3 weeks, but I suspect it will be negative.  For her flank pain, will obtain an abdominal US especially to exclude hydronephrosis on that side.   Patient is stable, full code, and will be admitted to [facility] service.  Other plans as per orders.  Code Status: FULL Unk Lightning, MD. [facility] Pager [phone] 7pm to 7am.  06/22/2013, 3:38 AM

## 2013-06-23 DIAGNOSIS — E1110 Type 2 diabetes mellitus with ketoacidosis without coma: Secondary | ICD-10-CM

## 2013-06-23 DIAGNOSIS — E119 Type 2 diabetes mellitus without complications: Secondary | ICD-10-CM

## 2013-06-23 DIAGNOSIS — F329 Major depressive disorder, single episode, unspecified: Secondary | ICD-10-CM

## 2013-06-23 LAB — BASIC METABOLIC PANEL
BUN: 4 mg/dL — ABNORMAL LOW (ref 6–23)
BUN: 6 mg/dL (ref 6–23)
BUN: 10 mg/dL (ref 6–23)
CO2: 23 mEq/L (ref 19–32)
CO2: 20 mEq/L (ref 19–32)
Calcium: 8.1 mg/dL — ABNORMAL LOW (ref 8.4–10.5)
Chloride: 104 mEq/L (ref 96–112)
Chloride: 105 mEq/L (ref 96–112)
Creatinine, Ser: 0.31 mg/dL — ABNORMAL LOW (ref 0.50–1.10)
Creatinine, Ser: 0.3 mg/dL — ABNORMAL LOW (ref 0.50–1.10)
Creatinine, Ser: 0.49 mg/dL — ABNORMAL LOW (ref 0.50–1.10)
Creatinine, Ser: 0.57 mg/dL (ref 0.50–1.10)
GFR calc Af Amer: >90 mL/min (ref >90)
GFR calc Af Amer: >90 mL/min (ref >90)
GFR calc Af Amer: >90 mL/min (ref >90)
GFR calc non Af Amer: >90 mL/min (ref >90)
GFR calc non Af Amer: >90 mL/min (ref >90)
GFR calc non Af Amer: >90 mL/min (ref >90)
Glucose, Bld: 396 mg/dL — ABNORMAL HIGH (ref 70–99)
Glucose, Bld: 396 mg/dL — ABNORMAL HIGH (ref 70–99)
Potassium: 3.3 mEq/L — ABNORMAL LOW (ref 3.5–5.1)
Potassium: 3.5 mEq/L (ref 3.5–5.1)
Potassium: 4 mEq/L (ref 3.5–5.1)
Sodium: 137 mEq/L (ref 135–145)
Sodium: 138 mEq/L (ref 135–145)

## 2013-06-23 LAB — GLUCOSE, CAPILLARY
Glucose-Capillary: 194 mg/dL — ABNORMAL HIGH (ref 70–99)
Glucose-Capillary: 240 mg/dL — ABNORMAL HIGH (ref 70–99)
Glucose-Capillary: 259 mg/dL — ABNORMAL HIGH (ref 70–99)
Glucose-Capillary: 300 mg/dL — ABNORMAL HIGH (ref 70–99)
Glucose-Capillary: 324 mg/dL — ABNORMAL HIGH (ref 70–99)
Glucose-Capillary: 481 mg/dL — ABNORMAL HIGH (ref 70–99)

## 2013-06-23 MED ORDER — CYCLOBENZAPRINE HCL 10 MG PO TABS
10.0000 mg | ORAL_TABLET | Freq: Three times a day (TID) | ORAL | Status: DC
  Administered 2013-06-23 – 2013-06-25 (×6): 10 mg via ORAL
  Filled 2013-06-23 (×9): qty 1

## 2013-06-23 MED ORDER — INSULIN ASPART 100 UNIT/ML ~~LOC~~ SOLN
3.0000 [IU] | Freq: Three times a day (TID) | SUBCUTANEOUS | Status: DC
  Administered 2013-06-23 – 2013-06-25 (×6): 3 [IU] via SUBCUTANEOUS

## 2013-06-23 MED ORDER — POTASSIUM CHLORIDE CRYS ER 20 MEQ PO TBCR
40.0000 meq | EXTENDED_RELEASE_TABLET | Freq: Two times a day (BID) | ORAL | Status: AC
  Administered 2013-06-23 (×2): 40 meq via ORAL
  Filled 2013-06-23 (×2): qty 2

## 2013-06-23 MED ORDER — OXYCODONE-ACETAMINOPHEN 5-325 MG PO TABS
1.0000 | ORAL_TABLET | Freq: Four times a day (QID) | ORAL | Status: DC | PRN
  Administered 2013-06-23 (×2): 2 via ORAL
  Administered 2013-06-23: 1 via ORAL
  Administered 2013-06-24 – 2013-06-25 (×4): 2 via ORAL
  Filled 2013-06-23 (×3): qty 2
  Filled 2013-06-23: qty 1
  Filled 2013-06-23 (×3): qty 2

## 2013-06-23 MED ORDER — INSULIN GLARGINE 100 UNIT/ML ~~LOC~~ SOLN
40.0000 [IU] | Freq: Every day | SUBCUTANEOUS | Status: DC
  Administered 2013-06-23 – 2013-06-24 (×2): 40 [IU] via SUBCUTANEOUS
  Filled 2013-06-23 (×3): qty 0.4

## 2013-06-23 NOTE — Progress Notes (Signed)
Pt cbg 481 MD called/made aware. Orders received.

## 2013-06-23 NOTE — Progress Notes (Signed)
[facility] PROGRESS NOTE  BERA PINELA WUJ:[id number] DOB: [birth year] DOA: 06/21/2013 PCP: Pcp Not In System  HPI/Subjective: 22 yo HF with T1DM was admitted to [facility] for DKA.  Of recent she has had multiple admissions for DKA, with the most recent in July 2014.  On admission she c/o nausea, vomiting, abdominal pain, right sided back pain, and headaches.  Labs revealed a bicarbonate of 13 and blood glucose 717.  She admits to noncompliance with home insulin due to cost constraints.  She is very tearful today and has multiple complaints of right-sided head, neck, and back pain.  Upon further discussion she spoke of how stressed she is working two jobs to care for her daughter, brother, and mother.  She reports sleeping only 2-3 hrs per night over the last two months.  She also reports battling anorexia/bulemia since Feb 2014.  Assessment/Plan:  DKA: - Resolved, off insulin drip - Started on lantus and ssi  Hypokalemia: - Resolved  Dehydration: - Secondary to DKA -BUN, Cr has improved -  Headache: - Likely tension-type; musculature on R-side neck tender to touch - Increased stress and lack of sleep - CT Head negative - Will d/c Dilaudid and start prn percocet  Right Flank Pain: - Seems muscular in nature - UA negative - US abdomen negative - Dilaudid relieved pain - Will monitor and give pain percocet prn - Will also start Flexeril 10 mg po tid  Depressed Mood: - Very tearful and mentioned feeling sad and very stressed out due to work and family needs. - Mentioned issues with anorexia/bulemia since February.  States she was bigger and lost weight, but now she is "terrified to get fat again." - Will  get psych consult   Code Status: Full Family Communication: None at bedside  Disposition Plan: Remain inpatient   Consultants:  None  Procedures:  None  Antibiotics:  None      Objective: Filed Vitals:   06/23/13 1012  BP: 87/72   Pulse: 76  Temp: 98 F (36.7 C)  Resp: 18    Intake/Output Summary (Last 24 hours) at 06/23/13 1403 Last data filed at 06/22/13 1747  Gross per 24 hour  Intake    935 ml  Output      0 ml  Net    935 ml   Filed Weights   06/21/13 2206 06/22/13 0206  Weight: 63.504 kg (140 lb) 62.8 kg (138 lb 7.2 oz)    Exam:   General: Appear in no acute distress  Cardiovascular: RRR, no murmurs, gallops, or rubs.    Respiratory: Clear to auscultation bilaterally  Abdomen: Soft.  Non-tender, non-distended.  No masses or hernias noted.    Musculoskeletal:   No edema of the lower extremities   Psychiatric:    Alert and oriented x 3.  Responds appropriately.  Flat affect  Data Reviewed: Basic Metabolic Panel:  Recent Labs Lab 06/22/13 0915 06/22/13 1305 06/22/13 1802 06/23/13 0012 06/23/13 0613  NA 133* 131* 130* 137 138  K 3.6 3.5 3.3* 3.3* 3.5  CL 104 103 100 104 105  CO2 15* 19 21 23 22   GLUCOSE 218* 174* 165* 201* 289*  BUN 9 7 6  5* 4*  CREATININE 0.23* 0.23* <0.20* 0.31* 0.30*  CALCIUM 7.4* 7.5* 7.6* 7.8* 8.0*   Liver Function Tests:  Recent Labs Lab 06/21/13 2225  AST <5  ALT 6  ALKPHOS 107  BILITOT 0.1*  PROT 7.6  ALBUMIN 3.6   CBC:  Recent Labs  Lab 06/21/13 2225 06/22/13 0345  WBC 7.4 8.0  NEUTROABS 4.2  --   HGB 14.9 12.6  HCT 42.8 35.0*  MCV 88.1 84.5  PLT 241 215   CBG:  Recent Labs Lab 06/22/13 2004 06/22/13 2357 06/23/13 0423 06/23/13 0817 06/23/13 1210  GLUCAP 246* 194* 228* 240* 300*     Studies: Ct Head Wo Contrast  06/22/2013   CLINICAL DATA:  Diabetic ketoacidosis with right-sided headache.  EXAM: CT HEAD WITHOUT CONTRAST  TECHNIQUE: Contiguous axial images were obtained from the base of the skull through the vertex without contrast.  COMPARISON:  None  FINDINGS: Normal appearance of the intracranial structures. No evidence for acute hemorrhage, mass lesion, midline shift, hydrocephalus or large infarct. No acute bony  abnormality. The visualized right maxillary sinus is opacified.  IMPRESSION: No acute intracranial abnormality.  Right maxillary sinus disease which is incompletely evaluated.   Electronically Signed   By: Richarda Overlie M.D.   On: 06/22/2013 08:24   US Abdomen Complete  06/22/2013   CLINICAL DATA:  Right flank pain  EXAM: ULTRASOUND ABDOMEN COMPLETE  COMPARISON:  None.  FINDINGS: Gallbladder  There is no evidence for gallstones, gallbladder wall thickening, or pericholecystic fluid. The sonographer reports no sonographic Murphy sign.  Common bile duct  Diameter: Nondilated at 4 mm diameter.  Liver  No focal lesion identified. Within normal limits in parenchymal echogenicity.  IVC  No abnormality visualized.  Pancreas  Visualized portion unremarkable.  Spleen  Size and appearance within normal limits.  Right Kidney  Length: 13.7 Echogenicity within normal limits. No mass or hydronephrosis visualized.  Left Kidney  Length: 12.6 Echogenicity within normal limits. No mass or hydronephrosis visualized.  Abdominal aorta  No aneurysm visualized.  IMPRESSION: Unremarkable abdominal ultrasound. Specifically, no findings to explain the patient's history of right flank pain.   Electronically Signed   By: Kennith Center M.D.   On: 06/22/2013 09:30    Scheduled Meds: . insulin aspart  0-15 Units Subcutaneous Q4H  . insulin glargine  25 Units Subcutaneous QHS  . potassium chloride  40 mEq Oral BID   Continuous Infusions: . dextrose 5 % and 0.45% NaCl 1,000 mL with potassium chloride 40 mEq infusion 125 mL/hr at 06/23/13 1208    Principal Problem:   DKA, type 1 Active Problems:   Rt flank pain   H/O medication noncompliance   Dehydration   Headache(784.0)   Depressive disorder, not elsewhere classified        Mauro Kaufmann [phone]

## 2013-06-23 NOTE — Consult Note (Signed)
Reason for Consult: Depression Referring Physician: Dr. Anise Salvo [name] is an 22 y.o. female.  HPI: Patient is seen and chart reviewed Julie [name] is an 22 y.o. Female has been struggling with symptoms of depression, anxiety, can not rest and disturbance of sleep sleep, nausea and vomiting says "I don;t want get fat", denied symptoms of mania, PTSD, psychosis and denied and suicidal ideation and homicidal ideations and stresses of two jobs, bills to pay, financial problems. 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 This patient decided to move into her mom home and reportedly she does not get along with her 2 years half old sister. She has a 59 years old half brother. Patient has 48 years old daughter who she has been attached. Reportedly patient sister has unknown emotional problems and has suicide attempts required hospitalizations. Patient was recently who was from her husband about a year ago. Patient has been suffering with diabetes over 3 years and unable to manage his mother because of financial difficulties and stated her medicaid is not paying some of her medications. Patient denied history of substance abuse and previous psychiatric treatment.  Mental Status Examination: Patient appeared as per his stated age, fairly groomed, and maintaining good eye contact. Patient has depressed and anxious mood and his affect was congruent with mood. He has normal rate, rhythm, and no volume of speech. His thought process is linear and goal directed. Patient has denied suicidal, homicidal ideations, intentions or plans. Patient has no evidence of auditory or visual hallucinations, delusions, and paranoia. Patient has fair insight judgment and impulse control.  Past Medical History  Diagnosis Date  . Diabetes mellitus without complication   . Noncompliance with  diabetes treatment     Past Surgical History  Procedure Laterality Date  . Cesarean section    . Cesarean section  2012    Family History  Problem Relation Age of Onset  . Diabetes Mother     Social History:  reports that she has never smoked. She does not have any smokeless tobacco history on file. She reports that she does not drink alcohol or use illicit drugs.  Allergies: No Known Allergies  Medications: I have reviewed the patient's current medications.  Results for orders placed during the hospital encounter of 06/21/13 (from the past 48 hour(s))  GLUCOSE, CAPILLARY     Status: Abnormal   Collection Time    06/21/13 10:08 PM      Result Value Range   Glucose-Capillary >600 (*) 70 - 99 mg/dL  URINALYSIS, ROUTINE W REFLEX MICROSCOPIC     Status: Abnormal   Collection Time    06/21/13 10:10 PM      Result Value Range   Color, Urine YELLOW  YELLOW   APPearance CLEAR  CLEAR   Specific Gravity, Urine 1.035 (*) 1.005 - 1.030   pH 5.0  5.0 - 8.0   Glucose, UA >1000 (*) NEGATIVE mg/dL   Hgb urine dipstick NEGATIVE  NEGATIVE   Bilirubin Urine NEGATIVE  NEGATIVE   Ketones, ur >80 (*) NEGATIVE mg/dL   Protein, ur NEGATIVE  NEGATIVE mg/dL   Urobilinogen, UA 0.2  0.0 - 1.0 mg/dL   Nitrite NEGATIVE  NEGATIVE   Leukocytes, UA NEGATIVE  NEGATIVE  PREGNANCY, URINE     Status: None   Collection Time    06/21/13 10:10 PM  Result Value Range   Preg Test, Ur NEGATIVE  NEGATIVE   Comment:            THE SENSITIVITY OF THIS     METHODOLOGY IS >20 mIU/mL.  URINE MICROSCOPIC-ADD ON     Status: None   Collection Time    06/21/13 10:10 PM      Result Value Range   Squamous Epithelial / LPF RARE  RARE   WBC, UA 0-2  <3 WBC/hpf   RBC / HPF 0-2  <3 RBC/hpf  CBC WITH DIFFERENTIAL     Status: None   Collection Time    06/21/13 10:25 PM      Result Value Range   WBC 7.4  4.0 - 10.5 K/uL   RBC 4.86  3.87 - 5.11 MIL/uL   Hemoglobin 14.9  12.0 - 15.0 g/dL   HCT 16.1  09.6 - 04.5  %   MCV 88.1  78.0 - 100.0 fL   MCH 30.7  26.0 - 34.0 pg   MCHC 34.8  30.0 - 36.0 g/dL   RDW 40.9  81.1 - 91.4 %   Platelets 241  150 - 400 K/uL   Neutrophils Relative % 57  43 - 77 %   Lymphocytes Relative 32  12 - 46 %   Monocytes Relative 8  3 - 12 %   Eosinophils Relative 2  0 - 5 %   Basophils Relative 1  0 - 1 %   Neutro Abs 4.2  1.7 - 7.7 K/uL   Lymphs Abs 2.4  0.7 - 4.0 K/uL   Monocytes Absolute 0.6  0.1 - 1.0 K/uL   Eosinophils Absolute 0.1  0.0 - 0.7 K/uL   Basophils Absolute 0.1  0.0 - 0.1 K/uL   WBC Morphology MILD LEFT SHIFT (1-5% METAS, OCC MYELO, OCC BANDS)     Smear Review LARGE PLATELETS PRESENT    COMPREHENSIVE METABOLIC PANEL     Status: Abnormal   Collection Time    06/21/13 10:25 PM      Result Value Range   Sodium 124 (*) 135 - 145 mEq/L   Potassium 4.0  3.5 - 5.1 mEq/L   Chloride 86 (*) 96 - 112 mEq/L   CO2 13 (*) 19 - 32 mEq/L   Glucose, Bld 717 (*) 70 - 99 mg/dL   Comment: CRITICAL RESULT CALLED TO, READ BACK BY AND VERIFIED WITH:     COTHREN,J AT 2308 ON [id number] BY CHERESNOWSKY,T   BUN 20  6 - 23 mg/dL   Creatinine, Ser 7.82 (*) 0.50 - 1.10 mg/dL   Calcium 8.9  8.4 - 95.6 mg/dL   Total Protein 7.6  6.0 - 8.3 g/dL   Albumin 3.6  3.5 - 5.2 g/dL   AST <5  0 - 37 U/L   ALT 6  0 - 35 U/L   Alkaline Phosphatase 107  39 - 117 U/L   Total Bilirubin 0.1 (*) 0.3 - 1.2 mg/dL   GFR calc non Af Amer >90  >90 mL/min   GFR calc Af Amer >90  >90 mL/min   Comment: (NOTE)     The eGFR has been calculated using the CKD EPI equation.     This calculation has not been validated in all clinical situations.     eGFR's persistently <90 mL/min signify possible Chronic Kidney     Disease.  GLUCOSE, CAPILLARY     Status: Abnormal   Collection Time    06/22/13  1:03 AM  Result Value Range   Glucose-Capillary 365 (*) 70 - 99 mg/dL  GLUCOSE, CAPILLARY     Status: Abnormal   Collection Time    06/22/13  2:14 AM      Result Value Range   Glucose-Capillary 287 (*) 70  - 99 mg/dL  GLUCOSE, CAPILLARY     Status: Abnormal   Collection Time    06/22/13  3:21 AM      Result Value Range   Glucose-Capillary 203 (*) 70 - 99 mg/dL  BASIC METABOLIC PANEL     Status: Abnormal   Collection Time    06/22/13  3:45 AM      Result Value Range   Sodium 132 (*) 135 - 145 mEq/L   Potassium 3.2 (*) 3.5 - 5.1 mEq/L   Chloride 100  96 - 112 mEq/L   CO2 14 (*) 19 - 32 mEq/L   Glucose, Bld 223 (*) 70 - 99 mg/dL   Comment: LIPEMIC SPECIMEN   BUN 11  6 - 23 mg/dL   Creatinine, Ser 1.61 (*) 0.50 - 1.10 mg/dL   Calcium 7.5 (*) 8.4 - 10.5 mg/dL   GFR calc non Af Amer >90  >90 mL/min   GFR calc Af Amer >90  >90 mL/min   Comment: (NOTE)     The eGFR has been calculated using the CKD EPI equation.     This calculation has not been validated in all clinical situations.     eGFR's persistently <90 mL/min signify possible Chronic Kidney     Disease.  CBC     Status: Abnormal   Collection Time    06/22/13  3:45 AM      Result Value Range   WBC 8.0  4.0 - 10.5 K/uL   RBC 4.14  3.87 - 5.11 MIL/uL   Hemoglobin 12.6  12.0 - 15.0 g/dL   HCT 09.6 (*) 04.5 - 40.9 %   MCV 84.5  78.0 - 100.0 fL   MCH 30.4  26.0 - 34.0 pg   MCHC 36.0  30.0 - 36.0 g/dL   RDW 81.1  91.4 - 78.2 %   Platelets 215  150 - 400 K/uL  GLUCOSE, CAPILLARY     Status: Abnormal   Collection Time    06/22/13  4:26 AM      Result Value Range   Glucose-Capillary 194 (*) 70 - 99 mg/dL  BASIC METABOLIC PANEL     Status: Abnormal   Collection Time    06/22/13  5:20 AM      Result Value Range   Sodium 134 (*) 135 - 145 mEq/L   Potassium 3.1 (*) 3.5 - 5.1 mEq/L   Chloride 102  96 - 112 mEq/L   CO2 13 (*) 19 - 32 mEq/L   Glucose, Bld 181 (*) 70 - 99 mg/dL   BUN 10  6 - 23 mg/dL   Creatinine, Ser 9.56 (*) 0.50 - 1.10 mg/dL   Calcium 7.1 (*) 8.4 - 10.5 mg/dL   GFR calc non Af Amer >90  >90 mL/min   GFR calc Af Amer >90  >90 mL/min   Comment: (NOTE)     The eGFR has been calculated using the CKD EPI  equation.     This calculation has not been validated in all clinical situations.     eGFR's persistently <90 mL/min signify possible Chronic Kidney     Disease.  GLUCOSE, CAPILLARY     Status: Abnormal   Collection Time  06/22/13  5:28 AM      Result Value Range   Glucose-Capillary 197 (*) 70 - 99 mg/dL  GLUCOSE, CAPILLARY     Status: Abnormal   Collection Time    06/22/13  6:34 AM      Result Value Range   Glucose-Capillary 137 (*) 70 - 99 mg/dL  GLUCOSE, CAPILLARY     Status: Abnormal   Collection Time    06/22/13  7:39 AM      Result Value Range   Glucose-Capillary 206 (*) 70 - 99 mg/dL   Comment 1 Documented in Chart     Comment 2 Notify RN    GLUCOSE, CAPILLARY     Status: Abnormal   Collection Time    06/22/13  8:48 AM      Result Value Range   Glucose-Capillary 204 (*) 70 - 99 mg/dL  BASIC METABOLIC PANEL     Status: Abnormal   Collection Time    06/22/13  9:15 AM      Result Value Range   Sodium 133 (*) 135 - 145 mEq/L   Potassium 3.6  3.5 - 5.1 mEq/L   Chloride 104  96 - 112 mEq/L   CO2 15 (*) 19 - 32 mEq/L   Glucose, Bld 218 (*) 70 - 99 mg/dL   BUN 9  6 - 23 mg/dL   Creatinine, Ser 1.61 (*) 0.50 - 1.10 mg/dL   Calcium 7.4 (*) 8.4 - 10.5 mg/dL   GFR calc non Af Amer >90  >90 mL/min   GFR calc Af Amer >90  >90 mL/min   Comment: (NOTE)     The eGFR has been calculated using the CKD EPI equation.     This calculation has not been validated in all clinical situations.     eGFR's persistently <90 mL/min signify possible Chronic Kidney     Disease.  GLUCOSE, CAPILLARY     Status: Abnormal   Collection Time    06/22/13 10:07 AM      Result Value Range   Glucose-Capillary 239 (*) 70 - 99 mg/dL   Comment 1 Documented in Chart     Comment 2 Notify RN    GLUCOSE, CAPILLARY     Status: Abnormal   Collection Time    06/22/13 12:03 PM      Result Value Range   Glucose-Capillary 162 (*) 70 - 99 mg/dL   Comment 1 Documented in Chart     Comment 2 Notify RN     BASIC METABOLIC PANEL     Status: Abnormal   Collection Time    06/22/13  1:05 PM      Result Value Range   Sodium 131 (*) 135 - 145 mEq/L   Potassium 3.5  3.5 - 5.1 mEq/L   Chloride 103  96 - 112 mEq/L   CO2 19  19 - 32 mEq/L   Glucose, Bld 174 (*) 70 - 99 mg/dL   BUN 7  6 - 23 mg/dL   Creatinine, Ser 0.96 (*) 0.50 - 1.10 mg/dL   Calcium 7.5 (*) 8.4 - 10.5 mg/dL   GFR calc non Af Amer >90  >90 mL/min   GFR calc Af Amer >90  >90 mL/min   Comment: (NOTE)     The eGFR has been calculated using the CKD EPI equation.     This calculation has not been validated in all clinical situations.     eGFR's persistently <90 mL/min signify possible Chronic Kidney  Disease.  GLUCOSE, CAPILLARY     Status: Abnormal   Collection Time    06/22/13  1:11 PM      Result Value Range   Glucose-Capillary 171 (*) 70 - 99 mg/dL   Comment 1 Documented in Chart     Comment 2 Notify RN    GLUCOSE, CAPILLARY     Status: Abnormal   Collection Time    06/22/13  2:27 PM      Result Value Range   Glucose-Capillary 163 (*) 70 - 99 mg/dL   Comment 1 Documented in Chart     Comment 2 Notify RN    GLUCOSE, CAPILLARY     Status: Abnormal   Collection Time    06/22/13  3:33 PM      Result Value Range   Glucose-Capillary 193 (*) 70 - 99 mg/dL   Comment 1 Documented in Chart     Comment 2 Notify RN    GLUCOSE, CAPILLARY     Status: Abnormal   Collection Time    06/22/13  5:00 PM      Result Value Range   Glucose-Capillary 190 (*) 70 - 99 mg/dL   Comment 1 Documented in Chart     Comment 2 Notify RN    BASIC METABOLIC PANEL     Status: Abnormal   Collection Time    06/22/13  6:02 PM      Result Value Range   Sodium 130 (*) 135 - 145 mEq/L   Potassium 3.3 (*) 3.5 - 5.1 mEq/L   Chloride 100  96 - 112 mEq/L   CO2 21  19 - 32 mEq/L   Glucose, Bld 165 (*) 70 - 99 mg/dL   BUN 6  6 - 23 mg/dL   Creatinine, Ser <0.98 (*) 0.50 - 1.10 mg/dL   Comment: REPEATED TO VERIFY   Calcium 7.6 (*) 8.4 - 10.5 mg/dL    GFR calc non Af Amer NOT CALCULATED  >90 mL/min   GFR calc Af Amer NOT CALCULATED  >90 mL/min   Comment: (NOTE)     The eGFR has been calculated using the CKD EPI equation.     This calculation has not been validated in all clinical situations.     eGFR's persistently <90 mL/min signify possible Chronic Kidney     Disease.  GLUCOSE, CAPILLARY     Status: Abnormal   Collection Time    06/22/13  6:54 PM      Result Value Range   Glucose-Capillary 212 (*) 70 - 99 mg/dL   Comment 1 Documented in Chart     Comment 2 Notify RN    GLUCOSE, CAPILLARY     Status: Abnormal   Collection Time    06/22/13  8:04 PM      Result Value Range   Glucose-Capillary 246 (*) 70 - 99 mg/dL  GLUCOSE, CAPILLARY     Status: Abnormal   Collection Time    06/22/13 11:57 PM      Result Value Range   Glucose-Capillary 194 (*) 70 - 99 mg/dL  BASIC METABOLIC PANEL     Status: Abnormal   Collection Time    06/23/13 12:12 AM      Result Value Range   Sodium 137  135 - 145 mEq/L   Potassium 3.3 (*) 3.5 - 5.1 mEq/L   Chloride 104  96 - 112 mEq/L   CO2 23  19 - 32 mEq/L   Glucose, Bld 201 (*) 70 - 99 mg/dL  BUN 5 (*) 6 - 23 mg/dL   Creatinine, Ser 1.61 (*) 0.50 - 1.10 mg/dL   Comment: DELTA CHECK NOTED   Calcium 7.8 (*) 8.4 - 10.5 mg/dL   GFR calc non Af Amer >90  >90 mL/min   GFR calc Af Amer >90  >90 mL/min   Comment: (NOTE)     The eGFR has been calculated using the CKD EPI equation.     This calculation has not been validated in all clinical situations.     eGFR's persistently <90 mL/min signify possible Chronic Kidney     Disease.  GLUCOSE, CAPILLARY     Status: Abnormal   Collection Time    06/23/13  4:23 AM      Result Value Range   Glucose-Capillary 228 (*) 70 - 99 mg/dL  BASIC METABOLIC PANEL     Status: Abnormal   Collection Time    06/23/13  6:13 AM      Result Value Range   Sodium 138  135 - 145 mEq/L   Potassium 3.5  3.5 - 5.1 mEq/L   Chloride 105  96 - 112 mEq/L   CO2 22  19 - 32  mEq/L   Glucose, Bld 289 (*) 70 - 99 mg/dL   BUN 4 (*) 6 - 23 mg/dL   Creatinine, Ser 0.96 (*) 0.50 - 1.10 mg/dL   Calcium 8.0 (*) 8.4 - 10.5 mg/dL   GFR calc non Af Amer >90  >90 mL/min   GFR calc Af Amer >90  >90 mL/min   Comment: (NOTE)     The eGFR has been calculated using the CKD EPI equation.     This calculation has not been validated in all clinical situations.     eGFR's persistently <90 mL/min signify possible Chronic Kidney     Disease.  GLUCOSE, CAPILLARY     Status: Abnormal   Collection Time    06/23/13  8:17 AM      Result Value Range   Glucose-Capillary 240 (*) 70 - 99 mg/dL  GLUCOSE, CAPILLARY     Status: Abnormal   Collection Time    06/23/13 12:10 PM      Result Value Range   Glucose-Capillary 300 (*) 70 - 99 mg/dL    Ct Head Wo Contrast  06/22/2013   CLINICAL DATA:  Diabetic ketoacidosis with right-sided headache.  EXAM: CT HEAD WITHOUT CONTRAST  TECHNIQUE: Contiguous axial images were obtained from the base of the skull through the vertex without contrast.  COMPARISON:  None  FINDINGS: Normal appearance of the intracranial structures. No evidence for acute hemorrhage, mass lesion, midline shift, hydrocephalus or large infarct. No acute bony abnormality. The visualized right maxillary sinus is opacified.  IMPRESSION: No acute intracranial abnormality.  Right maxillary sinus disease which is incompletely evaluated.   Electronically Signed   By: Richarda Overlie M.D.   On: 06/22/2013 08:24   US Abdomen Complete  06/22/2013   CLINICAL DATA:  Right flank pain  EXAM: ULTRASOUND ABDOMEN COMPLETE  COMPARISON:  None.  FINDINGS: Gallbladder  There is no evidence for gallstones, gallbladder wall thickening, or pericholecystic fluid. The sonographer reports no sonographic Murphy sign.  Common bile duct  Diameter: Nondilated at 4 mm diameter.  Liver  No focal lesion identified. Within normal limits in parenchymal echogenicity.  IVC  No abnormality visualized.  Pancreas  Visualized  portion unremarkable.  Spleen  Size and appearance within normal limits.  Right Kidney  Length: 13.7 Echogenicity within normal limits. No  mass or hydronephrosis visualized.  Left Kidney  Length: 12.6 Echogenicity within normal limits. No mass or hydronephrosis visualized.  Abdominal aorta  No aneurysm visualized.  IMPRESSION: Unremarkable abdominal ultrasound. Specifically, no findings to explain the patient's history of right flank pain.   Electronically Signed   By: Kennith Center M.D.   On: 06/22/2013 09:30    Positive for anxiety, bad mood, depression and sleep disturbance Blood pressure 87/72, pulse 76, temperature 98 F (36.7 C), temperature source Axillary, resp. rate 18, height 5\' 10"  (1.778 m), weight 62.8 kg (138 lb 7.2 oz), last menstrual period 06/07/2013, SpO2 98.00%.   Assessment/Plan: Major Depression disorder, single  Recommendation:  Patient does not meet criteria for acute psychiatric hospitalization  Patient will be referred to the outpatient psychiatric services to [facility]  Discuss this and benefits of the medication Start Wellbutrin SR 100 mg a day for depression  Start hydroxyzine 50 mg at bedtime for sleep Appreciate psychiatric consultation Followup as needed    Achillies Buehl,JANARDHAHA R. 06/23/2013, 1:24 PM

## 2013-06-24 LAB — BASIC METABOLIC PANEL
BUN: 14 mg/dL (ref 6–23)
BUN: 11 mg/dL (ref 6–23)
BUN: 15 mg/dL (ref 6–23)
CO2: 28 mEq/L (ref 19–32)
CO2: 30 mEq/L (ref 19–32)
CO2: 28 mEq/L (ref 19–32)
CO2: 29 mEq/L (ref 19–32)
Calcium: 8.4 mg/dL (ref 8.4–10.5)
Calcium: 8.2 mg/dL — ABNORMAL LOW (ref 8.4–10.5)
Calcium: 8.6 mg/dL (ref 8.4–10.5)
Chloride: 101 mEq/L (ref 96–112)
Chloride: 104 mEq/L (ref 96–112)
Chloride: 98 mEq/L (ref 96–112)
Chloride: 97 mEq/L (ref 96–112)
Creatinine, Ser: 0.32 mg/dL — ABNORMAL LOW (ref 0.50–1.10)
Creatinine, Ser: 0.34 mg/dL — ABNORMAL LOW (ref 0.50–1.10)
Creatinine, Ser: 0.39 mg/dL — ABNORMAL LOW (ref 0.50–1.10)
GFR calc Af Amer: >90 mL/min (ref >90)
GFR calc Af Amer: >90 mL/min (ref >90)
Glucose, Bld: 297 mg/dL — ABNORMAL HIGH (ref 70–99)
Glucose, Bld: 358 mg/dL — ABNORMAL HIGH (ref 70–99)
Glucose, Bld: 459 mg/dL — ABNORMAL HIGH (ref 70–99)
Potassium: 3.9 mEq/L (ref 3.5–5.1)
Potassium: 4.5 mEq/L (ref 3.5–5.1)
Sodium: 139 mEq/L (ref 135–145)
Sodium: 137 mEq/L (ref 135–145)

## 2013-06-24 LAB — GLUCOSE, CAPILLARY
Glucose-Capillary: 141 mg/dL — ABNORMAL HIGH (ref 70–99)
Glucose-Capillary: 243 mg/dL — ABNORMAL HIGH (ref 70–99)
Glucose-Capillary: 247 mg/dL — ABNORMAL HIGH (ref 70–99)
Glucose-Capillary: 287 mg/dL — ABNORMAL HIGH (ref 70–99)
Glucose-Capillary: 371 mg/dL — ABNORMAL HIGH (ref 70–99)
Glucose-Capillary: 372 mg/dL — ABNORMAL HIGH (ref 70–99)

## 2013-06-24 MED ORDER — OXYCODONE-ACETAMINOPHEN 5-325 MG PO TABS: 1.0000 | ORAL_TABLET | Freq: Four times a day (QID) | ORAL | Status: DC | PRN

## 2013-06-24 MED ORDER — INSULIN ASPART 100 UNIT/ML ~~LOC~~ SOLN: 10.0000 [IU] | Freq: Three times a day (TID) | SUBCUTANEOUS | Status: DC

## 2013-06-24 MED ORDER — HYDROXYZINE HCL 50 MG PO TABS: 50.0000 mg | ORAL_TABLET | Freq: Every evening | ORAL | Status: DC | PRN

## 2013-06-24 MED ORDER — INSULIN GLARGINE 100 UNIT/ML ~~LOC~~ SOLN: 55.0000 [IU] | Freq: Every day | SUBCUTANEOUS | Status: DC

## 2013-06-24 MED ORDER — BUPROPION HCL ER (SR) 100 MG PO TB12: 100.0000 mg | ORAL_TABLET | Freq: Every day | ORAL | Status: DC

## 2013-06-24 NOTE — Discharge Summary (Addendum)
Physician Discharge Summary  Julie [name] HQI:[id number] DOB: 11/12/1990 DOA: 06/21/2013  PCP: Pcp Not In System  Admit date: 06/21/2013 Discharge date: 06/25/2013  10/6 Addendum:  Patient was scheduled for discharge on 10/5 but remained overnight in order for case management to ensure the patient had a plan to obtain insulin on discharge.    Time spent: 50* minutes  Recommendations for Outpatient Follow-up:  1. Follow up PCP in 2 weeks 2. Follow up [facility] in 4 weeks  Discharge Diagnoses:  Principal Problem:   DKA, type 1 Active Problems:   Rt flank pain   H/O medication noncompliance   Dehydration   Headache(784.0)   Depressive disorder, not elsewhere classified   Discharge Condition: Stable  Diet recommendation: Diabetic diet  Filed Weights   06/21/13 2206 06/22/13 0206  Weight: 63.504 kg (140 lb) 62.8 kg (138 lb 7.2 oz)    History of present illness:  22 y.o. female with type 1 diabetes with frequent DKA's, last admission for DKA July 2014, presents to the [facility] with abdominal pain and pain nausea and vomiting and found to be in DKA with bicarbonate of 13. Further evaluation included a negative pregnancy test, negative UA for infection , blood glucose greater than 700's mg/L , creatinine of 0.3mg /dL and potassium of 4 mEq/L . Patient is alert and oriented and able to give a good history. There has been no fever, chills, cough, dysuria. She hadn't been taking her insulin for 3 weeks because of cost constraint. She also had bitemporal HA for 3 weeks. No Hx of migraine, no nasal discharge or facial pain. Hospitalist was asked to admit her for DKA.   Hospital Course:  DKA:  Patient was admitted with DKA and started on Insulin drip, DKA resolved and she was put back on lantus and ssi. Blood glucose continued to be elevated so she was also started on meal coverage.  At this time Blood glucose is well controlled and she will be discharged home on  Novolin 70/30 40 units BID.  Case management has worked with the patient to ensure she obtains her insulin on D/C.  There was an issue with her medicaid (her assigned medicaid doctor was an OB/GYN rather than a PCP).  Financial counseling will see her prior to d/c in order to assist with that issue.  Hypokalemia:  -Potassium was replaced. -Resolved  Dehydration:  - Secondary to DKA  -BUN, Cr has improved   Headache:  - Likely tension-type; musculature on R-side neck tender to touch  - Increased stress and lack of sleep  - CT Head negative  - Was given Dialudid and changed to Percocet, she will be discharged home on prn Percocet.    Right Flank Pain:  - Seems muscular in nature  - UA negative  - US abdomen negative  - Dilaudid relieved pain  - For  pain percocet prn   ? Chronic pain Patient has complained of headache, flank pain , B/L  Knee and shin pains. Will continue with percocet prn.She will need evaluation by pain clinic as outpatient.  Depressed Mood:  - Very tearful and mentioned feeling sad and very stressed out due to work and family needs. 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   - Mentioned issues with anorexia/bulemia since February. States she was bigger and lost weight, but now she is "terrified to get fat again."   - Psych was consulted and here is their recommendation: Patient does not  meet criteria for acute psychiatric hospitalization  Patient will be referred to the outpatient psychiatric services to [facility].  Discuss this and benefits of the medication.  Psych recommended wellbutrin sr (for depression) and hydroxyzine (for sleep).  However due to medication expense - these were changed to diphenhydramine for sleep, and Prozac for depression.      Consultations:  psychiatry  Discharge Exam: Filed Vitals:   06/25/13 0409  BP: 101/62  Pulse: 82  Temp: 99 F (37.2 C)  Resp: 18    General: Appear in no  acute distress Cardiovascular: S1s2 RRR Respiratory: Clear bilaterally Abdomen: Soft, nontender, non distended, +BS Extremities:  Diffusely weak in all four.  Slightly weaker on the left than right.  Discharge Instructions      Discharge Orders   Future Orders Complete By Expires   Diet - low sodium heart healthy  As directed    Diet Carb Modified  As directed    Increase activity slowly  As directed    Increase activity slowly  As directed        Medication List    STOP taking these medications       insulin aspart 100 UNIT/ML injection  Commonly known as:  novoLOG     insulin glargine 100 UNIT/ML injection  Commonly known as:  LANTUS      TAKE these medications       aspirin-acetaminophen-caffeine 250-250-65 MG per tablet  Commonly known as:  EXCEDRIN MIGRAINE  Take 2 tablets by mouth every 6 (six) hours as needed for pain.     diphenhydrAMINE 25 mg capsule  Commonly known as:  BENADRYL  Take 1-2 capsules (25-50 mg total) by mouth at bedtime as needed for sleep.     FLUoxetine 10 MG capsule  Commonly known as:  PROZAC  Take 1 capsule (10 mg total) by mouth daily.     ibuprofen 200 MG tablet  Commonly known as:  ADVIL,MOTRIN  Take 400 mg by mouth daily as needed for pain.     insulin NPH-regular (70-30) 100 UNIT/ML injection  Commonly known as:  NOVOLIN 70/30  Inject 40 Units into the skin 2 (two) times daily with a meal.     oxyCODONE-acetaminophen 5-325 MG per tablet  Commonly known as:  PERCOCET/ROXICET  Take 1-2 tablets by mouth every 6 (six) hours as needed.       No Known Allergies Follow-up Information   Follow up with [facility] In 4 weeks.      Follow up with Dr Elita Quick  In 2 weeks.       The results of significant diagnostics from this hospitalization (including imaging, microbiology, ancillary and laboratory) are listed below for reference.    Significant Diagnostic Studies: Ct Head Wo Contrast  06/22/2013   CLINICAL DATA:   Diabetic ketoacidosis with right-sided headache.  EXAM: CT HEAD WITHOUT CONTRAST  TECHNIQUE: Contiguous axial images were obtained from the base of the skull through the vertex without contrast.  COMPARISON:  None  FINDINGS: Normal appearance of the intracranial structures. No evidence for acute hemorrhage, mass lesion, midline shift, hydrocephalus or large infarct. No acute bony abnormality. The visualized right maxillary sinus is opacified.  IMPRESSION: No acute intracranial abnormality.  Right maxillary sinus disease which is incompletely evaluated.   Electronically Signed   By: Richarda Overlie M.D.   On: 06/22/2013 08:24   US Abdomen Complete  06/22/2013   CLINICAL DATA:  Right flank pain  EXAM: ULTRASOUND ABDOMEN COMPLETE  COMPARISON:  None.  FINDINGS: Gallbladder  There is no evidence for gallstones, gallbladder wall thickening, or pericholecystic fluid. The sonographer reports no sonographic Murphy sign.  Common bile duct  Diameter: Nondilated at 4 mm diameter.  Liver  No focal lesion identified. Within normal limits in parenchymal echogenicity.  IVC  No abnormality visualized.  Pancreas  Visualized portion unremarkable.  Spleen  Size and appearance within normal limits.  Right Kidney  Length: 13.7 Echogenicity within normal limits. No mass or hydronephrosis visualized.  Left Kidney  Length: 12.6 Echogenicity within normal limits. No mass or hydronephrosis visualized.  Abdominal aorta  No aneurysm visualized.  IMPRESSION: Unremarkable abdominal ultrasound. Specifically, no findings to explain the patient's history of right flank pain.   Electronically Signed   By: Kennith Center M.D.   On: 06/22/2013 09:30     Labs: Basic Metabolic Panel:  Recent Labs Lab 06/24/13 0601 06/24/13 1338 06/24/13 1842 06/25/13 0020 06/25/13 0545  NA 141 136 137 136 138  K 3.9 4.5 4.2 4.1 3.5  CL 104 98 97 96 98  CO2 30 28 29 29  33*  GLUCOSE 190* 358* 459* 308* 208*  BUN 12 11 15 16 13   CREATININE 0.38* 0.34*  0.39* 0.27* 0.32*  CALCIUM 8.5 8.2* 8.6 9.1 8.8   Liver Function Tests:  Recent Labs Lab 06/21/13 2225  AST <5  ALT 6  ALKPHOS 107  BILITOT 0.1*  PROT 7.6  ALBUMIN 3.6   CBC:  Recent Labs Lab 06/21/13 2225 06/22/13 0345  WBC 7.4 8.0  NEUTROABS 4.2  --   HGB 14.9 12.6  HCT 42.8 35.0*  MCV 88.1 84.5  PLT 241 215   CBG:  Recent Labs Lab 06/24/13 1600 06/24/13 2011 06/24/13 2351 06/25/13 0402 06/25/13 0743  GLUCAP 372* 371* 287* 182* 192*       Signed:  Stephani Police, PA-C  [facility] 06/25/2013, 10:42 AM

## 2013-06-24 NOTE — Progress Notes (Signed)
   CARE MANAGEMENT NOTE 06/24/2013  Patient:  Julie [name], Julie [name]   Account Number:  [id number]  Date Initiated:  06/24/2013  Documentation initiated by:  [facility]  Subjective/Objective Assessment:   DM, DKA     Action/Plan:   Anticipated DC Date:  06/24/2013   Anticipated DC Plan:  HOME/SELF CARE      DC Planning Services  CM consult      Choice offered to / List presented to:             Status of service:  In process, will continue to follow Medicare Important Message given?   (If response is "NO", the following Medicare IM given date fields will be blank) Date Medicare IM given:   Date Additional Medicare IM given:    Discharge Disposition:    Per UR Regulation:    If discussed at Long Length of Stay Meetings, dates discussed:    Comments:  06/24/13 14:00 CM spoke with MD in hall who states pt has been having trouble getting her meds at her pharmacy even though she is a Medicaid pt.  MD states she can have her dose of Lantus prior to leaving the hospital and pt states she has enough Novolog (20units) to get her through until tomorrow when MD suggested she come to [facility] outpt pharmacy with her [employer] card and possibly we can research her problem on Monday when CMA is here.   CM spoke with pt in room who states she goes to the [facility] on the corner of [address] and [address].  Pt states she has Medicaid but [facility] states none of ther meds are covered. CM gave pt 75% discount card to reduce cost of meds but even with the 75% reduction, pt cannot afford Lantus and Novolog.  Pt not eligible for MATCH because she used it in February 2014.   Multiple attempts were made to contact pharmacy and finally got through to the pharmacy who did a test run for me on Lantus and states the reason pt is not covered is because the plan she has only covers birth control medications.  MD notified and discharge cancelled.  CM will leave a note to weekday CM to identify other options  for medications.  Freddy Jaksch, BSN, CM [phone].

## 2013-06-25 DIAGNOSIS — F329 Major depressive disorder, single episode, unspecified: Secondary | ICD-10-CM

## 2013-06-25 DIAGNOSIS — F3289 Other specified depressive episodes: Secondary | ICD-10-CM

## 2013-06-25 LAB — BASIC METABOLIC PANEL
BUN: 15 mg/dL (ref 6–23)
CO2: 29 mEq/L (ref 19–32)
Calcium: 9.1 mg/dL (ref 8.4–10.5)
Calcium: 8.8 mg/dL (ref 8.4–10.5)
Calcium: 9.2 mg/dL (ref 8.4–10.5)
Chloride: 98 mEq/L (ref 96–112)
Creatinine, Ser: 0.32 mg/dL — ABNORMAL LOW (ref 0.50–1.10)
Creatinine, Ser: 0.37 mg/dL — ABNORMAL LOW (ref 0.50–1.10)
GFR calc Af Amer: >90 mL/min (ref >90)
GFR calc Af Amer: >90 mL/min (ref >90)
GFR calc non Af Amer: >90 mL/min (ref >90)
GFR calc non Af Amer: >90 mL/min (ref >90)
GFR calc non Af Amer: >90 mL/min (ref >90)
Glucose, Bld: 308 mg/dL — ABNORMAL HIGH (ref 70–99)
Potassium: 4.1 mEq/L (ref 3.5–5.1)
Sodium: 136 mEq/L (ref 135–145)
Sodium: 138 mEq/L (ref 135–145)

## 2013-06-25 LAB — GLUCOSE, CAPILLARY: Glucose-Capillary: 182 mg/dL — ABNORMAL HIGH (ref 70–99)

## 2013-06-25 MED ORDER — BUPROPION HCL ER (SR) 100 MG PO TB12: 100.0000 mg | ORAL_TABLET | Freq: Every day | ORAL | Status: DC

## 2013-06-25 MED ORDER — HYDROXYZINE HCL 50 MG PO TABS
50.0000 mg | ORAL_TABLET | Freq: Every day | ORAL | Status: DC
  Filled 2013-06-25: qty 1

## 2013-06-25 MED ORDER — DIPHENHYDRAMINE HCL 25 MG PO CAPS: 25.0000 mg | ORAL_CAPSULE | Freq: Every evening | ORAL | Status: DC | PRN

## 2013-06-25 MED ORDER — FLUOXETINE HCL 10 MG PO CAPS: 10.0000 mg | ORAL_CAPSULE | Freq: Every day | ORAL | Status: DC

## 2013-06-25 MED ORDER — BUPROPION HCL ER (SR) 100 MG PO TB12
100.0000 mg | ORAL_TABLET | Freq: Every day | ORAL | Status: DC
  Filled 2013-06-25 (×2): qty 1

## 2013-06-25 MED ORDER — INSULIN NPH ISOPHANE & REGULAR (70-30) 100 UNIT/ML ~~LOC~~ SUSP: 40.0000 [IU] | Freq: Two times a day (BID) | SUBCUTANEOUS | Status: DC

## 2013-06-25 MED ORDER — HYDROXYZINE HCL 25 MG PO TABS: 25.0000 mg | ORAL_TABLET | Freq: Every day | ORAL | Status: DC

## 2013-06-25 MED ORDER — "INSULIN SYRINGE-NEEDLE U-100 30G X 1/2"" 0.5 ML MISC": 1.0000 | Freq: Three times a day (TID) | Status: DC

## 2013-06-25 NOTE — Progress Notes (Signed)
Grey earring with 5 stones found in pt's room after discharge. Attempted to reach pt using numbers on file (primary home, as well as emergency contact #), and both numbers disconnected. Will keep earring on 5W if pt contacts [place] regarding missing earring.

## 2013-06-25 NOTE — Progress Notes (Signed)
Patient discharge teaching given, including activity, diet, follow-up appoints, and medications. Patient verbalized understanding of all discharge instructions. IV access was d/c'd. Vitals are stable. Skin is intact except as charted in most recent assessments. Pt to be escorted out by NT, to be driv3en home by family.  Peri Maris, MBA, BS, RN

## 2013-06-25 NOTE — Discharge Summary (Signed)
Addendum  Patient seen and examined, chart and data base reviewed.  I agree with the above assessment and plan.  For full details please see Mrs. Algis Downs PA note.  Difficult social situation, DKA secondary to nonadherence with medications.  Depression, psych recommended Wellbutrin which switched to Prozac for the sake of expenses.   Clint Lipps, MD [facility] Pager: [phone] 06/25/2013, 11:14 AM

## 2013-06-25 NOTE — Progress Notes (Signed)
Inpatient Diabetes Program Recommendations  AACE/ADA: New Consensus Statement on Inpatient Glycemic Control (2013)  Target Ranges:  Prepandial:   less than 140 mg/dL      Peak postprandial:   less than 180 mg/dL (1-2 hours)      Critically ill patients:  140 - 180 mg/dL   Reason for Visit: Patient being discharged on 70/30 insulin  Note: Discussed with patient cost of 70/30 insulin (about $25) and composition of 70/30.  Stressed need to take insulin at consistent times and to not skip meals.  Discussed need to keep a treatment for hypoglycemia with her at all times-- patient states she never has hypoglycemia.  Discussed some appropriate treatments for hypoglycemia such as 15 gms of quick acting candy such as Sweet Tarts, Glucose Tablets, etc.  Patient asked how this insulin will affect her bulmia.  I emphasized the need to "not throw up food" because hypoglycemia will be likely if insulin is taken.  Informed her that people with bulemia sometimes die from it-- even when they have no other health problems-- because it gets electrolytes, etc out of balance.  Reminded her of need to "be there" for her daughter. Discussed ways of distracting from urge to vomit-- such as walking.  Suggested she seek positive ways of controlling weight such as exercise-- and to work with Vesta Mixer and PCP to keep insulin and weight in balance.  Thank you.  Brytnee Bechler S. Elsie Lincoln, RN, CNS, CDE Inpatient Diabetes Program, team pager [phone]

## 2013-06-25 NOTE — Clinical Social Work Note (Signed)
CSW received consult to notify Psych CSW that patient needs resources and information on treatment options related to her depression. Psych CSW notified. This CSW signing off.  Roddie Mc, [place], [place], [phone]

## 2013-06-25 NOTE — Evaluation (Signed)
Physical Therapy Evaluation Patient Details Name: Julie [name] MRN: [id number] DOB: Jan 26, 1991 Today's Date: 06/25/2013 Time: 3086-5784 PT Time Calculation (min): 18 min  PT Assessment / Plan / Recommendation History of Present Illness  frequent DKA's; pt with type 1 diabetes; adm due to abdominal pain and n/v   Clinical Impression  Pt is a 22 y.o female adm due to abdominal pain and n/v. Pt is inconsistent with gait. Pt is steady but when cued can become unsteady. No LOB noted. Pt reports she will have 24/7 (A) and supervision at home with her parents.Pt main complaint today was bil LE pain. Reports it is worse when amb and feels as though her "bones are breaking".Pt very tearful when mentioning D/C plan today.  PT will sign off on pt as there are no acute PT needs at this time.    PT Assessment  Patent does not need any further PT services;All further PT needs can be met in the next venue of care    Follow Up Recommendations  Supervision/Assistance - 24 hour    Does the patient have the potential to tolerate intense rehabilitation      Barriers to Discharge        Equipment Recommendations  None recommended by PT    Recommendations for Other Services     Frequency      Precautions / Restrictions Precautions Precautions: None Restrictions Weight Bearing Restrictions: No   Pertinent Vitals/Pain 6/10 in bil LEs; patient repositioned for comfort       Mobility  Bed Mobility Bed Mobility: Supine to Sit Supine to Sit: HOB elevated;7: Independent Details for Bed Mobility Assistance: no physical (A) needed; pt demo good technique  Transfers Transfers: Sit to Stand;Stand to Sit Sit to Stand: From bed;5: Supervision Stand to Sit: To chair/3-in-1;With armrests;5: Supervision Details for Transfer Assistance: supervision for safety; pt demo good technique; no LOB noted  Ambulation/Gait Ambulation/Gait Assistance: 5: Supervision Ambulation Distance (Feet): 80  Feet Assistive device: None (handheld (A) at times) Ambulation/Gait Assistance Details: pt inconsistent with gt; pt would demo good technique but with cues pt would become unsteady but would recover independently; pt has been amb to/from bathroom independently; she denies any falls or dizziniess recently; c/o bil LE pain during amb  Gait Pattern: Step-through pattern;Decreased stride length Gait velocity: slow but was able to increase to [facility] with cues; inconsistent gt speed and steadiness   Stairs: No Wheelchair Mobility Wheelchair Mobility: No         PT Diagnosis: Acute pain  PT Problem List:   PT Treatment Interventions:       PT Goals(Current goals can be found in the care plan section) Acute Rehab PT Goals Patient Stated Goal: no goal stated  PT Goal Formulation: With patient  Visit Information  Last PT Received On: 06/25/13 Assistance Needed: +1 History of Present Illness: frequent DKA's; pt with type 1 diabetes; adm due to abdominal pain and n/v        Prior Functioning  Home Living Family/patient expects to be discharged to:: Private residence Living Arrangements: Parent;Children Available Help at Discharge: Family;Available 24 hours/day Type of Home: House Home Access: Level entry Entrance Stairs-Rails: None Home Layout: One level Home Equipment: None Prior Function Level of Independence: Independent Communication Communication: No difficulties Dominant Hand: Right    Cognition  Cognition Arousal/Alertness: Awake/alert Behavior During Therapy: WFL for tasks assessed/performed Overall Cognitive Status: Within Functional Limits for tasks assessed    Extremity/Trunk Assessment Upper Extremity Assessment Upper Extremity Assessment:  Overall WFL for tasks assessed Lower Extremity Assessment Lower Extremity Assessment: RLE deficits/detail;LLE deficits/detail RLE Sensation:  (pt c/o numbness and tingling ) LLE Sensation:  (pt c/o numbness and tingling  ) Cervical / Trunk Assessment Cervical / Trunk Assessment: Normal   Balance Balance Balance Assessed: Yes Static Standing Balance Static Standing - Balance Support: During functional activity;No upper extremity supported Static Standing - Level of Assistance: 5: Stand by assistance  End of Session PT - End of Session Equipment Utilized During Treatment: Gait belt Activity Tolerance: Patient tolerated treatment well Patient left: in chair;with call bell/phone within reach Nurse Communication: Mobility status;Other (comment) (D/C planning )  GP     Donell Sievert, [place] [phone] 06/25/2013, 11:58 AM

## 2013-07-25 ENCOUNTER — Inpatient Hospital Stay (HOSPITAL_BASED_OUTPATIENT_CLINIC_OR_DEPARTMENT_OTHER)
Admission: EM | Admit: 2013-07-25 | Discharge: 2013-07-27 | DRG: 638 | Disposition: A | Discharge status: Home / Self Care | Payer: Medicaid Other | Attending: Internal Medicine | Admitting: Internal Medicine

## 2013-07-25 ENCOUNTER — Encounter (HOSPITAL_BASED_OUTPATIENT_CLINIC_OR_DEPARTMENT_OTHER): Payer: Self-pay | Admitting: Emergency Medicine

## 2013-07-25 DIAGNOSIS — E46 Unspecified protein-calorie malnutrition: Secondary | ICD-10-CM | POA: Diagnosis present

## 2013-07-25 DIAGNOSIS — R29898 Other symptoms and signs involving the musculoskeletal system: Secondary | ICD-10-CM

## 2013-07-25 DIAGNOSIS — E1110 Type 2 diabetes mellitus with ketoacidosis without coma: Secondary | ICD-10-CM

## 2013-07-25 DIAGNOSIS — F329 Major depressive disorder, single episode, unspecified: Secondary | ICD-10-CM

## 2013-07-25 DIAGNOSIS — M549 Dorsalgia, unspecified: Secondary | ICD-10-CM

## 2013-07-25 DIAGNOSIS — G8929 Other chronic pain: Secondary | ICD-10-CM | POA: Diagnosis present

## 2013-07-25 DIAGNOSIS — Z91199 Patient's noncompliance with other medical treatment and regimen due to unspecified reason: Secondary | ICD-10-CM

## 2013-07-25 DIAGNOSIS — R632 Polyphagia: Secondary | ICD-10-CM | POA: Diagnosis present

## 2013-07-25 DIAGNOSIS — E860 Dehydration: Secondary | ICD-10-CM | POA: Diagnosis present

## 2013-07-25 DIAGNOSIS — Z833 Family history of diabetes mellitus: Secondary | ICD-10-CM

## 2013-07-25 DIAGNOSIS — Z91148 Patient's other noncompliance with medication regimen for other reason: Secondary | ICD-10-CM

## 2013-07-25 DIAGNOSIS — L080 Pyoderma: Secondary | ICD-10-CM

## 2013-07-25 DIAGNOSIS — R109 Unspecified abdominal pain: Secondary | ICD-10-CM

## 2013-07-25 DIAGNOSIS — Z794 Long term (current) use of insulin: Secondary | ICD-10-CM

## 2013-07-25 DIAGNOSIS — K0510 Chronic gingivitis, plaque induced: Secondary | ICD-10-CM

## 2013-07-25 DIAGNOSIS — Z9114 Patient's other noncompliance with medication regimen: Secondary | ICD-10-CM

## 2013-07-25 DIAGNOSIS — Z9119 Patient's noncompliance with other medical treatment and regimen: Secondary | ICD-10-CM

## 2013-07-25 DIAGNOSIS — E876 Hypokalemia: Secondary | ICD-10-CM | POA: Diagnosis present

## 2013-07-25 DIAGNOSIS — F3289 Other specified depressive episodes: Secondary | ICD-10-CM

## 2013-07-25 DIAGNOSIS — I959 Hypotension, unspecified: Secondary | ICD-10-CM | POA: Diagnosis present

## 2013-07-25 DIAGNOSIS — Z23 Encounter for immunization: Secondary | ICD-10-CM

## 2013-07-25 DIAGNOSIS — IMO0002 Reserved for concepts with insufficient information to code with codable children: Secondary | ICD-10-CM

## 2013-07-25 DIAGNOSIS — R51 Headache: Secondary | ICD-10-CM

## 2013-07-25 DIAGNOSIS — E1010 Type 1 diabetes mellitus with ketoacidosis without coma: Secondary | ICD-10-CM | POA: Diagnosis present

## 2013-07-25 LAB — URINALYSIS, ROUTINE W REFLEX MICROSCOPIC
Glucose, UA: >1000 mg/dL — AB
Leukocytes, UA: NEGATIVE
Nitrite: NEGATIVE
Specific Gravity, Urine: 1.03 (ref 1.005–1.030)
pH: 5.5 (ref 5.0–8.0)

## 2013-07-25 LAB — GLUCOSE, CAPILLARY

## 2013-07-25 LAB — URINE MICROSCOPIC-ADD ON

## 2013-07-25 LAB — PREGNANCY, URINE: Preg Test, Ur: NEGATIVE

## 2013-07-25 MED ORDER — SODIUM CHLORIDE 0.9 % IV SOLN
INTRAVENOUS | Status: DC
  Administered 2013-07-26: 4.1 [IU]/h via INTRAVENOUS

## 2013-07-25 MED ORDER — ONDANSETRON HCL 4 MG/2ML IJ SOLN
4.0000 mg | Freq: Once | INTRAMUSCULAR | Status: AC
  Administered 2013-07-26: 4 mg via INTRAVENOUS
  Filled 2013-07-25: qty 2

## 2013-07-25 MED ORDER — SODIUM CHLORIDE 0.9 % IV BOLUS (SEPSIS)
2000.0000 mL | Freq: Once | INTRAVENOUS | Status: AC
  Administered 2013-07-26: 2000 mL via INTRAVENOUS

## 2013-07-25 MED ORDER — FENTANYL CITRATE 0.05 MG/ML IJ SOLN
100.0000 ug | Freq: Once | INTRAMUSCULAR | Status: AC
  Administered 2013-07-26: 100 ug via INTRAVENOUS
  Filled 2013-07-25: qty 2

## 2013-07-25 NOTE — ED Provider Notes (Signed)
CSN: [id number]     Arrival date & time 07/25/13  2322 History   None    This chart was scribed for Hanley Seamen, MD by Arlan Organ, ED Scribe. This patient was seen in room MH05/MH05 and the patient's care was started 11:51 PM.   Chief Complaint  Patient presents with  . Vomiting    HPI   HPI Comments: Julie [name] is a 22 y.o. Female with a hx of Type 1 Diabetes who presents to the Emergency Department complaining of vomiting that started today around 1 PM. Pt admits to not taking her insulin regularly. She has a history of multiple admissions for DKA and medication noncompliance. She states she is also experiencing abdominal pain, SOB, HA, and back pain. She states her pain is moderate to severe. She reports polyuria and polydipsia. She denies having a PCP. She has had 4 prior admissions this year for diabetic ketoacidosis and medication noncompliance.  Past Medical History  Diagnosis Date  . Diabetes mellitus without complication   . Noncompliance with diabetes treatment    Past Surgical History  Procedure Laterality Date  . Cesarean section    . Cesarean section  2012   Family History  Problem Relation Age of Onset  . Diabetes Mother    History  Substance Use Topics  . Smoking status: Never Smoker   . Smokeless tobacco: Not on file  . Alcohol Use: No   OB History   Grav Para Term Preterm Abortions TAB SAB Ect Mult Living                 Review of Systems  All other systems reviewed and are negative.    Allergies  Review of patient's allergies indicates no known allergies.  Home Medications   Current Outpatient Rx  Name  Route  Sig  Dispense  Refill  . aspirin-acetaminophen-caffeine (EXCEDRIN MIGRAINE) 250-250-65 MG per tablet   Oral   Take 2 tablets by mouth every 6 (six) hours as needed for pain.         . diphenhydrAMINE (BENADRYL) 25 mg capsule   Oral   Take 1-2 capsules (25-50 mg total) by mouth at bedtime as needed for sleep.   30  capsule   0   . FLUoxetine (PROZAC) 10 MG capsule   Oral   Take 1 capsule (10 mg total) by mouth daily.   30 capsule   3   . ibuprofen (ADVIL,MOTRIN) 200 MG tablet   Oral   Take 400 mg by mouth daily as needed for pain.         Marland Kitchen insulin NPH-regular (NOVOLIN 70/30) (70-30) 100 UNIT/ML injection   Subcutaneous   Inject 40 Units into the skin 2 (two) times daily with a meal.   10 mL   12   . Insulin Syringe-Needle U-100 30G X 1/2" 0.5 ML MISC   Does not apply   1 Syringe by Does not apply route 4 (four) times daily -  before meals and at bedtime.   100 each   6   . oxyCODONE-acetaminophen (PERCOCET/ROXICET) 5-325 MG per tablet   Oral   Take 1-2 tablets by mouth every 6 (six) hours as needed.   15 tablet   0    BP 125/78  Temp(Src) 98.3 F (36.8 C) (Oral)  Resp 22  Ht 5\' 10"  (1.778 m)  Wt 140 lb (63.504 kg)  BMI 20.09 kg/m2  SpO2 100%  LMP 07/11/2013  Physical Exam General: Well-developed,  well-nourished female in no acute distress; appearance consistent with age of record HENT: normocephalic; atraumatic; ketotic breath; dry mucous membranes Eyes: pupils equal, round and reactive to light; extraocular muscles intact Neck: supple Heart: regular rate and rhythm; no murmurs, rubs or gallops Lungs: clear to auscultation bilaterally; kussmaul respirations   Abdomen: soft; nondistended; nontender; no masses or hepatosplenomegaly; bowel sounds present Extremities: No deformity; full range of motion; pulses normal Neurologic: Awake, alert and oriented; motor function intact in all extremities and symmetric; no facial droop Skin: Warm and dry Psychiatric: Flat affect  ED Course  Procedures (including critical care time)  DIAGNOSTIC STUDIES: Oxygen Saturation is 100% on RA, Normal by my interpretation.    COORDINATION OF CARE: 11:52 PM-Discussed treatment plan with pt at bedside and pt agreed to plan.     MDM   Nursing notes and vitals signs, including pulse  oximetry, reviewed.  Summary of this visit's results, reviewed by myself:  Labs:  Results for orders placed during the hospital encounter of 07/25/13 (from the past 24 hour(s))  GLUCOSE, CAPILLARY     Status: Abnormal   Collection Time    07/25/13 11:26 PM      Result Value Range   Glucose-Capillary 467 (*) 70 - 99 mg/dL   Comment 1 Notify RN     Comment 2 Documented in Chart    URINALYSIS, ROUTINE W REFLEX MICROSCOPIC     Status: Abnormal   Collection Time    07/25/13 11:37 PM      Result Value Range   Color, Urine YELLOW  YELLOW   APPearance CLEAR  CLEAR   Specific Gravity, Urine 1.030  1.005 - 1.030   pH 5.5  5.0 - 8.0   Glucose, UA >1000 (*) NEGATIVE mg/dL   Hgb urine dipstick TRACE (*) NEGATIVE   Bilirubin Urine NEGATIVE  NEGATIVE   Ketones, ur >80 (*) NEGATIVE mg/dL   Protein, ur 30 (*) NEGATIVE mg/dL   Urobilinogen, UA 0.2  0.0 - 1.0 mg/dL   Nitrite NEGATIVE  NEGATIVE   Leukocytes, UA NEGATIVE  NEGATIVE  PREGNANCY, URINE     Status: None   Collection Time    07/25/13 11:37 PM      Result Value Range   Preg Test, Ur NEGATIVE  NEGATIVE  URINE MICROSCOPIC-ADD ON     Status: None   Collection Time    07/25/13 11:37 PM      Result Value Range   Squamous Epithelial / LPF RARE  RARE   WBC, UA 0-2  <3 WBC/hpf   RBC / HPF 0-2  <3 RBC/hpf   Bacteria, UA RARE  RARE  BASIC METABOLIC PANEL     Status: Abnormal   Collection Time    07/26/13 12:00 AM      Result Value Range   Sodium 131 (*) 135 - 145 mEq/L   Potassium 3.3 (*) 3.5 - 5.1 mEq/L   Chloride 91 (*) 96 - 112 mEq/L   CO2 8 (*) 19 - 32 mEq/L   Glucose, Bld 468 (*) 70 - 99 mg/dL   BUN 13  6 - 23 mg/dL   Creatinine, Ser 4.09  0.50 - 1.10 mg/dL   Calcium 8.8  8.4 - 81.1 mg/dL   GFR calc non Af Amer >90  >90 mL/min   GFR calc Af Amer >90  >90 mL/min  CBC WITH DIFFERENTIAL     Status: Abnormal   Collection Time    07/26/13 12:00 AM  Result Value Range   WBC 9.6  4.0 - 10.5 K/uL   RBC 5.11  3.87 - 5.11  MIL/uL   Hemoglobin 15.4 (*) 12.0 - 15.0 g/dL   HCT 47.8  29.5 - 62.1 %   MCV 86.9  78.0 - 100.0 fL   MCH 30.1  26.0 - 34.0 pg   MCHC 34.7  30.0 - 36.0 g/dL   RDW 30.8  65.7 - 84.6 %   Platelets 244  150 - 400 K/uL   Neutrophils Relative % 77  43 - 77 %   Lymphocytes Relative 15  12 - 46 %   Monocytes Relative 7  3 - 12 %   Eosinophils Relative 0  0 - 5 %   Basophils Relative 1  0 - 1 %   Neutro Abs 7.4  1.7 - 7.7 K/uL   Lymphs Abs 1.4  0.7 - 4.0 K/uL   Monocytes Absolute 0.7  0.1 - 1.0 K/uL   Eosinophils Absolute 0.0  0.0 - 0.7 K/uL   Basophils Absolute 0.1  0.0 - 0.1 K/uL   WBC Morphology WHITE COUNT CONFIRMED ON SMEAR     Smear Review MORPHOLOGY UNREMARKABLE    POCT I-STAT 3, BLOOD GAS (G3P V)     Status: Abnormal   Collection Time    07/26/13 12:15 AM      Result Value Range   pH, Ven 7.051 (*) 7.250 - 7.300   pCO2, Ven 30.3 (*) 45.0 - 50.0 mmHg   pO2, Ven 33.0  30.0 - 45.0 mmHg   Bicarbonate 8.4 (*) 20.0 - 24.0 mEq/L   TCO2 9  0 - 100 mmol/L   O2 Saturation 42.0     Acid-base deficit 21.0 (*) 0.0 - 2.0 mmol/L   Patient temperature 98.3 F     Collection site IV START     Drawn by Nurse     Sample type VENOUS     Comment NOTIFIED PHYSICIAN    GLUCOSE, CAPILLARY     Status: Abnormal   Collection Time    07/26/13  1:21 AM      Result Value Range   Glucose-Capillary 312 (*) 70 - 99 mg/dL   [facility] to admit to [employer].   I personally performed the services described in this documentation, which was scribed in my presence.  The recorded information has been reviewed and is accurate.   Hanley Seamen, MD 07/26/13 [phone]

## 2013-07-25 NOTE — ED Notes (Addendum)
Pt c/o headache, bilat lower back pain and vomiting since 1:00pm this afternoon. Pt sts she does not check her blood sugar so does not know how long it has been high.

## 2013-07-26 DIAGNOSIS — E1110 Type 2 diabetes mellitus with ketoacidosis without coma: Secondary | ICD-10-CM

## 2013-07-26 DIAGNOSIS — E860 Dehydration: Secondary | ICD-10-CM

## 2013-07-26 DIAGNOSIS — G8929 Other chronic pain: Secondary | ICD-10-CM | POA: Diagnosis present

## 2013-07-26 DIAGNOSIS — M549 Dorsalgia, unspecified: Secondary | ICD-10-CM | POA: Diagnosis present

## 2013-07-26 DIAGNOSIS — I959 Hypotension, unspecified: Secondary | ICD-10-CM

## 2013-07-26 DIAGNOSIS — R51 Headache: Secondary | ICD-10-CM

## 2013-07-26 LAB — BASIC METABOLIC PANEL
BUN: 9 mg/dL (ref 6–23)
BUN: 8 mg/dL (ref 6–23)
BUN: 8 mg/dL (ref 6–23)
CO2: 9 mEq/L — CL (ref 19–32)
CO2: 10 mEq/L — CL (ref 19–32)
Calcium: 7.4 mg/dL — ABNORMAL LOW (ref 8.4–10.5)
Calcium: 7.5 mg/dL — ABNORMAL LOW (ref 8.4–10.5)
Calcium: 8 mg/dL — ABNORMAL LOW (ref 8.4–10.5)
Calcium: 8 mg/dL — ABNORMAL LOW (ref 8.4–10.5)
Calcium: 8.4 mg/dL (ref 8.4–10.5)
Chloride: 107 mEq/L (ref 96–112)
Chloride: 91 mEq/L — ABNORMAL LOW (ref 96–112)
Creatinine, Ser: 0.29 mg/dL — ABNORMAL LOW (ref 0.50–1.10)
Creatinine, Ser: 0.31 mg/dL — ABNORMAL LOW (ref 0.50–1.10)
Creatinine, Ser: 0.3 mg/dL — ABNORMAL LOW (ref 0.50–1.10)
Creatinine, Ser: 0.29 mg/dL — ABNORMAL LOW (ref 0.50–1.10)
Creatinine, Ser: 0.63 mg/dL (ref 0.50–1.10)
GFR calc Af Amer: >90 mL/min (ref >90)
GFR calc Af Amer: >90 mL/min (ref >90)
GFR calc Af Amer: >90 mL/min (ref >90)
GFR calc Af Amer: >90 mL/min (ref >90)
GFR calc Af Amer: >90 mL/min (ref >90)
GFR calc non Af Amer: >90 mL/min (ref >90)
GFR calc non Af Amer: >90 mL/min (ref >90)
GFR calc non Af Amer: >90 mL/min (ref >90)
GFR calc non Af Amer: >90 mL/min (ref >90)
GFR calc non Af Amer: >90 mL/min (ref >90)
GFR calc non Af Amer: >90 mL/min (ref >90)
Glucose, Bld: 175 mg/dL — ABNORMAL HIGH (ref 70–99)
Glucose, Bld: 180 mg/dL — ABNORMAL HIGH (ref 70–99)
Glucose, Bld: 182 mg/dL — ABNORMAL HIGH (ref 70–99)
Glucose, Bld: 212 mg/dL — ABNORMAL HIGH (ref 70–99)
Glucose, Bld: 468 mg/dL — ABNORMAL HIGH (ref 70–99)
Potassium: 3 mEq/L — ABNORMAL LOW (ref 3.5–5.1)
Potassium: 3.1 mEq/L — ABNORMAL LOW (ref 3.5–5.1)
Potassium: 3.2 mEq/L — ABNORMAL LOW (ref 3.5–5.1)
Potassium: 3.3 mEq/L — ABNORMAL LOW (ref 3.5–5.1)
Potassium: 3.6 mEq/L (ref 3.5–5.1)
Sodium: 131 mEq/L — ABNORMAL LOW (ref 135–145)
Sodium: 135 mEq/L (ref 135–145)
Sodium: 136 mEq/L (ref 135–145)
Sodium: 136 mEq/L (ref 135–145)
Sodium: 137 mEq/L (ref 135–145)

## 2013-07-26 LAB — CBC WITH DIFFERENTIAL/PLATELET
Basophils Relative: 1 % (ref 0–1)
Eosinophils Absolute: 0 10*3/uL (ref 0.0–0.7)
HCT: 44.4 % (ref 36.0–46.0)
Hemoglobin: 15.4 g/dL — ABNORMAL HIGH (ref 12.0–15.0)
Lymphocytes Relative: 15 % (ref 12–46)
MCHC: 34.7 g/dL (ref 30.0–36.0)
Monocytes Absolute: 0.7 10*3/uL (ref 0.1–1.0)
Monocytes Relative: 7 % (ref 3–12)
Neutrophils Relative %: 77 % (ref 43–77)
RBC: 5.11 MIL/uL (ref 3.87–5.11)
WBC: 9.6 10*3/uL (ref 4.0–10.5)

## 2013-07-26 LAB — GLUCOSE, CAPILLARY
Glucose-Capillary: 157 mg/dL — ABNORMAL HIGH (ref 70–99)
Glucose-Capillary: 188 mg/dL — ABNORMAL HIGH (ref 70–99)
Glucose-Capillary: 209 mg/dL — ABNORMAL HIGH (ref 70–99)
Glucose-Capillary: 310 mg/dL — ABNORMAL HIGH (ref 70–99)
Glucose-Capillary: 335 mg/dL — ABNORMAL HIGH (ref 70–99)
Glucose-Capillary: 133 mg/dL — ABNORMAL HIGH (ref 70–99)
Glucose-Capillary: 185 mg/dL — ABNORMAL HIGH (ref 70–99)

## 2013-07-26 LAB — CBC
HCT: 37.3 % (ref 36.0–46.0)
MCH: 30.3 pg (ref 26.0–34.0)
MCV: 85.7 fL (ref 78.0–100.0)
RBC: 4.35 MIL/uL (ref 3.87–5.11)
RDW: 14 % (ref 11.5–15.5)
WBC: 9.9 10*3/uL (ref 4.0–10.5)

## 2013-07-26 LAB — POCT I-STAT 3, VENOUS BLOOD GAS (G3P V)
Acid-base deficit: 21 mmol/L — ABNORMAL HIGH (ref 0.0–2.0)
Bicarbonate: 8.4 mEq/L — ABNORMAL LOW (ref 20.0–24.0)
O2 Saturation: 42 %
Patient temperature: 98.3
TCO2: 9 mmol/L (ref 0–100)
pO2, Ven: 33 mmHg (ref 30.0–45.0)

## 2013-07-26 LAB — MRSA PCR SCREENING: MRSA by PCR: NEGATIVE

## 2013-07-26 MED ORDER — ASPIRIN-ACETAMINOPHEN-CAFFEINE 250-250-65 MG PO TABS
2.0000 | ORAL_TABLET | Freq: Four times a day (QID) | ORAL | Status: DC | PRN
  Filled 2013-07-26: qty 2

## 2013-07-26 MED ORDER — SODIUM CHLORIDE 0.9 % IV BOLUS (SEPSIS)
500.0000 mL | Freq: Once | INTRAVENOUS | Status: AC
  Administered 2013-07-26: 500 mL via INTRAVENOUS

## 2013-07-26 MED ORDER — SODIUM CHLORIDE 0.9 % IV SOLN
INTRAVENOUS | Status: DC
  Administered 2013-07-26: 15:00:00 via INTRAVENOUS

## 2013-07-26 MED ORDER — DEXTROSE 50 % IV SOLN: 25.0000 mL | INTRAVENOUS | Status: DC | PRN

## 2013-07-26 MED ORDER — KETOROLAC TROMETHAMINE 15 MG/ML IJ SOLN
15.0000 mg | Freq: Four times a day (QID) | INTRAMUSCULAR | Status: DC | PRN
  Administered 2013-07-26: 15 mg via INTRAVENOUS
  Filled 2013-07-26 (×2): qty 1

## 2013-07-26 MED ORDER — PANTOPRAZOLE SODIUM 40 MG IV SOLR
40.0000 mg | INTRAVENOUS | Status: DC
  Administered 2013-07-26: 40 mg via INTRAVENOUS
  Filled 2013-07-26 (×3): qty 40

## 2013-07-26 MED ORDER — INSULIN ASPART 100 UNIT/ML ~~LOC~~ SOLN
0.0000 [IU] | Freq: Three times a day (TID) | SUBCUTANEOUS | Status: DC
  Administered 2013-07-27: 8 [IU] via SUBCUTANEOUS
  Administered 2013-07-27: 5 [IU] via SUBCUTANEOUS

## 2013-07-26 MED ORDER — INSULIN REGULAR HUMAN 100 UNIT/ML IJ SOLN
INTRAMUSCULAR | Status: AC
  Filled 2013-07-26: qty 1

## 2013-07-26 MED ORDER — ENOXAPARIN SODIUM 40 MG/0.4ML ~~LOC~~ SOLN
40.0000 mg | SUBCUTANEOUS | Status: DC
  Administered 2013-07-26 – 2013-07-27 (×2): 40 mg via SUBCUTANEOUS
  Filled 2013-07-26 (×3): qty 0.4

## 2013-07-26 MED ORDER — FENTANYL CITRATE 0.05 MG/ML IJ SOLN
INTRAMUSCULAR | Status: AC
  Filled 2013-07-26: qty 2

## 2013-07-26 MED ORDER — ONDANSETRON HCL 4 MG/2ML IJ SOLN: 4.0000 mg | Freq: Three times a day (TID) | INTRAMUSCULAR | Status: DC | PRN

## 2013-07-26 MED ORDER — FENTANYL CITRATE 0.05 MG/ML IJ SOLN
100.0000 ug | Freq: Once | INTRAMUSCULAR | Status: AC
  Administered 2013-07-26: 100 ug via INTRAVENOUS

## 2013-07-26 MED ORDER — HYDROCODONE-ACETAMINOPHEN 7.5-325 MG/15ML PO SOLN
10.0000 mL | ORAL | Status: DC | PRN
  Administered 2013-07-26: 10 mL via ORAL
  Filled 2013-07-26: qty 15

## 2013-07-26 MED ORDER — INSULIN GLARGINE 100 UNIT/ML ~~LOC~~ SOLN
30.0000 [IU] | Freq: Once | SUBCUTANEOUS | Status: AC
  Administered 2013-07-26: 30 [IU] via SUBCUTANEOUS
  Filled 2013-07-26: qty 0.3

## 2013-07-26 MED ORDER — ONDANSETRON HCL 4 MG/2ML IJ SOLN: 4.0000 mg | Freq: Three times a day (TID) | INTRAMUSCULAR | Status: AC | PRN

## 2013-07-26 MED ORDER — INSULIN ASPART 100 UNIT/ML ~~LOC~~ SOLN
0.0000 [IU] | Freq: Every day | SUBCUTANEOUS | Status: DC
  Administered 2013-07-26: 3 [IU] via SUBCUTANEOUS

## 2013-07-26 MED ORDER — SODIUM BICARBONATE 8.4 % IV SOLN
INTRAVENOUS | Status: DC
  Administered 2013-07-26: 07:00:00 via INTRAVENOUS
  Filled 2013-07-26 (×2): qty 150

## 2013-07-26 MED ORDER — POTASSIUM CHLORIDE CRYS ER 20 MEQ PO TBCR
40.0000 meq | EXTENDED_RELEASE_TABLET | ORAL | Status: AC
  Administered 2013-07-26 (×2): 40 meq via ORAL
  Filled 2013-07-26 (×3): qty 2

## 2013-07-26 MED ORDER — POTASSIUM CHLORIDE 10 MEQ/100ML IV SOLN
10.0000 meq | INTRAVENOUS | Status: AC
  Administered 2013-07-26 (×3): 10 meq via INTRAVENOUS
  Filled 2013-07-26 (×3): qty 100

## 2013-07-26 MED ORDER — INSULIN GLARGINE 100 UNIT/ML ~~LOC~~ SOLN
15.0000 [IU] | SUBCUTANEOUS | Status: AC
  Administered 2013-07-26: 15 [IU] via SUBCUTANEOUS
  Filled 2013-07-26: qty 0.15

## 2013-07-26 MED ORDER — SODIUM CHLORIDE 0.9 % IV SOLN
INTRAVENOUS | Status: AC
  Administered 2013-07-26: 02:00:00 via INTRAVENOUS

## 2013-07-26 MED ORDER — DEXTROSE-NACL 5-0.45 % IV SOLN
INTRAVENOUS | Status: DC
  Administered 2013-07-26 (×2): via INTRAVENOUS

## 2013-07-26 MED ORDER — INSULIN GLARGINE 100 UNIT/ML ~~LOC~~ SOLN
45.0000 [IU] | Freq: Every day | SUBCUTANEOUS | Status: DC
  Filled 2013-07-26: qty 0.45

## 2013-07-26 MED ORDER — SODIUM CHLORIDE 0.9 % IV SOLN
INTRAVENOUS | Status: DC
  Filled 2013-07-26: qty 1

## 2013-07-26 NOTE — Progress Notes (Addendum)
CRITICAL VALUE ALERT  Critical value received:  CO2 10  Date of notification:  07/26/2013  Time of notification:  0740  Critical value read back:yes  Nurse who received alert:  Nicole Cella, RN   MD notified (1st page):  McClung  Time of first page:  [phone]  MD notified (2nd page):  Time of second page:  Responding MD:  Riswan  Time MD responded:  [phone]

## 2013-07-26 NOTE — H&P (Signed)
[facility] History and Physical  Patient: Julie [name]  UJW:[id number]  DOB: 29-Apr-1991  DOA: 07/25/2013  Referring physician: Dr. Read Drivers PCP: Pcp Not In System  Consults:     Chief Complaint: Vomiting  HPI: Julie [name] is a 22 y.o. female with Past medical history of diabetes mellitus who is noncompliant to insulin. The patient presented with the complaint of nausea and on vomiting this started around afternoon. She mentions that until yesterday she was at her baseline and did not have any complain she denied any complaint of fever chills chest pain shortness of breath cough burning urination or diarrhea or leg swelling she mentions that she has not been using her insulin regularly and appropriately since this morning she has been feeling weak and lethargic associated with nausea and in the afternoon she started having vomiting and abdominal pain currently her abdominal pain appears to her resolved but she continues to have back pain and headache.  Review of Systems: as mentioned in the history of present illness.  A Comprehensive review of the other systems is negative.  Past Medical History  Diagnosis Date  . Diabetes mellitus without complication   . Noncompliance with diabetes treatment    Past Surgical History  Procedure Laterality Date  . Cesarean section    . Cesarean section  2012   Social History:  reports that she has never smoked. She does not have any smokeless tobacco history on file. She reports that she does not drink alcohol or use illicit drugs. Patient is coming from home. Independent for most of her  ADL.  Not on File  Family History  Problem Relation Age of Onset  . Diabetes Mother     Prior to Admission medications   Medication Sig Start Date End Date Taking? Authorizing Provider  aspirin-acetaminophen-caffeine (EXCEDRIN MIGRAINE) [phone] MG per tablet Take 2 tablets by mouth every 6 (six) hours as needed for pain.     Historical Provider, MD  diphenhydrAMINE (BENADRYL) 25 mg capsule Take 1-2 capsules (25-50 mg total) by mouth at bedtime as needed for sleep. 06/25/13   Tora Kindred York, PA-C  FLUoxetine (PROZAC) 10 MG capsule Take 1 capsule (10 mg total) by mouth daily. 06/25/13   Tora Kindred York, PA-C  ibuprofen (ADVIL,MOTRIN) 200 MG tablet Take 400 mg by mouth daily as needed for pain.    Historical Provider, MD  insulin NPH-regular (NOVOLIN 70/30) (70-30) 100 UNIT/ML injection Inject 40 Units into the skin 2 (two) times daily with a meal. 06/25/13   Tora Kindred York, PA-C  Insulin Syringe-Needle U-100 30G X 1/2" 0.5 ML MISC 1 Syringe by Does not apply route 4 (four) times daily -  before meals and at bedtime. 06/25/13   Stephani Police, PA-C  oxyCODONE-acetaminophen (PERCOCET/ROXICET) 5-325 MG per tablet Take 1-2 tablets by mouth every 6 (six) hours as needed. 06/24/13   Meredeth Ide, MD    Physical Exam: Filed Vitals:   07/25/13 2327 07/26/13 0108 07/26/13 0328  BP: 125/78 113/65 107/62  Pulse: 93 96 82  Temp: 98.3 F (36.8 C) 97.9 F (36.6 C) 98.7 F (37.1 C)  TempSrc: Oral Oral Oral  Resp: 22 22   Height: 5\' 10"  (1.778 m)  5\' 10"  (1.778 m)  Weight: 63.504 kg (140 lb)  66.4 kg (146 lb 6.2 oz)  SpO2: 100% 100% 100%    General: Alert, Awake and Oriented to Time, Place and Person. Appear in mild distress Eyes: PERRL ENT: Oral Mucosa clear dry. Neck:  no JVD, no Carotid Bruits  Cardiovascular: S1 and S2 Present, no Murmur, Peripheral Pulses Present Respiratory: Bilateral Air entry equal and Decreased, Clear to Auscultation,  no Crackles,no wheezes Abdomen: Bowel Sound Present, Soft and Non tender Skin: no Rash Extremities: no Pedal edema, no calf tenderness Neurologic: Grossly Unremarkable.  Labs on Admission:  CBC:  Recent Labs Lab 07/26/13  WBC 9.6  NEUTROABS 7.4  HGB 15.4*  HCT 44.4  MCV 86.9  PLT 244    CMP     Component Value Date/Time   NA 131* 07/26/2013 0000   K 3.3* 07/26/2013  0000   CL 91* 07/26/2013 0000   CO2 8* 07/26/2013 0000   GLUCOSE 468* 07/26/2013 0000   BUN 13 07/26/2013 0000   CREATININE 0.50 07/26/2013 0000   CALCIUM 8.8 07/26/2013 0000   PROT 7.6 06/21/2013 2225   ALBUMIN 3.6 06/21/2013 2225   AST <5 06/21/2013 2225   ALT 6 06/21/2013 2225   ALKPHOS 107 06/21/2013 2225   BILITOT 0.1* 06/21/2013 2225   GFRNONAA >90 07/26/2013 0000   GFRAA >90 07/26/2013 0000    Assessment/Plan Principal Problem:   DKA, type 1 Active Problems:   Hypotension   H/O medication noncompliance   Headache(784.0)   1. DKA, type 1 The patient presents with significant metabolic acidosis with anion gap of 32 at [facility]. She has been given IV insulin bolus and then has been started on glucose stabilizer. She has also been given 2 L of IV fluid. Most likely cause of her going to DKA is noncompliant with insulin. She does not have any signs of infection as well. Currently her blood sugars are running in 200 range. A BMP is still pending. I will continue her on IV insulin. Considering her low blood pressures I will give her 500 cc of bolus. Her fluid will be changed according to the protocol for DKA. She remains n.p.o. IV potassium supplementation will also be given.  2. Back pain Patient has been on Percocet in the past at present I will give her Norco as needed. We'll try to avoid IV medications in presence of hypotension.   DVT Prophylaxis: subcutaneous Heparin Nutrition: N.p.o.  Code Status: Full  Disposition: Admitted to inpatient in step-down unit.  Author: Lynden Oxford, MD [facility] Pager: [phone] 07/26/2013, 4:17 AM    If 7PM-7AM, please contact night-coverage [email or web address] Password [email or web address]

## 2013-07-26 NOTE — Progress Notes (Signed)
[facility] Progress Note [place] TEAM 1 - Stepdown ICU Team   Julie [name] ZOX:[id number] DOB: [birth year] DOA: 07/25/2013 PCP: Pcp Not In System  Brief narrative: 22 y.o. female with Past medical history of diabetes mellitus who is noncompliant to insulin. The patient presented with complaints of nausea and vomiting which started on the AM of admission. She mentioned that until the day prior to admit she was at her baseline and did not have any complaints. She denied any complaints of fever, chills, chest pain, shortness of breath, cough, burning upon urination, diarrhea or leg swelling. She mentioned that she had not been using her insulin regularly. She had been feeling weak and lethargic and this was associated with nausea. In the afternoon of admission she started having vomiting and abdominal pain. Upon evaluation by the admitting MD she endorsed back pain and headache.   Assessment/Plan: Active Problems:   DKA, type 1  -AG still open -continue Insulin gtt but will bolster response by giving 1x dose Lantus -dc bicarb drip  **AG down to 10 @ 233 pm- will give add'l Lantus now (was on 45 units 70/30 in past) then begin 45 units HS 11/7--add SSI and check HgbA1c    Hypotension/Dehydration -continue hydration -BP still soft    Back pain -low dose Toradol    Hypokalemia -replete and follow lytes    H/O medication noncompliance  -seems at this time to be mediated by lack of insurance and inability to procur meds -CM consulted- she has no PCP not any way to pay for MD visits- [facility]   DVT prophylaxis: Lovenox Code Status: Full Family Communication: Patient Disposition Plan/Expected LOS: Stepdown Isolation: None Nutritional Status: Acute protein calorie malnutrition related to DKA  Consultants: None  Procedures: None  Antibiotics: None  HPI/Subjective: Patient alert complaining of back pain and hunger. No  chest pain or shortness of breath no dysuria or cough or fevers  Objective: Blood pressure 96/56, pulse 92, temperature 98.4 F (36.9 C), temperature source Axillary, resp. rate 15, height 5\' 10"  (1.778 m), weight 146 lb 6.2 oz (66.4 kg), last menstrual period 07/11/2013, SpO2 99.00%.  Intake/Output Summary (Last 24 hours) at 07/26/13 1259 Last data filed at 07/26/13 1100  Gross per 24 hour  Intake 1699.58 ml  Output      0 ml  Net 1699.58 ml     Exam: Followup exam completed. Patient admitted at or 17 a.m. today  Scheduled Meds:  Scheduled Meds: . sodium chloride   Intravenous STAT  . enoxaparin (LOVENOX) injection  40 mg Subcutaneous Q24H  . pantoprazole (PROTONIX) IV  40 mg Intravenous Q24H  . potassium chloride  40 mEq Oral Q4H   Continuous Infusions: . sodium chloride Stopped (07/26/13 0530)  . dextrose 5 % and 0.45% NaCl 125 mL/hr at 07/26/13 0800  . insulin (NOVOLIN-R) infusion 10 Units/hr (07/26/13 1155)    **Reviewed in detail by the Attending Physician  Data Reviewed: Basic Metabolic Panel:  Recent Labs Lab 07/26/13 07/26/13 0430 07/26/13 0602 07/26/13 0915  NA 131* 136 136 134*  K 3.3* 3.0* 3.1* 3.2*  CL 91* 107 108 107  CO2 8* 9* 10* 11*  GLUCOSE 468* 180* 182* 212*  BUN 13 9 8 8   CREATININE 0.50 0.29* 0.31* 0.30*  CALCIUM 8.8 7.2* 7.4* 7.5*   Liver Function Tests: No results found for this basename: AST, ALT, ALKPHOS, BILITOT, PROT, ALBUMIN,  in the last 168 hours No results found  for this basename: LIPASE, AMYLASE,  in the last 168 hours No results found for this basename: AMMONIA,  in the last 168 hours CBC:  Recent Labs Lab 07/26/13 07/26/13 0602  WBC 9.6 9.9  NEUTROABS 7.4  --   HGB 15.4* 13.2  HCT 44.4 37.3  MCV 86.9 85.7  PLT 244 199   Cardiac Enzymes: No results found for this basename: CKTOTAL, CKMB, CKMBINDEX, TROPONINI,  in the last 168 hours BNP (last 3 results) No results found for this basename: PROBNP,  in the last 8760  hours CBG:  Recent Labs Lab 07/26/13 0738 07/26/13 0911 07/26/13 1013 07/26/13 1023 07/26/13 1153  GLUCAP 179* 188* 203* 209* 310*    Recent Results (from the past 240 hour(s))  MRSA PCR SCREENING     Status: None   Collection Time    07/26/13  6:15 AM      Result Value Range Status   MRSA by PCR NEGATIVE  NEGATIVE Final   Comment:            The GeneXpert MRSA Assay (FDA     approved for NASAL specimens     only), is one component of a     comprehensive MRSA colonization     surveillance program. It is not     intended to diagnose MRSA     infection nor to guide or     monitor treatment for     MRSA infections.     Studies:  Recent x-ray studies have been reviewed in detail by the Attending Physician     Junious Silk, ANP [facility] Office  [phone] Pager [phone]  **If unable to reach the above provider after paging please contact the Flow Manager @ [phone]  On-Call/Text Page:      [email or web address]      password [email or web address]  If 7PM-7AM, please contact night-coverage [email or web address] Password [facility] 07/26/2013, 12:59 PM   LOS: 1 day   I have examined the patient, reviewed the chart and modified the above note which I agree with.   Shayon Trompeter,MD [phone] 07/26/2013, 4:27 PM

## 2013-07-26 NOTE — Progress Notes (Signed)
Critical lab value called from lab at 0740.  CO2: 10.  Dr Alphonsa Gin text paged with result at [phone].   Nicole Cella, RN

## 2013-07-26 NOTE — Progress Notes (Signed)
Spoke with patient about her diabetes.  States that she takes 70/30 mixed insulin 45 units twice a day at home.  Has not been taking her insulin recently due to cost and not having a job.  Case management to see patient regarding medication affordability and to be able to see a physician at discharge.  Continues to be on glucostabilizer until anion gap is closed.  Eating small amount of liquid diet at this time.  Will continue to follow while in hospital.  Smith Mince RN BSN CDE

## 2013-07-26 NOTE — Progress Notes (Signed)
Pt transferred from [facility] with dx of DKA, pt is alert x 4 and on insulin drip. Will continue to monitor pt---Lasonia Casino, rn

## 2013-07-26 NOTE — ED Notes (Signed)
Pt requested additional pain med.  MD declined to order at this time. Pt aware.

## 2013-07-27 DIAGNOSIS — F329 Major depressive disorder, single episode, unspecified: Secondary | ICD-10-CM

## 2013-07-27 DIAGNOSIS — F3289 Other specified depressive episodes: Secondary | ICD-10-CM

## 2013-07-27 DIAGNOSIS — E876 Hypokalemia: Secondary | ICD-10-CM

## 2013-07-27 LAB — BASIC METABOLIC PANEL
BUN: 7 mg/dL (ref 6–23)
CO2: 19 mEq/L (ref 19–32)
Calcium: 7.9 mg/dL — ABNORMAL LOW (ref 8.4–10.5)
Calcium: 7.8 mg/dL — ABNORMAL LOW (ref 8.4–10.5)
Chloride: 109 mEq/L (ref 96–112)
Chloride: 111 mEq/L (ref 96–112)
Creatinine, Ser: 0.28 mg/dL — ABNORMAL LOW (ref 0.50–1.10)
Creatinine, Ser: 0.25 mg/dL — ABNORMAL LOW (ref 0.50–1.10)
GFR calc Af Amer: >90 mL/min (ref >90)
GFR calc non Af Amer: >90 mL/min (ref >90)
Glucose, Bld: 247 mg/dL — ABNORMAL HIGH (ref 70–99)
Sodium: 136 mEq/L (ref 135–145)
Sodium: 137 mEq/L (ref 135–145)

## 2013-07-27 LAB — GLUCOSE, CAPILLARY
Glucose-Capillary: 221 mg/dL — ABNORMAL HIGH (ref 70–99)
Glucose-Capillary: 252 mg/dL — ABNORMAL HIGH (ref 70–99)

## 2013-07-27 LAB — CBC
HCT: 32.7 % — ABNORMAL LOW (ref 36.0–46.0)
MCH: 29.8 pg (ref 26.0–34.0)
MCHC: 34.9 g/dL (ref 30.0–36.0)
MCV: 85.4 fL (ref 78.0–100.0)
RDW: 14.5 % (ref 11.5–15.5)

## 2013-07-27 LAB — HEMOGLOBIN A1C
Hgb A1c MFr Bld: 15 % — ABNORMAL HIGH (ref <5.7)
Mean Plasma Glucose: 384 mg/dL — ABNORMAL HIGH (ref <117)

## 2013-07-27 MED ORDER — INSULIN GLARGINE 100 UNIT/ML ~~LOC~~ SOLN
20.0000 [IU] | SUBCUTANEOUS | Status: AC
  Administered 2013-07-27: 20 [IU] via SUBCUTANEOUS
  Filled 2013-07-27: qty 0.2

## 2013-07-27 MED ORDER — INSULIN GLARGINE 100 UNIT/ML ~~LOC~~ SOLN: 45.0000 [IU] | Freq: Every day | SUBCUTANEOUS | Status: DC

## 2013-07-27 MED ORDER — INSULIN GLARGINE 100 UNIT/ML SOLOSTAR PEN: 55.0000 [IU] | PEN_INJECTOR | Freq: Every day | SUBCUTANEOUS | Status: DC

## 2013-07-27 MED ORDER — INSULIN PEN NEEDLE 30G X 8 MM MISC: 1.0000 | Status: DC | PRN

## 2013-07-27 MED ORDER — INSULIN ASPART 100 UNIT/ML FLEXPEN: 12.0000 [IU] | PEN_INJECTOR | Freq: Three times a day (TID) | SUBCUTANEOUS | Status: DC

## 2013-07-27 MED ORDER — PANTOPRAZOLE SODIUM 40 MG PO TBEC
40.0000 mg | DELAYED_RELEASE_TABLET | Freq: Every day | ORAL | Status: DC
  Administered 2013-07-27: 40 mg via ORAL
  Filled 2013-07-27: qty 1

## 2013-07-27 MED ORDER — POTASSIUM CHLORIDE CRYS ER 20 MEQ PO TBCR
40.0000 meq | EXTENDED_RELEASE_TABLET | ORAL | Status: AC
  Administered 2013-07-27 (×2): 40 meq via ORAL
  Filled 2013-07-27 (×2): qty 2

## 2013-07-27 NOTE — Care Management Note (Signed)
    Page 1 of 1   07/27/2013     1:20:45 PM   CARE MANAGEMENT NOTE 07/27/2013  Patient:  Julie [name], Julie [name]   Account Number:  [id number]  Date Initiated:  07/27/2013  Documentation initiated by:  Bedelia Pong  Subjective/Objective Assessment:   adm with dx of DKA; lives with family     DC Planning Services  CM consult  Follow-up appt scheduled  MATCH Program  Medication Assistance      Status of service:  Completed, signed off  Per UR Regulation:  Reviewed for med. necessity/level of care/duration of stay  Comments:  07/27/13 1234 Mahdi Frye RN MSN BSN CCM Pt states she lost her job 3 weeks ago, no one else in her household works.  Has not been taking her insulin as she can't afford same, has no PCP.  Pharmaceutical patient assistance application for Lantus faxed to [employer], pt given savings card for Novolog-she will call to provide information and activate.  Appointment arranged with [facility] for Friday, 11/14 @ 9 a.m.

## 2013-07-27 NOTE — Progress Notes (Signed)
Patient discharge teaching performed at this time.  Patient is being discharged in a stable medical condition. Appropriate community resources have been put in place, follow up doctor's appointments scheduled, and prescriptions to be filled after discharge have been given to the patient.  Discharged instructions are signed by the patient and nurse.

## 2013-07-27 NOTE — Progress Notes (Signed)
Provided pt with MATCH medication assistance card so that she can purchase insulins and pens for $3 each Rx.

## 2013-07-27 NOTE — Discharge Summary (Signed)
Physician Discharge Summary  Julie [name] RUE:[id number] DOB: 1991/06/16 DOA: 07/25/2013  PCP: Pcp Not In System  Admit date: 07/25/2013 Discharge date: 07/27/2013  Time spent: 45 minutes (830 am to 915 am)  Recommendations for Outpatient Follow-up:  1. Follow up with [facility] for scheduled appointment 08/03/13  Discharge Diagnoses:  Active Problems:   DKA, type 1 due to non-compliance   Hypotension/Dehydration-resolved   Back pain-resolved   Hypokalemia   H/O medication noncompliance   Discharge Condition: stable  Diet recommendation: CARBOHYDRATE MODIFIED  Filed Weights   07/25/13 2327 07/26/13 0328  Weight: 140 lb (63.504 kg) 146 lb 6.2 oz (66.4 kg)    History of present illness:  22 y.o. female with Past medical history of diabetes mellitus who is noncompliant to insulin. The patient presented with complaints of nausea and vomiting which started on the AM of admission. She mentioned that until the day prior to admit she was at her baseline and did not have any complaints. She denied any complaints of fever, chills, chest pain, shortness of breath, cough, burning upon urination, diarrhea or leg swelling. She mentioned that she had not been using her insulin regularly. She had been feeling weak and lethargic and this was associated with nausea. In the afternoon of admission she started having vomiting and abdominal pain. Upon evaluation by the admitting MD she endorsed back pain and headache   Hospital Course:  DKA, type 1 diabetes -AG markedly elevated at admission but eventually closed and she was transitioned to SQ insulin - Pt has had 4 hospital visits since June and has not had outpt f/u despite multiple recommendations to do so -Discussed with pt importance of medication compliance and post hospital follow up. MD informed pt that she must take medications to avoid re-admits to hospital.  Hypotension/Dehydration  -resolved with hydration and  correction of hyperglycemia  Back pain  -tx'd effectively with low dose Toradol   Hypokalemia  -resolved   H/O medication noncompliance  -seems at this time to be mediated by lack of insurance and lack of outpt f/u - pt had an appt for [facility] wellness center after June admission but skipped the appt.  -CM consulted- pt setup with [facility] program- will discharge on Lantus Solostar pen 55 units daily and Novolog pen 12 units TID -appointment also arranged for this patient at the [facility] - pt strictly advised to follow up with Wellness clinic- has appt with myself for 11/14.    Bulemia -pt admitted to nutritionist just prior to discharge she is bulemic -pt has had multiple psych evaluations during previous admits but has not attneded any of her scheduled OP follow up -after discharge she needs to go to [facility] to establish for treatment of her bulemia or in the least locate an appropriate support group   Procedures:  None  Consultations:  None  Discharge Exam: Filed Vitals:   07/27/13 1111  BP: 100/56  Pulse: 69  Temp: 98.3 F (36.8 C)  Resp: 23   General: No acute respiratory distress Lungs: Clear to auscultation bilaterally without wheezes or crackles, RA Cardiovascular: Regular rate and rhythm without murmur gallop or rub normal S1 and S2, no peripheral edema or JVD Abdomen: Nontender, nondistended, soft, bowel sounds positive, no rebound, no ascites, no appreciable mass Musculoskeletal: No significant cyanosis, clubbing of bilateral lower extremities Neurological: Alert and oriented x 3, moves all extremities x 4 without focal neurological deficits, CN 2-12 intact   Discharge Instructions  Discharge Orders   Future Appointments Provider Department Dept Phone   08/03/2013 9:00 AM [facility] [phone]   Future Orders Complete By Expires   Diet Carb Modified  As directed    Increase activity  slowly  As directed        Medication List    STOP taking these medications       insulin aspart protamine- aspart (70-30) 100 UNIT/ML injection  Commonly known as:  NOVOLOG MIX 70/30      TAKE these medications       aspirin-acetaminophen-caffeine 250-250-65 MG per tablet  Commonly known as:  EXCEDRIN MIGRAINE  Take 2 tablets by mouth every 6 (six) hours as needed for pain.     insulin aspart 100 UNIT/ML Sopn FlexPen  Commonly known as:  NOVOLOG FLEXPEN  Inject 12 Units into the skin 3 (three) times daily with meals.     Insulin Glargine 100 UNIT/ML Sopn  Inject 55 Units into the skin at bedtime.     Insulin Pen Needle 30G X 8 MM Misc  Commonly known as:  NOVOFINE  Inject 10 each into the skin as needed.     Insulin Pen Needle 30G X 8 MM Misc  Commonly known as:  NOVOFINE  Inject 10 each into the skin as needed.       No Known Allergies Follow-up Information   Follow up with [facility]     On 08/03/2013. (At 9 am- Please attend this appointment- you need close follow up to avoid future hospital admissions)    Contact information:   [address] [place] [address] [phone]       The results of significant diagnostics from this hospitalization (including imaging, microbiology, ancillary and laboratory) are listed below for reference.    Significant Diagnostic Studies: No results found.  Microbiology: Recent Results (from the past 240 hour(s))  MRSA PCR SCREENING     Status: None   Collection Time    07/26/13  6:15 AM      Result Value Range Status   MRSA by PCR NEGATIVE  NEGATIVE Final   Comment:            The GeneXpert MRSA Assay (FDA     approved for NASAL specimens     only), is one component of a     comprehensive MRSA colonization     surveillance program. It is not     intended to diagnose MRSA     infection nor to guide or     monitor treatment for     MRSA infections.     Labs: Basic  Metabolic Panel:  Recent Labs Lab 07/26/13 1330 07/26/13 1810 07/26/13 2158 07/27/13 0122 07/27/13 0555  NA 133* 135 137 136 137  K 3.0* 3.2* 3.6 3.5 3.2*  CL 106 109 108 109 111  CO2 17* 17* 17* 19 18*  GLUCOSE 175* 147* 300* 275* 247*  BUN 6 9 11 8 7   CREATININE 0.29* 0.63 0.41* 0.28* 0.25*  CALCIUM 8.0* 8.0* 8.4 7.9* 7.8*  MG  --   --   --   --  1.6   Liver Function Tests: No results found for this basename: AST, ALT, ALKPHOS, BILITOT, PROT, ALBUMIN,  in the last 168 hours No results found for this basename: LIPASE, AMYLASE,  in the last 168 hours No results found for this basename: AMMONIA,  in the last 168 hours CBC:  Recent Labs Lab 07/26/13 07/26/13 0602 07/27/13 0555  WBC 9.6 9.9 4.0  NEUTROABS 7.4  --   --   HGB 15.4* 13.2 11.4*  HCT 44.4 37.3 32.7*  MCV 86.9 85.7 85.4  PLT 244 199 165   Cardiac Enzymes: No results found for this basename: CKTOTAL, CKMB, CKMBINDEX, TROPONINI,  in the last 168 hours BNP: BNP (last 3 results) No results found for this basename: PROBNP,  in the last 8760 hours CBG:  Recent Labs Lab 07/26/13 1517 07/26/13 1720 07/26/13 2134 07/27/13 0803 07/27/13 1115  GLUCAP 133* 185* 298* 221* 252*       Signed:  ELLIS,ALLISON L. ANP [facility] 07/27/2013, 2:34 PM   I have examined the patient, reviewed the chart and modified the above note which I agree with.   Raylan Troiani,MD [phone] 07/27/2013, 4:48 PM

## 2013-08-03 ENCOUNTER — Ambulatory Visit: Payer: Medicaid Other | Attending: Internal Medicine | Admitting: Internal Medicine

## 2013-08-03 VITALS — BP 103/68 | HR 70 | Temp 97.7°F | Resp 17 | Ht 70.0 in | Wt 158.4 lb

## 2013-08-03 DIAGNOSIS — F502 Bulimia nervosa: Secondary | ICD-10-CM

## 2013-08-03 MED ORDER — NICOTINE 14 MG/24HR TD PT24: 14.0000 mg | MEDICATED_PATCH | Freq: Every day | TRANSDERMAL | Status: DC

## 2013-08-03 NOTE — Progress Notes (Unsigned)
Patient here for follow up from hospital-DM Was admitted for 5 days

## 2013-08-03 NOTE — Progress Notes (Unsigned)
Patient ID: Julie [name], female   DOB: 26-Jan-1991, 22 y.o.   MRN: [id number]  CC:  HPI: 22 year old female presents for hospital followup She has been noncompliant with her insulin. She denies any nausea vomiting abdominal pain. She states that her appetite has been great and she feels like she has gained 15 pounds since her discharge. She denies any lethargy. She has been measuring her CBG. This morning it was 116. She does not have a record that I can review at this clinic today with her  She admits to feeling anxious she admits to smoking.  No Known Allergies Past Medical History  Diagnosis Date  . Diabetes mellitus without complication   . Noncompliance with diabetes treatment    Current Outpatient Prescriptions on File Prior to Visit  Medication Sig Dispense Refill  . insulin aspart (NOVOLOG FLEXPEN) 100 UNIT/ML SOPN FlexPen Inject 12 Units into the skin 3 (three) times daily with meals.  1 pen  12  . Insulin Glargine 100 UNIT/ML SOPN Inject 55 Units into the skin at bedtime.  1 pen  12  . aspirin-acetaminophen-caffeine (EXCEDRIN MIGRAINE) 250-250-65 MG per tablet Take 2 tablets by mouth every 6 (six) hours as needed for pain.      . Insulin Pen Needle (NOVOFINE) 30G X 8 MM MISC Inject 10 each into the skin as needed.  100 each  6  . Insulin Pen Needle (NOVOFINE) 30G X 8 MM MISC Inject 10 each into the skin as needed.  100 each  0   No current facility-administered medications on file prior to visit.   Family History  Problem Relation Age of Onset  . Diabetes Mother    History   Social History  . Marital Status: Legally Separated    Spouse Name: N/A    Number of Children: N/A  . Years of Education: N/A   Occupational History  . Not on file.   Social History Main Topics  . Smoking status: Former Smoker    Types: Cigarettes  . Smokeless tobacco: Not on file  . Alcohol Use: No  . Drug Use: No  . Sexual Activity: Yes    Birth Control/ Protection: None    Other Topics Concern  . Not on file   Social History Narrative  . No narrative on file    Review of Systems  Constitutional: Negative for fever, chills, diaphoresis, activity change, appetite change and fatigue.  HENT: Negative for ear pain, nosebleeds, congestion, facial swelling, rhinorrhea, neck pain, neck stiffness and ear discharge.   Eyes: Negative for pain, discharge, redness, itching and visual disturbance.  Respiratory: Negative for cough, choking, chest tightness, shortness of breath, wheezing and stridor.   Cardiovascular: Negative for chest pain, palpitations and leg swelling.  Gastrointestinal: Negative for abdominal distention.  Genitourinary: Negative for dysuria, urgency, frequency, hematuria, flank pain, decreased urine volume, difficulty urinating and dyspareunia.  Musculoskeletal: Negative for back pain, joint swelling, arthralgias and gait problem.  Neurological: Negative for dizziness, tremors, seizures, syncope, facial asymmetry, speech difficulty, weakness, light-headedness, numbness and headaches.  Hematological: Negative for adenopathy. Does not bruise/bleed easily.  Psychiatric/Behavioral: Negative for hallucinations, behavioral problems, confusion, dysphoric mood, decreased concentration and agitation.    Objective:   Filed Vitals:   08/03/13 0952  BP: 103/68  Pulse: 70  Temp: 97.7 F (36.5 C)  Resp: 17    Physical Exam  Constitutional: Appears well-developed and well-nourished. No distress.  HENT: Normocephalic. External right and left ear normal. Oropharynx is clear and moist.  Eyes: Conjunctivae and EOM are normal. PERRLA, no scleral icterus.  Neck: Normal ROM. Neck supple. No JVD. No tracheal deviation. No thyromegaly.  CVS: RRR, S1/S2 +, no murmurs, no gallops, no carotid bruit.  Pulmonary: Effort and breath sounds normal, no stridor, rhonchi, wheezes, rales.  Abdominal: Soft. BS +,  no distension, tenderness, rebound or guarding.   Musculoskeletal: Normal range of motion. No edema and no tenderness.  Lymphadenopathy: No lymphadenopathy noted, cervical, inguinal. Neuro: Alert. Normal reflexes, muscle tone coordination. No cranial nerve deficit. Skin: Skin is warm and dry. No rash noted. Not diaphoretic. No erythema. No pallor.  Psychiatric: Normal mood and affect. Behavior, judgment, thought content normal.   Lab Results  Component Value Date   WBC 4.0 07/27/2013   HGB 11.4* 07/27/2013   HCT 32.7* 07/27/2013   MCV 85.4 07/27/2013   PLT 165 07/27/2013   Lab Results  Component Value Date   CREATININE 0.25* 07/27/2013   BUN 7 07/27/2013   NA 137 07/27/2013   K 3.2* 07/27/2013   CL 111 07/27/2013   CO2 18* 07/27/2013    Lab Results  Component Value Date   HGBA1C 15.0* 07/26/2013   Lipid Panel  No results found for this basename: chol, trig, hdl, cholhdl, vldl, ldlcalc       Assessment and plan:   Patient Active Problem List   Diagnosis Date Noted  . Back pain 07/26/2013  . Headache(784.0) 06/22/2013  . Depressive disorder, not elsewhere classified 06/22/2013  . Dehydration 04/02/2013  . Rt flank pain 04/01/2013  . H/O medication noncompliance 04/01/2013  . Left leg weakness 02/25/2013  . Pustular rash 02/23/2013  . Gingivitis 10/24/2012  . DKA, type 1 10/22/2012  . Hypotension 10/22/2012  . Hypokalemia 10/22/2012       DKA Currently on NovoLog flex pen Lantus Encouraged to check her CBGs 4 times a day, fasting and before each meal  Nicotine dependence Counseling done and nicotine patch provided  Bulimia Encouraged to follow up with [facility] Psychiatry referral provided   The patient was given clear instructions to go to ER or return to medical center if symptoms don't improve, worsen or new problems develop. The patient verbalized understanding. The patient was told to call to get any lab results if not heard anything in the next week.

## 2013-09-04 ENCOUNTER — Ambulatory Visit: Payer: Self-pay

## 2014-01-09 ENCOUNTER — Emergency Department (HOSPITAL_BASED_OUTPATIENT_CLINIC_OR_DEPARTMENT_OTHER)
Admission: EM | Admit: 2014-01-09 | Discharge: 2014-01-09 | Disposition: A | Discharge status: Home / Self Care | Payer: Medicaid Other | Attending: Emergency Medicine | Admitting: Emergency Medicine

## 2014-01-09 ENCOUNTER — Encounter (HOSPITAL_BASED_OUTPATIENT_CLINIC_OR_DEPARTMENT_OTHER): Payer: Self-pay | Admitting: Emergency Medicine

## 2014-01-09 DIAGNOSIS — Z794 Long term (current) use of insulin: Secondary | ICD-10-CM | POA: Insufficient documentation

## 2014-01-09 DIAGNOSIS — F17200 Nicotine dependence, unspecified, uncomplicated: Secondary | ICD-10-CM | POA: Insufficient documentation

## 2014-01-09 DIAGNOSIS — Z79899 Other long term (current) drug therapy: Secondary | ICD-10-CM | POA: Insufficient documentation

## 2014-01-09 DIAGNOSIS — N898 Other specified noninflammatory disorders of vagina: Secondary | ICD-10-CM | POA: Insufficient documentation

## 2014-01-09 DIAGNOSIS — Z7982 Long term (current) use of aspirin: Secondary | ICD-10-CM | POA: Insufficient documentation

## 2014-01-09 DIAGNOSIS — R21 Rash and other nonspecific skin eruption: Secondary | ICD-10-CM | POA: Insufficient documentation

## 2014-01-09 DIAGNOSIS — E119 Type 2 diabetes mellitus without complications: Secondary | ICD-10-CM | POA: Insufficient documentation

## 2014-01-09 DIAGNOSIS — N949 Unspecified condition associated with female genital organs and menstrual cycle: Secondary | ICD-10-CM

## 2014-01-09 LAB — RPR

## 2014-01-09 LAB — WET PREP, GENITAL
Trich, Wet Prep: NONE SEEN
YEAST WET PREP: NONE SEEN

## 2014-01-09 LAB — HIV ANTIBODY (ROUTINE TESTING W REFLEX): HIV: NONREACTIVE

## 2014-01-09 MED ORDER — ACETAMINOPHEN 325 MG PO TABS
650.0000 mg | ORAL_TABLET | Freq: Once | ORAL | Status: AC
  Administered 2014-01-09: 650 mg via ORAL
  Filled 2014-01-09: qty 2

## 2014-01-09 NOTE — ED Provider Notes (Signed)
CSN: [id number]     Arrival date & time 01/09/14  1226 History   First MD Initiated Contact with Patient 01/09/14 1233     Chief Complaint  Patient presents with  . Abscess     (Consider location/radiation/quality/duration/timing/severity/associated sxs/prior Treatment) HPI Comments: Patient presents to the ER for evaluation of vaginal warts. Patient reports that she first noticed the area more than a month ago. Since then, aspirin, multiple sites are coming up. She reports pain. No drainage. No discharge.  Patient is a 23 y.o. female presenting with abscess.  Abscess   Past Medical History  Diagnosis Date  . Diabetes mellitus without complication   . Noncompliance with diabetes treatment    Past Surgical History  Procedure Laterality Date  . Cesarean section    . Cesarean section  2012   Family History  Problem Relation Age of Onset  . Diabetes Mother    History  Substance Use Topics  . Smoking status: Current Every Day Smoker    Types: Cigarettes  . Smokeless tobacco: Not on file  . Alcohol Use: Yes   OB History   Grav Para Term Preterm Abortions TAB SAB Ect Mult Living                 Review of Systems  Genitourinary: Positive for vaginal pain.  Skin: Positive for rash.  All other systems reviewed and are negative.     Allergies  Review of patient's allergies indicates no known allergies.  Home Medications   Prior to Admission medications   Medication Sig Start Date End Date Taking? Authorizing Provider  aspirin-acetaminophen-caffeine (EXCEDRIN MIGRAINE) [phone] MG per tablet Take 2 tablets by mouth every 6 (six) hours as needed for pain.    Historical Provider, MD  insulin aspart (NOVOLOG FLEXPEN) 100 UNIT/ML SOPN FlexPen Inject 12 Units into the skin 3 (three) times daily with meals. 07/27/13   Russella DarAllison L Ellis, NP  Insulin Glargine 100 UNIT/ML SOPN Inject 55 Units into the skin at bedtime. 07/27/13   Russella DarAllison L Ellis, NP  Insulin Pen Needle (NOVOFINE)  30G X 8 MM MISC Inject 10 each into the skin as needed. 07/27/13   Russella DarAllison L Ellis, NP  Insulin Pen Needle (NOVOFINE) 30G X 8 MM MISC Inject 10 each into the skin as needed. 07/27/13   Russella DarAllison L Ellis, NP  nicotine (EQ NICOTINE) 14 mg/24hr patch Place 1 patch (14 mg total) onto the skin daily. 08/03/13   Richarda OverlieNayana Abrol, MD   BP 119/80  Pulse 86  Temp(Src) 98.3 F (36.8 C) (Oral)  Resp 16  Ht 5\' 10"  (1.778 m)  SpO2 100% Physical Exam  Constitutional: She is oriented to person, place, and time. She appears well-developed and well-nourished. No distress.  HENT:  Head: Normocephalic and atraumatic.  Right Ear: Hearing normal.  Left Ear: Hearing normal.  Nose: Nose normal.  Mouth/Throat: Oropharynx is clear and moist and mucous membranes are normal.  Eyes: Conjunctivae and EOM are normal. Pupils are equal, round, and reactive to light.  Neck: Normal range of motion. Neck supple.  Cardiovascular: Regular rhythm, S1 normal and S2 normal.  Exam reveals no gallop and no friction rub.   No murmur heard. Pulmonary/Chest: Effort normal and breath sounds normal. No respiratory distress. She exhibits no tenderness.  Abdominal: Soft. Normal appearance and bowel sounds are normal. There is no hepatosplenomegaly. There is no tenderness. There is no rebound, no guarding, no tenderness at McBurney's point and negative Murphy's sign. No hernia.  Genitourinary:  Uterus normal.    Cervix exhibits no motion tenderness and no friability. Vaginal discharge (white) found.  Musculoskeletal: Normal range of motion.  Neurological: She is alert and oriented to person, place, and time. She has normal strength. No cranial nerve deficit or sensory deficit. Coordination normal. GCS eye subscore is 4. GCS verbal subscore is 5. GCS motor subscore is 6.  Skin: Skin is warm, dry and intact. No rash noted. No cyanosis.  Psychiatric: She has a normal mood and affect. Her speech is normal and behavior is normal. Thought content  normal.    ED Course  Procedures (including critical care time) Labs Review Labs Reviewed  WET PREP, GENITAL - Abnormal; Notable for the following:    Clue Cells Wet Prep HPF POC FEW (*)    WBC, Wet Prep HPF POC FEW (*)    All other components within normal limits  GC/CHLAMYDIA PROBE AMP  RPR  HIV ANTIBODY (ROUTINE TESTING)    Imaging Review No results found.   EKG Interpretation None      MDM   Final diagnoses:  Genital lesion, female    She presented to the ER for evaluation of painful lesion on her vagina. There were 2 nodules noted on examination, not clearly condyloma, possibly. No sign of abscess. No herpetic lesions. STD samples obtained, pending. Patient will be referred to OB/GYN for further evaluation.    Gilda Creasehristopher J. Vickey Boak, MD 01/10/14 [phone]

## 2014-01-09 NOTE — ED Notes (Signed)
Pt reports about 1 month ago with ? Abscess to vaginal area, now with increased number, burning to sites.  Denies drainage.

## 2014-01-09 NOTE — Discharge Instructions (Signed)
Possible Genital Warts Genital warts are a sexually transmitted infection. They may appear as small bumps on the tissues of the genital area. CAUSES  Genital warts are caused by a virus called human papillomavirus (HPV). HPV is the most common sexually transmitted disease (STD) and infection of the sex organs. This infection is spread by having unprotected sex with an infected person. It can be spread by vaginal, anal, and oral sex. Many people do not know they are infected. They may be infected for years without problems. However, even if they do not have problems, they can unknowingly pass the infection to their sexual partners. SYMPTOMS   Itching and irritation in the genital area.  Warts that bleed.  Painful sexual intercourse. DIAGNOSIS  Warts are usually recognized with the naked eye on the vagina, vulva, perineum, anus, and rectum. Certain tests can also diagnose genital warts, such as:  A Pap test.  A tissue sample (biopsy) exam.  Colposcopy. A magnifying tool is used to examine the vagina and cervix. The HPV cells will change color when certain solutions are used. TREATMENT  Warts can be removed by:  Applying certain chemicals, such as cantharidin or podophyllin.  Liquid nitrogen freezing (cryotherapy).  Immunotherapy with candida or trichophyton injections.  Laser treatment.  Burning with an electrified probe (electrocautery).  Interferon injections.  Surgery. PREVENTION  HPV vaccination can help prevent HPV infections that cause genital warts and that cause cancer of the cervix. It is recommended that the vaccination be given to people between the ages 589 to 23 years old. The vaccine might not work as well or might not work at all if you already have HPV. It should not be given to pregnant women. HOME CARE INSTRUCTIONS   It is important to follow your caregiver's instructions. The warts will not go away without treatment. Repeat treatments are often needed to get rid  of warts. Even after it appears that the warts are gone, the normal tissue underneath often remains infected.  Do not try to treat genital warts with medicine used to treat hand warts. This type of medicine is strong and can burn the skin in the genital area, causing more damage.  Tell your past and current sexual partner(s) that you have genital warts. They may be infected also and need treatment.  Avoid sexual contact while being treated.  Do not touch or scratch the warts. The infection may spread to other parts of your body.  Women with genital warts should have a cervical cancer check (Pap test) at least once a year. This type of cancer is slow-growing and can be cured if found early. Chances of developing cervical cancer are increased with HPV.  Inform your obstetrician about your warts in the event of pregnancy. This virus can be passed to the baby's respiratory tract. Discuss this with your caregiver.  Use a condom during sexual intercourse. Following treatment, the use of condoms will help prevent reinfection.  Ask your caregiver about using over-the-counter anti-itch creams. SEEK MEDICAL CARE IF:   Your treated skin becomes red, swollen, or painful.  You have a fever.  You feel generally ill.  You feel little lumps in and around your genital area.  You are bleeding or have painful sexual intercourse. MAKE SURE YOU:   Understand these instructions.  Will watch your condition.  Will get help right away if you are not doing well or get worse. Document Released: 09/03/2000 Document Revised: 11/29/2011 Document Reviewed: 03/15/2011 [employer] Patient Information 2014 [place], [place].

## 2014-01-10 LAB — GC/CHLAMYDIA PROBE AMP
CT Probe RNA: NEGATIVE
GC PROBE AMP APTIMA: NEGATIVE

## 2014-10-02 ENCOUNTER — Encounter (HOSPITAL_BASED_OUTPATIENT_CLINIC_OR_DEPARTMENT_OTHER): Payer: Self-pay

## 2014-10-02 ENCOUNTER — Emergency Department (HOSPITAL_BASED_OUTPATIENT_CLINIC_OR_DEPARTMENT_OTHER)
Admission: EM | Admit: 2014-10-02 | Discharge: 2014-10-02 | Disposition: A | Discharge status: Home / Self Care | Payer: Medicaid Other | Attending: Emergency Medicine | Admitting: Emergency Medicine

## 2014-10-02 DIAGNOSIS — Z9119 Patient's noncompliance with other medical treatment and regimen: Secondary | ICD-10-CM | POA: Diagnosis not present

## 2014-10-02 DIAGNOSIS — K029 Dental caries, unspecified: Secondary | ICD-10-CM | POA: Insufficient documentation

## 2014-10-02 DIAGNOSIS — K088 Other specified disorders of teeth and supporting structures: Secondary | ICD-10-CM | POA: Diagnosis present

## 2014-10-02 DIAGNOSIS — Z720 Tobacco use: Secondary | ICD-10-CM | POA: Insufficient documentation

## 2014-10-02 DIAGNOSIS — K0889 Other specified disorders of teeth and supporting structures: Secondary | ICD-10-CM

## 2014-10-02 DIAGNOSIS — Z794 Long term (current) use of insulin: Secondary | ICD-10-CM | POA: Insufficient documentation

## 2014-10-02 DIAGNOSIS — E119 Type 2 diabetes mellitus without complications: Secondary | ICD-10-CM | POA: Insufficient documentation

## 2014-10-02 DIAGNOSIS — Z9889 Other specified postprocedural states: Secondary | ICD-10-CM | POA: Insufficient documentation

## 2014-10-02 DIAGNOSIS — R51 Headache: Secondary | ICD-10-CM | POA: Insufficient documentation

## 2014-10-02 MED ORDER — HYDROCODONE-ACETAMINOPHEN 5-325 MG PO TABS: 1.0000 | ORAL_TABLET | Freq: Four times a day (QID) | ORAL | Status: DC | PRN

## 2014-10-02 MED ORDER — AMOXICILLIN 500 MG PO CAPS: 500.0000 mg | ORAL_CAPSULE | Freq: Three times a day (TID) | ORAL | Status: DC

## 2014-10-02 MED ORDER — OXYCODONE-ACETAMINOPHEN 5-325 MG PO TABS
1.0000 | ORAL_TABLET | Freq: Once | ORAL | Status: AC
  Administered 2014-10-02: 1 via ORAL
  Filled 2014-10-02: qty 1

## 2014-10-02 NOTE — Discharge Instructions (Signed)
Amoxil for possible infection. norco for pain. Follow up with oral surgery. Call the referral provided in the next two days. Return if worsening symptom.    Dental Pain A tooth ache may be caused by cavities (tooth decay). Cavities expose the nerve of the tooth to air and hot or cold temperatures. It may come from an infection or abscess (also called a boil or furuncle) around your tooth. It is also often caused by dental caries (tooth decay). This causes the pain you are having. DIAGNOSIS  Your caregiver can diagnose this problem by exam. TREATMENT   If caused by an infection, it may be treated with medications which kill germs (antibiotics) and pain medications as prescribed by your caregiver. Take medications as directed.  Only take over-the-counter or prescription medicines for pain, discomfort, or fever as directed by your caregiver.  Whether the tooth ache today is caused by infection or dental disease, you should see your dentist as soon as possible for further care. SEEK MEDICAL CARE IF: The exam and treatment you received today has been provided on an emergency basis only. This is not a substitute for complete medical or dental care. If your problem worsens or new problems (symptoms) appear, and you are unable to meet with your dentist, call or return to this location. SEEK IMMEDIATE MEDICAL CARE IF:   You have a fever.  You develop redness and swelling of your face, jaw, or neck.  You are unable to open your mouth.  You have severe pain uncontrolled by pain medicine. MAKE SURE YOU:   Understand these instructions.  Will watch your condition.  Will get help right away if you are not doing well or get worse. Document Released: 09/06/2005 Document Revised: 11/29/2011 Document Reviewed: 04/24/2008 [facility] Patient Information 2015 [place]Care, [place]. This information is not intended to replace advice given to you by your health care provider. Make sure you discuss any questions  you have with your health care provider.

## 2014-10-02 NOTE — ED Notes (Signed)
Right upper toothache-states she was seen at dentist today for cleaning-is scheduled with another dentist for exactration of tooth 2/26

## 2014-10-02 NOTE — ED Provider Notes (Signed)
CSN: [id number]     Arrival date & time 10/02/14  1919 History   First MD Initiated Contact with Patient 10/02/14 2035     Chief Complaint  Patient presents with  . Dental Pain     (Consider location/radiation/quality/duration/timing/severity/associated sxs/prior Treatment) HPI Julie [name] is a 24 y.o. female with history of diabetes, presents to emergency department complaining of dental pain. She states she had a chipped tooth, states she had a dental appointment today. She states that she told at her dental appointment her tooth was hurting, but all they did today was clean her teeth. States that this aggravated her tooth. She reports worsening pain after that appointment, states pain is 10/10. Doesn't radiate. It is no swelling of the face. States it's giving her a headache. Patient did not take a medications prior to coming in. Patient did get a referral for an oral surgery appointment, but states she couldn't get in until February 26. Patient is requesting her tooth to be pulled out.  Past Medical History  Diagnosis Date  . Diabetes mellitus without complication   . Noncompliance with diabetes treatment    Past Surgical History  Procedure Laterality Date  . Cesarean section    . Cesarean section  2012   Family History  Problem Relation Age of Onset  . Diabetes Mother    History  Substance Use Topics  . Smoking status: Current Every Day Smoker    Types: Cigarettes  . Smokeless tobacco: Not on file  . Alcohol Use: No   OB History    No data available     Review of Systems  Constitutional: Negative for fever and chills.  HENT: Positive for dental problem. Negative for facial swelling.   Neurological: Positive for headaches.      Allergies  Review of patient's allergies indicates no known allergies.  Home Medications   Prior to Admission medications   Medication Sig Start Date End Date Taking? Authorizing Provider  insulin aspart (NOVOLOG FLEXPEN)  100 UNIT/ML SOPN FlexPen Inject 12 Units into the skin 3 (three) times daily with meals. 07/27/13   Russella Dar, NP  Insulin Glargine 100 UNIT/ML SOPN Inject 55 Units into the skin at bedtime. 07/27/13   Russella Dar, NP  Insulin Pen Needle (NOVOFINE) 30G X 8 MM MISC Inject 10 each into the skin as needed. 07/27/13   Russella Dar, NP  Insulin Pen Needle (NOVOFINE) 30G X 8 MM MISC Inject 10 each into the skin as needed. 07/27/13   Russella Dar, NP   BP 126/85 mmHg  Pulse 102  Temp(Src) 98.5 F (36.9 C) (Oral)  Resp 18  Ht  (1.778 m)  Wt 150 lb (68.04 kg)  BMI 21.52 kg/m2  SpO2 99%  LMP 09/05/2014 Physical Exam  Constitutional: She appears well-developed and well-nourished. No distress.  HENT:  Partially avulsed upper right first molar, large caries in the tooth. Gum is swollen, erythematous. No obvious abscess. Tender to palpation. No trismus. No swelling of the tongue.  Eyes: Conjunctivae are normal.  Neck: Neck supple.  Musculoskeletal: She exhibits no edema.  Lymphadenopathy:    She has no cervical adenopathy.  Neurological: She is alert.  Skin: Skin is warm and dry.  Nursing note and vitals reviewed.   ED Course  Procedures (including critical care time) Labs Review Labs Reviewed - No data to display  Imaging Review No results found.   EKG Interpretation None      MDM  Final diagnoses:  Pain, dental    A she is here with dental pain, chipped tooth, pain worsened after having her teeth cleaned today. Has appointment with oral surgeon on 2/26. I explained to patient that we did not pull teeth in emergency department. I will start her on antibiotic given possible early infection. Norco for pain. Follow-up with a dentist. Will give referral to oral surgery.  Filed Vitals:   10/02/14 1934 10/02/14 2100  BP: 126/85 112/81  Pulse: 102 96  Temp: 98.5 F (36.9 C)   TempSrc: Oral   Resp: 18 16  Height: 5\' 10"  (1.778 m)   Weight: 150 lb (68.04 kg)    SpO2: 99% 100%     Lottie Musselatyana A Mariha Sleeper, PA-C 10/02/14 2211  Gilda Creasehristopher J. Pollina, MD 10/02/14 [phone]

## 2015-02-04 ENCOUNTER — Inpatient Hospital Stay (HOSPITAL_COMMUNITY)
Admission: AD | Admit: 2015-02-04 | Discharge: 2015-02-04 | Disposition: A | Discharge status: Home / Self Care | Payer: Medicaid Other | Source: Ambulatory Visit | Attending: Family Medicine | Admitting: Family Medicine

## 2015-02-04 ENCOUNTER — Encounter (HOSPITAL_COMMUNITY): Payer: Self-pay | Admitting: *Deleted

## 2015-02-04 DIAGNOSIS — A630 Anogenital (venereal) warts: Secondary | ICD-10-CM | POA: Insufficient documentation

## 2015-02-04 DIAGNOSIS — Z794 Long term (current) use of insulin: Secondary | ICD-10-CM | POA: Diagnosis not present

## 2015-02-04 DIAGNOSIS — B373 Candidiasis of vulva and vagina: Secondary | ICD-10-CM | POA: Insufficient documentation

## 2015-02-04 DIAGNOSIS — B3731 Acute candidiasis of vulva and vagina: Secondary | ICD-10-CM

## 2015-02-04 DIAGNOSIS — E1165 Type 2 diabetes mellitus with hyperglycemia: Secondary | ICD-10-CM

## 2015-02-04 DIAGNOSIS — F17210 Nicotine dependence, cigarettes, uncomplicated: Secondary | ICD-10-CM | POA: Insufficient documentation

## 2015-02-04 DIAGNOSIS — N899 Noninflammatory disorder of vagina, unspecified: Secondary | ICD-10-CM | POA: Diagnosis present

## 2015-02-04 DIAGNOSIS — IMO0002 Reserved for concepts with insufficient information to code with codable children: Secondary | ICD-10-CM

## 2015-02-04 LAB — CBC
HCT: 41.6 % (ref 36.0–46.0)
HEMOGLOBIN: 14.1 g/dL (ref 12.0–15.0)
MCH: 28.3 pg (ref 26.0–34.0)
MCHC: 33.9 g/dL (ref 30.0–36.0)
MCV: 83.4 fL (ref 78.0–100.0)
Platelets: 217 10*3/uL (ref 150–400)
RBC: 4.99 MIL/uL (ref 3.87–5.11)
RDW: 13.5 % (ref 11.5–15.5)
WBC: 7.4 10*3/uL (ref 4.0–10.5)

## 2015-02-04 LAB — URINE MICROSCOPIC-ADD ON
RBC / HPF: NONE SEEN RBC/hpf (ref <3)
WBC, UA: NONE SEEN WBC/hpf (ref <3)

## 2015-02-04 LAB — URINALYSIS, ROUTINE W REFLEX MICROSCOPIC
Bilirubin Urine: NEGATIVE
Glucose, UA: >1000 mg/dL — AB
HGB URINE DIPSTICK: NEGATIVE
Ketones, ur: 15 mg/dL — AB
Leukocytes, UA: NEGATIVE
Nitrite: NEGATIVE
PH: 5.5 (ref 5.0–8.0)
Protein, ur: NEGATIVE mg/dL
SPECIFIC GRAVITY, URINE: 1.02 (ref 1.005–1.030)
Urobilinogen, UA: 0.2 mg/dL (ref 0.0–1.0)

## 2015-02-04 LAB — WET PREP, GENITAL
CLUE CELLS WET PREP: NONE SEEN
Trich, Wet Prep: NONE SEEN

## 2015-02-04 LAB — GLUCOSE, CAPILLARY
GLUCOSE-CAPILLARY: 542 mg/dL — AB (ref 65–99)
GLUCOSE-CAPILLARY: 298 mg/dL — AB (ref 65–99)

## 2015-02-04 LAB — COMPREHENSIVE METABOLIC PANEL
ALT: 15 U/L (ref 14–54)
ANION GAP: 10 (ref 5–15)
AST: 17 U/L (ref 15–41)
Albumin: 4 g/dL (ref 3.5–5.0)
Alkaline Phosphatase: 80 U/L (ref 38–126)
BUN: 17 mg/dL (ref 6–20)
CO2: 24 mmol/L (ref 22–32)
CREATININE: 0.59 mg/dL (ref 0.44–1.00)
Calcium: 9.1 mg/dL (ref 8.9–10.3)
Chloride: 97 mmol/L — ABNORMAL LOW (ref 101–111)
GLUCOSE: 520 mg/dL — AB (ref 65–99)
Potassium: 3.9 mmol/L (ref 3.5–5.1)
Sodium: 131 mmol/L — ABNORMAL LOW (ref 135–145)
Total Bilirubin: 0.5 mg/dL (ref 0.3–1.2)
Total Protein: 7.8 g/dL (ref 6.5–8.1)

## 2015-02-04 LAB — POCT PREGNANCY, URINE: Preg Test, Ur: NEGATIVE

## 2015-02-04 MED ORDER — SODIUM CHLORIDE 0.9 % IV SOLN: INTRAVENOUS | Status: DC

## 2015-02-04 MED ORDER — INSULIN REGULAR HUMAN 100 UNIT/ML IJ SOLN
15.0000 [IU] | Freq: Once | INTRAMUSCULAR | Status: AC
  Administered 2015-02-04: 16:00:00 via SUBCUTANEOUS

## 2015-02-04 MED ORDER — SODIUM CHLORIDE 0.9 % IV SOLN
INTRAVENOUS | Status: DC
  Administered 2015-02-04 (×2): via INTRAVENOUS

## 2015-02-04 MED ORDER — FLUCONAZOLE 150 MG PO TABS: 150.0000 mg | ORAL_TABLET | Freq: Once | ORAL | Status: DC

## 2015-02-04 MED ORDER — INSULIN DETEMIR 100 UNIT/ML ~~LOC~~ SOLN
35.0000 [IU] | Freq: Once | SUBCUTANEOUS | Status: AC
  Administered 2015-02-04: 35 [IU] via SUBCUTANEOUS
  Filled 2015-02-04: qty 0.35

## 2015-02-04 NOTE — MAU Note (Signed)
PT SAYS SHE HAS WARTS ON HER VAGINA-  NOTICED   ON 5-11.-  HAVE  BEEN  BLEEDING.   ARE PAINFUL-  LAST SEX-  LAST NIGHT.    NO  DR.

## 2015-02-04 NOTE — MAU Provider Note (Signed)
Chief Complaint: No chief complaint on file.   First Provider Initiated Contact with Patient 02/04/15 1256     SUBJECTIVE HPI: Julie [name] is a 24 y.o. [id number] female who presents to Maternity Admissions reporting having a painful lump on her genitals x 3 days. Denies Hx similar mass, however upon upon review of her chart she was seen in the emergency department for possible genital warts 1 year ago.  Also reports increase white vaginal discharge. Denies fever, chills, abdominal pain, vaginal itching or vaginal odor. States she's never seen any genital lesions on her partner.  Patient is insulin dependent diabetic. Diagnosed approximately 2011. Multiple admissions for DKA in 2014. Did not take insulin this morning because she was too busy.   Past Medical History  Diagnosis Date  . Diabetes mellitus without complication   . Noncompliance with diabetes treatment    OB History  Gravida Para Term Preterm AB SAB TAB Ectopic Multiple Living  1    1 1         # Outcome Date GA Lbr Len/2nd Weight Sex Delivery Anes PTL Lv  1 SAB              Past Surgical History  Procedure Laterality Date  . Cesarean section    . Cesarean section  2012   History   Social History  . Marital Status: Legally Separated    Spouse Name: N/A  . Number of Children: N/A  . Years of Education: N/A   Occupational History  . Not on file.   Social History Main Topics  . Smoking status: Current Every Day Smoker    Types: Cigarettes  . Smokeless tobacco: Not on file  . Alcohol Use: No  . Drug Use: No  . Sexual Activity: Not on file   Other Topics Concern  . Not on file   Social History Narrative   No current facility-administered medications on file prior to encounter.   Current Outpatient Prescriptions on File Prior to Encounter  Medication Sig Dispense Refill  . insulin aspart (NOVOLOG FLEXPEN) 100 UNIT/ML SOPN FlexPen Inject 12 Units into the skin 3 (three) times daily with meals. 1  pen 12  . amoxicillin (AMOXIL) 500 MG capsule Take 1 capsule (500 mg total) by mouth 3 (three) times daily. (Patient not taking: Reported on 02/04/2015) 21 capsule 0  . HYDROcodone-acetaminophen (NORCO) 5-325 MG per tablet Take 1 tablet by mouth every 6 (six) hours as needed for moderate pain. (Patient not taking: Reported on 02/04/2015) 20 tablet 0  . Insulin Glargine 100 UNIT/ML SOPN Inject 55 Units into the skin at bedtime. (Patient not taking: Reported on 02/04/2015) 1 pen 12  . Insulin Pen Needle (NOVOFINE) 30G X 8 MM MISC Inject 10 each into the skin as needed. 100 each 6  . Insulin Pen Needle (NOVOFINE) 30G X 8 MM MISC Inject 10 each into the skin as needed. 100 each 0   No Known Allergies  Review of Systems  Constitutional: Negative for fever and chills.  Gastrointestinal: Negative for abdominal pain.  Genitourinary:       Positive for painful genital mass and white vaginal discharge. Negative for vaginal itching, vaginal odor, vesicular lesions, pelvic pain or vaginal bleeding.  Skin: Negative for itching.   OBJECTIVE Blood pressure 112/68, pulse 86, temperature 98.4 F (36.9 C), temperature source Oral, resp. rate 18, height 5\' 8"  (1.727 m), weight 150 lb 8 oz (68.266 kg), last menstrual period 01/27/2015, SpO2 100 %. GENERAL: Well-developed, well-nourished female  in no acute distress. Anxious.  HEART: normal rate RESP: normal effort GI: Abdomen soft, non-tender. Well-healed low transverse scar. MS: Nontender, no edema NEURO: Alert and oriented SPECULUM EXAM: NEFG except for 2X4 centimeter flesh-colored mass at posterior fourchette consistent with genital warts. Few, small abraded areas. No drainage or bleeding. Scant, thin, white, odorless discharge, no blood noted, cervix clean BIMANUAL: cervix closed; uterus normal size, no adnexal tenderness or masses. No cervical motion tenderness.  LAB RESULTS Results for orders placed or performed during the hospital encounter of 02/04/15  (from the past 24 hour(s))  Urinalysis, Routine w reflex microscopic     Status: Abnormal   Collection Time: 02/04/15 11:25 AM  Result Value Ref Range   Color, Urine YELLOW YELLOW   APPearance CLEAR CLEAR   Specific Gravity, Urine 1.020 1.005 - 1.030   pH 5.5 5.0 - 8.0   Glucose, UA >1000 (A) NEGATIVE mg/dL   Hgb urine dipstick NEGATIVE NEGATIVE   Bilirubin Urine NEGATIVE NEGATIVE   Ketones, ur 15 (A) NEGATIVE mg/dL   Protein, ur NEGATIVE NEGATIVE mg/dL   Urobilinogen, UA 0.2 0.0 - 1.0 mg/dL   Nitrite NEGATIVE NEGATIVE   Leukocytes, UA NEGATIVE NEGATIVE  Urine microscopic-add on     Status: None   Collection Time: 02/04/15 11:25 AM  Result Value Ref Range   Squamous Epithelial / LPF RARE RARE   WBC, UA  <3 WBC/hpf    NO FORMED ELEMENTS SEEN ON URINE MICROSCOPIC EXAMINATION   RBC / HPF  <3 RBC/hpf    NO FORMED ELEMENTS SEEN ON URINE MICROSCOPIC EXAMINATION   Bacteria, UA RARE RARE  Pregnancy, urine POC     Status: None   Collection Time: 02/04/15 11:33 AM  Result Value Ref Range   Preg Test, Ur NEGATIVE NEGATIVE  Wet prep, genital     Status: Abnormal   Collection Time: 02/04/15  1:04 PM  Result Value Ref Range   Yeast Wet Prep HPF POC FEW (A) NONE SEEN   Trich, Wet Prep NONE SEEN NONE SEEN   Clue Cells Wet Prep HPF POC NONE SEEN NONE SEEN   WBC, Wet Prep HPF POC FEW (A) NONE SEEN  Glucose, capillary     Status: Abnormal   Collection Time: 02/04/15  1:46 PM  Result Value Ref Range   Glucose-Capillary 542 (H) 65 - 99 mg/dL  CBC     Status: None   Collection Time: 02/04/15  2:08 PM  Result Value Ref Range   WBC 7.4 4.0 - 10.5 K/uL   RBC 4.99 3.87 - 5.11 MIL/uL   Hemoglobin 14.1 12.0 - 15.0 g/dL   HCT 40.941.6 81.136.0 - 91.446.0 %   MCV 83.4 78.0 - 100.0 fL   MCH 28.3 26.0 - 34.0 pg   MCHC 33.9 30.0 - 36.0 g/dL   RDW 78.213.5 95.611.5 - 21.315.5 %   Platelets 217 150 - 400 K/uL  Comprehensive metabolic panel     Status: Abnormal   Collection Time: 02/04/15  2:08 PM  Result Value Ref Range    Sodium 131 (L) 135 - 145 mmol/L   Potassium 3.9 3.5 - 5.1 mmol/L   Chloride 97 (L) 101 - 111 mmol/L   CO2 24 22 - 32 mmol/L   Glucose, Bld 520 (H) 65 - 99 mg/dL   BUN 17 6 - 20 mg/dL   Creatinine, Ser 0.860.59 0.44 - 1.00 mg/dL   Calcium 9.1 8.9 - 57.810.3 mg/dL   Total Protein 7.8 6.5 - 8.1 g/dL  Albumin 4.0 3.5 - 5.0 g/dL   AST 17 15 - 41 U/L   ALT 15 14 - 54 U/L   Alkaline Phosphatase 80 38 - 126 U/L   Total Bilirubin 0.5 0.3 - 1.2 mg/dL   GFR calc non Af Amer >60 >60 mL/min   GFR calc Af Amer >60 >60 mL/min   Anion gap 10 5 - 15  Glucose, capillary     Status: Abnormal   Collection Time: 02/04/15  3:59 PM  Result Value Ref Range   Glucose-Capillary 298 (H) 65 - 99 mg/dL    IMAGING No results found.  MAU COURSE Wet prep, GC/chlamydia cultures, HSV culture.  Incidental finding of >1000 mg/dl glucose on UA. CBG 540. She states she did not take her insulin this morning because she was busy and did not eat. Has been eating fruit, ease and drinking soda in maternity admissions. Denies nausea, vomiting. Per consult with Dr. Adrian Blackwater CMP, CBC drawn. Levemir 35 units and regular insulin 15 units. Will bolus 2 L normal saline.  CBG 298 after insulin. Dr. Adrian Blackwater notified, reviewed labs. Okay for discharge.   ASSESSMENT 1. Genital warts   2. Uncontrolled diabetes mellitus   3. Vaginal candidiasis     PLAN Discharge home in stable condition per consult with Dr. Adrian Blackwater. Discussed risks of uncontrolled diabetes including damage to kidneys, blood vessels.  Patient reminded that she had Levemir dose for the day and does not do to have her dose at home today.  Follow-up Information    Follow up with Gynecologist of your choice.   Why:  For management of genital warts and routine gynecology care.      Follow up with [facility] [place] [facility].   Why:  As needed in emergencies   Contact information:   [address] [id number] [address]  [place] [address] [phone]      Follow up with [facility] Provider[facility]   Why:  for diabetes management         Medication List    STOP taking these medications        amoxicillin 500 MG capsule  Commonly known as:  AMOXIL     HYDROcodone-acetaminophen 5-325 MG per tablet  Commonly known as:  NORCO      TAKE these medications        fluconazole 150 MG tablet  Commonly known as:  DIFLUCAN  Take 1 tablet (150 mg total) by mouth once. Can take additional dose three days later if symptoms persist     insulin aspart 100 UNIT/ML FlexPen  Commonly known as:  NOVOLOG FLEXPEN  Inject 12 Units into the skin 3 (three) times daily with meals.     insulin detemir 100 UNIT/ML injection  Commonly known as:  LEVEMIR  Inject 35 Units into the skin daily.     insulin glargine 100 UNIT/ML injection  Commonly known as:  LANTUS  Inject 55 Units into the skin at bedtime.     Insulin Pen Needle 30G X 8 MM Misc  Commonly known as:  NOVOFINE  Inject 10 each into the skin as needed.     Insulin Pen Needle 30G X 8 MM Misc  Commonly known as:  NOVOFINE  Inject 10 each into the skin as needed.       [place], CNM 02/04/2015  1:38 PM

## 2015-02-04 NOTE — Discharge Instructions (Signed)
Verrugas genitales (Genital Warts)  Las verrugas genitales son Julie [name] enfermedad de transmisin sexual. Pueden aparecer como pequeos bultos en los tejidos de la zona genital.  CAUSAS Las verrugas genitales ests ocasionadas por un virus denominado VPH (virus del papiloma humano) Es la ms frecuente entre las enfermedades de transmisin sexual e infeccin de los rganos sexuales. Esta infeccin se disemina por Art gallery managermantener relaciones sexuales sin proteccin con una persona infectada. Puede transmitirse practicando sexo vaginal, anal u oral. Muchas personas no saben que estn infectadas. Pueden padecer la infeccin durante aos, y manifestarse pocos o ningn sntoma (problema) Tambin pueden transmitir la infeccin a sus parejas sin saberlo. SNTOMAS  Picazn e irritacin de la zona genital.  Verrugas que sangran.  Relaciones sexuales dolorosas. DIAGNSTICO Federated Department StoresLas verrugas se reconocen a simple vista en la vagina, en la vulva, el perineo, el ano y el recto. Algunas pruebas pueden diagnosticar las verrugas genitales:  Papanicolau.  Examen de Lauris Poaguna muestra de tejido biopsia.  Colposcopa Se trata de un instrumento que aumenta el tamao de lo que se observa al examinar la vagina y el cuello uterino. Al aplicar ciertas soluciones, las clulas de VPH cambian de color. TRATAMIENTO Las verrugas pueden eliminarse del siguiente modo:   Aplicando sustancias qumicas como cantaridina o podofilina.  Nitrgeno lquido congelante (crioterapia).  Inmunoterapia con inyecciones de cndida o tricofiton.  Tratamiento con lser.  Quemarlas a Annette Stabletravs de una sonda con electricidad (electrocauterizacin).  Inyecciones de interfern.  Ciruga. PREVENCIN La vacuna contra el VPH puede ayudar a prevenir la infeccin por VPH que causa verrugas genitales y cncer del cuello uterino. Se recomienda administrar la vacuna a personas de entre 9 y 26 aos de edad. La vacuna puede no funcionar correctamente o no hacer efecto en  absoluto si usted ya tiene VPH. Estas vacunas no deben administrarse a mujeres embarazadas. INSTRUCCIONES PARA EL CUIDADO DOMICILIARIO  Es importante seguir las indicaciones del profesional que la asiste. Las verrugas no desaparecern sin TEFL teachertratamiento. Generalmente es necesario repetir el tratamiento para eliminarlas. En general, an despus de que las pruebas fsicas de las verrugas hayan desaparecido, el tejido normal subyacente sigue infectado.  No intente tratar las verrugas genitales con el mismo medicamento que se [place] Islandsutiliza para las verrugas de las manos. Este tipo de medicamento es muy fuerte y puede quemar la piel de la zona genital, ocasionando ms daos.  Infrmele a sus parejas sexuales con las que [place]aya mantenido relaciones antes del tratamiento, que usted sufre de Civil Service fast streamerverrugas genitales. Podran tambin estar infectados y Network engineernecesitar tratamiento.  Evite el contacto sexual mientras IT sales professionalrealiza el tratamiento.  No frote o rasque las verrugas. Esta es una infeccin viral y puede diseminarse a otras partes del organismo (auto inoculacin).  Las mujeres que sufren de verrugas genitales debern hacer un control para Engineer, manufacturing systemsprevenir el cncer cervical (Papanicolau) al menos una vez al ao. Este tipo de cncer es de crecimiento lento y puede curarse si se detecta en una etapa temprana. Las posibilidades de Higher education careers adviserdesarrollar cncer cervical aumentan en las mujeres que han sufrido HPV.  Informe a su obstetra en caso de embarazo. Este virus puede transmitirse al tracto respiratorio del beb. Convrselo con el profesional que lo asiste.  Siempre use condones en sus relaciones sexuales. Luego del tratamiento, use preservativos para Immunologistprevenir la reinfeccin.  Para la picazn, puede usar medicamentos de venta libre; consulte con su mdico antes de Youth workerutilizarlos. SOLICITE ATENCIN MDICA SI:  La piel de la zona tratada se vuelve roja, se hincha o duele.  Tiene fiebre.  Siente  un malestar generalizado.  Siente pequeos  bultos (como granos) alrededor de la zona genital.  Licensed conveyancer o hemorragias durante las relaciones sexuales. EST SEGURO QUE:   Comprende las instrucciones para el alta mdica.  Controlar su enfermedad.  Solicitar atencin mdica de inmediato segn las indicaciones. Document Released: 06/16/2005 Document Revised: 11/29/2011 [facility] Patient Information 2015 [place], [place]. This information is not intended to replace advice given to you by your health care provider. Make sure you discuss any questions you have with your health care provider.  Diabetes Mellitus tipo 1 (Type 1 Diabetes Mellitus) La diabetes mellitus tipo1, en general conocida solo como diabetes, es una enfermedad a largo plazo (crnica). Se produce cuando las clulas de los islotes del pncreas que producen la insulina (una hormona) estn destruidos y no pueden producirla. La insulina es necesaria para movilizar los azcares de los alimentos a las clulas de los tejidos. Las clulas de los tejidos [employer] azcares para Psychiatrist. En las personas con diabetes tipo 1, los azcares se Office manager de Cytogeneticist a las clulas de los tejidos. Como resultado, se producen niveles altos de Banker (hiperglucemia). Sin insulina, el cuerpo rompe las clulas de grasa para obtener la energa necesaria. Esta ruptura de las clulas de grasa produce sustancias qumicas cidas (cetonas), lo que [employer] de cido en el [place]. [place] del alto nivel de cetonas o de azcar (glucosa) puede ser potencialmente mortal.  La diabetes tipo 1 tambin se denomina diabetes juvenil. Generalmente aparece antes de los 30 aos, pero puede ocurrir a Actuary. FACTORES DE RIESGO Una persona est predispuesta a desarrollar diabetes tipo 1 si alguien en su familia padece dicha enfermedad y est expuesta a ciertos desencadenantes ambientales adicionales.  SNTOMAS  Los sntomas de la diabetes tipo 1  pueden desarrollarse gradualmente [address] o [address], o pueden Education officer, community. Los sntomas se producen debido a la hiperglucemia. Los sntomas pueden ser:   Aumento de la sed (polidipsia).  Aumento de la miccin (poliuria).  Orina con ms frecuencia durante la noche (nocturia).  Prdida de peso. La prdida de peso puede ser muy rpida.  Infecciones frecuentes y recurrentes.  Cansancio (fatiga).  Debilidad.  Cambios en la visin, como visin borrosa.  Olor a Water quality scientist.  Dolor abdominal.  Nuseas o vmitos. DIAGNSTICO  La diabetes tipo 1 se diagnostica cuando hay sntomas de diabetes y [employer] niveles de glucosa en la [place]. El nivel de glucosa en la sangre puede controlarse en uno o ms de los siguientes anlisis de sangre:  Medicin de glucosa en la sangre en [place]. No se le permitir comer durante al menos 8 horas antes de que se tome [place].  Pruebas al azar de glucosa en la sangre. El nivel de glucosa en la sangre se controla en cualquier momento del da sin importar el momento en que haya comido.  Prueba de A1c (hemoglobina glucosilada) Una prueba de A1c proporciona informacin sobre el control de la glucosa en la sangre durante los ltimos 3 meses. TRATAMIENTO  Aunque no se puede prevenir la diabetes tipo 1, se puede controlar con insulina, dieta y ejercicio.  Tendr que aplicarse insulina diariamente para [employer] niveles de glucosa en la sangre en el rango deseado.  Usted tendr [employer] dosis de insulina con la actividad fsica y la eleccin de alimentos saludables. El objetivo del tratamiento es mantener el nivel de Psychologist, counselling  sangre antes de las comidas (glucosa preprandial) entre 70y 130mg /dl.  INSTRUCCIONES PARA EL CUIDADO EN EL HOGAR   Controle su nivel de hemoglobina A1c dos veces al ao.  Contrlese a diario Air cabin crewel nivel de glucosa en la sangre segn las indicaciones de su mdico.  Supervise  las cetonas en la orina cuando est enferma y segn las indicaciones de su mdico.  Adminstrese insulina segn las indicaciones de su mdico para Radio producermantener el nivel de glucosa en la sangre en el rango deseado.  Nunca se quede sin insulina. Es necesario que la reciba CarMaxtodos los das.  Ajuste la insulina segn la ingesta de hidratos de carbono. Los hidratos de carbono pueden aumentar los niveles de glucosa en la sangre, pero deben incluirse en su dieta. Aportan vitaminas, minerales y Guyanafibra que son Julie [name] parte esencial de una dieta saludable. Los hidratos de carbono se encuentran en frutas, verduras, cereales integrales, productos lcteos, legumbres y alimentos que contienen azcares aadidos.  Consuma alimentos saludables. Alterne 3 comidas con 3 colaciones.  Mantenga un peso saludable.  Lleve una tarjeta de alerta mdica o use una pulsera o medalla de alerta mdica.  Lleve con usted una colacin de 15gramos de hidratos de carbono en todo momento para controlar los niveles bajos de glucosa en la sangre (hipoglucemia). Algunos ejemplos de colaciones de 15gramos de hidratos de carbono son los siguientes:  Tabletas de glucosa, 3 o 4.  Gel de glucosa, tubo de 15 gramos.  Pasas de uva, 2 cucharadas (24 gramos).  Caramelos de goma, 6.  Galletas de Whitewoodanimales, 8.  Jugo de fruta, gaseosa comn, o [place]manleche descremada, 4 onzas (120 ml).  Pastillas de goma, 9.  Reconocer la hipoglucemia. La hipoglucemia se produce cuando los niveles de glucosa en la sangre son de 70 mg/dl o menos. El riesgo de hipoglucemia aumenta durante el ayuno o cuando se saltea las comidas, durante o despus de Education officer, environmentalrealizar ejercicio intenso y Nespelemmientras duerme. Los sntomas de hipoglucemia son:  Temblores o sacudidas.  Disminucin de la capacidad de concentracin.  Sudoracin.  Aumento de la frecuencia cardaca.  Dolor de Turkmenistancabeza.  Sequedad en la boca.  Hambre.  Irritabilidad.  Ansiedad.  Sueo agitado.  Alteracin  del habla o de la coordinacin.  Confusin.  Tratar la hipoglucemia rpidamente. Si usted est alerta y puede tragar con seguridad, siga la regla de 15/15 que consiste en:  Norfolk Southernome entre 15 y 20gramos de glucosa de accin rpida o carbohidratos. Las opciones de accin rpida son un gel de glucosa, tabletas de glucosa, o 4 onzas (120 ml) de jugo de frutas, gaseosa comn, o leche baja en grasa.  Compruebe su nivel de glucosa en la sangre 15 minutos despus de tomar la glucosa.  Tome entre 15 y 20 gramos ms de glucosa si el nivel de glucosa en la sangre todava es de 70mg /dl o inferior.  Ingiera una comida o una colacin en el lapso de 1 hora una vez que los niveles de glucosa en la sangre vuelven a la normalidad.  Est alerta a la poliuria y polidipsia, que son los primeros signos de la hiperglucemia. El reconocimiento temprano de la hiperglucemia permite un tratamiento oportuno. Trate la hiperglucemia segn le indic su mdico.  Haga ejercicio regularmente como se lo haya indicado el mdico. Esto incluye:  Hacer entrenamiento de Northrop Grummanresistencia dos veces a la semana, como flexiones, sentadillas, levantar peso o usar bandas de resistencia.  Practicar 150minutos de ejercicios cardiovasculares cada semana, como caminar, correr o hacer algn deporte.  Mantenerse  activo y no permanecer inactivo durante ms de seguidos.  Ajuste su dosis de insulina y la ingesta de alimentos, segn sea necesario, si inicia un nuevo ejercicio o deporte.  Siga su plan para los [address] de enfermedad cuando no pueda comer o beber como de [place].  No consuma ningn producto que contenga tabaco, como cigarrillos, tabaco de Theatre manager o Administrator, Civil Service. Si necesita ayuda para dejar de fumar, hable con el mdico.  Limite el consumo de alcohol a no ms de 1 medida por da en las mujeres no embarazadas y 2 medidas en los hombres. Debe beber alcohol solo mientras come. Hable con su mdico acerca de si el  alcohol es seguro para usted. Informe a su mdico si bebe alcohol varias veces a la semana.  Concurra a todas las visitas de control como se lo haya indicado el mdico.  Hgase un examen de vista en el lapso de los 5 aos a partir del diagnstico y luego una vez por ao.  Realice diariamente el cuidado de la piel y de los pies. Examine su piel y los pies diariamente para ver si tiene cortes, moretones, enrojecimiento, problemas en las uas, sangrado, ampollas o Advertising account planner. Su mdico debe hacerle un examen de los pies una vez por ao.  Cepllese los dientes y encas por lo menos dos veces al da y use hilo dental al menos una vez por da. Concurra regularmente a las visitas de control con el dentista.  Comparta su plan de control de diabetes en el trabajo o en la escuela.  Mantngase al da con las vacunas. Se recomienda que las [employer] de [id number] con diabetes se apliquen la vacuna contra la neumona. En algunos casos, pueden administrarse dos inyecciones separadas. Pregntele al mdico si tiene la vacuna contra la neumona al da.  Aprenda a Dealer.  Obtenga la mayor cantidad posible de informacin sobre la diabetes y solicite ayuda siempre que sea necesario.  Busque programas de rehabilitacin y participe en ellos para mantener o mejorar su independencia y su calidad de vida. Solicite la derivacin a fisioterapia o terapia ocupacional si se le [facility] o la mano, o tiene problemas para asearse, vestirse, comer, o durante la [place] fsica. SOLICITE ATENCIN MDICA SI:   No puede comer alimentos o beber por ms de 6 horas.  Tuvo nuseas o ha vomitado durante ms de 6 horas.  Su nivel de glucosa en la sangre es mayor de 240 mg/dl.  Presenta algn cambio en el estado mental.  Desarrolla una enfermedad grave adicional.  Tuvo diarrea durante ms de 6 horas.  Ha estado enfermo o ha tenido fiebre durante un par [address] y no mejora.  Siente dolor al practicar  cualquier actividad fsica. SOLICITE ATENCIN MDICA DE INMEDIATO SI:  Tiene dificultad para respirar.  Tiene niveles de cetonas moderados a altos. ASEGRESE DE QUE:  Comprende estas instrucciones.  Controlar su afeccin.  Recibir ayuda de inmediato si no mejora o si empeora. Document Released: 09/06/2005 Document Revised: 01/21/2014 [facility] Patient Information 2015 [place], [place]. This information is not intended to replace advice given to you by your health care provider. Make sure you discuss any questions you have with your health care provider.

## 2015-02-04 NOTE — Progress Notes (Signed)
Julie [name] CNM in earlier to discuss test results and d/c plan. Written and verbal d/c instructions given and understanding voiced 

## 2015-02-05 LAB — GC/CHLAMYDIA PROBE AMP (~~LOC~~) NOT AT ARMC
CHLAMYDIA, DNA PROBE: NEGATIVE
NEISSERIA GONORRHEA: NEGATIVE

## 2015-02-06 LAB — HERPES SIMPLEX VIRUS CULTURE
CULTURE: NOT DETECTED
SPECIAL REQUESTS: NORMAL

## 2015-03-30 ENCOUNTER — Emergency Department (HOSPITAL_BASED_OUTPATIENT_CLINIC_OR_DEPARTMENT_OTHER)
Admission: EM | Admit: 2015-03-30 | Discharge: 2015-03-30 | Disposition: A | Discharge status: Home / Self Care | Payer: Medicaid Other | Attending: Emergency Medicine | Admitting: Emergency Medicine

## 2015-03-30 ENCOUNTER — Encounter (HOSPITAL_BASED_OUTPATIENT_CLINIC_OR_DEPARTMENT_OTHER): Payer: Self-pay | Admitting: Emergency Medicine

## 2015-03-30 DIAGNOSIS — Z794 Long term (current) use of insulin: Secondary | ICD-10-CM | POA: Diagnosis not present

## 2015-03-30 DIAGNOSIS — R739 Hyperglycemia, unspecified: Secondary | ICD-10-CM

## 2015-03-30 DIAGNOSIS — E1165 Type 2 diabetes mellitus with hyperglycemia: Secondary | ICD-10-CM | POA: Diagnosis not present

## 2015-03-30 DIAGNOSIS — Z3202 Encounter for pregnancy test, result negative: Secondary | ICD-10-CM | POA: Diagnosis not present

## 2015-03-30 DIAGNOSIS — Z720 Tobacco use: Secondary | ICD-10-CM | POA: Diagnosis not present

## 2015-03-30 DIAGNOSIS — L0231 Cutaneous abscess of buttock: Secondary | ICD-10-CM | POA: Diagnosis present

## 2015-03-30 DIAGNOSIS — R112 Nausea with vomiting, unspecified: Secondary | ICD-10-CM | POA: Diagnosis not present

## 2015-03-30 DIAGNOSIS — E876 Hypokalemia: Secondary | ICD-10-CM | POA: Diagnosis not present

## 2015-03-30 HISTORY — DX: Dental caries, unspecified: K02.9

## 2015-03-30 HISTORY — DX: Bulimia nervosa, unspecified: F50.20

## 2015-03-30 HISTORY — DX: Bulimia nervosa: F50.2

## 2015-03-30 HISTORY — DX: Anogenital (venereal) warts: A63.0

## 2015-03-30 LAB — BASIC METABOLIC PANEL
ANION GAP: 10 (ref 5–15)
BUN: 6 mg/dL (ref 6–20)
CO2: 21 mmol/L — ABNORMAL LOW (ref 22–32)
CREATININE: 0.53 mg/dL (ref 0.44–1.00)
Calcium: 9 mg/dL (ref 8.9–10.3)
Chloride: 104 mmol/L (ref 101–111)
GFR calc non Af Amer: >60 mL/min (ref >60)
GLUCOSE: 295 mg/dL — AB (ref 65–99)
Potassium: 2.9 mmol/L — ABNORMAL LOW (ref 3.5–5.1)
Sodium: 135 mmol/L (ref 135–145)

## 2015-03-30 LAB — I-STAT VENOUS BLOOD GAS, ED
Acid-base deficit: 6 mmol/L — ABNORMAL HIGH (ref 0.0–2.0)
BICARBONATE: 19 meq/L — AB (ref 20.0–24.0)
O2 SAT: 50 %
PO2 VEN: 28 mmHg — AB (ref 30.0–45.0)
Patient temperature: 98.2
TCO2: 20 mmol/L (ref 0–100)
pCO2, Ven: 35.1 mmHg — ABNORMAL LOW (ref 45.0–50.0)
pH, Ven: 7.342 — ABNORMAL HIGH (ref 7.250–7.300)

## 2015-03-30 LAB — PREGNANCY, URINE: PREG TEST UR: NEGATIVE

## 2015-03-30 LAB — CBC WITH DIFFERENTIAL/PLATELET
Basophils Absolute: 0 10*3/uL (ref 0.0–0.1)
Basophils Relative: 0 % (ref 0–1)
Eosinophils Absolute: 0.2 10*3/uL (ref 0.0–0.7)
Eosinophils Relative: 2 % (ref 0–5)
HCT: 39.5 % (ref 36.0–46.0)
HEMOGLOBIN: 14 g/dL (ref 12.0–15.0)
LYMPHS PCT: 10 % — AB (ref 12–46)
Lymphs Abs: 1.4 10*3/uL (ref 0.7–4.0)
MCH: 29.2 pg (ref 26.0–34.0)
MCHC: 35.4 g/dL (ref 30.0–36.0)
MCV: 82.5 fL (ref 78.0–100.0)
MONOS PCT: 7 % (ref 3–12)
Monocytes Absolute: 1 10*3/uL (ref 0.1–1.0)
NEUTROS ABS: 11.1 10*3/uL — AB (ref 1.7–7.7)
NEUTROS PCT: 81 % — AB (ref 43–77)
PLATELETS: 204 10*3/uL (ref 150–400)
RBC: 4.79 MIL/uL (ref 3.87–5.11)
RDW: 14.8 % (ref 11.5–15.5)
WBC: 13.7 10*3/uL — AB (ref 4.0–10.5)

## 2015-03-30 LAB — I-STAT CG4 LACTIC ACID, ED: LACTIC ACID, VENOUS: 1.17 mmol/L (ref 0.5–2.0)

## 2015-03-30 LAB — CBG MONITORING, ED
Glucose-Capillary: 301 mg/dL — ABNORMAL HIGH (ref 65–99)
Glucose-Capillary: 219 mg/dL — ABNORMAL HIGH (ref 65–99)

## 2015-03-30 MED ORDER — VANCOMYCIN HCL IN DEXTROSE 1-5 GM/200ML-% IV SOLN
1000.0000 mg | Freq: Once | INTRAVENOUS | Status: AC
  Administered 2015-03-30: 1000 mg via INTRAVENOUS
  Filled 2015-03-30: qty 200

## 2015-03-30 MED ORDER — POTASSIUM CHLORIDE CRYS ER 20 MEQ PO TBCR
40.0000 meq | EXTENDED_RELEASE_TABLET | Freq: Once | ORAL | Status: AC
  Administered 2015-03-30: 40 meq via ORAL
  Filled 2015-03-30: qty 2

## 2015-03-30 MED ORDER — OXYCODONE-ACETAMINOPHEN 5-325 MG PO TABS: 1.0000 | ORAL_TABLET | ORAL | Status: DC | PRN

## 2015-03-30 MED ORDER — DOXYCYCLINE HYCLATE 100 MG PO CAPS
100.0000 mg | ORAL_CAPSULE | Freq: Two times a day (BID) | ORAL | Status: DC
Start: 2015-03-30 — End: 2021-05-29

## 2015-03-30 MED ORDER — OXYCODONE-ACETAMINOPHEN 5-325 MG PO TABS
2.0000 | ORAL_TABLET | Freq: Once | ORAL | Status: AC
  Administered 2015-03-30: 2 via ORAL
  Filled 2015-03-30: qty 2

## 2015-03-30 MED ORDER — LIDOCAINE-EPINEPHRINE 1 %-1:100000 IJ SOLN
INTRAMUSCULAR | Status: DC
Start: 2015-03-30 — End: 2015-03-31
  Filled 2015-03-30: qty 1

## 2015-03-30 MED ORDER — MORPHINE SULFATE 2 MG/ML IJ SOLN
2.0000 mg | Freq: Once | INTRAMUSCULAR | Status: AC
  Administered 2015-03-30: 2 mg via INTRAVENOUS
  Filled 2015-03-30: qty 1

## 2015-03-30 MED ORDER — SODIUM CHLORIDE 0.9 % IV BOLUS (SEPSIS)
1000.0000 mL | Freq: Once | INTRAVENOUS | Status: AC
  Administered 2015-03-30: 1000 mL via INTRAVENOUS

## 2015-03-30 NOTE — ED Notes (Signed)
Pt in with abscess to R buttock. States started as a small pimple like area and then began to grow.

## 2015-03-30 NOTE — ED Provider Notes (Signed)
CSN: [id number]     Arrival date & time 03/30/15  1430 History  This chart was scribed for Julie Grizzle, MD by Abel Presto, ED Scribe. This patient was seen in room MH02/MH02 and the patient's care was started at 4:29 PM.    Chief Complaint  Patient presents with  . Abscess      The history is provided by the patient. No language interpreter was used.    HPI Comments: Julie [name] is a 24 y.o. female who presents to the Emergency Department complaining of boil to right buttock with onset 4 days ago. She notes associated pain and swelling. She states she has had I&D done 2 years ago to same area. Pt with h/o IDDM for 4 years. 14 units of Novalog t.i.d. and Lantus 55 units q.h.s. She states she has not been compliant as she forgets to take her medication. She reports sugars have run high between 300-400 since onset of lesion. Pt notes associated nausea and vomiting. She reports h/o DKA 2 years ago. Pt's LNMP was 07/06. She denies fever, urinary frequency, diarrhea, and change in appetite.   Past Medical History  Diagnosis Date  . Diabetes mellitus without complication   . Noncompliance with diabetes treatment    Past Surgical History  Procedure Laterality Date  . Cesarean section    . Cesarean section  2012   Family History  Problem Relation Age of Onset  . Diabetes Mother    History  Substance Use Topics  . Smoking status: Current Every Day Smoker    Types: Cigarettes  . Smokeless tobacco: Not on file  . Alcohol Use: No   OB History    Gravida Para Term Preterm AB TAB SAB Ectopic Multiple Living   Review of Systems  Constitutional: Negative for fever, chills and appetite change.  Gastrointestinal: Positive for nausea and vomiting. Negative for diarrhea.  Genitourinary: Negative for frequency.  Skin:       Boil to right buttock  All other systems reviewed and are negative.     Allergies  Review of patient's allergies indicates no  known allergies.  Home Medications   Prior to Admission medications   Medication Sig Start Date End Date Taking? Authorizing Provider  fluconazole (DIFLUCAN) 150 MG tablet Take 1 tablet (150 mg total) by mouth once. Can take additional dose three days later if symptoms persist 02/04/15   Dorathy Kinsman, CNM  insulin aspart (NOVOLOG FLEXPEN) 100 UNIT/ML SOPN FlexPen Inject 12 Units into the skin 3 (three) times daily with meals. 07/27/13   Russella Dar, NP  insulin detemir (LEVEMIR) 100 UNIT/ML injection Inject 35 Units into the skin daily.    Historical Provider, MD  insulin glargine (LANTUS) 100 UNIT/ML injection Inject 55 Units into the skin at bedtime.    Historical Provider, MD  Insulin Pen Needle (NOVOFINE) 30G X 8 MM MISC Inject 10 each into the skin as needed. 07/27/13   Russella Dar, NP  Insulin Pen Needle (NOVOFINE) 30G X 8 MM MISC Inject 10 each into the skin as needed. 07/27/13   Russella Dar, NP   BP 113/72 mmHg  Pulse 112  Temp(Src) 98.2 F (36.8 C) (Oral)  Resp 18  Ht  (1.778 m)  Wt 130 lb (58.968 kg)  BMI 18.65 kg/m2  SpO2 100%  LMP 03/27/2015 Physical Exam  Constitutional: She is oriented to person, place, and time. She  appears well-developed and well-nourished.  HENT:  Head: Normocephalic and atraumatic.  Right Ear: External ear normal.  Left Ear: External ear normal.  Nose: Nose normal.  Mouth/Throat: Oropharynx is clear and moist.  Eyes: Conjunctivae and EOM are normal. Pupils are equal, round, and reactive to light.  Neck: Normal range of motion. Neck supple.  Pulmonary/Chest: Effort normal.  Abdominal: Soft. Bowel sounds are normal.  Musculoskeletal: Normal range of motion.  Neurological: She is alert and oriented to person, place, and time.  Skin: Skin is warm and dry.  Erythematous red indurated area left buttock 10 x 10 cm  Psychiatric: She has a normal mood and affect. Her behavior is normal.  Nursing note and vitals reviewed.   ED Course   Procedures (including critical care time) DIAGNOSTIC STUDIES: Oxygen Saturation is 100% on room air, normal by my interpretation.    COORDINATION OF CARE: 4:34 PM Discussed treatment plan with patient at beside, the patient agrees with the plan and has no further questions at this time.   Labs Review Labs Reviewed  CBC WITH DIFFERENTIAL/PLATELET - Abnormal; Notable for the following:    WBC 13.7 (*)    Neutrophils Relative % 81 (*)    Neutro Abs 11.1 (*)    Lymphocytes Relative 10 (*)    All other components within normal limits  BASIC METABOLIC PANEL - Abnormal; Notable for the following:    Potassium 2.9 (*)    CO2 21 (*)    Glucose, Bld 295 (*)    All other components within normal limits  CBG MONITORING, ED - Abnormal; Notable for the following:    Glucose-Capillary 301 (*)    All other components within normal limits  CBG MONITORING, ED - Abnormal; Notable for the following:    Glucose-Capillary 219 (*)    All other components within normal limits  I-STAT VENOUS BLOOD GAS, ED - Abnormal; Notable for the following:    pH, Ven 7.342 (*)    pCO2, Ven 35.1 (*)    pO2, Ven 28.0 (*)    Bicarbonate 19.0 (*)    Acid-base deficit 6.0 (*)    All other components within normal limits  CULTURE, BLOOD (ROUTINE X 2)  CULTURE, BLOOD (ROUTINE X 2)  BLOOD GAS, VENOUS  PREGNANCY, URINE  I-STAT CG4 LACTIC ACID, ED  I-STAT CG4 LACTIC ACID, ED    Imaging Review No results found.   EKG Interpretation None     INCISION AND DRAINAGE PROCEDURE NOTE: Patient identification was confirmed and verbal consent was obtained. This procedure was performed by Julie Grizzleanielle Sarinah Doetsch, MD at 5:29 PM. Site: right buttock Sterile procedures observed: Area cleaned with betadine Needle size: 25 gauge Anesthetic used (type and amt): lidocaine- EPI 1%, 2 cc  Blade size: 11; 2 cm elliptical incision Drainage:. Large quantity purulent discharge. Complexity: Complex Packing used Site anesthetized, incision  made over site, wound drained and explored loculations, rinsed with copious amounts of normal saline, wound packed with sterile gauze, covered with dry, sterile dressing.  Pt tolerated procedure well without complications.  Instructions for care discussed verbally and pt provided with additional written instructions for homecare and f/u.    MDM   Final diagnoses:  Abscess of buttock, left  Hyperglycemia  Hypokalemia    24 year old insulin-dependent diabetic who presents today with abscess. INT done here in the emergency department 1 g of vancomycin infused. Patient was initially hyperglycemic with blood sugar of 300 and after 1 L of normal saline has had a decrease in  her blood sugar to 219. She is nontoxic in appearance. She is having her potassium orally repleted. She does have a history of DKA but does not acidotic here today and does not have an anion gap.patient will be placed on oral doxycycline per ID as a soft tissue infection guidelines. She is advised regarding need for close follow-up and return precautions and voices understanding.    Julie Grizzle, MD 03/30/15 [phone]

## 2015-03-30 NOTE — ED Notes (Signed)
Pt states she is diabetic and her CBG was over 400 today and she did take her insulin as directed.

## 2015-03-30 NOTE — ED Notes (Signed)
Pt presents with elevated Blood Sugars, abscess, located on left buttocks, pt states has been having some nausea and vomiting since yesterday.

## 2015-03-30 NOTE — ED Notes (Signed)
Bedside verbal orders also rec from EDP

## 2015-03-30 NOTE — ED Notes (Signed)
MD at bedside. 

## 2015-03-31 ENCOUNTER — Encounter (HOSPITAL_BASED_OUTPATIENT_CLINIC_OR_DEPARTMENT_OTHER): Payer: Self-pay | Admitting: *Deleted

## 2015-03-31 ENCOUNTER — Emergency Department (HOSPITAL_BASED_OUTPATIENT_CLINIC_OR_DEPARTMENT_OTHER)
Admission: EM | Admit: 2015-03-31 | Discharge: 2015-03-31 | Disposition: A | Discharge status: Home / Self Care | Payer: Medicaid Other | Attending: Emergency Medicine | Admitting: Emergency Medicine

## 2015-03-31 DIAGNOSIS — Z8619 Personal history of other infectious and parasitic diseases: Secondary | ICD-10-CM | POA: Diagnosis not present

## 2015-03-31 DIAGNOSIS — Z9119 Patient's noncompliance with other medical treatment and regimen: Secondary | ICD-10-CM | POA: Insufficient documentation

## 2015-03-31 DIAGNOSIS — Z720 Tobacco use: Secondary | ICD-10-CM | POA: Insufficient documentation

## 2015-03-31 DIAGNOSIS — Z792 Long term (current) use of antibiotics: Secondary | ICD-10-CM | POA: Insufficient documentation

## 2015-03-31 DIAGNOSIS — E119 Type 2 diabetes mellitus without complications: Secondary | ICD-10-CM | POA: Diagnosis not present

## 2015-03-31 DIAGNOSIS — Z4801 Encounter for change or removal of surgical wound dressing: Secondary | ICD-10-CM | POA: Diagnosis present

## 2015-03-31 DIAGNOSIS — Z794 Long term (current) use of insulin: Secondary | ICD-10-CM | POA: Diagnosis not present

## 2015-03-31 DIAGNOSIS — Z5189 Encounter for other specified aftercare: Secondary | ICD-10-CM

## 2015-03-31 DIAGNOSIS — Z8719 Personal history of other diseases of the digestive system: Secondary | ICD-10-CM | POA: Diagnosis not present

## 2015-03-31 NOTE — ED Notes (Signed)
Pt seen here yesterday for an abscess on her buttocks. Same was packed and she is here today for f/u.

## 2015-03-31 NOTE — Discharge Instructions (Signed)
Continue your antibiotics and pain medications as previously prescribed.  Perform warm soaks as frequently as possible for the next 2-3 days.  Return to the emergency department if symptoms significantly worsen or change.   Wound Check Your wound appears healthy today. Your wound will heal gradually over time. Eventually a scar will form that will fade with time. FACTORS THAT AFFECT SCAR FORMATION:  People differ in the severity in which they scar.  Scar severity varies according to location, size, and the traits you inherited from your parents (genetic predisposition).  Irritation to the wound from infection, rubbing, or chemical exposure will increase the amount of scar formation. HOME CARE INSTRUCTIONS   If you were given a dressing, you should change it at least once a day or as instructed by your caregiver. If the bandage sticks, soak it off with a solution of hydrogen peroxide.  If the bandage becomes wet, dirty, or develops a bad smell, change it as soon as possible.  Look for signs of infection.  Only take over-the-counter or prescription medicines for pain, discomfort, or fever as directed by your caregiver. SEEK IMMEDIATE MEDICAL CARE IF:   You have redness, swelling, or increasing pain in the wound.  You notice pus coming from the wound.  You have a fever.  You notice a bad smell coming from the wound or dressing. Document Released: 06/12/2004 Document Revised: 11/29/2011 Document Reviewed: 09/06/2005 [facility] Patient Information 2015 [place], [place]. This information is not intended to replace advice given to you by your health care provider. Make sure you discuss any questions you have with your health care provider.

## 2015-03-31 NOTE — ED Provider Notes (Signed)
CSN: [id number]     Arrival date & time 03/31/15  1811 History  This chart was scribed for Geoffery Lyons, MD by Budd Palmer, ED Scribe. This patient was seen in room MH03/MH03 and the patient's care was started at 7:32 PM.    Chief Complaint  Patient presents with  . Abscess   Patient is a 24 y.o. female presenting with abscess.  Abscess Location:  Ano-genital Ano-genital abscess location:  R buttock Abscess quality: painful and redness   Progression:  Worsening Pain details:    Quality:  Aching   Severity:  Moderate   Timing:  Constant   Progression:  Worsening Chronicity:  New Context: diabetes   Relieved by:  Nothing Worsened by:  Nothing tried Ineffective treatments:  None tried Associated symptoms: no fever   Risk factors: prior abscess    HPI Comments: Julie [name] is a 24 y.o. female who presents to the Emergency Department complaining of a worsening, painful, burning, abscess onset earlier today. Pt has been to the ED yesterday when she underwent I&D. She states that since the procedure yesterday the pain has gotten progressively worse. She denies associated fever.  Past Medical History  Diagnosis Date  . Diabetes mellitus without complication   . Noncompliance with diabetes treatment   . Bulimia nervosa   . Genital warts   . Dental caries    Past Surgical History  Procedure Laterality Date  . Cesarean section    . Cesarean section  2012   Family History  Problem Relation Age of Onset  . Diabetes Mother    History  Substance Use Topics  . Smoking status: Current Every Day Smoker    Types: Cigarettes  . Smokeless tobacco: Not on file  . Alcohol Use: No   OB History    Gravida Para Term Preterm AB TAB SAB Ectopic Multiple Living   Review of Systems  Constitutional: Negative for fever.    Allergies  Review of patient's allergies indicates no known allergies.  Home Medications   Prior to Admission medications    Medication Sig Start Date End Date Taking? Authorizing Provider  doxycycline (VIBRAMYCIN) 100 MG capsule Take 1 capsule (100 mg total) by mouth 2 (two) times daily. 03/30/15   Margarita Grizzle, MD  fluconazole (DIFLUCAN) 150 MG tablet Take 1 tablet (150 mg total) by mouth once. Can take additional dose three days later if symptoms persist 02/04/15   Dorathy Kinsman, CNM  insulin aspart (NOVOLOG FLEXPEN) 100 UNIT/ML SOPN FlexPen Inject 12 Units into the skin 3 (three) times daily with meals. 07/27/13   Russella Dar, NP  insulin detemir (LEVEMIR) 100 UNIT/ML injection Inject 35 Units into the skin daily.    Historical Provider, MD  insulin glargine (LANTUS) 100 UNIT/ML injection Inject 55 Units into the skin at bedtime.    Historical Provider, MD  Insulin Pen Needle (NOVOFINE) 30G X 8 MM MISC Inject 10 each into the skin as needed. 07/27/13   Russella Dar, NP  Insulin Pen Needle (NOVOFINE) 30G X 8 MM MISC Inject 10 each into the skin as needed. 07/27/13   Russella Dar, NP  oxyCODONE-acetaminophen (PERCOCET/ROXICET) 5-325 MG per tablet Take 1 tablet by mouth every 4 (four) hours as needed for severe pain. 03/30/15   Margarita Grizzle, MD   BP 105/67 mmHg  Pulse 95  Temp(Src) 99.1 F (37.3 C) (Oral)  Resp 18  Wt 130  lb (58.968 kg)  SpO2 98%  LMP 03/27/2015 Physical Exam  Constitutional: She is oriented to person, place, and time. She appears well-developed and well-nourished. No distress.  HENT:  Head: Normocephalic and atraumatic.  Mouth/Throat: Oropharynx is clear and moist.  Eyes: Conjunctivae and EOM are normal. Pupils are equal, round, and reactive to light.  Neck: Normal range of motion. Neck supple. No tracheal deviation present.  Cardiovascular: Normal rate.   Pulmonary/Chest: Breath sounds normal. No respiratory distress.  Abdominal: Soft.  Musculoskeletal: Normal range of motion.  Neurological: She is alert and oriented to person, place, and time.  Skin: Skin is warm and dry.  The top  of the right buttock is noted to have a previously I&D'd abscess. There is a packing in place and surrounding erythema. There is no significant drainage.  Psychiatric: She has a normal mood and affect. Her behavior is normal.  Nursing note and vitals reviewed.   ED Course  Procedures  DIAGNOSTIC STUDIES: Oxygen Saturation is 98% on RA, normal by my interpretation.    COORDINATION OF CARE: 7:34 PM - Discussed plans to continue with antibiotics and pain medication. Will renew dressing. Advised to soak. Pt advised of plan for treatment and pt agrees.  Labs Review Labs Reviewed - No data to display  Imaging Review No results found.   EKG Interpretation None      MDM   Final diagnoses:  None    Packing removed. The wound appears to be healing appropriately. Will recommend continued antibiotics, warm soaks, and when necessary return.  I personally performed the services described in this documentation, which was scribed in my presence. The recorded information has been reviewed and is accurate.      Geoffery Lyonsouglas Bently Wyss, MD 03/31/15 [phone]

## 2015-04-04 LAB — CULTURE, BLOOD (ROUTINE X 2)
CULTURE: NO GROWTH
Culture: NO GROWTH

## 2017-04-10 ENCOUNTER — Emergency Department (HOSPITAL_BASED_OUTPATIENT_CLINIC_OR_DEPARTMENT_OTHER)
Admission: EM | Admit: 2017-04-10 | Discharge: 2017-04-10 | Disposition: A | Discharge status: Home / Self Care | Payer: Medicaid Other | Attending: Emergency Medicine | Admitting: Emergency Medicine

## 2017-04-10 ENCOUNTER — Encounter (HOSPITAL_BASED_OUTPATIENT_CLINIC_OR_DEPARTMENT_OTHER): Payer: Self-pay | Admitting: Emergency Medicine

## 2017-04-10 DIAGNOSIS — Z79899 Other long term (current) drug therapy: Secondary | ICD-10-CM | POA: Diagnosis not present

## 2017-04-10 DIAGNOSIS — F17210 Nicotine dependence, cigarettes, uncomplicated: Secondary | ICD-10-CM | POA: Diagnosis not present

## 2017-04-10 DIAGNOSIS — R739 Hyperglycemia, unspecified: Secondary | ICD-10-CM

## 2017-04-10 DIAGNOSIS — E1165 Type 2 diabetes mellitus with hyperglycemia: Secondary | ICD-10-CM | POA: Insufficient documentation

## 2017-04-10 DIAGNOSIS — Z794 Long term (current) use of insulin: Secondary | ICD-10-CM | POA: Insufficient documentation

## 2017-04-10 DIAGNOSIS — L089 Local infection of the skin and subcutaneous tissue, unspecified: Secondary | ICD-10-CM

## 2017-04-10 LAB — CBG MONITORING, ED: Glucose-Capillary: 411 mg/dL — ABNORMAL HIGH (ref 65–99)

## 2017-04-10 MED ORDER — CEPHALEXIN 500 MG PO CAPS: 500.0000 mg | ORAL_CAPSULE | Freq: Four times a day (QID) | ORAL | 0 refills | Status: DC

## 2017-04-10 MED ORDER — INSULIN ASPART 100 UNIT/ML FLEXPEN: 12.0000 [IU] | PEN_INJECTOR | Freq: Three times a day (TID) | SUBCUTANEOUS | 0 refills | Status: AC

## 2017-04-10 MED ORDER — SULFAMETHOXAZOLE-TRIMETHOPRIM 800-160 MG PO TABS: 1.0000 | ORAL_TABLET | Freq: Two times a day (BID) | ORAL | 0 refills | Status: AC

## 2017-04-10 NOTE — ED Provider Notes (Signed)
[facility] Provider [facility]   CSN: [id number] Arrival date & time: 04/10/17  2200     History   Chief Complaint Chief Complaint  Patient presents with  . Wound Infection    HPI Julie [name] is a 26 y.o. female.  HPI Patient presents with possible left toe infection. States it does been slightly discolored for 3 weeks for now as had some pus draining out of it. No injury. She is diabetic. States his sugars been running high. States she's been using her medicines. No urinary frequency. No bowel pain. No lightheadedness. Past Medical History:  Diagnosis Date  . Bulimia nervosa   . Dental caries   . Diabetes mellitus without complication (HCC)   . Genital warts   . Noncompliance with diabetes treatment     Patient Active Problem List   Diagnosis Date Noted  . Back pain 07/26/2013  . Headache(784.0) 06/22/2013  . Depressive disorder, not elsewhere classified 06/22/2013  . Dehydration 04/02/2013  . Rt flank pain 04/01/2013  . H/O medication noncompliance 04/01/2013  . Left leg weakness 02/25/2013  . Pustular rash 02/23/2013  . Gingivitis 10/24/2012  . DKA, type 1 (HCC) 10/22/2012  . Hypotension 10/22/2012  . Hypokalemia 10/22/2012    Past Surgical History:  Procedure Laterality Date  . CESAREAN SECTION    . CESAREAN SECTION  2012    OB History    Gravida Para Term Preterm AB Living   1       1     SAB TAB Ectopic Multiple Live Births   1               Home Medications    Prior to Admission medications   Medication Sig Start Date End Date Taking? Authorizing Provider  cephALEXin (KEFLEX) 500 MG capsule Take 1 capsule (500 mg total) by mouth 4 (four) times daily. 04/10/17   Benjiman Core, MD  doxycycline (VIBRAMYCIN) 100 MG capsule Take 1 capsule (100 mg total) by mouth 2 (two) times daily. 03/30/15   Margarita Grizzle, MD  fluconazole (DIFLUCAN) 150 MG tablet Take 1 tablet (150 mg total) by mouth once. Can take additional dose three days  later if symptoms persist 02/04/15   Katrinka Blazing, [place], CNM  insulin aspart (NOVOLOG FLEXPEN) 100 UNIT/ML FlexPen Inject 12 Units into the skin 3 (three) times daily with meals. 04/10/17   Benjiman Core, MD  insulin detemir (LEVEMIR) 100 UNIT/ML injection Inject 35 Units into the skin daily.    [provider]  insulin glargine (LANTUS) 100 UNIT/ML injection Inject 55 Units into the skin at bedtime.    [provider]  Insulin Pen Needle (NOVOFINE) 30G X 8 MM MISC Inject 10 each into the skin as needed. 07/27/13   Russella Dar, NP  Insulin Pen Needle (NOVOFINE) 30G X 8 MM MISC Inject 10 each into the skin as needed. 07/27/13   Russella Dar, NP  oxyCODONE-acetaminophen (PERCOCET/ROXICET) 5-325 MG per tablet Take 1 tablet by mouth every 4 (four) hours as needed for severe pain. 03/30/15   Margarita Grizzle, MD  sulfamethoxazole-trimethoprim (BACTRIM DS,SEPTRA DS) 800-160 MG tablet Take 1 tablet by mouth 2 (two) times daily. 04/10/17 04/17/17  Benjiman Core, MD    Family History Family History  Problem Relation Age of Onset  . Diabetes Mother     Social History Social History  Substance Use Topics  . Smoking status: Current Every Day Smoker    Types: Cigarettes  . Smokeless tobacco: Not on file  .  Alcohol use No     Allergies   Patient has no known allergies.   Review of Systems Review of Systems  Constitutional: Negative for appetite change.  HENT: Negative for congestion.   Eyes: Negative for photophobia.  Respiratory: Negative for shortness of breath.   Cardiovascular: Negative for chest pain.  Gastrointestinal: Negative for abdominal pain.  Endocrine: Negative for polyuria.  Genitourinary: Negative for flank pain.  Musculoskeletal: Negative for back pain.  Skin: Positive for wound.  Neurological: Negative for syncope and headaches.  Psychiatric/Behavioral: Negative for confusion.     Physical Exam Updated Vital Signs BP 117/72 (BP Location: Left  Arm)   Pulse 95   Temp 98.8 F (37.1 C) (Oral)   Resp 18   SpO2 100%   Physical Exam  Constitutional: She appears well-developed.  HENT:  Head: Atraumatic.  Neck: Neck supple.  Cardiovascular: Normal rate.   Pulmonary/Chest: Effort normal.  Abdominal: Soft. There is no tenderness.  Musculoskeletal:  Left great toe nail is somewhat loose. No drainage. No surrounding erythema. No fluctuance.  Neurological: She is alert.  Skin: Skin is warm. Capillary refill takes less than 2 seconds.     ED Treatments / Results  Labs (all labs ordered are listed, but only abnormal results are displayed) Labs Reviewed  CBG MONITORING, ED - Abnormal; Notable for the following:       Result Value   Glucose-Capillary 411 (*)    All other components within normal limits    EKG  EKG Interpretation None       Radiology No results found.  Procedures Procedures (including critical care time)  Medications Ordered in ED Medications - No data to display   Initial Impression / Assessment and Plan / ED Course  I have reviewed the triage vital signs and the nursing notes.  Pertinent labs & imaging results that were available during my care of the patient were reviewed by me and considered in my medical decision making (see chart for details).     Patient presents with hyperglycemia. History same. Has been taking her medicines. States she is not normally come in when her sugar goes as high. She does however have an infection on her left toe. Has had some purulent drainage. Skin surrounding the nail appears okay. States there has been purulent drainage. I think it is reasonable to empirically treat with antibiotics. Nail does not appear to need removal right now. Does not need x-ray. Discharge home to follow-up with her doctor. States she has follow-up arranged.  Final Clinical Impressions(s) / ED Diagnoses   Final diagnoses:  Toe infection  Hyperglycemia    New Prescriptions New  Prescriptions   CEPHALEXIN (KEFLEX) 500 MG CAPSULE    Take 1 capsule (500 mg total) by mouth 4 (four) times daily.   SULFAMETHOXAZOLE-TRIMETHOPRIM (BACTRIM DS,SEPTRA DS) 800-160 MG TABLET    Take 1 tablet by mouth 2 (two) times daily.     Benjiman CorePickering, Goldie Dimmer, MD 04/10/17 2240

## 2017-04-10 NOTE — ED Triage Notes (Signed)
PT presents with infection to little toe on left foot, pt sts it has been there 3 weeks but noticed pus coming out of it today.

## 2017-10-28 ENCOUNTER — Encounter (HOSPITAL_BASED_OUTPATIENT_CLINIC_OR_DEPARTMENT_OTHER): Payer: Self-pay | Admitting: Emergency Medicine

## 2017-10-28 ENCOUNTER — Other Ambulatory Visit: Payer: Self-pay

## 2017-10-28 ENCOUNTER — Emergency Department (HOSPITAL_BASED_OUTPATIENT_CLINIC_OR_DEPARTMENT_OTHER)
Admission: EM | Admit: 2017-10-28 | Discharge: 2017-10-29 | Disposition: A | Discharge status: Home / Self Care | Payer: Medicaid Other | Attending: Emergency Medicine | Admitting: Emergency Medicine

## 2017-10-28 DIAGNOSIS — E1065 Type 1 diabetes mellitus with hyperglycemia: Secondary | ICD-10-CM | POA: Diagnosis not present

## 2017-10-28 DIAGNOSIS — F17210 Nicotine dependence, cigarettes, uncomplicated: Secondary | ICD-10-CM | POA: Insufficient documentation

## 2017-10-28 DIAGNOSIS — R112 Nausea with vomiting, unspecified: Secondary | ICD-10-CM | POA: Insufficient documentation

## 2017-10-28 DIAGNOSIS — Z79899 Other long term (current) drug therapy: Secondary | ICD-10-CM | POA: Diagnosis not present

## 2017-10-28 DIAGNOSIS — R109 Unspecified abdominal pain: Secondary | ICD-10-CM | POA: Insufficient documentation

## 2017-10-28 DIAGNOSIS — R739 Hyperglycemia, unspecified: Secondary | ICD-10-CM

## 2017-10-28 LAB — CBG MONITORING, ED: Glucose-Capillary: 468 mg/dL — ABNORMAL HIGH (ref 65–99)

## 2017-10-28 NOTE — ED Triage Notes (Signed)
Pt presents with c/o high blood sugar nausea and weakness for a month. CBG in triage 468. Pt reports abdominal pain and lower back pain

## 2017-10-29 LAB — BASIC METABOLIC PANEL
Anion gap: 12 (ref 5–15)
BUN: 14 mg/dL (ref 6–20)
CO2: 22 mmol/L (ref 22–32)
Calcium: 9.5 mg/dL (ref 8.9–10.3)
Chloride: 99 mmol/L — ABNORMAL LOW (ref 101–111)
Creatinine, Ser: 0.53 mg/dL (ref 0.44–1.00)
GFR calc Af Amer: >60 mL/min (ref >60)
GFR calc non Af Amer: >60 mL/min (ref >60)
Glucose, Bld: 484 mg/dL — ABNORMAL HIGH (ref 65–99)
Potassium: 4.1 mmol/L (ref 3.5–5.1)
SODIUM: 133 mmol/L — AB (ref 135–145)

## 2017-10-29 LAB — CBC
HCT: 41 % (ref 36.0–46.0)
Hemoglobin: 14 g/dL (ref 12.0–15.0)
MCH: 28.1 pg (ref 26.0–34.0)
MCHC: 34.1 g/dL (ref 30.0–36.0)
MCV: 82.2 fL (ref 78.0–100.0)
PLATELETS: 211 10*3/uL (ref 150–400)
RBC: 4.99 MIL/uL (ref 3.87–5.11)
RDW: 13.4 % (ref 11.5–15.5)
WBC: 8.5 10*3/uL (ref 4.0–10.5)

## 2017-10-29 LAB — URINALYSIS, ROUTINE W REFLEX MICROSCOPIC
BILIRUBIN URINE: NEGATIVE
Glucose, UA: >=500 mg/dL — AB
Hgb urine dipstick: NEGATIVE
Ketones, ur: 40 mg/dL — AB
Leukocytes, UA: NEGATIVE
Nitrite: NEGATIVE
PROTEIN: NEGATIVE mg/dL
Specific Gravity, Urine: 1.01 (ref 1.005–1.030)
pH: 6 (ref 5.0–8.0)

## 2017-10-29 LAB — URINALYSIS, MICROSCOPIC (REFLEX)

## 2017-10-29 LAB — CBG MONITORING, ED: GLUCOSE-CAPILLARY: 318 mg/dL — AB (ref 65–99)

## 2017-10-29 LAB — PREGNANCY, URINE: Preg Test, Ur: NEGATIVE

## 2017-10-29 MED ORDER — PROMETHAZINE HCL 25 MG/ML IJ SOLN
12.5000 mg | Freq: Once | INTRAMUSCULAR | Status: AC
  Administered 2017-10-29: 12.5 mg via INTRAVENOUS
  Filled 2017-10-29: qty 1

## 2017-10-29 MED ORDER — SODIUM CHLORIDE 0.9 % IV BOLUS (SEPSIS)
1000.0000 mL | Freq: Once | INTRAVENOUS | Status: AC
  Administered 2017-10-29: 1000 mL via INTRAVENOUS

## 2017-10-29 MED ORDER — INSULIN REGULAR HUMAN 100 UNIT/ML IJ SOLN
8.0000 [IU] | Freq: Once | INTRAMUSCULAR | Status: AC
  Administered 2017-10-29: 8 [IU] via SUBCUTANEOUS
  Filled 2017-10-29: qty 1

## 2017-10-29 MED ORDER — PROMETHAZINE HCL 25 MG PO TABS: 25.0000 mg | ORAL_TABLET | Freq: Four times a day (QID) | ORAL | 0 refills | Status: DC | PRN

## 2017-10-29 MED ORDER — KETOROLAC TROMETHAMINE 30 MG/ML IJ SOLN
30.0000 mg | Freq: Once | INTRAMUSCULAR | Status: AC
  Administered 2017-10-29: 30 mg via INTRAVENOUS
  Filled 2017-10-29: qty 1

## 2017-10-29 MED ORDER — ONDANSETRON HCL 4 MG/2ML IJ SOLN
4.0000 mg | Freq: Once | INTRAMUSCULAR | Status: AC | PRN
  Administered 2017-10-29: 4 mg via INTRAVENOUS
  Filled 2017-10-29: qty 2

## 2017-10-29 NOTE — ED Provider Notes (Signed)
[facility] EMERGENCY DEPARTMENT Provider Note   CSN: [id number] Arrival date & time: 10/28/17  2321     History   Chief Complaint Chief Complaint  Patient presents with  . Hyperglycemia    HPI Jonesha Tsuchiya is a 27 y.o. female.  Patient is a 27 year old female with history of type 1 diabetes presenting for evaluation of elevated blood sugar, abdominal pain, nausea, and vomiting.  This is been worsening over the past 24 hours.  She was recently seen at [facility] with similar complaints.  She underwent a pelvic exam, CT scan, laboratory studies, and workup was unremarkable.  She denies to me she is has any fevers or chills.  She denies any diarrhea or bloody stools.  No contacts.   The history is provided by the patient.    Past Medical History:  Diagnosis Date  . Bulimia nervosa   . Dental caries   . Diabetes mellitus without complication (HCC)   . Genital warts   . Noncompliance with diabetes treatment     Patient Active Problem List   Diagnosis Date Noted  . Back pain 07/26/2013  . Headache(784.0) 06/22/2013  . Depressive disorder, not elsewhere classified 06/22/2013  . Dehydration 04/02/2013  . Rt flank pain 04/01/2013  . H/O medication noncompliance 04/01/2013  . Left leg weakness 02/25/2013  . Pustular rash 02/23/2013  . Gingivitis 10/24/2012  . DKA, type 1 (HCC) 10/22/2012  . Hypotension 10/22/2012  . Hypokalemia 10/22/2012    Past Surgical History:  Procedure Laterality Date  . CESAREAN SECTION    . CESAREAN SECTION  2012    OB History    Gravida Para Term Preterm AB Living   1       1     SAB TAB Ectopic Multiple Live Births   1               Home Medications    Prior to Admission medications   Medication Sig Start Date End Date Taking? Authorizing Provider  cephALEXin (KEFLEX) 500 MG capsule Take 1 capsule (500 mg total) by mouth 4 (four) times daily. 04/10/17   Benjiman Core, MD  doxycycline (VIBRAMYCIN) 100 MG  capsule Take 1 capsule (100 mg total) by mouth 2 (two) times daily. 03/30/15   Margarita Grizzle, MD  fluconazole (DIFLUCAN) 150 MG tablet Take 1 tablet (150 mg total) by mouth once. Can take additional dose three days later if symptoms persist 02/04/15   Katrinka Blazing, [place], CNM  insulin aspart (NOVOLOG FLEXPEN) 100 UNIT/ML FlexPen Inject 12 Units into the skin 3 (three) times daily with meals. 04/10/17   Benjiman Core, MD  insulin detemir (LEVEMIR) 100 UNIT/ML injection Inject 35 Units into the skin daily.    [provider]  insulin glargine (LANTUS) 100 UNIT/ML injection Inject 55 Units into the skin at bedtime.    [provider]  Insulin Pen Needle (NOVOFINE) 30G X 8 MM MISC Inject 10 each into the skin as needed. 07/27/13   Russella Dar, NP  Insulin Pen Needle (NOVOFINE) 30G X 8 MM MISC Inject 10 each into the skin as needed. 07/27/13   Russella Dar, NP  oxyCODONE-acetaminophen (PERCOCET/ROXICET) 5-325 MG per tablet Take 1 tablet by mouth every 4 (four) hours as needed for severe pain. 03/30/15   Margarita Grizzle, MD    Family History Family History  Problem Relation Age of Onset  . Diabetes Mother     Social History Social History   Tobacco Use  .  Smoking status: Current Every Day Smoker    Types: Cigarettes  Substance Use Topics  . Alcohol use: No  . Drug use: No     Allergies   Patient has no known allergies.   Review of Systems Review of Systems  All other systems reviewed and are negative.    Physical Exam Updated Vital Signs BP 128/86   Pulse 100   Temp 98.7 F (37.1 C) (Oral)   Resp (!) 22   SpO2 97%   Physical Exam  Constitutional: She is oriented to person, place, and time. She appears well-developed and well-nourished. No distress.  HENT:  Head: Normocephalic and atraumatic.  Neck: Normal range of motion. Neck supple.  Cardiovascular: Normal rate and regular rhythm. Exam reveals no gallop and no friction rub.  No murmur  heard. Pulmonary/Chest: Effort normal and breath sounds normal. No respiratory distress. She has no wheezes.  Abdominal: Soft. Bowel sounds are normal. She exhibits no distension. There is no tenderness.  Musculoskeletal: Normal range of motion.  Neurological: She is alert and oriented to person, place, and time.  Skin: Skin is warm and dry. She is not diaphoretic.  Nursing note and vitals reviewed.    ED Treatments / Results  Labs (all labs ordered are listed, but only abnormal results are displayed) Labs Reviewed  BASIC METABOLIC PANEL - Abnormal; Notable for the following components:      Result Value   Sodium 133 (*)    Chloride 99 (*)    Glucose, Bld 484 (*)    All other components within normal limits  URINALYSIS, ROUTINE W REFLEX MICROSCOPIC - Abnormal; Notable for the following components:   Glucose, UA >=500 (*)    Ketones, ur 40 (*)    All other components within normal limits  URINALYSIS, MICROSCOPIC (REFLEX) - Abnormal; Notable for the following components:   Bacteria, UA RARE (*)    Squamous Epithelial / LPF 0-5 (*)    All other components within normal limits  CBG MONITORING, ED - Abnormal; Notable for the following components:   Glucose-Capillary 468 (*)    All other components within normal limits  CBC  PREGNANCY, URINE  CBG MONITORING, ED    EKG  EKG Interpretation None       Radiology No results found.  Procedures Procedures (including critical care time)  Medications Ordered in ED Medications  ketorolac (TORADOL) 30 MG/ML injection 30 mg (not administered)  promethazine (PHENERGAN) injection 12.5 mg (not administered)  insulin regular (NOVOLIN R,HUMULIN R) 100 units/mL injection 8 Units (not administered)  sodium chloride 0.9 % bolus 1,000 mL (not administered)  ondansetron (ZOFRAN) injection 4 mg (4 mg Intravenous Given 10/29/17 0013)     Initial Impression / Assessment and Plan / ED Course  I have reviewed the triage vital signs and the  nursing notes.  Pertinent labs & imaging results that were available during my care of the patient were reviewed by me and considered in my medical decision making (see chart for details).  Patient with history of type 1 diabetes presenting for evaluation of upper abdominal discomfort and vomiting.  She recently underwent extensive workup at [facility] including CT scan, pelvic exam, and laboratory studies.  No definite cause was found.  I am uncertain as to whether her symptoms are caused by some type of viral illness or possibly developing gastroparesis, however she is feeling better after IV fluids and antiemetics.  I see no reason for further workup at this time.  She will  be discharged, to follow-up as needed for any problems.  She was given 2 L of normal saline along with insulin, Zofran, and Phenergan.  Her sugars are now in the low 300s and significantly improving.  Final Clinical Impressions(s) / ED Diagnoses   Final diagnoses:  None    ED Discharge Orders    None       Geoffery Lyonselo, [place] Scorsone, MD 10/29/17 [phone]

## 2017-10-29 NOTE — ED Notes (Signed)
Pt states she has been on the keto diet for two weeks, but her CBGs have been higher than normal. Pt endorses decreased appetite

## 2017-10-29 NOTE — Discharge Instructions (Signed)
Phenergan as prescribed as needed for nausea.  Follow-up with your primary doctor in the next 3-4 days, and return to the ER if symptoms significantly worsen or change in the meantime.

## 2020-06-03 ENCOUNTER — Encounter (HOSPITAL_BASED_OUTPATIENT_CLINIC_OR_DEPARTMENT_OTHER): Payer: Self-pay | Admitting: *Deleted

## 2020-06-03 ENCOUNTER — Other Ambulatory Visit: Payer: Self-pay

## 2020-06-03 ENCOUNTER — Emergency Department (HOSPITAL_BASED_OUTPATIENT_CLINIC_OR_DEPARTMENT_OTHER)
Admission: EM | Admit: 2020-06-03 | Discharge: 2020-06-03 | Disposition: A | Discharge status: Home / Self Care | Payer: Medicaid Other | Attending: Emergency Medicine | Admitting: Emergency Medicine

## 2020-06-03 DIAGNOSIS — M545 Low back pain: Secondary | ICD-10-CM | POA: Insufficient documentation

## 2020-06-03 DIAGNOSIS — F17210 Nicotine dependence, cigarettes, uncomplicated: Secondary | ICD-10-CM | POA: Diagnosis not present

## 2020-06-03 DIAGNOSIS — Z20822 Contact with and (suspected) exposure to covid-19: Secondary | ICD-10-CM | POA: Diagnosis not present

## 2020-06-03 DIAGNOSIS — G8929 Other chronic pain: Secondary | ICD-10-CM | POA: Insufficient documentation

## 2020-06-03 DIAGNOSIS — R739 Hyperglycemia, unspecified: Secondary | ICD-10-CM | POA: Diagnosis not present

## 2020-06-03 DIAGNOSIS — Z79899 Other long term (current) drug therapy: Secondary | ICD-10-CM | POA: Diagnosis not present

## 2020-06-03 DIAGNOSIS — R112 Nausea with vomiting, unspecified: Secondary | ICD-10-CM | POA: Insufficient documentation

## 2020-06-03 DIAGNOSIS — Z794 Long term (current) use of insulin: Secondary | ICD-10-CM | POA: Diagnosis not present

## 2020-06-03 LAB — BASIC METABOLIC PANEL
Anion gap: 13 (ref 5–15)
BUN: 21 mg/dL — ABNORMAL HIGH (ref 6–20)
CO2: 23 mmol/L (ref 22–32)
Calcium: 9.3 mg/dL (ref 8.9–10.3)
Chloride: 96 mmol/L — ABNORMAL LOW (ref 98–111)
Creatinine, Ser: 0.51 mg/dL (ref 0.44–1.00)
GFR calc Af Amer: >60 mL/min (ref >60)
GFR calc non Af Amer: >60 mL/min (ref >60)
Glucose, Bld: 542 mg/dL (ref 70–99)
Potassium: 4.4 mmol/L (ref 3.5–5.1)
Sodium: 132 mmol/L — ABNORMAL LOW (ref 135–145)

## 2020-06-03 LAB — CBC WITH DIFFERENTIAL/PLATELET
Abs Immature Granulocytes: 0.04 10*3/uL (ref 0.00–0.07)
Basophils Absolute: 0.1 10*3/uL (ref 0.0–0.1)
Basophils Relative: 1 %
Eosinophils Absolute: 0.2 10*3/uL (ref 0.0–0.5)
Eosinophils Relative: 2 %
HCT: 39.6 % (ref 36.0–46.0)
Hemoglobin: 13.4 g/dL (ref 12.0–15.0)
Immature Granulocytes: 1 %
Lymphocytes Relative: 24 %
Lymphs Abs: 2.2 10*3/uL (ref 0.7–4.0)
MCH: 28.9 pg (ref 26.0–34.0)
MCHC: 33.8 g/dL (ref 30.0–36.0)
MCV: 85.3 fL (ref 80.0–100.0)
Monocytes Absolute: 0.5 10*3/uL (ref 0.1–1.0)
Monocytes Relative: 6 %
Neutro Abs: 6 10*3/uL (ref 1.7–7.7)
Neutrophils Relative %: 66 %
Platelets: 237 10*3/uL (ref 150–400)
RBC: 4.64 MIL/uL (ref 3.87–5.11)
RDW: 12.1 % (ref 11.5–15.5)
WBC: 8.9 10*3/uL (ref 4.0–10.5)
nRBC: 0 % (ref 0.0–0.2)

## 2020-06-03 LAB — I-STAT ARTERIAL BLOOD GAS, ED
Acid-Base Excess: 0 mmol/L (ref 0.0–2.0)
Bicarbonate: 24.8 mmol/L (ref 20.0–28.0)
Calcium, Ion: 1.28 mmol/L (ref 1.15–1.40)
HCT: 41 % (ref 36.0–46.0)
Hemoglobin: 13.9 g/dL (ref 12.0–15.0)
O2 Saturation: 98 %
Patient temperature: 98.1
Potassium: 4.1 mmol/L (ref 3.5–5.1)
Sodium: 132 mmol/L — ABNORMAL LOW (ref 135–145)
TCO2: 26 mmol/L (ref 22–32)
pCO2 arterial: 38.7 mmHg (ref 32.0–48.0)
pH, Arterial: 7.413 (ref 7.350–7.450)
pO2, Arterial: 97 mmHg (ref 83.0–108.0)

## 2020-06-03 LAB — URINALYSIS, MICROSCOPIC (REFLEX): WBC, UA: NONE SEEN WBC/hpf (ref 0–5)

## 2020-06-03 LAB — PREGNANCY, URINE: Preg Test, Ur: NEGATIVE

## 2020-06-03 LAB — URINALYSIS, ROUTINE W REFLEX MICROSCOPIC
Bilirubin Urine: NEGATIVE
Glucose, UA: >=500 mg/dL — AB
Ketones, ur: 15 mg/dL — AB
Leukocytes,Ua: NEGATIVE
Nitrite: NEGATIVE
Protein, ur: NEGATIVE mg/dL
Specific Gravity, Urine: 1.015 (ref 1.005–1.030)
pH: 5.5 (ref 5.0–8.0)

## 2020-06-03 LAB — SARS CORONAVIRUS 2 BY RT PCR (HOSPITAL ORDER, PERFORMED IN ~~LOC~~ HOSPITAL LAB): SARS Coronavirus 2: NEGATIVE

## 2020-06-03 LAB — CBG MONITORING, ED
Glucose-Capillary: 312 mg/dL — ABNORMAL HIGH (ref 70–99)
Glucose-Capillary: 443 mg/dL — ABNORMAL HIGH (ref 70–99)
Glucose-Capillary: 541 mg/dL (ref 70–99)

## 2020-06-03 MED ORDER — SODIUM CHLORIDE 0.9 % IV BOLUS (SEPSIS)
1000.0000 mL | Freq: Once | INTRAVENOUS | Status: AC
Start: 2020-06-03 — End: 2020-06-03
  Administered 2020-06-03: 1000 mL via INTRAVENOUS

## 2020-06-03 MED ORDER — MORPHINE SULFATE (PF) 4 MG/ML IV SOLN
4.0000 mg | Freq: Once | INTRAVENOUS | Status: AC
  Administered 2020-06-03: 4 mg via INTRAVENOUS
  Filled 2020-06-03: qty 1

## 2020-06-03 MED ORDER — PROMETHAZINE HCL 25 MG/ML IJ SOLN
12.5000 mg | Freq: Once | INTRAMUSCULAR | Status: AC
  Administered 2020-06-03: 12.5 mg via INTRAVENOUS
  Filled 2020-06-03: qty 1

## 2020-06-03 MED ORDER — CYCLOBENZAPRINE HCL 10 MG PO TABS: 10.0000 mg | ORAL_TABLET | Freq: Every day | ORAL | 0 refills | Status: AC

## 2020-06-03 MED ORDER — INSULIN REGULAR HUMAN 100 UNIT/ML IJ SOLN
10.0000 [IU] | Freq: Once | INTRAMUSCULAR | Status: DC
  Filled 2020-06-03: qty 3

## 2020-06-03 MED ORDER — FENTANYL CITRATE (PF) 100 MCG/2ML IJ SOLN
50.0000 ug | Freq: Once | INTRAMUSCULAR | Status: AC
  Administered 2020-06-03: 50 ug via INTRAVENOUS
  Filled 2020-06-03: qty 2

## 2020-06-03 MED ORDER — INSULIN ASPART 100 UNIT/ML ~~LOC~~ SOLN: 10.0000 [IU] | Freq: Once | SUBCUTANEOUS | Status: AC

## 2020-06-03 MED ORDER — SODIUM CHLORIDE 0.9 % IV SOLN
1000.0000 mL | INTRAVENOUS | Status: DC
  Administered 2020-06-03: 1000 mL via INTRAVENOUS

## 2020-06-03 MED ORDER — INSULIN ASPART 100 UNIT/ML ~~LOC~~ SOLN
SUBCUTANEOUS | Status: AC
  Administered 2020-06-03: 10 [IU] via SUBCUTANEOUS
  Filled 2020-06-03: qty 10

## 2020-06-03 MED ORDER — SODIUM CHLORIDE 0.9 % IV BOLUS (SEPSIS)
1000.0000 mL | Freq: Once | INTRAVENOUS | Status: AC
  Administered 2020-06-03: 1000 mL via INTRAVENOUS

## 2020-06-03 MED ORDER — KETOROLAC TROMETHAMINE 15 MG/ML IJ SOLN
15.0000 mg | Freq: Once | INTRAMUSCULAR | Status: AC
  Administered 2020-06-03: 15 mg via INTRAVENOUS
  Filled 2020-06-03: qty 1

## 2020-06-03 NOTE — ED Notes (Signed)
ED Provider at bedside. 

## 2020-06-03 NOTE — ED Provider Notes (Signed)
Medical Decision Making: Care of patient assumed from Dr. Aleene Davidson at 0700.  Agree with history, physical exam and plan.  See their note for further details.  Briefly, The pt p/w history of type 1 diabetes having nausea vomiting given IV fluids and Phenergan with symptomatic improvement.  Is out of medications at home.  Got insulin here.  Sleeping comfortably..   Current plan is as follows: He is to be seen by case management to make sure no medications are available she will likely be safe for discharge  Case management looked the chart and noted that the patient has Medicaid and that she should be able to get her prescriptions without any cost to her.  Is up to her to call her doctor to get refills.  She is given pain medication for her back, Toradol.  She is instructed to take over-the-counter anti-inflammatories and she is given Flexeril by previous team.  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  I personally reviewed and interpreted all labs/imaging.      Sabino Donovan, MD 06/03/20 [phone]

## 2020-06-03 NOTE — ED Provider Notes (Signed)
Attestation: Medical screening examination/treatment/procedure(s) were conducted as a shared visit with non-physician practitioner(s) and myself.  I personally evaluated the patient during the encounter.   Briefly, the patient is a 29 y.o. female with h/o type 1 diabetes on insulin, here for elevated blood sugars with nausea and vomiting.  Patient has had difficulty getting her medications due to financial constraints.   Vitals:   06/03/20 0700 06/03/20 0730  BP: 112/81 118/73  Pulse: 82 86  Resp: (!) 21 19  Temp:    SpO2: 99% 100%    CONSTITUTIONAL: Nontoxic-appearing, NAD NEURO:  Alert and oriented x 3, no focal deficits EYES:  pupils equal and reactive ENT/NECK:  trachea midline, no JVD CARDIO: Regular rate, regular rhythm, well-perfused PULM: None labored breathing GI/GU:  Abdomen non-distended MSK/SPINE:  No gross deformities, no edema SKIN:  no rash, atraumatic PSYCH:  Appropriate speech and behavior   EKG Interpretation  Date/Time:    Ventricular Rate:    PR Interval:    QRS Duration:   QT Interval:    QTC Calculation:   R Axis:     Text Interpretation:         Work up consistent with hyperglycemia without evidence of DKA.  No infection.  Treated with IV fluids and insulin. SW for assistance with medication.      Nira Conn, MD 06/03/20 [phone]

## 2020-06-03 NOTE — ED Provider Notes (Signed)
[facility] EMERGENCY DEPARTMENT Provider Note   CSN: [id number] Arrival date & time: 06/03/20  0004     History Chief Complaint  Patient presents with  . Hyperglycemia    Julie [name] is a 29 y.o. female.  Patient presents to the emergency department with chief complaint of nausea and vomiting.  She has history of type 1 diabetes, and has been out of her insulin for about a week.  She reports increasingly frequent episodes of nausea and vomiting.  She also reports chronic low back pain, which has been exacerbated by the retching.  She had surgery on her spine about a year ago.  She has some residual left lower extremity weakness from the surgery.  She denies any fevers or chills.  The history is provided by the patient. No language interpreter was used.       Past Medical History:  Diagnosis Date  . Bulimia nervosa   . Dental caries   . Diabetes mellitus without complication (HCC)   . Genital warts   . Noncompliance with diabetes treatment     Patient Active Problem List   Diagnosis Date Noted  . Back pain 07/26/2013  . Headache(784.0) 06/22/2013  . Depressive disorder, not elsewhere classified 06/22/2013  . Dehydration 04/02/2013  . Rt flank pain 04/01/2013  . H/O medication noncompliance 04/01/2013  . Left leg weakness 02/25/2013  . Pustular rash 02/23/2013  . Gingivitis 10/24/2012  . DKA, type 1 (HCC) 10/22/2012  . Hypotension 10/22/2012  . Hypokalemia 10/22/2012    Past Surgical History:  Procedure Laterality Date  . CESAREAN SECTION    . CESAREAN SECTION  2012     OB History    Gravida  1   Para      Term      Preterm      AB  1   Living        SAB  1   TAB      Ectopic      Multiple      Live Births              Family History  Problem Relation Age of Onset  . Diabetes Mother     Social History   Tobacco Use  . Smoking status: Current Every Day Smoker    Types: Cigarettes  Substance Use Topics  .  Alcohol use: No  . Drug use: No    Home Medications Prior to Admission medications   Medication Sig Start Date End Date Taking? Authorizing Provider  cephALEXin (KEFLEX) 500 MG capsule Take 1 capsule (500 mg total) by mouth 4 (four) times daily. 04/10/17   Benjiman Core, MD  doxycycline (VIBRAMYCIN) 100 MG capsule Take 1 capsule (100 mg total) by mouth 2 (two) times daily. 03/30/15   Margarita Grizzle, MD  fluconazole (DIFLUCAN) 150 MG tablet Take 1 tablet (150 mg total) by mouth once. Can take additional dose three days later if symptoms persist 02/04/15   Katrinka Blazing, [place], CNM  insulin aspart (NOVOLOG FLEXPEN) 100 UNIT/ML FlexPen Inject 12 Units into the skin 3 (three) times daily with meals. 04/10/17   Benjiman Core, MD  insulin detemir (LEVEMIR) 100 UNIT/ML injection Inject 35 Units into the skin daily.    [provider]  insulin glargine (LANTUS) 100 UNIT/ML injection Inject 55 Units into the skin at bedtime.    [provider]  Insulin Pen Needle (NOVOFINE) 30G X 8 MM MISC Inject 10 each into the skin as needed. 07/27/13  Russella Dar, NP  Insulin Pen Needle (NOVOFINE) 30G X 8 MM MISC Inject 10 each into the skin as needed. 07/27/13   Russella Dar, NP  oxyCODONE-acetaminophen (PERCOCET/ROXICET) 5-325 MG per tablet Take 1 tablet by mouth every 4 (four) hours as needed for severe pain. 03/30/15   Margarita Grizzle, MD  promethazine (PHENERGAN) 25 MG tablet Take 1 tablet (25 mg total) by mouth every 6 (six) hours as needed for nausea. 10/29/17   Geoffery Lyons, MD    Allergies    Patient has no known allergies.  Review of Systems   Review of Systems  All other systems reviewed and are negative.   Physical Exam Updated Vital Signs BP (!) 137/96   Pulse (!) 102   Temp 98.5 F (36.9 C)   Ht 5\' 11"  (1.803 m)   Wt 72.6 kg   SpO2 100%   BMI 22.32 kg/m   Physical Exam Vitals and nursing note reviewed.  Constitutional:      General: She is not in acute  distress.    Appearance: She is well-developed.     Comments: Uncomfortable appearing  HENT:     Head: Normocephalic and atraumatic.  Eyes:     Conjunctiva/sclera: Conjunctivae normal.  Cardiovascular:     Rate and Rhythm: Regular rhythm. Tachycardia present.     Heart sounds: No murmur heard.   Pulmonary:     Effort: Pulmonary effort is normal. No respiratory distress.     Breath sounds: Normal breath sounds.  Abdominal:     Palpations: Abdomen is soft.     Tenderness: There is no abdominal tenderness.  Musculoskeletal:     Cervical back: Neck supple.     Comments: Residual left lower extremity weakness (baseline)  Skin:    General: Skin is warm and dry.     Comments: Midline L-spine incision scar, no excessive warmth or erythema  Neurological:     Mental Status: She is alert.     ED Results / Procedures / Treatments   Labs (all labs ordered are listed, but only abnormal results are displayed) Labs Reviewed  CBG MONITORING, ED - Abnormal; Notable for the following components:      Result Value   Glucose-Capillary 541 (*)    All other components within normal limits  CBC WITH DIFFERENTIAL/PLATELET  BASIC METABOLIC PANEL  URINALYSIS, ROUTINE W REFLEX MICROSCOPIC  PREGNANCY, URINE  I-STAT ARTERIAL BLOOD GAS, ED    EKG None  Radiology No results found.  Procedures Procedures (including critical care time)  Medications Ordered in ED Medications  morphine 4 MG/ML injection 4 mg (has no administration in time range)  promethazine (PHENERGAN) injection 12.5 mg (has no administration in time range)    ED Course  I have reviewed the triage vital signs and the nursing notes.  Pertinent labs & imaging results that were available during my care of the patient were reviewed by me and considered in my medical decision making (see chart for details).    MDM Rules/Calculators/A&P                          This patient complains of nausea and vomiting and  hyperglycemia along with chronic low back pain, this involves an extensive number of treatment options, and is a complaint that carries with it a high risk of complications and morbidity.    Differential Dx DKA, hyperglycemia, diabetic gastroparesis,  Pertinent Labs I ordered, reviewed, and interpreted labs, which  included CBG 541, pregnancy test negative, arterial pH 7.413, sodium 132, potassium 4.4  Medications I ordered medication fluids, Zofran, and morphine for hyperglycemia, vomiting, and low back pain.  Reassessments After the interventions stated above, I reevaluated the patient and found feeling much better, no longer vomiting, glucose trending down.  Consultants Transition of care team for medication assistance  Plan Plan for discharge after [facility] consult.  Patient signed out to Dr. Eudelia Bunch at shift change.    Final Clinical Impression(s) / ED Diagnoses Final diagnoses:  Hyperglycemia    Rx / DC Orders ED Discharge Orders    None       Roxy Horseman, PA-C 06/03/20 1610    Nira Conn, MD 06/03/20 [phone]

## 2020-06-03 NOTE — Discharge Instructions (Addendum)
You can take 600 mg of ibuprofen every 6 hours, you can take 1000 mg of Tylenol every 6 hours, you can alternate these every 3 or you can take them together.  

## 2020-06-03 NOTE — ED Triage Notes (Addendum)
Pt c/o increased BS with N/v  X 5 days out of meds x 1 week

## 2020-06-03 NOTE — Discharge Planning (Signed)
RNCM consulted in regards to medication assistance.  Pt has insurance coverage and is not eligible for Medication Assistance Through [facility]) program.  RNCM relayed information to care team.  No further CM needs communicated at this time.

## 2020-09-06 ENCOUNTER — Other Ambulatory Visit: Payer: Self-pay

## 2020-09-06 ENCOUNTER — Encounter (HOSPITAL_BASED_OUTPATIENT_CLINIC_OR_DEPARTMENT_OTHER): Payer: Self-pay | Admitting: Emergency Medicine

## 2020-09-06 DIAGNOSIS — R6883 Chills (without fever): Secondary | ICD-10-CM | POA: Insufficient documentation

## 2020-09-06 DIAGNOSIS — R059 Cough, unspecified: Secondary | ICD-10-CM | POA: Diagnosis not present

## 2020-09-06 DIAGNOSIS — E1065 Type 1 diabetes mellitus with hyperglycemia: Secondary | ICD-10-CM | POA: Insufficient documentation

## 2020-09-06 DIAGNOSIS — R079 Chest pain, unspecified: Secondary | ICD-10-CM | POA: Insufficient documentation

## 2020-09-06 DIAGNOSIS — R112 Nausea with vomiting, unspecified: Secondary | ICD-10-CM | POA: Insufficient documentation

## 2020-09-06 DIAGNOSIS — F17210 Nicotine dependence, cigarettes, uncomplicated: Secondary | ICD-10-CM | POA: Insufficient documentation

## 2020-09-06 DIAGNOSIS — Z20822 Contact with and (suspected) exposure to covid-19: Secondary | ICD-10-CM | POA: Insufficient documentation

## 2020-09-06 DIAGNOSIS — R1110 Vomiting, unspecified: Secondary | ICD-10-CM | POA: Diagnosis present

## 2020-09-06 DIAGNOSIS — R1013 Epigastric pain: Secondary | ICD-10-CM | POA: Diagnosis not present

## 2020-09-06 NOTE — ED Triage Notes (Signed)
Reports emesis, shortness of breath, abd pain, fevers, back pain onset today.

## 2020-09-07 ENCOUNTER — Emergency Department (HOSPITAL_BASED_OUTPATIENT_CLINIC_OR_DEPARTMENT_OTHER)
Admission: EM | Admit: 2020-09-07 | Discharge: 2020-09-07 | Disposition: A | Discharge status: Home / Self Care | Payer: Medicaid Other | Attending: Emergency Medicine | Admitting: Emergency Medicine

## 2020-09-07 DIAGNOSIS — R112 Nausea with vomiting, unspecified: Secondary | ICD-10-CM

## 2020-09-07 DIAGNOSIS — R739 Hyperglycemia, unspecified: Secondary | ICD-10-CM

## 2020-09-07 LAB — URINALYSIS, MICROSCOPIC (REFLEX)
Bacteria, UA: NONE SEEN
WBC, UA: NONE SEEN WBC/hpf (ref 0–5)

## 2020-09-07 LAB — CBC WITH DIFFERENTIAL/PLATELET
Abs Immature Granulocytes: 0.04 10*3/uL (ref 0.00–0.07)
Basophils Absolute: 0 10*3/uL (ref 0.0–0.1)
Basophils Relative: 1 %
Eosinophils Absolute: 0.4 10*3/uL (ref 0.0–0.5)
Eosinophils Relative: 5 %
HCT: 39.3 % (ref 36.0–46.0)
Hemoglobin: 13.3 g/dL (ref 12.0–15.0)
Immature Granulocytes: 1 %
Lymphocytes Relative: 41 %
Lymphs Abs: 3.4 10*3/uL (ref 0.7–4.0)
MCH: 28.5 pg (ref 26.0–34.0)
MCHC: 33.8 g/dL (ref 30.0–36.0)
MCV: 84.3 fL (ref 80.0–100.0)
Monocytes Absolute: 0.5 10*3/uL (ref 0.1–1.0)
Monocytes Relative: 6 %
Neutro Abs: 4 10*3/uL (ref 1.7–7.7)
Neutrophils Relative %: 46 %
Platelets: 226 10*3/uL (ref 150–400)
RBC: 4.66 MIL/uL (ref 3.87–5.11)
RDW: 13.1 % (ref 11.5–15.5)
WBC: 8.3 10*3/uL (ref 4.0–10.5)
nRBC: 0 % (ref 0.0–0.2)

## 2020-09-07 LAB — COMPREHENSIVE METABOLIC PANEL
ALT: 13 U/L (ref 0–44)
AST: 13 U/L — ABNORMAL LOW (ref 15–41)
Albumin: 3.9 g/dL (ref 3.5–5.0)
Alkaline Phosphatase: 63 U/L (ref 38–126)
Anion gap: 12 (ref 5–15)
BUN: 19 mg/dL (ref 6–20)
CO2: 22 mmol/L (ref 22–32)
Calcium: 9.6 mg/dL (ref 8.9–10.3)
Chloride: 99 mmol/L (ref 98–111)
Creatinine, Ser: 0.68 mg/dL (ref 0.44–1.00)
GFR, Estimated: >60 mL/min (ref >60)
Glucose, Bld: 576 mg/dL (ref 70–99)
Potassium: 4.6 mmol/L (ref 3.5–5.1)
Sodium: 133 mmol/L — ABNORMAL LOW (ref 135–145)
Total Bilirubin: 0.6 mg/dL (ref 0.3–1.2)
Total Protein: 7.6 g/dL (ref 6.5–8.1)

## 2020-09-07 LAB — URINALYSIS, ROUTINE W REFLEX MICROSCOPIC
Bilirubin Urine: NEGATIVE
Glucose, UA: >=500 mg/dL — AB
Hgb urine dipstick: NEGATIVE
Ketones, ur: NEGATIVE mg/dL
Leukocytes,Ua: NEGATIVE
Nitrite: NEGATIVE
Protein, ur: NEGATIVE mg/dL
Specific Gravity, Urine: <1.005 — ABNORMAL LOW (ref 1.005–1.030)
pH: 6 (ref 5.0–8.0)

## 2020-09-07 LAB — RESP PANEL BY RT-PCR (FLU A&B, COVID) ARPGX2
Influenza A by PCR: NEGATIVE
Influenza B by PCR: NEGATIVE
SARS Coronavirus 2 by RT PCR: NEGATIVE

## 2020-09-07 LAB — I-STAT VENOUS BLOOD GAS, ED
Acid-Base Excess: 3 mmol/L — ABNORMAL HIGH (ref 0.0–2.0)
Bicarbonate: 27.5 mmol/L (ref 20.0–28.0)
Calcium, Ion: 1.31 mmol/L (ref 1.15–1.40)
HCT: 41 % (ref 36.0–46.0)
Hemoglobin: 13.9 g/dL (ref 12.0–15.0)
O2 Saturation: 86 %
Patient temperature: 98.8
Potassium: 5 mmol/L (ref 3.5–5.1)
Sodium: 133 mmol/L — ABNORMAL LOW (ref 135–145)
TCO2: 29 mmol/L (ref 22–32)
pCO2, Ven: 42.8 mmHg — ABNORMAL LOW (ref 44.0–60.0)
pH, Ven: 7.417 (ref 7.250–7.430)
pO2, Ven: 52 mmHg — ABNORMAL HIGH (ref 32.0–45.0)

## 2020-09-07 LAB — CBG MONITORING, ED
Glucose-Capillary: 535 mg/dL (ref 70–99)
Glucose-Capillary: 433 mg/dL — ABNORMAL HIGH (ref 70–99)
Glucose-Capillary: 391 mg/dL — ABNORMAL HIGH (ref 70–99)
Glucose-Capillary: 340 mg/dL — ABNORMAL HIGH (ref 70–99)

## 2020-09-07 LAB — LIPASE, BLOOD: Lipase: 20 U/L (ref 11–51)

## 2020-09-07 LAB — PREGNANCY, URINE: Preg Test, Ur: NEGATIVE

## 2020-09-07 MED ORDER — LIDOCAINE VISCOUS HCL 2 % MT SOLN
15.0000 mL | Freq: Once | OROMUCOSAL | Status: AC
  Administered 2020-09-07: 15 mL via ORAL
  Filled 2020-09-07: qty 15

## 2020-09-07 MED ORDER — INSULIN ASPART 100 UNIT/ML IV SOLN
10.0000 [IU] | Freq: Once | INTRAVENOUS | Status: DC
  Filled 2020-09-07: qty 0.1

## 2020-09-07 MED ORDER — ALUM & MAG HYDROXIDE-SIMETH 200-200-20 MG/5ML PO SUSP
30.0000 mL | Freq: Once | ORAL | Status: AC
  Administered 2020-09-07: 30 mL via ORAL
  Filled 2020-09-07: qty 30

## 2020-09-07 MED ORDER — SUCRALFATE 1 GM/10ML PO SUSP
1.0000 g | Freq: Once | ORAL | Status: AC
  Administered 2020-09-07: 1 g via ORAL
  Filled 2020-09-07: qty 10

## 2020-09-07 MED ORDER — KETOROLAC TROMETHAMINE 15 MG/ML IJ SOLN
15.0000 mg | Freq: Once | INTRAMUSCULAR | Status: AC
  Administered 2020-09-07: 15 mg via INTRAVENOUS
  Filled 2020-09-07: qty 1

## 2020-09-07 MED ORDER — SODIUM CHLORIDE 0.9 % IV BOLUS (SEPSIS)
1000.0000 mL | Freq: Once | INTRAVENOUS | Status: AC
  Administered 2020-09-07: 1000 mL via INTRAVENOUS

## 2020-09-07 MED ORDER — PROMETHAZINE HCL 25 MG RE SUPP: 25.0000 mg | Freq: Four times a day (QID) | RECTAL | 0 refills | Status: DC | PRN

## 2020-09-07 MED ORDER — INSULIN ASPART 100 UNIT/ML ~~LOC~~ SOLN
10.0000 [IU] | Freq: Once | SUBCUTANEOUS | Status: AC
  Administered 2020-09-07: 10 [IU] via INTRAVENOUS
  Filled 2020-09-07: qty 10

## 2020-09-07 MED ORDER — PROMETHAZINE HCL 25 MG/ML IJ SOLN
25.0000 mg | Freq: Once | INTRAMUSCULAR | Status: AC
  Administered 2020-09-07: 25 mg via INTRAVENOUS
  Filled 2020-09-07: qty 1

## 2020-09-07 MED ORDER — METOCLOPRAMIDE HCL 5 MG/ML IJ SOLN
10.0000 mg | Freq: Once | INTRAMUSCULAR | Status: AC
  Administered 2020-09-07: 10 mg via INTRAVENOUS
  Filled 2020-09-07: qty 2

## 2020-09-07 MED ORDER — SODIUM CHLORIDE 0.9 % IV SOLN: 1000.0000 mL | INTRAVENOUS | Status: DC

## 2020-09-07 MED ORDER — ONDANSETRON HCL 4 MG/2ML IJ SOLN: 4.0000 mg | Freq: Once | INTRAMUSCULAR | Status: DC

## 2020-09-07 MED ORDER — INSULIN REGULAR HUMAN 100 UNIT/ML IJ SOLN
10.0000 [IU] | Freq: Once | INTRAMUSCULAR | Status: DC
  Filled 2020-09-07: qty 3

## 2020-09-07 MED ORDER — SODIUM CHLORIDE 0.9 % IV BOLUS
1000.0000 mL | Freq: Once | INTRAVENOUS | Status: AC
  Administered 2020-09-07: 1000 mL via INTRAVENOUS

## 2020-09-07 MED ORDER — ACETAMINOPHEN 500 MG PO TABS
1000.0000 mg | ORAL_TABLET | Freq: Once | ORAL | Status: DC
  Filled 2020-09-07: qty 2

## 2020-09-07 NOTE — ED Provider Notes (Signed)
[facility] EMERGENCY DEPARTMENT Provider Note  CSN: [id number] Arrival date & time: 09/06/20 2315  Chief Complaint(s) covid symptoms  HPI Julie [name] is a 29 y.o. female with a history of type 1 diabetes here for several hours of epigastric abdominal pain described as sharp and burning that radiates up to chest with associated nonbloody nonbilious emesis and inability to tolerate oral intake. Pain worse with emesis. Julie [name] is endorsing chills but no fevers. Mild cough after emesis. No urinary symptoms. No diarrhea. Possible suspicious food intake. Daughter with similar symptoms.   HPI  Past Medical History Past Medical History:  Diagnosis Date   Bulimia nervosa    Dental caries    Diabetes mellitus without complication (HCC)    Genital warts    Noncompliance with diabetes treatment    Patient Active Problem List   Diagnosis Date Noted   Back pain 07/26/2013   Headache(784.0) 06/22/2013   Depressive disorder, not elsewhere classified 06/22/2013   Dehydration 04/02/2013   Rt flank pain 04/01/2013   H/O medication noncompliance 04/01/2013   Left leg weakness 02/25/2013   Pustular rash 02/23/2013   Gingivitis 10/24/2012   DKA, type 1 (HCC) 10/22/2012   Hypotension 10/22/2012   Hypokalemia 10/22/2012   Home Medication(s) Prior to Admission medications   Medication Sig Start Date End Date Taking? Authorizing Provider  ergocalciferol (VITAMIN D2) 1.25 MG (50000 UT) capsule Take by mouth. 10/09/19  Yes [provider]  gabapentin (NEURONTIN) 300 MG capsule  05/21/20  Yes [provider]  traMADol (ULTRAM) 50 MG tablet Take by mouth. 05/13/19  Yes [provider]  cephALEXin (KEFLEX) 500 MG capsule Take 1 capsule (500 mg total) by mouth 4 (four) times daily. 04/10/17   Benjiman Core, MD  clonazePAM (KLONOPIN) 0.5 MG tablet Take 0.5-1 mg by mouth 2 (two) times daily as needed. 08/31/20   [provider]   doxycycline (VIBRAMYCIN) 100 MG capsule Take 1 capsule (100 mg total) by mouth 2 (two) times daily. 03/30/15   Margarita Grizzle, MD  fluconazole (DIFLUCAN) 150 MG tablet Take 1 tablet (150 mg total) by mouth once. Can take additional dose three days later if symptoms persist 02/04/15   Katrinka Blazing, [place], CNM  insulin aspart (NOVOLOG FLEXPEN) 100 UNIT/ML FlexPen Inject 12 Units into the skin 3 (three) times daily with meals. 04/10/17   Benjiman Core, MD  insulin detemir (LEVEMIR) 100 UNIT/ML injection Inject 35 Units into the skin daily.    [provider]  insulin glargine (LANTUS) 100 UNIT/ML injection Inject 55 Units into the skin at bedtime.    [provider]  Insulin Pen Needle (NOVOFINE) 30G X 8 MM MISC Inject 10 each into the skin as needed. 07/27/13   Russella Dar, NP  Insulin Pen Needle (NOVOFINE) 30G X 8 MM MISC Inject 10 each into the skin as needed. 07/27/13   Russella Dar, NP  oxyCODONE-acetaminophen (PERCOCET/ROXICET) 5-325 MG per tablet Take 1 tablet by mouth every 4 (four) hours as needed for severe pain. 03/30/15   Margarita Grizzle, MD  promethazine (PHENERGAN) 25 MG suppository Place 1 suppository (25 mg total) rectally every 6 (six) hours as needed for nausea or vomiting. 09/07/20   Malayah Demuro, Amadeo Garnet, MD  promethazine (PHENERGAN) 25 MG tablet Take 1 tablet (25 mg total) by mouth every 6 (six) hours as needed for nausea. 10/29/17   Geoffery Lyons, MD  Past Surgical History Past Surgical History:  Procedure Laterality Date   CESAREAN SECTION     CESAREAN SECTION  2012   Family History Family History  Problem Relation Age of Onset   Diabetes Mother     Social History Social History   Tobacco Use   Smoking status: Current Every Day Smoker    Types: Cigarettes   Smokeless tobacco: Never Used  Substance Use Topics    Alcohol use: No   Drug use: No   Allergies Patient has no known allergies.  Review of Systems Review of Systems All other systems are reviewed and are negative for acute change except as noted in the HPI  Physical Exam Vital Signs  I have reviewed the triage vital signs BP 110/84    Pulse 85    Temp 98.5 F (36.9 C) (Oral)    Resp 14    Ht 5\' 11"  (1.803 m)    Wt 75.3 kg    SpO2 97%    BMI 23.15 kg/m   Physical Exam Vitals reviewed.  Constitutional:      General: Julie [name] is not in acute distress.    Appearance: Julie [name] is well-developed and well-nourished. Julie [name] is not diaphoretic.  HENT:     Head: Normocephalic and atraumatic.     Nose: Nose normal.  Eyes:     General: No scleral icterus.       Right eye: No discharge.        Left eye: No discharge.     Extraocular Movements: EOM normal.     Conjunctiva/sclera: Conjunctivae normal.     Pupils: Pupils are equal, round, and reactive to light.  Cardiovascular:     Rate and Rhythm: Normal rate and regular rhythm.     Heart sounds: No murmur heard. No friction rub. No gallop.   Pulmonary:     Effort: Pulmonary effort is normal. No respiratory distress.     Breath sounds: Normal breath sounds. No stridor. No rales.  Abdominal:     General: There is no distension.     Palpations: Abdomen is soft.     Tenderness: There is abdominal tenderness in the epigastric area. There is no guarding or rebound.  Musculoskeletal:        General: No tenderness or edema.     Cervical back: Normal range of motion and neck supple.  Skin:    General: Skin is warm and dry.     Findings: No erythema or rash.  Neurological:     Mental Status: Julie [name] is alert and oriented to person, place, and time.  Psychiatric:        Mood and Affect: Mood and affect normal.     ED Results and Treatments Labs (all labs ordered are listed, but only abnormal results are displayed) Labs Reviewed  COMPREHENSIVE METABOLIC PANEL - Abnormal; Notable for the following  components:      Result Value   Sodium 133 (*)    Glucose, Bld 576 (*)    AST 13 (*)    All other components within normal limits  URINALYSIS, ROUTINE W REFLEX MICROSCOPIC - Abnormal; Notable for the following components:   Specific Gravity, Urine <1.005 (*)    Glucose, UA >=500 (*)    All other components within normal limits  CBG MONITORING, ED - Abnormal; Notable for the following components:   Glucose-Capillary 535 (*)    All other components within normal limits  I-STAT VENOUS BLOOD GAS, ED - Abnormal; Notable for the following  components:   pCO2, Ven 42.8 (*)    pO2, Ven 52.0 (*)    Acid-Base Excess 3.0 (*)    Sodium 133 (*)    All other components within normal limits  CBG MONITORING, ED - Abnormal; Notable for the following components:   Glucose-Capillary 433 (*)    All other components within normal limits  CBG MONITORING, ED - Abnormal; Notable for the following components:   Glucose-Capillary 391 (*)    All other components within normal limits  CBG MONITORING, ED - Abnormal; Notable for the following components:   Glucose-Capillary 340 (*)    All other components within normal limits  RESP PANEL BY RT-PCR (FLU A&B, COVID) ARPGX2  CBC WITH DIFFERENTIAL/PLATELET  LIPASE, BLOOD  URINALYSIS, MICROSCOPIC (REFLEX)  PREGNANCY, URINE                                                                                                                         EKG  EKG Interpretation  Date/Time:    Ventricular Rate:    PR Interval:    QRS Duration:   QT Interval:    QTC Calculation:   R Axis:     Text Interpretation:        Radiology No results found.  Pertinent labs & imaging results that were available during my care of the patient were reviewed by me and considered in my medical decision making (see chart for details).  Medications Ordered in ED Medications  sodium chloride 0.9 % bolus 1,000 mL (0 mLs Intravenous Stopped 09/07/20 0338)  alum & mag  hydroxide-simeth (MAALOX/MYLANTA) 200-200-20 MG/5ML suspension 30 mL (30 mLs Oral Given 09/07/20 0420)    And  lidocaine (XYLOCAINE) 2 % viscous mouth solution 15 mL (15 mLs Oral Given 09/07/20 0420)  metoCLOPramide (REGLAN) injection 10 mg (10 mg Intravenous Given 09/07/20 0216)  promethazine (PHENERGAN) injection 25 mg (25 mg Intravenous Given 09/07/20 0335)  insulin aspart (novoLOG) injection 10 Units (10 Units Intravenous Given 09/07/20 0421)  ketorolac (TORADOL) 15 MG/ML injection 15 mg (15 mg Intravenous Given 09/07/20 0443)  sodium chloride 0.9 % bolus 1,000 mL (0 mLs Intravenous Stopped 09/07/20 0741)  sucralfate (CARAFATE) 1 GM/10ML suspension 1 g (1 g Oral Given 09/07/20 0442)  Procedures .Critical Care Performed by: Nira Conn, MD Authorized by: Nira Conn, MD    CRITICAL CARE Performed by: Amadeo Garnet Onix Jumper Total critical care time: 40 minutes Critical care time was exclusive of separately billable procedures and treating other patients. Critical care was necessary to treat or prevent imminent or life-threatening deterioration. Critical care was time spent personally by me on the following activities: development of treatment plan with patient and/or surrogate as well as nursing, discussions with consultants, evaluation of patient's response to treatment, examination of patient, obtaining history from patient or surrogate, ordering and performing treatments and interventions, ordering and review of laboratory studies, ordering and review of radiographic studies, pulse oximetry and re-evaluation of patient's condition.    (including critical care time)  Medical Decision Making / ED Course I have reviewed the nursing notes for this encounter and the patient's prior records (if available in EHR or on provided  paperwork).   Julie [name] was evaluated in Emergency Department on 09/08/2020 for the symptoms described in the history of present illness. Julie [name] was evaluated in the context of the global COVID-19 pandemic, which necessitated consideration that the patient might be at risk for infection with the SARS-CoV-2 virus that causes COVID-19. Institutional protocols and algorithms that pertain to the evaluation of patients at risk for COVID-19 are in a state of rapid change based on information released by regulatory bodies including the CDC and federal and state organizations. These policies and algorithms were followed during the patient's care in the ED.  Epigastric abdominal pain with emesis Tender to palpation in the epigastrium and left quadrant CBG greater than 500  Labs without leukocytosis or anemia.  Notable for hyperglycemia without evidence of DKA. No other electrolyte derangement or renal sufficiency.  No evidence of obstruction or pancreatitis.  UA without evidence of infection.  Patient treated symptomatically with IV fluids, antiemetics and GI cocktail. Patient also given 10 units of insulin  After fluids and insulin hyperglycemia is improving.  Julie [name] is able to tolerate oral intake.  Likely viral vs food poisoning. Doubt serious intra-abdominal inflammatory/infectious process requiring imaging at this time.  Patient reported having a migraine related to all the emesis. Provided with Tylenol and Toradol.        Final Clinical Impression(s) / ED Diagnoses Final diagnoses:  Nausea and vomiting in adult  Hyperglycemia   The patient appears reasonably screened and/or stabilized for discharge and I doubt any other medical condition or other [facility] requiring further screening, evaluation, or treatment in the ED at this time prior to discharge. Safe for discharge with strict return precautions.  Disposition: Discharge  Condition: Good  I have discussed the results, Dx and  Tx plan with the patient/family who expressed understanding and agree(s) with the plan. Discharge instructions discussed at length. The patient/family was given strict return precautions who verbalized understanding of the instructions. No further questions at time of discharge.    ED Discharge Orders         Ordered    promethazine (PHENERGAN) 25 MG suppository  Every 6 hours PRN        09/07/20 0981           Follow Up: Health, [facility] [address] [place] [address] [phone]  Call  To schedule an appointment for close follow up      This chart was dictated using voice recognition software.  Despite best efforts to proofread,  errors can occur which can change the  documentation meaning.   Nira Conn, MD 09/08/20 [phone]

## 2021-05-18 ENCOUNTER — Emergency Department (HOSPITAL_BASED_OUTPATIENT_CLINIC_OR_DEPARTMENT_OTHER)
Admission: EM | Admit: 2021-05-18 | Discharge: 2021-05-18 | Disposition: A | Discharge status: Home / Self Care | Payer: Medicaid Other | Attending: Emergency Medicine | Admitting: Emergency Medicine

## 2021-05-18 ENCOUNTER — Other Ambulatory Visit: Payer: Self-pay

## 2021-05-18 ENCOUNTER — Emergency Department (HOSPITAL_BASED_OUTPATIENT_CLINIC_OR_DEPARTMENT_OTHER): Payer: Medicaid Other

## 2021-05-18 ENCOUNTER — Encounter (HOSPITAL_BASED_OUTPATIENT_CLINIC_OR_DEPARTMENT_OTHER): Payer: Self-pay

## 2021-05-18 DIAGNOSIS — R0682 Tachypnea, not elsewhere classified: Secondary | ICD-10-CM | POA: Insufficient documentation

## 2021-05-18 DIAGNOSIS — R112 Nausea with vomiting, unspecified: Secondary | ICD-10-CM | POA: Insufficient documentation

## 2021-05-18 DIAGNOSIS — R042 Hemoptysis: Secondary | ICD-10-CM | POA: Insufficient documentation

## 2021-05-18 DIAGNOSIS — Z79899 Other long term (current) drug therapy: Secondary | ICD-10-CM | POA: Insufficient documentation

## 2021-05-18 DIAGNOSIS — R0789 Other chest pain: Secondary | ICD-10-CM | POA: Insufficient documentation

## 2021-05-18 DIAGNOSIS — E1010 Type 1 diabetes mellitus with ketoacidosis without coma: Secondary | ICD-10-CM | POA: Diagnosis not present

## 2021-05-18 DIAGNOSIS — Z794 Long term (current) use of insulin: Secondary | ICD-10-CM | POA: Diagnosis not present

## 2021-05-18 DIAGNOSIS — R079 Chest pain, unspecified: Secondary | ICD-10-CM

## 2021-05-18 DIAGNOSIS — F17210 Nicotine dependence, cigarettes, uncomplicated: Secondary | ICD-10-CM | POA: Diagnosis not present

## 2021-05-18 HISTORY — DX: Fracture of unspecified parts of lumbosacral spine and pelvis, initial encounter for closed fracture: S32.9XXA

## 2021-05-18 LAB — COMPREHENSIVE METABOLIC PANEL
ALT: 15 U/L (ref 0–44)
AST: 25 U/L (ref 15–41)
Albumin: 4 g/dL (ref 3.5–5.0)
Alkaline Phosphatase: 106 U/L (ref 38–126)
Anion gap: 15 (ref 5–15)
BUN: 12 mg/dL (ref 6–20)
CO2: 20 mmol/L — ABNORMAL LOW (ref 22–32)
Calcium: 9.2 mg/dL (ref 8.9–10.3)
Chloride: 98 mmol/L (ref 98–111)
Creatinine, Ser: 0.58 mg/dL (ref 0.44–1.00)
GFR, Estimated: >60 mL/min (ref >60)
Glucose, Bld: 308 mg/dL — ABNORMAL HIGH (ref 70–99)
Potassium: 4.3 mmol/L (ref 3.5–5.1)
Sodium: 133 mmol/L — ABNORMAL LOW (ref 135–145)
Total Bilirubin: 0.8 mg/dL (ref 0.3–1.2)
Total Protein: 7.9 g/dL (ref 6.5–8.1)

## 2021-05-18 LAB — I-STAT VENOUS BLOOD GAS, ED
Acid-base deficit: 1 mmol/L (ref 0.0–2.0)
Bicarbonate: 22 mmol/L (ref 20.0–28.0)
Calcium, Ion: 1.05 mmol/L — ABNORMAL LOW (ref 1.15–1.40)
HCT: 47 % — ABNORMAL HIGH (ref 36.0–46.0)
Hemoglobin: 16 g/dL — ABNORMAL HIGH (ref 12.0–15.0)
O2 Saturation: 91 %
Potassium: 4.6 mmol/L (ref 3.5–5.1)
Sodium: 131 mmol/L — ABNORMAL LOW (ref 135–145)
TCO2: 23 mmol/L (ref 22–32)
pCO2, Ven: 31.1 mmHg — ABNORMAL LOW (ref 44.0–60.0)
pH, Ven: 7.456 — ABNORMAL HIGH (ref 7.250–7.430)
pO2, Ven: 56 mmHg — ABNORMAL HIGH (ref 32.0–45.0)

## 2021-05-18 LAB — URINALYSIS, MICROSCOPIC (REFLEX)

## 2021-05-18 LAB — CBC WITH DIFFERENTIAL/PLATELET
Abs Immature Granulocytes: 0.06 10*3/uL (ref 0.00–0.07)
Basophils Absolute: 0.1 10*3/uL (ref 0.0–0.1)
Basophils Relative: 1 %
Eosinophils Absolute: 0.1 10*3/uL (ref 0.0–0.5)
Eosinophils Relative: 1 %
HCT: 45.1 % (ref 36.0–46.0)
Hemoglobin: 15.2 g/dL — ABNORMAL HIGH (ref 12.0–15.0)
Immature Granulocytes: 1 %
Lymphocytes Relative: 22 %
Lymphs Abs: 1.9 10*3/uL (ref 0.7–4.0)
MCH: 28.6 pg (ref 26.0–34.0)
MCHC: 33.7 g/dL (ref 30.0–36.0)
MCV: 84.8 fL (ref 80.0–100.0)
Monocytes Absolute: 0.5 10*3/uL (ref 0.1–1.0)
Monocytes Relative: 5 %
Neutro Abs: 6.4 10*3/uL (ref 1.7–7.7)
Neutrophils Relative %: 70 %
Platelets: 193 10*3/uL (ref 150–400)
RBC: 5.32 MIL/uL — ABNORMAL HIGH (ref 3.87–5.11)
RDW: 13 % (ref 11.5–15.5)
WBC: 9 10*3/uL (ref 4.0–10.5)
nRBC: 0 % (ref 0.0–0.2)

## 2021-05-18 LAB — CBG MONITORING, ED: Glucose-Capillary: 318 mg/dL — ABNORMAL HIGH (ref 70–99)

## 2021-05-18 LAB — URINALYSIS, ROUTINE W REFLEX MICROSCOPIC
Bilirubin Urine: NEGATIVE
Glucose, UA: >=500 mg/dL — AB
Ketones, ur: 80 mg/dL — AB
Leukocytes,Ua: NEGATIVE
Nitrite: NEGATIVE
Protein, ur: 100 mg/dL — AB
Specific Gravity, Urine: 1.025 (ref 1.005–1.030)
pH: 5 (ref 5.0–8.0)

## 2021-05-18 LAB — PREGNANCY, URINE: Preg Test, Ur: NEGATIVE

## 2021-05-18 LAB — TROPONIN I (HIGH SENSITIVITY): Troponin I (High Sensitivity): <2 ng/L (ref <18)

## 2021-05-18 MED ORDER — PROMETHAZINE HCL 25 MG/ML IJ SOLN
INTRAMUSCULAR | Status: AC
  Filled 2021-05-18: qty 1

## 2021-05-18 MED ORDER — PROMETHAZINE-DM 6.25-15 MG/5ML PO SYRP: 1.2500 mL | ORAL_SOLUTION | Freq: Four times a day (QID) | ORAL | 0 refills | Status: DC | PRN

## 2021-05-18 MED ORDER — SODIUM CHLORIDE 0.9 % IV SOLN
12.5000 mg | Freq: Once | INTRAVENOUS | Status: AC
  Administered 2021-05-18: 12.5 mg via INTRAVENOUS
  Filled 2021-05-18: qty 0.5

## 2021-05-18 MED ORDER — MORPHINE SULFATE (PF) 4 MG/ML IV SOLN
4.0000 mg | Freq: Once | INTRAVENOUS | Status: AC
  Administered 2021-05-18: 4 mg via INTRAVENOUS
  Filled 2021-05-18: qty 1

## 2021-05-18 MED ORDER — LIDOCAINE VISCOUS HCL 2 % MT SOLN
15.0000 mL | Freq: Once | OROMUCOSAL | Status: AC
  Administered 2021-05-18: 15 mL via ORAL
  Filled 2021-05-18: qty 15

## 2021-05-18 MED ORDER — ALUM & MAG HYDROXIDE-SIMETH 400-400-40 MG/5ML PO SUSP: 5.0000 mL | ORAL | 0 refills | Status: AC | PRN

## 2021-05-18 MED ORDER — PROMETHAZINE HCL 25 MG/ML IJ SOLN
INTRAMUSCULAR | Status: AC
  Administered 2021-05-18: 25 mg
  Filled 2021-05-18: qty 1

## 2021-05-18 MED ORDER — PROMETHAZINE HCL 25 MG PO TABS: 25.0000 mg | ORAL_TABLET | Freq: Four times a day (QID) | ORAL | 0 refills | Status: DC | PRN

## 2021-05-18 MED ORDER — SODIUM CHLORIDE 0.9 % IV SOLN
12.5000 mg | Freq: Four times a day (QID) | INTRAVENOUS | Status: DC | PRN
  Filled 2021-05-18: qty 0.5

## 2021-05-18 MED ORDER — ALUM & MAG HYDROXIDE-SIMETH 200-200-20 MG/5ML PO SUSP
30.0000 mL | Freq: Once | ORAL | Status: AC
  Administered 2021-05-18: 30 mL via ORAL
  Filled 2021-05-18: qty 30

## 2021-05-18 MED ORDER — SODIUM CHLORIDE 0.9 % IV BOLUS
1000.0000 mL | Freq: Once | INTRAVENOUS | Status: AC
  Administered 2021-05-18: 1000 mL via INTRAVENOUS

## 2021-05-18 MED ORDER — PANTOPRAZOLE SODIUM 40 MG IV SOLR
40.0000 mg | Freq: Once | INTRAVENOUS | Status: AC
  Administered 2021-05-18: 40 mg via INTRAVENOUS
  Filled 2021-05-18: qty 40

## 2021-05-18 MED ORDER — LORAZEPAM 2 MG/ML IJ SOLN
1.0000 mg | Freq: Once | INTRAMUSCULAR | Status: AC
  Administered 2021-05-18: 1 mg via INTRAVENOUS
  Filled 2021-05-18: qty 1

## 2021-05-18 NOTE — ED Provider Notes (Signed)
[facility] EMERGENCY DEPARTMENT Provider Note   CSN: [id number] Arrival date & time: 05/18/21  1655     History Chief Complaint  Patient presents with   Chest Pain    Julie [name] is a 30 y.o. female.  HPI  30 year old female past medical history of DM presents the emergency department with midsternal chest discomfort.  Patient has diagnosed COVID-positive a couple weeks ago.  She has since then developed midsternal chest discomfort and feels very anxious.  Sometimes the pain is worse with swallowing.  She intermittently has shortness of breath.  Endorses cough and hemoptysis.  Was seen yesterday at [facility] for an evaluation, states that her work-up was negative she was sent home however her symptoms persist.  She denies any diarrhea but has had intermittent nausea and vomiting.  Past Medical History:  Diagnosis Date   Bulimia nervosa    Dental caries    Diabetes mellitus without complication (HCC)    Genital warts    Noncompliance with diabetes treatment    Pelvic fracture [facility])     Patient Active Problem List   Diagnosis Date Noted   Back pain 07/26/2013   Headache(784.0) 06/22/2013   Depressive disorder, not elsewhere classified 06/22/2013   Dehydration 04/02/2013   Rt flank pain 04/01/2013   H/O medication noncompliance 04/01/2013   Left leg weakness 02/25/2013   Pustular rash 02/23/2013   Gingivitis 10/24/2012   DKA, type 1 (HCC) 10/22/2012   Hypotension 10/22/2012   Hypokalemia 10/22/2012    Past Surgical History:  Procedure Laterality Date   CESAREAN SECTION     CESAREAN SECTION  2012     OB History     Gravida  1   Para      Term      Preterm      AB  1   Living         SAB  1   IAB      Ectopic      Multiple      Live Births              Family History  Problem Relation Age of Onset   Diabetes Mother     Social History   Tobacco Use   Smoking status: Every Day    Types: Cigarettes   Smokeless  tobacco: Never  Vaping Use   Vaping Use: Never used  Substance Use Topics   Alcohol use: No   Drug use: Not Currently    Home Medications Prior to Admission medications   Medication Sig Start Date End Date Taking? Authorizing Provider  cephALEXin (KEFLEX) 500 MG capsule Take 1 capsule (500 mg total) by mouth 4 (four) times daily. 04/10/17   Benjiman Core, MD  clonazePAM (KLONOPIN) 0.5 MG tablet Take 0.5-1 mg by mouth 2 (two) times daily as needed. 08/31/20   [provider]  doxycycline (VIBRAMYCIN) 100 MG capsule Take 1 capsule (100 mg total) by mouth 2 (two) times daily. 03/30/15   Margarita Grizzle, MD  ergocalciferol (VITAMIN D2) 1.25 MG (50000 UT) capsule Take by mouth. 10/09/19   [provider]  fluconazole (DIFLUCAN) 150 MG tablet Take 1 tablet (150 mg total) by mouth once. Can take additional dose three days later if symptoms persist 02/04/15   Katrinka Blazing, [place], CNM  gabapentin (NEURONTIN) 300 MG capsule  05/21/20   [provider]  insulin aspart (NOVOLOG FLEXPEN) 100 UNIT/ML FlexPen Inject 12 Units into the skin 3 (three) times daily with meals. 04/10/17  Benjiman Core, MD  insulin detemir (LEVEMIR) 100 UNIT/ML injection Inject 35 Units into the skin daily.    [provider]  insulin glargine (LANTUS) 100 UNIT/ML injection Inject 55 Units into the skin at bedtime.    [provider]  Insulin Pen Needle (NOVOFINE) 30G X 8 MM MISC Inject 10 each into the skin as needed. 07/27/13   Russella Dar, NP  Insulin Pen Needle (NOVOFINE) 30G X 8 MM MISC Inject 10 each into the skin as needed. 07/27/13   Russella Dar, NP  oxyCODONE-acetaminophen (PERCOCET/ROXICET) 5-325 MG per tablet Take 1 tablet by mouth every 4 (four) hours as needed for severe pain. 03/30/15   Margarita Grizzle, MD  promethazine (PHENERGAN) 25 MG suppository Place 1 suppository (25 mg total) rectally every 6 (six) hours as needed for nausea or vomiting. 09/07/20   Cardama, Amadeo Garnet, MD  promethazine (PHENERGAN) 25 MG tablet Take 1 tablet (25 mg total) by mouth every 6 (six) hours as needed for nausea. 10/29/17   Geoffery Lyons, MD  traMADol Janean Sark) 50 MG tablet Take by mouth. 05/13/19   [provider]    Allergies    Patient has no known allergies.  Review of Systems   Review of Systems  Constitutional:  Negative for chills and fever.  HENT:  Negative for congestion.   Eyes:  Negative for visual disturbance.  Respiratory:  Positive for cough and shortness of breath.   Cardiovascular:  Positive for chest pain. Negative for palpitations and leg swelling.  Gastrointestinal:  Positive for nausea and vomiting. Negative for abdominal pain and diarrhea.  Genitourinary:  Negative for dysuria.  Skin:  Negative for rash.  Neurological:  Negative for headaches.  Psychiatric/Behavioral:  The patient is nervous/anxious.    Physical Exam Updated Vital Signs BP (!) 145/85 (BP Location: Left Arm)   Pulse 85   Temp 99 F (37.2 C) (Oral)   Resp (!) 24   Ht 5\' 11"  (1.803 m)   Wt 76.7 kg   LMP 04/29/2021   SpO2 98%   BMI 23.57 kg/m   Physical Exam Vitals and nursing note reviewed.  Constitutional:      Appearance: Normal appearance. She is not diaphoretic.     Comments: Tearful and anxious  HENT:     Head: Normocephalic.     Mouth/Throat:     Mouth: Mucous membranes are moist.  Cardiovascular:     Rate and Rhythm: Normal rate.  Pulmonary:     Effort: Pulmonary effort is normal. Tachypnea present. No accessory muscle usage or respiratory distress.     Breath sounds: No stridor. No decreased breath sounds.  Chest:     Chest wall: No tenderness.  Abdominal:     Palpations: Abdomen is soft.     Tenderness: There is no abdominal tenderness.  Musculoskeletal:     Right lower leg: No edema.     Left lower leg: No edema.  Skin:    General: Skin is warm.  Neurological:     Mental Status: She is alert and oriented to person, place, and time. Mental  status is at baseline.  Psychiatric:        Mood and Affect: Mood normal.    ED Results / Procedures / Treatments   Labs (all labs ordered are listed, but only abnormal results are displayed) Labs Reviewed  COMPREHENSIVE METABOLIC PANEL - Abnormal; Notable for the following components:      Result Value   Sodium 133 (*)  CO2 20 (*)    Glucose, Bld 308 (*)    All other components within normal limits  CBC WITH DIFFERENTIAL/PLATELET - Abnormal; Notable for the following components:   RBC 5.32 (*)    Hemoglobin 15.2 (*)    All other components within normal limits  URINALYSIS, ROUTINE W REFLEX MICROSCOPIC - Abnormal; Notable for the following components:   Glucose, UA >=500 (*)    Hgb urine dipstick SMALL (*)    Ketones, ur 80 (*)    Protein, ur 100 (*)    All other components within normal limits  URINALYSIS, MICROSCOPIC (REFLEX) - Abnormal; Notable for the following components:   Bacteria, UA RARE (*)    All other components within normal limits  CBG MONITORING, ED - Abnormal; Notable for the following components:   Glucose-Capillary 318 (*)    All other components within normal limits  I-STAT VENOUS BLOOD GAS, ED - Abnormal; Notable for the following components:   pH, Ven 7.456 (*)    pCO2, Ven 31.1 (*)    pO2, Ven 56.0 (*)    Sodium 131 (*)    Calcium, Ion 1.05 (*)    HCT 47.0 (*)    Hemoglobin 16.0 (*)    All other components within normal limits  PREGNANCY, URINE  BETA-HYDROXYBUTYRIC ACID  BLOOD GAS, VENOUS  TROPONIN I (HIGH SENSITIVITY)  TROPONIN I (HIGH SENSITIVITY)    EKG None  Radiology DG Chest Portable 1 View  Result Date: 05/18/2021 CLINICAL DATA:  Chest pain.  COVID positive 04/23/2021. EXAM: PORTABLE CHEST 1 VIEW COMPARISON:  None FINDINGS: The heart and mediastinal contours are within normal limits. No focal consolidation. No pulmonary edema. No pleural effusion. No pneumothorax. No acute osseous abnormality. IMPRESSION: No active disease.  Electronically Signed   By: Tish Frederickson M.D.   On: 05/18/2021 19:10    Procedures Procedures   Medications Ordered in ED Medications  promethazine (PHENERGAN) 12.5 mg in sodium chloride 0.9 % 50 mL IVPB (has no administration in time range)  sodium chloride 0.9 % bolus 1,000 mL (1,000 mLs Intravenous New Bag/Given 05/18/21 1827)  promethazine (PHENERGAN) 25 MG/ML injection (25 mg  Given 05/18/21 1828)  LORazepam (ATIVAN) injection 1 mg (1 mg Intravenous Given 05/18/21 2005)  alum & mag hydroxide-simeth (MAALOX/MYLANTA) 200-200-20 MG/5ML suspension 30 mL (30 mLs Oral Given 05/18/21 2005)    And  lidocaine (XYLOCAINE) 2 % viscous mouth solution 15 mL (15 mLs Oral Given 05/18/21 2005)    ED Course  I have reviewed the triage vital signs and the nursing notes.  Pertinent labs & imaging results that were available during my care of the patient were reviewed by me and considered in my medical decision making (see chart for details).    MDM Rules/Calculators/A&P                           30 year old female presents emergency department with midsternal chest discomfort.  She has known COVID-positive.  Had a work-up in another ER with negative blood work, chest x-ray and CT PE study.  States that she is having ongoing chest discomfort and nausea/vomiting.  EKG here is nonischemic.  Blood work is reassuring without any acute abnormalities.  Chest x-ray looks normal.  Recent negative CT PE study, no need to emergently repeat today.  Treated patient symptomatically for nausea/vomiting.  After multiple rounds of medications she feels improved, she is been able to tolerate p.o. without any difficulty.  Plan  for symptomatic outpatient treatment.  Patient at this time appears safe and stable for discharge and will be treated as an outpatient.  Discharge plan and strict return to ED precautions discussed, patient verbalizes understanding and agreement.   Final Clinical Impression(s) / ED  Diagnoses Final diagnoses:  None    Rx / DC Orders ED Discharge Orders     None        Rozelle LoganHorton, Levon Penning M, DO 05/18/21 2240

## 2021-05-18 NOTE — ED Triage Notes (Signed)
Pt c/o CP started 8/25-states she test +covid 8/4-states she was seen  2 days ago at [facility]" for same c/o-states she had blood work and CXR-no rx-NAD/anxious-steady gait

## 2021-05-18 NOTE — Discharge Instructions (Addendum)
You have been seen and discharged from the emergency department.  Your heart and lung work-up are normal.  Your blood work was normal.  Take new prescriptions as directed.  Follow-up with your primary provider for reevaluation and further care. Take home medications as prescribed. If you have any worsening symptoms or further concerns for your health please return to an emergency department for further evaluation.

## 2021-05-19 LAB — BETA-HYDROXYBUTYRIC ACID: Beta-Hydroxybutyric Acid: 3.37 mmol/L — ABNORMAL HIGH (ref 0.05–0.27)

## 2021-05-21 ENCOUNTER — Encounter (HOSPITAL_BASED_OUTPATIENT_CLINIC_OR_DEPARTMENT_OTHER): Payer: Self-pay | Admitting: *Deleted

## 2021-05-21 ENCOUNTER — Emergency Department (HOSPITAL_BASED_OUTPATIENT_CLINIC_OR_DEPARTMENT_OTHER)
Admission: EM | Admit: 2021-05-21 | Discharge: 2021-05-22 | Disposition: A | Discharge status: Home / Self Care | Payer: Medicaid Other | Attending: Emergency Medicine | Admitting: Emergency Medicine

## 2021-05-21 ENCOUNTER — Emergency Department (HOSPITAL_BASED_OUTPATIENT_CLINIC_OR_DEPARTMENT_OTHER): Payer: Medicaid Other

## 2021-05-21 ENCOUNTER — Other Ambulatory Visit: Payer: Self-pay

## 2021-05-21 DIAGNOSIS — F17210 Nicotine dependence, cigarettes, uncomplicated: Secondary | ICD-10-CM | POA: Insufficient documentation

## 2021-05-21 DIAGNOSIS — R000 Tachycardia, unspecified: Secondary | ICD-10-CM | POA: Insufficient documentation

## 2021-05-21 DIAGNOSIS — E860 Dehydration: Secondary | ICD-10-CM | POA: Insufficient documentation

## 2021-05-21 DIAGNOSIS — Z8616 Personal history of COVID-19: Secondary | ICD-10-CM | POA: Diagnosis not present

## 2021-05-21 DIAGNOSIS — Z794 Long term (current) use of insulin: Secondary | ICD-10-CM | POA: Diagnosis not present

## 2021-05-21 DIAGNOSIS — K2900 Acute gastritis without bleeding: Secondary | ICD-10-CM | POA: Diagnosis not present

## 2021-05-21 DIAGNOSIS — R739 Hyperglycemia, unspecified: Secondary | ICD-10-CM

## 2021-05-21 DIAGNOSIS — R079 Chest pain, unspecified: Secondary | ICD-10-CM | POA: Diagnosis present

## 2021-05-21 DIAGNOSIS — E1165 Type 2 diabetes mellitus with hyperglycemia: Secondary | ICD-10-CM | POA: Diagnosis not present

## 2021-05-21 LAB — COMPREHENSIVE METABOLIC PANEL
ALT: 14 U/L (ref 0–44)
AST: 17 U/L (ref 15–41)
Albumin: 3.6 g/dL (ref 3.5–5.0)
Alkaline Phosphatase: 99 U/L (ref 38–126)
Anion gap: 13 (ref 5–15)
BUN: 16 mg/dL (ref 6–20)
CO2: 20 mmol/L — ABNORMAL LOW (ref 22–32)
Calcium: 9 mg/dL (ref 8.9–10.3)
Chloride: 96 mmol/L — ABNORMAL LOW (ref 98–111)
Creatinine, Ser: 0.72 mg/dL (ref 0.44–1.00)
GFR, Estimated: >60 mL/min (ref >60)
Glucose, Bld: 666 mg/dL (ref 70–99)
Potassium: 4.2 mmol/L (ref 3.5–5.1)
Sodium: 129 mmol/L — ABNORMAL LOW (ref 135–145)
Total Bilirubin: 0.4 mg/dL (ref 0.3–1.2)
Total Protein: 7.2 g/dL (ref 6.5–8.1)

## 2021-05-21 LAB — URINALYSIS, ROUTINE W REFLEX MICROSCOPIC
Bilirubin Urine: NEGATIVE
Glucose, UA: >=500 mg/dL — AB
Hgb urine dipstick: NEGATIVE
Ketones, ur: 40 mg/dL — AB
Leukocytes,Ua: NEGATIVE
Nitrite: NEGATIVE
Protein, ur: NEGATIVE mg/dL
Specific Gravity, Urine: 1.015 (ref 1.005–1.030)
pH: 6 (ref 5.0–8.0)

## 2021-05-21 LAB — I-STAT VENOUS BLOOD GAS, ED
Acid-base deficit: 1 mmol/L (ref 0.0–2.0)
Bicarbonate: 24.1 mmol/L (ref 20.0–28.0)
Calcium, Ion: 1.21 mmol/L (ref 1.15–1.40)
HCT: 37 % (ref 36.0–46.0)
Hemoglobin: 12.6 g/dL (ref 12.0–15.0)
O2 Saturation: 92 %
Patient temperature: 98.3
Potassium: 4 mmol/L (ref 3.5–5.1)
Sodium: 134 mmol/L — ABNORMAL LOW (ref 135–145)
TCO2: 25 mmol/L (ref 22–32)
pCO2, Ven: 39.7 mmHg — ABNORMAL LOW (ref 44.0–60.0)
pH, Ven: 7.39 (ref 7.250–7.430)
pO2, Ven: 64 mmHg — ABNORMAL HIGH (ref 32.0–45.0)

## 2021-05-21 LAB — TROPONIN I (HIGH SENSITIVITY)
Troponin I (High Sensitivity): <2 ng/L (ref <18)
Troponin I (High Sensitivity): <2 ng/L (ref <18)

## 2021-05-21 LAB — CBG MONITORING, ED
Glucose-Capillary: >600 mg/dL (ref 70–99)
Glucose-Capillary: 304 mg/dL — ABNORMAL HIGH (ref 70–99)
Glucose-Capillary: 423 mg/dL — ABNORMAL HIGH (ref 70–99)

## 2021-05-21 LAB — URINALYSIS, MICROSCOPIC (REFLEX)

## 2021-05-21 LAB — PREGNANCY, URINE: Preg Test, Ur: NEGATIVE

## 2021-05-21 LAB — LIPASE, BLOOD: Lipase: 21 U/L (ref 11–51)

## 2021-05-21 LAB — CBC
HCT: 39.5 % (ref 36.0–46.0)
Hemoglobin: 13.5 g/dL (ref 12.0–15.0)
MCH: 29 pg (ref 26.0–34.0)
MCHC: 34.2 g/dL (ref 30.0–36.0)
MCV: 84.8 fL (ref 80.0–100.0)
Platelets: 216 10*3/uL (ref 150–400)
RBC: 4.66 MIL/uL (ref 3.87–5.11)
RDW: 13 % (ref 11.5–15.5)
WBC: 10.1 10*3/uL (ref 4.0–10.5)
nRBC: 0 % (ref 0.0–0.2)

## 2021-05-21 MED ORDER — SUCRALFATE 1 G PO TABS: 1.0000 g | ORAL_TABLET | Freq: Three times a day (TID) | ORAL | 0 refills | Status: DC

## 2021-05-21 MED ORDER — FAMOTIDINE IN NACL 20-0.9 MG/50ML-% IV SOLN
20.0000 mg | Freq: Once | INTRAVENOUS | Status: AC
  Administered 2021-05-21: 20 mg via INTRAVENOUS
  Filled 2021-05-21: qty 50

## 2021-05-21 MED ORDER — PROMETHAZINE HCL 25 MG/ML IJ SOLN
INTRAMUSCULAR | Status: AC
  Filled 2021-05-21: qty 1

## 2021-05-21 MED ORDER — FAMOTIDINE 20 MG PO TABS: 20.0000 mg | ORAL_TABLET | Freq: Two times a day (BID) | ORAL | 0 refills | Status: DC

## 2021-05-21 MED ORDER — SODIUM CHLORIDE 0.9 % IV SOLN
12.5000 mg | Freq: Four times a day (QID) | INTRAVENOUS | Status: DC | PRN
  Administered 2021-05-21: 12.5 mg via INTRAVENOUS
  Filled 2021-05-21: qty 0.5

## 2021-05-21 MED ORDER — SODIUM CHLORIDE 0.9 % IV SOLN
12.5000 mg | Freq: Once | INTRAVENOUS | Status: AC
  Administered 2021-05-21: 12.5 mg via INTRAVENOUS
  Filled 2021-05-21: qty 0.5

## 2021-05-21 MED ORDER — FENTANYL CITRATE PF 50 MCG/ML IJ SOSY
25.0000 ug | PREFILLED_SYRINGE | Freq: Once | INTRAMUSCULAR | Status: AC
  Administered 2021-05-21: 25 ug via INTRAVENOUS
  Filled 2021-05-21: qty 1

## 2021-05-21 MED ORDER — SODIUM CHLORIDE 0.9 % IV BOLUS
1000.0000 mL | Freq: Once | INTRAVENOUS | Status: AC
  Administered 2021-05-21: 1000 mL via INTRAVENOUS

## 2021-05-21 MED ORDER — MORPHINE SULFATE (PF) 4 MG/ML IV SOLN
4.0000 mg | Freq: Once | INTRAVENOUS | Status: AC
  Administered 2021-05-21: 4 mg via INTRAVENOUS
  Filled 2021-05-21: qty 1

## 2021-05-21 MED ORDER — ONDANSETRON HCL 4 MG/2ML IJ SOLN
4.0000 mg | Freq: Once | INTRAMUSCULAR | Status: AC
  Administered 2021-05-21: 4 mg via INTRAVENOUS
  Filled 2021-05-21: qty 2

## 2021-05-21 MED ORDER — INSULIN ASPART 100 UNIT/ML IJ SOLN
10.0000 [IU] | Freq: Once | INTRAMUSCULAR | Status: AC
  Administered 2021-05-21: 10 [IU] via INTRAVENOUS

## 2021-05-21 MED ORDER — KETOROLAC TROMETHAMINE 15 MG/ML IJ SOLN
15.0000 mg | Freq: Once | INTRAMUSCULAR | Status: AC
  Administered 2021-05-21: 15 mg via INTRAVENOUS
  Filled 2021-05-21: qty 1

## 2021-05-21 NOTE — ED Notes (Signed)
Pt states nausea continues, pt vomiting at this time.  States pain continues at 9/10.

## 2021-05-21 NOTE — ED Triage Notes (Signed)
Chest pain. She was seen 2 days ago for same. Hx of Covid a month ago.

## 2021-05-21 NOTE — ED Provider Notes (Signed)
[facility] EMERGENCY DEPARTMENT Provider Note   CSN: [id number] Arrival date & time: 05/21/21  1802     History Chief Complaint  Patient presents with   Chest Pain   Hyperglycemia    Julie [name] is a 30 y.o. female with a past medical history of IDDM, presenting to the ED with a chief complaint of continued chest pain and hyperglycemia.  She has had chest pressure for the past week and this is her third visit for the same.  She was seen and evaluated 3 days ago with reassuring work-up.  However is concerned because now the pressure has turned into a stabbing pain.  Her blood sugars have been running high for the past day.  She last to use NovoLog 4 hours ago 10 units.  Reports decreased appetite and continued cough, vomiting, diarrhea.  No sick contacts with similar symptoms.  Had COVID about 4 weeks ago and continues to use her albuterol inhaler as needed.  Denies any leg swelling, history of DVT, PE, MI, recent immobilization.  HPI     Past Medical History:  Diagnosis Date   Bulimia nervosa    Dental caries    Diabetes mellitus without complication (HCC)    Genital warts    Noncompliance with diabetes treatment    Pelvic fracture [facility](HCC)     Patient Active Problem List   Diagnosis Date Noted   Back pain 07/26/2013   Headache(784.0) 06/22/2013   Depressive disorder, not elsewhere classified 06/22/2013   Dehydration 04/02/2013   Rt flank pain 04/01/2013   H/O medication noncompliance 04/01/2013   Left leg weakness 02/25/2013   Pustular rash 02/23/2013   Gingivitis 10/24/2012   DKA, type 1 (HCC) 10/22/2012   Hypotension 10/22/2012   Hypokalemia 10/22/2012    Past Surgical History:  Procedure Laterality Date   CESAREAN SECTION     CESAREAN SECTION  2012     OB History     Gravida  1   Para      Term      Preterm      AB  1   Living         SAB  1   IAB      Ectopic      Multiple      Live Births              Family  History  Problem Relation Age of Onset   Diabetes Mother     Social History   Tobacco Use   Smoking status: Every Day    Types: Cigarettes   Smokeless tobacco: Never  Vaping Use   Vaping Use: Never used  Substance Use Topics   Alcohol use: No   Drug use: Not Currently    Home Medications Prior to Admission medications   Medication Sig Start Date End Date Taking? Authorizing Provider  alum & mag hydroxide-simeth (MAALOX MAX) 400-400-40 MG/5ML suspension Take 5 mLs by mouth as needed for up to 5 days for indigestion. 05/18/21 05/23/21  Horton, Clabe SealKristie M, DO  cephALEXin (KEFLEX) 500 MG capsule Take 1 capsule (500 mg total) by mouth 4 (four) times daily. 04/10/17   Benjiman CorePickering, Nathan, MD  clonazePAM (KLONOPIN) 0.5 MG tablet Take 0.5-1 mg by mouth 2 (two) times daily as needed. 08/31/20   [provider]  doxycycline (VIBRAMYCIN) 100 MG capsule Take 1 capsule (100 mg total) by mouth 2 (two) times daily. 03/30/15   Margarita Grizzleay, Danielle, MD  ergocalciferol (VITAMIN D2) 1.25 MG (  50000 UT) capsule Take by mouth. 10/09/19   [provider]  famotidine (PEPCID) 20 MG tablet Take 1 tablet (20 mg total) by mouth 2 (two) times daily. 05/21/21  Yes Denson Niccoli, PA-C  fluconazole (DIFLUCAN) 150 MG tablet Take 1 tablet (150 mg total) by mouth once. Can take additional dose three days later if symptoms persist 02/04/15   Katrinka Blazing, [place], CNM  gabapentin (NEURONTIN) 300 MG capsule  05/21/20   [provider]  insulin aspart (NOVOLOG FLEXPEN) 100 UNIT/ML FlexPen Inject 12 Units into the skin 3 (three) times daily with meals. 04/10/17   Benjiman Core, MD  insulin detemir (LEVEMIR) 100 UNIT/ML injection Inject 35 Units into the skin daily.    [provider]  insulin glargine (LANTUS) 100 UNIT/ML injection Inject 55 Units into the skin at bedtime.    [provider]  Insulin Pen Needle (NOVOFINE) 30G X 8 MM MISC Inject 10 each into the skin as needed. 07/27/13   Russella Dar, NP  Insulin Pen Needle (NOVOFINE) 30G X 8 MM MISC Inject 10 each into the skin as needed. 07/27/13   Russella Dar, NP  oxyCODONE-acetaminophen (PERCOCET/ROXICET) 5-325 MG per tablet Take 1 tablet by mouth every 4 (four) hours as needed for severe pain. 03/30/15   Margarita Grizzle, MD  promethazine-dextromethorphan (PROMETHAZINE-DM) 6.25-15 MG/5ML syrup Take 1.3 mLs by mouth 4 (four) times daily as needed for cough. 05/18/21   Horton, Clabe Seal, DO  sucralfate (CARAFATE) 1 g tablet Take 1 tablet (1 g total) by mouth 4 (four) times daily -  with meals and at bedtime. 05/21/21  Yes Jamie-Lee Galdamez, PA-C  traMADol (ULTRAM) 50 MG tablet Take by mouth. 05/13/19   [provider]    Allergies    Patient has no known allergies.  Review of Systems   Review of Systems  Constitutional:  Positive for fatigue and fever. Negative for appetite change and chills.  HENT:  Positive for sore throat. Negative for ear pain, rhinorrhea and sneezing.   Eyes:  Negative for photophobia and visual disturbance.  Respiratory:  Positive for cough. Negative for chest tightness, shortness of breath and wheezing.   Cardiovascular:  Positive for chest pain. Negative for palpitations.  Gastrointestinal:  Positive for diarrhea and vomiting. Negative for abdominal pain, blood in stool, constipation and nausea.  Genitourinary:  Negative for dysuria, hematuria and urgency.  Musculoskeletal:  Negative for myalgias.  Skin:  Negative for rash.  Neurological:  Negative for dizziness, weakness and light-headedness.   Physical Exam Updated Vital Signs BP 116/70   Pulse 64   Temp 98.3 F (36.8 C) (Oral)   Resp 18   Ht 5\' 11"  (1.803 m)   Wt 76.7 kg   LMP 04/29/2021   SpO2 98%   BMI 23.58 kg/m   Physical Exam Vitals and nursing note reviewed.  Constitutional:      General: She is not in acute distress.    Appearance: She is well-developed.  HENT:     Head: Normocephalic and atraumatic.     Nose: Nose normal.   Eyes:     General: No scleral icterus.       Left eye: No discharge.     Conjunctiva/sclera: Conjunctivae normal.  Cardiovascular:     Rate and Rhythm: Regular rhythm. Tachycardia present.     Heart sounds: Normal heart sounds. No murmur heard.   No friction rub. No gallop.  Pulmonary:     Effort: Pulmonary effort is normal. No respiratory  distress.     Breath sounds: Normal breath sounds.  Abdominal:     General: Bowel sounds are normal. There is no distension.     Palpations: Abdomen is soft.     Tenderness: There is no abdominal tenderness. There is no guarding.  Musculoskeletal:        General: Normal range of motion.     Cervical back: Normal range of motion and neck supple.  Skin:    General: Skin is warm and dry.     Findings: No rash.  Neurological:     Mental Status: She is alert.     Motor: No abnormal muscle tone.     Coordination: Coordination normal.    ED Results / Procedures / Treatments   Labs (all labs ordered are listed, but only abnormal results are displayed) Labs Reviewed  URINALYSIS, ROUTINE W REFLEX MICROSCOPIC - Abnormal; Notable for the following components:      Result Value   Color, Urine STRAW (*)    Glucose, UA >=500 (*)    Ketones, ur 40 (*)    All other components within normal limits  COMPREHENSIVE METABOLIC PANEL - Abnormal; Notable for the following components:   Sodium 129 (*)    Chloride 96 (*)    CO2 20 (*)    Glucose, Bld 666 (*)    All other components within normal limits  URINALYSIS, MICROSCOPIC (REFLEX) - Abnormal; Notable for the following components:   Bacteria, UA RARE (*)    All other components within normal limits  CBG MONITORING, ED - Abnormal; Notable for the following components:   Glucose-Capillary >600 (*)    All other components within normal limits  I-STAT VENOUS BLOOD GAS, ED - Abnormal; Notable for the following components:   pCO2, Ven 39.7 (*)    pO2, Ven 64.0 (*)    Sodium 134 (*)    All other components  within normal limits  CBG MONITORING, ED - Abnormal; Notable for the following components:   Glucose-Capillary 304 (*)    All other components within normal limits  CBG MONITORING, ED - Abnormal; Notable for the following components:   Glucose-Capillary 423 (*)    All other components within normal limits  CBC  PREGNANCY, URINE  LIPASE, BLOOD  BLOOD GAS, VENOUS  TROPONIN I (HIGH SENSITIVITY)  TROPONIN I (HIGH SENSITIVITY)    EKG EKG Interpretation  Date/Time:  Thursday May 21 2021 18:16:16 EDT Ventricular Rate:  106 PR Interval:  153 QRS Duration: 106 QT Interval:  326 QTC Calculation: 433 R Axis:   93 Text Interpretation: Sinus tachycardia Atrial premature complex Borderline right axis deviation Borderline repolarization abnormality Similar to Aug 2022 tracing Confirmed by Alona Bene [phone]) on 05/21/2021 6:19:56 PM  Radiology DG Abdomen Acute W/Chest  Result Date: 05/21/2021 CLINICAL DATA:  Chest pain EXAM: DG ABDOMEN ACUTE WITH 1 VIEW CHEST COMPARISON:  05/18/2021 FINDINGS: Single-view chest demonstrates no focal opacity or pleural effusion. Normal cardiomediastinal silhouette. Supine and upright views of the abdomen demonstrate no free air beneath the diaphragm. Nonobstructed gas pattern with moderate stool burden. IUD in the pelvis IMPRESSION: Negative abdominal radiographs.  No acute cardiopulmonary disease. Electronically Signed   By: Jasmine Pang M.D.   On: 05/21/2021 23:33    Procedures Procedures   Medications Ordered in ED Medications  promethazine (PHENERGAN) 12.5 mg in sodium chloride 0.9 % 50 mL IVPB (0 mg Intravenous Stopped 05/21/21 1937)  promethazine (PHENERGAN) 25 MG/ML injection (  Not Given 05/21/21 1845)  promethazine (  PHENERGAN) 25 MG/ML injection (  Not Given 05/21/21 2247)  morphine 4 MG/ML injection 4 mg (has no administration in time range)  sodium chloride 0.9 % bolus 1,000 mL (0 mLs Intravenous Stopped 05/21/21 1937)  fentaNYL (SUBLIMAZE) injection  25 mcg (25 mcg Intravenous Given 05/21/21 1834)  insulin aspart (novoLOG) injection 10 Units (10 Units Intravenous Given 05/21/21 1946)  sodium chloride 0.9 % bolus 1,000 mL (0 mLs Intravenous Stopped 05/21/21 2149)  morphine 4 MG/ML injection 4 mg (4 mg Intravenous Given 05/21/21 2007)  ondansetron (ZOFRAN) injection 4 mg (4 mg Intravenous Given 05/21/21 2006)  famotidine (PEPCID) IVPB 20 mg premix (20 mg Intravenous New Bag/Given 05/21/21 2150)  ketorolac (TORADOL) 15 MG/ML injection 15 mg (15 mg Intravenous Given 05/21/21 2148)  promethazine (PHENERGAN) 12.5 mg in sodium chloride 0.9 % 50 mL IVPB (0 mg Intravenous Stopped 05/21/21 2316)    ED Course  I have reviewed the triage vital signs and the nursing notes.  Pertinent labs & imaging results that were available during my care of the patient were reviewed by me and considered in my medical decision making (see chart for details).  Clinical Course as of 05/21/21 2338  Thu May 21, 2021  1841 Glucose-Capillary(!!): >600 [HK]  1841 Preg Test, Ur: NEGATIVE [HK]  1922 Glucose(!!): 666 [HK]  1922 CO2(!): 20 [HK]  2025 pH, Ven: 7.390 [HK]  2108 Glucose-Capillary(!): 304 [HK]  2116 Troponin I (High Sensitivity): <2 [HK]  2118 Patient appears improved.  Continuing to endorse nausea.  Posterior oropharynx without evidence of PTA, no tonsillar swelling, exudates or erythema. [HK]  2312 Anion gap: 13 [HK]    Clinical Course User Index [HK] Dietrich Pates, PA-C   MDM Rules/Calculators/A&P                           30 year old female with past medical history of IDDM presenting to the ED with a chief complaint of continued chest pain and hyperglycemia.  Reports continued chest pressure for the past week.  This is her third visit for the same.  Also concerned that her blood sugars have been running high despite using insulin.  Had COVID 4 weeks ago and has been using her albuterol inhaler as needed.  Reports vomiting and diarrhea.  She is concerned that she may  be dehydrated.  On exam abdomen is soft and nontender.  She initially tachycardic.  Blood sugars were over 600 on CBG machine.  Further work-up significant for normal pH on VBG, she has a bicarb of 20 and a normal anion gap.  She does have some ketonuria and glucosuria.  Normal troponin x2.  EKG shows sinus tachycardia, no changes from prior tracings.  She has no leukocytosis.  She was given IV fluids and insulin here with improvement. She was given several rounds of antiemetics, pain medication as well as Pepcid and Toradol.  X-ray shows no acute cardiac or intra-abdominal abnormalities.  Her CBG has improved.  Consider gastroparesis versus viral cause.  She had a negative CT angio of the chest in the past few days so I do not feel that this is necessary to repeat based on her improvement in tachycardia and symptoms.  She is able to tolerate p.o. intake without difficulty here.  We will have her continue symptomatic treatment at home and keep an eye on her blood sugars as well as follow-up with her primary care provider.  I doubt ACS, PE or acute intra-abdominal surgical cause  such as appendicitis, cholecystitis as a cause of her symptoms based on her reassuring work-up and physical exam findings. Return precautions given.  All imaging, if done today, including plain films, CT scans, and ultrasounds, independently reviewed by me, and interpretations confirmed via formal radiology reads.  Patient is hemodynamically stable, in NAD, and able to ambulate in the ED. Evaluation does not show pathology that would require ongoing emergent intervention or inpatient treatment. I explained the diagnosis to the patient. Pain has been managed and has no complaints prior to discharge. Patient is comfortable with above plan and is stable for discharge at this time. All questions were answered prior to disposition. Strict return precautions for returning to the ED were discussed. Encouraged follow up with PCP.   An After  Visit Summary was printed and given to the patient.   Portions of this note were generated with Scientist, clinical (histocompatibility and immunogenetics). Dictation errors may occur despite best attempts at proofreading.  Final Clinical Impression(s) / ED Diagnoses Final diagnoses:  Acute gastritis without hemorrhage, unspecified gastritis type  Hyperglycemia  Dehydration    Rx / DC Orders ED Discharge Orders          Ordered    famotidine (PEPCID) 20 MG tablet  2 times daily        05/21/21 2336    sucralfate (CARAFATE) 1 g tablet  3 times daily with meals & bedtime        05/21/21 2336             Dietrich Pates, PA-C 05/21/21 2338    Maia Plan, MD 05/26/21 1733

## 2021-05-21 NOTE — ED Notes (Signed)
Patient given water for PO challenge. Patient complaining of pain when swallowing. States feels pain down esophagus and then radiating to chest. States feels like something is stuck. Provider notified.

## 2021-05-21 NOTE — Discharge Instructions (Addendum)
Your work-up today was reassuring. Your blood sugars were high but we are able to improve this with insulin and fluids. Make sure you are drinking plenty of fluids and taking the medications as needed to help with your symptoms. Follow-up with your primary care provider or the 1 listed below. Make sure you keep an eye on your blood sugars and adjust her insulin based on how high it is. Return to the ER if you start to experience worsening pain, develop a fever, shortness of breath, leg swelling or bloody stools

## 2021-05-28 ENCOUNTER — Encounter (HOSPITAL_BASED_OUTPATIENT_CLINIC_OR_DEPARTMENT_OTHER): Payer: Self-pay

## 2021-05-28 ENCOUNTER — Other Ambulatory Visit: Payer: Self-pay

## 2021-05-28 ENCOUNTER — Emergency Department (HOSPITAL_BASED_OUTPATIENT_CLINIC_OR_DEPARTMENT_OTHER): Payer: Medicaid Other

## 2021-05-28 ENCOUNTER — Inpatient Hospital Stay (HOSPITAL_BASED_OUTPATIENT_CLINIC_OR_DEPARTMENT_OTHER)
Admission: EM | Admit: 2021-05-28 | Discharge: 2021-06-05 | DRG: 638 | Disposition: A | Discharge status: Home Health | Payer: Medicaid Other | Attending: Internal Medicine | Admitting: Internal Medicine

## 2021-05-28 DIAGNOSIS — E871 Hypo-osmolality and hyponatremia: Secondary | ICD-10-CM | POA: Diagnosis present

## 2021-05-28 DIAGNOSIS — M5416 Radiculopathy, lumbar region: Secondary | ICD-10-CM | POA: Diagnosis present

## 2021-05-28 DIAGNOSIS — E1043 Type 1 diabetes mellitus with diabetic autonomic (poly)neuropathy: Secondary | ICD-10-CM | POA: Diagnosis present

## 2021-05-28 DIAGNOSIS — M25552 Pain in left hip: Secondary | ICD-10-CM | POA: Diagnosis not present

## 2021-05-28 DIAGNOSIS — K3184 Gastroparesis: Secondary | ICD-10-CM | POA: Diagnosis present

## 2021-05-28 DIAGNOSIS — Y92239 Unspecified place in hospital as the place of occurrence of the external cause: Secondary | ICD-10-CM | POA: Diagnosis not present

## 2021-05-28 DIAGNOSIS — W19XXXA Unspecified fall, initial encounter: Secondary | ICD-10-CM

## 2021-05-28 DIAGNOSIS — S0990XA Unspecified injury of head, initial encounter: Secondary | ICD-10-CM | POA: Diagnosis not present

## 2021-05-28 DIAGNOSIS — F418 Other specified anxiety disorders: Secondary | ICD-10-CM | POA: Diagnosis not present

## 2021-05-28 DIAGNOSIS — S025XXA Fracture of tooth (traumatic), initial encounter for closed fracture: Secondary | ICD-10-CM | POA: Diagnosis present

## 2021-05-28 DIAGNOSIS — E875 Hyperkalemia: Secondary | ICD-10-CM | POA: Diagnosis not present

## 2021-05-28 DIAGNOSIS — M25512 Pain in left shoulder: Secondary | ICD-10-CM | POA: Diagnosis not present

## 2021-05-28 DIAGNOSIS — W010XXA Fall on same level from slipping, tripping and stumbling without subsequent striking against object, initial encounter: Secondary | ICD-10-CM | POA: Diagnosis not present

## 2021-05-28 DIAGNOSIS — E876 Hypokalemia: Secondary | ICD-10-CM | POA: Diagnosis not present

## 2021-05-28 DIAGNOSIS — E1010 Type 1 diabetes mellitus with ketoacidosis without coma: Secondary | ICD-10-CM | POA: Diagnosis not present

## 2021-05-28 DIAGNOSIS — Z833 Family history of diabetes mellitus: Secondary | ICD-10-CM

## 2021-05-28 DIAGNOSIS — M48061 Spinal stenosis, lumbar region without neurogenic claudication: Secondary | ICD-10-CM | POA: Diagnosis present

## 2021-05-28 DIAGNOSIS — G8929 Other chronic pain: Secondary | ICD-10-CM | POA: Diagnosis present

## 2021-05-28 DIAGNOSIS — R0789 Other chest pain: Secondary | ICD-10-CM

## 2021-05-28 DIAGNOSIS — Z79899 Other long term (current) drug therapy: Secondary | ICD-10-CM

## 2021-05-28 DIAGNOSIS — R1013 Epigastric pain: Secondary | ICD-10-CM

## 2021-05-28 DIAGNOSIS — F17210 Nicotine dependence, cigarettes, uncomplicated: Secondary | ICD-10-CM | POA: Diagnosis present

## 2021-05-28 DIAGNOSIS — G4700 Insomnia, unspecified: Secondary | ICD-10-CM | POA: Diagnosis present

## 2021-05-28 DIAGNOSIS — M545 Low back pain, unspecified: Secondary | ICD-10-CM | POA: Diagnosis present

## 2021-05-28 DIAGNOSIS — Z79891 Long term (current) use of opiate analgesic: Secondary | ICD-10-CM

## 2021-05-28 DIAGNOSIS — F32A Depression, unspecified: Secondary | ICD-10-CM | POA: Diagnosis present

## 2021-05-28 DIAGNOSIS — Z9119 Patient's noncompliance with other medical treatment and regimen: Secondary | ICD-10-CM

## 2021-05-28 DIAGNOSIS — D751 Secondary polycythemia: Secondary | ICD-10-CM | POA: Diagnosis present

## 2021-05-28 DIAGNOSIS — R079 Chest pain, unspecified: Secondary | ICD-10-CM | POA: Diagnosis present

## 2021-05-28 DIAGNOSIS — Z8616 Personal history of COVID-19: Secondary | ICD-10-CM

## 2021-05-28 DIAGNOSIS — Z20822 Contact with and (suspected) exposure to covid-19: Secondary | ICD-10-CM | POA: Diagnosis present

## 2021-05-28 DIAGNOSIS — F410 Panic disorder [episodic paroxysmal anxiety] without agoraphobia: Secondary | ICD-10-CM | POA: Diagnosis present

## 2021-05-28 DIAGNOSIS — Z794 Long term (current) use of insulin: Secondary | ICD-10-CM

## 2021-05-28 DIAGNOSIS — R1310 Dysphagia, unspecified: Secondary | ICD-10-CM

## 2021-05-28 DIAGNOSIS — Z765 Malingerer [conscious simulation]: Secondary | ICD-10-CM

## 2021-05-28 LAB — CBC WITH DIFFERENTIAL/PLATELET
Abs Immature Granulocytes: 0.05 10*3/uL (ref 0.00–0.07)
Basophils Absolute: 0.1 10*3/uL (ref 0.0–0.1)
Basophils Relative: 1 %
Eosinophils Absolute: 0.1 10*3/uL (ref 0.0–0.5)
Eosinophils Relative: 1 %
HCT: 47.2 % — ABNORMAL HIGH (ref 36.0–46.0)
Hemoglobin: 15.8 g/dL — ABNORMAL HIGH (ref 12.0–15.0)
Immature Granulocytes: 1 %
Lymphocytes Relative: 29 %
Lymphs Abs: 2.8 10*3/uL (ref 0.7–4.0)
MCH: 28.7 pg (ref 26.0–34.0)
MCHC: 33.5 g/dL (ref 30.0–36.0)
MCV: 85.7 fL (ref 80.0–100.0)
Monocytes Absolute: 0.5 10*3/uL (ref 0.1–1.0)
Monocytes Relative: 6 %
Neutro Abs: 6.1 10*3/uL (ref 1.7–7.7)
Neutrophils Relative %: 62 %
Platelets: 277 10*3/uL (ref 150–400)
RBC: 5.51 MIL/uL — ABNORMAL HIGH (ref 3.87–5.11)
RDW: 12.9 % (ref 11.5–15.5)
WBC: 9.7 10*3/uL (ref 4.0–10.5)
nRBC: 0 % (ref 0.0–0.2)

## 2021-05-28 LAB — BASIC METABOLIC PANEL
Anion gap: 17 — ABNORMAL HIGH (ref 5–15)
BUN: 14 mg/dL (ref 6–20)
CO2: 15 mmol/L — ABNORMAL LOW (ref 22–32)
Calcium: 8.7 mg/dL — ABNORMAL LOW (ref 8.9–10.3)
Chloride: 92 mmol/L — ABNORMAL LOW (ref 98–111)
Creatinine, Ser: 0.8 mg/dL (ref 0.44–1.00)
GFR, Estimated: >60 mL/min (ref >60)
Glucose, Bld: 619 mg/dL (ref 70–99)
Potassium: 4.7 mmol/L (ref 3.5–5.1)
Sodium: 124 mmol/L — ABNORMAL LOW (ref 135–145)

## 2021-05-28 LAB — URINALYSIS, ROUTINE W REFLEX MICROSCOPIC
Bilirubin Urine: NEGATIVE
Glucose, UA: >=500 mg/dL — AB
Hgb urine dipstick: NEGATIVE
Ketones, ur: 80 mg/dL — AB
Leukocytes,Ua: NEGATIVE
Nitrite: NEGATIVE
Protein, ur: NEGATIVE mg/dL
Specific Gravity, Urine: 1.015 (ref 1.005–1.030)
pH: 6 (ref 5.0–8.0)

## 2021-05-28 LAB — RESP PANEL BY RT-PCR (FLU A&B, COVID) ARPGX2
Influenza A by PCR: NEGATIVE
Influenza B by PCR: NEGATIVE
SARS Coronavirus 2 by RT PCR: NEGATIVE

## 2021-05-28 LAB — I-STAT VENOUS BLOOD GAS, ED
Acid-base deficit: 10 mmol/L — ABNORMAL HIGH (ref 0.0–2.0)
Bicarbonate: 13.6 mmol/L — ABNORMAL LOW (ref 20.0–28.0)
Calcium, Ion: 1.06 mmol/L — ABNORMAL LOW (ref 1.15–1.40)
HCT: 49 % — ABNORMAL HIGH (ref 36.0–46.0)
Hemoglobin: 16.7 g/dL — ABNORMAL HIGH (ref 12.0–15.0)
O2 Saturation: 98 %
Patient temperature: 99.4
Potassium: 4.3 mmol/L (ref 3.5–5.1)
Sodium: 122 mmol/L — ABNORMAL LOW (ref 135–145)
TCO2: 14 mmol/L — ABNORMAL LOW (ref 22–32)
pCO2, Ven: 24.6 mmHg — ABNORMAL LOW (ref 44.0–60.0)
pH, Ven: 7.353 (ref 7.250–7.430)
pO2, Ven: 111 mmHg — ABNORMAL HIGH (ref 32.0–45.0)

## 2021-05-28 LAB — URINALYSIS, MICROSCOPIC (REFLEX): WBC, UA: NONE SEEN WBC/hpf (ref 0–5)

## 2021-05-28 LAB — CBG MONITORING, ED
Glucose-Capillary: >600 mg/dL (ref 70–99)
Glucose-Capillary: 520 mg/dL (ref 70–99)
Glucose-Capillary: 369 mg/dL — ABNORMAL HIGH (ref 70–99)
Glucose-Capillary: 410 mg/dL — ABNORMAL HIGH (ref 70–99)
Glucose-Capillary: 222 mg/dL — ABNORMAL HIGH (ref 70–99)

## 2021-05-28 LAB — TROPONIN I (HIGH SENSITIVITY)
Troponin I (High Sensitivity): <2 ng/L (ref <18)
Troponin I (High Sensitivity): 2 ng/L (ref <18)

## 2021-05-28 LAB — PREGNANCY, URINE: Preg Test, Ur: NEGATIVE

## 2021-05-28 LAB — LIPASE, BLOOD: Lipase: 19 U/L (ref 11–51)

## 2021-05-28 MED ORDER — LACTATED RINGERS IV SOLN: INTRAVENOUS | Status: DC

## 2021-05-28 MED ORDER — IOHEXOL 350 MG/ML SOLN
100.0000 mL | Freq: Once | INTRAVENOUS | Status: AC | PRN
  Administered 2021-05-28: 100 mL via INTRAVENOUS

## 2021-05-28 MED ORDER — INSULIN REGULAR(HUMAN) IN NACL 100-0.9 UT/100ML-% IV SOLN
INTRAVENOUS | Status: DC
  Administered 2021-05-28: 11 [IU]/h via INTRAVENOUS
  Filled 2021-05-28: qty 100

## 2021-05-28 MED ORDER — PROMETHAZINE HCL 25 MG/ML IJ SOLN
INTRAMUSCULAR | Status: AC
  Filled 2021-05-28: qty 1

## 2021-05-28 MED ORDER — DEXTROSE IN LACTATED RINGERS 5 % IV SOLN: INTRAVENOUS | Status: DC

## 2021-05-28 MED ORDER — PROMETHAZINE HCL 25 MG PO TABS: 25.0000 mg | ORAL_TABLET | Freq: Once | ORAL | Status: DC

## 2021-05-28 MED ORDER — DEXTROSE 50 % IV SOLN: 0.0000 mL | INTRAVENOUS | Status: DC | PRN

## 2021-05-28 MED ORDER — LACTATED RINGERS IV BOLUS
20.0000 mL/kg | Freq: Once | INTRAVENOUS | Status: AC
  Administered 2021-05-28: 1496 mL via INTRAVENOUS

## 2021-05-28 MED ORDER — HYDROMORPHONE HCL 1 MG/ML IJ SOLN
1.0000 mg | INTRAMUSCULAR | Status: AC | PRN
  Administered 2021-05-28 – 2021-05-29 (×3): 1 mg via INTRAVENOUS
  Filled 2021-05-28 (×4): qty 1

## 2021-05-28 MED ORDER — POTASSIUM CHLORIDE 10 MEQ/100ML IV SOLN
10.0000 meq | INTRAVENOUS | Status: AC
  Administered 2021-05-28 (×2): 10 meq via INTRAVENOUS
  Filled 2021-05-28 (×2): qty 100

## 2021-05-28 MED ORDER — SODIUM CHLORIDE 0.9 % IV SOLN
12.5000 mg | Freq: Once | INTRAVENOUS | Status: AC
  Administered 2021-05-28: 12.5 mg via INTRAVENOUS
  Filled 2021-05-28: qty 0.5

## 2021-05-28 MED ORDER — HYDROMORPHONE HCL 1 MG/ML IJ SOLN
1.0000 mg | Freq: Once | INTRAMUSCULAR | Status: AC
  Administered 2021-05-28: 1 mg via INTRAVENOUS
  Filled 2021-05-28: qty 1

## 2021-05-28 MED ORDER — ONDANSETRON HCL 4 MG/2ML IJ SOLN
4.0000 mg | Freq: Once | INTRAMUSCULAR | Status: DC
  Filled 2021-05-28: qty 2

## 2021-05-28 NOTE — ED Provider Notes (Signed)
[facility] EMERGENCY DEPARTMENT Provider Note   CSN: [id number] Arrival date & time: 05/28/21  1844     History Chief Complaint  Patient presents with   Hyperglycemia   Chest Pain    Julie AlimentValeria [name] is a 30 y.o. female.   Hyperglycemia Associated symptoms: chest pain, dysuria, nausea, shortness of breath and vomiting   Associated symptoms: no abdominal pain and no fever    30 year old female with a history of type 1 diabetes presenting to the emergency department with concern for uncontrolled hyperglycemia.  The patient states that she previously had an insulin pump which broke 3 months ago.  She has been managing her diabetes with insulin injections at home.  For the past few weeks her home glucometer has been reading high.  She has been endorsing worsening shortness of breath over the past few days, and has had nausea and vomiting over the past 4 days.  She denies any fevers but does endorse chills. Additionally, she endorses substernal chest pain radiating to her back.  She also has epigastric abdominal pain with associated nausea and vomiting.  She endorses dysuria and increased urinary frequency.  Past Medical History:  Diagnosis Date   Bulimia nervosa    Dental caries    Diabetes mellitus without complication (HCC)    Genital warts    Noncompliance with diabetes treatment    Pelvic fracture [facility](HCC)     Patient Active Problem List   Diagnosis Date Noted   Back pain 07/26/2013   Headache(784.0) 06/22/2013   Depressive disorder, not elsewhere classified 06/22/2013   Dehydration 04/02/2013   Rt flank pain 04/01/2013   H/O medication noncompliance 04/01/2013   Left leg weakness 02/25/2013   Pustular rash 02/23/2013   Gingivitis 10/24/2012   DKA, type 1 (HCC) 10/22/2012   Hypotension 10/22/2012   Hypokalemia 10/22/2012    Past Surgical History:  Procedure Laterality Date   CESAREAN SECTION     CESAREAN SECTION  2012     OB History     Gravida   1   Para      Term      Preterm      AB  1   Living         SAB  1   IAB      Ectopic      Multiple      Live Births              Family History  Problem Relation Age of Onset   Diabetes Mother     Social History   Tobacco Use   Smoking status: Every Day    Types: Cigarettes   Smokeless tobacco: Never  Vaping Use   Vaping Use: Never used  Substance Use Topics   Alcohol use: No   Drug use: Not Currently    Home Medications Prior to Admission medications   Medication Sig Start Date End Date Taking? Authorizing Provider  cephALEXin (KEFLEX) 500 MG capsule Take 1 capsule (500 mg total) by mouth 4 (four) times daily. 04/10/17   Benjiman CorePickering, Nathan, MD  clonazePAM (KLONOPIN) 0.5 MG tablet Take 0.5-1 mg by mouth 2 (two) times daily as needed. 08/31/20   [provider]  doxycycline (VIBRAMYCIN) 100 MG capsule Take 1 capsule (100 mg total) by mouth 2 (two) times daily. 03/30/15   Margarita Grizzleay, Danielle, MD  ergocalciferol (VITAMIN D2) 1.25 MG (50000 UT) capsule Take by mouth. 10/09/19   [provider]  famotidine (PEPCID) 20 MG tablet  Take 1 tablet (20 mg total) by mouth 2 (two) times daily. 05/21/21   Khatri, Hina, PA-C  fluconazole (DIFLUCAN) 150 MG tablet Take 1 tablet (150 mg total) by mouth once. Can take additional dose three days later if symptoms persist 02/04/15   Katrinka Blazing, [place], CNM  gabapentin (NEURONTIN) 300 MG capsule  05/21/20   [provider]  insulin aspart (NOVOLOG FLEXPEN) 100 UNIT/ML FlexPen Inject 12 Units into the skin 3 (three) times daily with meals. 04/10/17   Benjiman Core, MD  insulin detemir (LEVEMIR) 100 UNIT/ML injection Inject 35 Units into the skin daily.    [provider]  insulin glargine (LANTUS) 100 UNIT/ML injection Inject 55 Units into the skin at bedtime.    [provider]  Insulin Pen Needle (NOVOFINE) 30G X 8 MM MISC Inject 10 each into the skin as needed. 07/27/13   Russella Dar, NP   Insulin Pen Needle (NOVOFINE) 30G X 8 MM MISC Inject 10 each into the skin as needed. 07/27/13   Russella Dar, NP  oxyCODONE-acetaminophen (PERCOCET/ROXICET) 5-325 MG per tablet Take 1 tablet by mouth every 4 (four) hours as needed for severe pain. 03/30/15   Margarita Grizzle, MD  promethazine-dextromethorphan (PROMETHAZINE-DM) 6.25-15 MG/5ML syrup Take 1.3 mLs by mouth 4 (four) times daily as needed for cough. 05/18/21   Horton, Clabe Seal, DO  sucralfate (CARAFATE) 1 g tablet Take 1 tablet (1 g total) by mouth 4 (four) times daily -  with meals and at bedtime. 05/21/21   Dietrich Pates, PA-C  traMADol (ULTRAM) 50 MG tablet Take by mouth. 05/13/19   [provider]    Allergies    Patient has no known allergies.  Review of Systems   Review of Systems  Constitutional:  Positive for chills. Negative for fever.  HENT:  Negative for ear pain and sore throat.   Eyes:  Negative for pain and visual disturbance.  Respiratory:  Positive for cough, chest tightness and shortness of breath.   Cardiovascular:  Positive for chest pain. Negative for palpitations.  Gastrointestinal:  Positive for nausea and vomiting. Negative for abdominal pain.  Genitourinary:  Positive for dysuria and frequency. Negative for hematuria.  Musculoskeletal:  Negative for arthralgias and back pain.  Skin:  Negative for color change and rash.  Neurological:  Negative for seizures and syncope.  All other systems reviewed and are negative.  Physical Exam Updated Vital Signs BP 115/81   Pulse 94   Temp 99.1 F (37.3 C) (Oral)   Resp 15   Ht 5\' 11"  (1.803 m)   Wt 74.8 kg   LMP 04/29/2021   SpO2 98%   BMI 23.01 kg/m   Physical Exam Vitals and nursing note reviewed.  Constitutional:      General: She is not in acute distress.    Appearance: She is well-developed. She is ill-appearing.  HENT:     Head: Normocephalic and atraumatic.  Eyes:     Conjunctiva/sclera: Conjunctivae normal.  Cardiovascular:     Rate  and Rhythm: Regular rhythm. Tachycardia present.     Heart sounds: No murmur heard. Pulmonary:     Effort: Pulmonary effort is normal. No respiratory distress.     Breath sounds: Normal breath sounds.  Chest:     Comments: Chest wall tenderness to palpation along the sternum Abdominal:     Palpations: Abdomen is soft.     Tenderness: There is no abdominal tenderness.  Musculoskeletal:     Cervical back: Neck supple.  Skin:    General: Skin is warm and dry.     Capillary Refill: Capillary refill takes 2 to 3 seconds.  Neurological:     General: No focal deficit present.     Mental Status: She is alert and oriented to person, place, and time.    ED Results / Procedures / Treatments   Labs (all labs ordered are listed, but only abnormal results are displayed) Labs Reviewed  URINALYSIS, ROUTINE W REFLEX MICROSCOPIC - Abnormal; Notable for the following components:      Result Value   Color, Urine STRAW (*)    Glucose, UA >=500 (*)    Ketones, ur 80 (*)    All other components within normal limits  BASIC METABOLIC PANEL - Abnormal; Notable for the following components:   Sodium 124 (*)    Chloride 92 (*)    CO2 15 (*)    Glucose, Bld 619 (*)    Calcium 8.7 (*)    Anion gap 17 (*)    All other components within normal limits  CBC WITH DIFFERENTIAL/PLATELET - Abnormal; Notable for the following components:   RBC 5.51 (*)    Hemoglobin 15.8 (*)    HCT 47.2 (*)    All other components within normal limits  URINALYSIS, MICROSCOPIC (REFLEX) - Abnormal; Notable for the following components:   Bacteria, UA RARE (*)    All other components within normal limits  CBG MONITORING, ED - Abnormal; Notable for the following components:   Glucose-Capillary >600 (*)    All other components within normal limits  CBG MONITORING, ED - Abnormal; Notable for the following components:   Glucose-Capillary 520 (*)    All other components within normal limits  I-STAT VENOUS BLOOD GAS, ED -  Abnormal; Notable for the following components:   pCO2, Ven 24.6 (*)    pO2, Ven 111.0 (*)    Bicarbonate 13.6 (*)    TCO2 14 (*)    Acid-base deficit 10.0 (*)    Sodium 122 (*)    Calcium, Ion 1.06 (*)    HCT 49.0 (*)    Hemoglobin 16.7 (*)    All other components within normal limits  CBG MONITORING, ED - Abnormal; Notable for the following components:   Glucose-Capillary 410 (*)    All other components within normal limits  CBG MONITORING, ED - Abnormal; Notable for the following components:   Glucose-Capillary 369 (*)    All other components within normal limits  CBG MONITORING, ED - Abnormal; Notable for the following components:   Glucose-Capillary 222 (*)    All other components within normal limits  RESP PANEL BY RT-PCR (FLU A&B, COVID) ARPGX2  PREGNANCY, URINE  LIPASE, BLOOD  BASIC METABOLIC PANEL  BASIC METABOLIC PANEL  BETA-HYDROXYBUTYRIC ACID  BETA-HYDROXYBUTYRIC ACID  BASIC METABOLIC PANEL  BETA-HYDROXYBUTYRIC ACID  TROPONIN I (HIGH SENSITIVITY)  TROPONIN I (HIGH SENSITIVITY)    EKG EKG Interpretation  Date/Time:  Thursday May 28 2021 18:55:42 EDT Ventricular Rate:  138 PR Interval:  134 QRS Duration: 103 QT Interval:  307 QTC Calculation: 466 R Axis:   100 Text Interpretation: Sinus tachycardia Borderline right axis deviation Borderline repolarization abnormality Confirmed by Ernie Avena ([id number]) on 05/28/2021 8:05:16 PM  Radiology CT Angio Chest PE W and/or Wo Contrast  Result Date: 05/28/2021 CLINICAL DATA:  Chest pain hyperglycemia EXAM: CT ANGIOGRAPHY CHEST WITH CONTRAST TECHNIQUE: Multidetector CT imaging of the chest was performed using the standard protocol during bolus administration of intravenous contrast. Multiplanar CT  image reconstructions and MIPs were obtained to evaluate the vascular anatomy. CONTRAST:  OMNIPAQUE IOHEXOL 350 MG/ML SOLN COMPARISON:  Chest x-ray 05/18/2021 FINDINGS: Cardiovascular: Satisfactory opacification of the  pulmonary arteries to the segmental level. No evidence of pulmonary embolism. Normal heart size. No pericardial effusion. Nonaneurysmal aorta. No dissection is seen. Mediastinum/Nodes: No enlarged mediastinal, hilar, or axillary lymph nodes. Thyroid gland, trachea, and esophagus demonstrate no significant findings. Lungs/Pleura: Lungs are clear. No pleural effusion or pneumothorax. Upper Abdomen: No acute abnormality. Musculoskeletal: No chest wall abnormality. No acute or significant osseous findings. Review of the MIP images confirms the above findings. IMPRESSION: Negative. No CT evidence for acute pulmonary embolus or aortic dissection. Clear lung fields Electronically Signed   By: Jasmine Pang M.D.   On: 05/28/2021 21:20    Procedures .Critical Care Performed by: Ernie Avena, MD Authorized by: Ernie Avena, MD   Critical care provider statement:    Critical care time (minutes):  44   Critical care was necessary to treat or prevent imminent or life-threatening deterioration of the following conditions:  Endocrine crisis   Critical care was time spent personally by me on the following activities:  Discussions with consultants, evaluation of patient's response to treatment, examination of patient, ordering and performing treatments and interventions, ordering and review of laboratory studies, ordering and review of radiographic studies, pulse oximetry, re-evaluation of patient's condition, obtaining history from patient or surrogate and review of old charts   Medications Ordered in ED Medications  insulin regular, human (MYXREDLIN) 100 units/ 100 mL infusion (3.4 Units/hr Intravenous Rate/Dose Change 05/28/21 2254)  lactated ringers infusion ( Intravenous New Bag/Given 05/28/21 2141)  dextrose 5 % in lactated ringers infusion ( Intravenous New Bag/Given 05/28/21 2301)  dextrose 50 % solution 0-50 mL (has no administration in time range)  promethazine (PHENERGAN) 25 MG/ML injection (has no  administration in time range)  HYDROmorphone (DILAUDID) injection 1 mg (1 mg Intravenous Given 05/28/21 2244)  lactated ringers bolus 1,496 mL ( Intravenous Stopped 05/28/21 2030)  potassium chloride 10 mEq in 100 mL IVPB (0 mEq Intravenous Stopped 05/28/21 2250)  HYDROmorphone (DILAUDID) injection 1 mg (1 mg Intravenous Given 05/28/21 2001)  promethazine (PHENERGAN) 12.5 mg in sodium chloride 0.9 % 50 mL IVPB (0 mg Intravenous Stopped 05/28/21 2318)  iohexol (OMNIPAQUE) 350 MG/ML injection 100 mL (100 mLs Intravenous Contrast Given 05/28/21 2059)    ED Course  I have reviewed the triage vital signs and the nursing notes.  Pertinent labs & imaging results that were available during my care of the patient were reviewed by me and considered in my medical decision making (see chart for details).    MDM Rules/Calculators/A&P                           30 year old female with a history of type 1 diabetes presenting to the emergency department with concern for uncontrolled hyperglycemia.  The patient states that she previously had an insulin pump which broke 3 months ago.  She has been managing her diabetes with insulin injections at home.  For the past few weeks her home glucometer has been reading high.  She has been endorsing worsening shortness of breath over the past few days, and has had nausea and vomiting over the past 4 days.  She denies any fevers but does endorse chills. Additionally, she endorses substernal chest pain radiating to her back.  She also has epigastric abdominal pain with associated nausea and  vomiting.  She endorses dysuria and increased urinary frequency.  On arrival, the patient was afebrile, tachycardic P1 39, mildly tachypneic, hemodynamically stable BP 143/110, saturating 100% on room air.  She presented with concern for DKA with hyperglycemia and elevated blood glucose readings on her home glucometers.  Bilateral IV access was obtained and the patient was administered IV fluid  boluses.  Initial blood glucose reading greater than 600.  Concern for DKA versus HHS.  A BMP was significant for pseudohyponatremia in the setting of hyperglycemia to 124, hyperglycemia to 619, and anion gap metabolic acidosis with a bicarb of 15 and an anion gap of 17.  Patient's urine pregnancy was negative.  Her urinalysis was consistent with hyperglycemia and ketosis.  Beta hydroxybutyrate was collected and pending.  A venous blood gas revealed a pH of 7.35.  Given the anion gap metabolic acidosis in the setting of hyperglycemia, will treat the patient for DKA with an insulin drip.  Additional labs concerning for a normal lipase, COVID-19 and influenza PCR that was negative, urinalysis without evidence of UTI, troponins x2 negative.  CBC without a leukocytosis, anemia or platelet abnormality.  Given the patient's chest discomfort, nausea and vomiting, will obtain CT angio of the chest.  No evidence of PE or Boerhaave syndrome on CT imaging.  No acute aortic dissection.  Lung fields were clear.  At this time, favor likely diabetic ketoacidosis as the primary etiology of the patient's presentation.  Hospitalist medicine was consulted for admission to an [facility] status.   Final Clinical Impression(s) / ED Diagnoses Final diagnoses:  Diabetic ketoacidosis without coma associated with type 1 diabetes mellitus [facility])    Rx / DC Orders ED Discharge Orders     None        Ernie Avena, MD 05/29/21 0010

## 2021-05-28 NOTE — ED Triage Notes (Signed)
Pt is type 1 diabetic, insulin pump has been broken a few months, been taking injections. States glucometer been reading HIGH for 2 weeks. Been having shortness of breath x 3 weeks and N/V x 4 days.

## 2021-05-29 ENCOUNTER — Encounter (HOSPITAL_COMMUNITY): Payer: Self-pay | Admitting: Family Medicine

## 2021-05-29 DIAGNOSIS — R1310 Dysphagia, unspecified: Secondary | ICD-10-CM | POA: Diagnosis not present

## 2021-05-29 DIAGNOSIS — R0789 Other chest pain: Secondary | ICD-10-CM | POA: Diagnosis not present

## 2021-05-29 DIAGNOSIS — F32A Depression, unspecified: Secondary | ICD-10-CM | POA: Diagnosis present

## 2021-05-29 DIAGNOSIS — Z8616 Personal history of COVID-19: Secondary | ICD-10-CM | POA: Diagnosis not present

## 2021-05-29 DIAGNOSIS — E1010 Type 1 diabetes mellitus with ketoacidosis without coma: Secondary | ICD-10-CM | POA: Diagnosis present

## 2021-05-29 DIAGNOSIS — Y92239 Unspecified place in hospital as the place of occurrence of the external cause: Secondary | ICD-10-CM | POA: Diagnosis not present

## 2021-05-29 DIAGNOSIS — Z20822 Contact with and (suspected) exposure to covid-19: Secondary | ICD-10-CM | POA: Diagnosis present

## 2021-05-29 DIAGNOSIS — W010XXA Fall on same level from slipping, tripping and stumbling without subsequent striking against object, initial encounter: Secondary | ICD-10-CM | POA: Diagnosis not present

## 2021-05-29 DIAGNOSIS — R079 Chest pain, unspecified: Secondary | ICD-10-CM | POA: Diagnosis present

## 2021-05-29 DIAGNOSIS — G8929 Other chronic pain: Secondary | ICD-10-CM | POA: Diagnosis present

## 2021-05-29 DIAGNOSIS — Z833 Family history of diabetes mellitus: Secondary | ICD-10-CM | POA: Diagnosis not present

## 2021-05-29 DIAGNOSIS — M545 Low back pain, unspecified: Secondary | ICD-10-CM | POA: Diagnosis present

## 2021-05-29 DIAGNOSIS — E871 Hypo-osmolality and hyponatremia: Secondary | ICD-10-CM | POA: Diagnosis present

## 2021-05-29 DIAGNOSIS — F17210 Nicotine dependence, cigarettes, uncomplicated: Secondary | ICD-10-CM | POA: Diagnosis present

## 2021-05-29 DIAGNOSIS — M48061 Spinal stenosis, lumbar region without neurogenic claudication: Secondary | ICD-10-CM | POA: Diagnosis present

## 2021-05-29 DIAGNOSIS — E875 Hyperkalemia: Secondary | ICD-10-CM | POA: Diagnosis not present

## 2021-05-29 DIAGNOSIS — S025XXA Fracture of tooth (traumatic), initial encounter for closed fracture: Secondary | ICD-10-CM | POA: Diagnosis present

## 2021-05-29 DIAGNOSIS — K3184 Gastroparesis: Secondary | ICD-10-CM | POA: Diagnosis present

## 2021-05-29 DIAGNOSIS — S0990XA Unspecified injury of head, initial encounter: Secondary | ICD-10-CM | POA: Diagnosis not present

## 2021-05-29 DIAGNOSIS — K2970 Gastritis, unspecified, without bleeding: Secondary | ICD-10-CM | POA: Diagnosis not present

## 2021-05-29 DIAGNOSIS — G4700 Insomnia, unspecified: Secondary | ICD-10-CM | POA: Diagnosis present

## 2021-05-29 DIAGNOSIS — Z9119 Patient's noncompliance with other medical treatment and regimen: Secondary | ICD-10-CM | POA: Diagnosis not present

## 2021-05-29 DIAGNOSIS — F410 Panic disorder [episodic paroxysmal anxiety] without agoraphobia: Secondary | ICD-10-CM | POA: Diagnosis present

## 2021-05-29 DIAGNOSIS — F418 Other specified anxiety disorders: Secondary | ICD-10-CM | POA: Diagnosis not present

## 2021-05-29 DIAGNOSIS — E1043 Type 1 diabetes mellitus with diabetic autonomic (poly)neuropathy: Secondary | ICD-10-CM | POA: Diagnosis present

## 2021-05-29 DIAGNOSIS — Z765 Malingerer [conscious simulation]: Secondary | ICD-10-CM | POA: Diagnosis not present

## 2021-05-29 DIAGNOSIS — R1013 Epigastric pain: Secondary | ICD-10-CM | POA: Diagnosis not present

## 2021-05-29 DIAGNOSIS — M5416 Radiculopathy, lumbar region: Secondary | ICD-10-CM | POA: Diagnosis present

## 2021-05-29 DIAGNOSIS — E876 Hypokalemia: Secondary | ICD-10-CM | POA: Diagnosis not present

## 2021-05-29 DIAGNOSIS — D751 Secondary polycythemia: Secondary | ICD-10-CM | POA: Diagnosis present

## 2021-05-29 LAB — CBC
HCT: 39.3 % (ref 36.0–46.0)
Hemoglobin: 13.3 g/dL (ref 12.0–15.0)
MCH: 29 pg (ref 26.0–34.0)
MCHC: 33.8 g/dL (ref 30.0–36.0)
MCV: 85.6 fL (ref 80.0–100.0)
Platelets: 237 10*3/uL (ref 150–400)
RBC: 4.59 MIL/uL (ref 3.87–5.11)
RDW: 12.9 % (ref 11.5–15.5)
WBC: 8.6 10*3/uL (ref 4.0–10.5)
nRBC: 0 % (ref 0.0–0.2)

## 2021-05-29 LAB — CBC WITH DIFFERENTIAL/PLATELET
Abs Immature Granulocytes: 0.03 10*3/uL (ref 0.00–0.07)
Basophils Absolute: 0 10*3/uL (ref 0.0–0.1)
Basophils Relative: 1 %
Eosinophils Absolute: 0.3 10*3/uL (ref 0.0–0.5)
Eosinophils Relative: 5 %
HCT: 38.5 % (ref 36.0–46.0)
Hemoglobin: 13.1 g/dL (ref 12.0–15.0)
Immature Granulocytes: 1 %
Lymphocytes Relative: 45 %
Lymphs Abs: 3 10*3/uL (ref 0.7–4.0)
MCH: 28.9 pg (ref 26.0–34.0)
MCHC: 34 g/dL (ref 30.0–36.0)
MCV: 84.8 fL (ref 80.0–100.0)
Monocytes Absolute: 0.6 10*3/uL (ref 0.1–1.0)
Monocytes Relative: 9 %
Neutro Abs: 2.5 10*3/uL (ref 1.7–7.7)
Neutrophils Relative %: 39 %
Platelets: 216 10*3/uL (ref 150–400)
RBC: 4.54 MIL/uL (ref 3.87–5.11)
RDW: 13 % (ref 11.5–15.5)
WBC: 6.4 10*3/uL (ref 4.0–10.5)
nRBC: 0 % (ref 0.0–0.2)

## 2021-05-29 LAB — GLUCOSE, CAPILLARY
Glucose-Capillary: 160 mg/dL — ABNORMAL HIGH (ref 70–99)
Glucose-Capillary: 147 mg/dL — ABNORMAL HIGH (ref 70–99)
Glucose-Capillary: 191 mg/dL — ABNORMAL HIGH (ref 70–99)
Glucose-Capillary: 208 mg/dL — ABNORMAL HIGH (ref 70–99)
Glucose-Capillary: 182 mg/dL — ABNORMAL HIGH (ref 70–99)
Glucose-Capillary: 140 mg/dL — ABNORMAL HIGH (ref 70–99)
Glucose-Capillary: 163 mg/dL — ABNORMAL HIGH (ref 70–99)
Glucose-Capillary: 176 mg/dL — ABNORMAL HIGH (ref 70–99)
Glucose-Capillary: 219 mg/dL — ABNORMAL HIGH (ref 70–99)
Glucose-Capillary: 221 mg/dL — ABNORMAL HIGH (ref 70–99)

## 2021-05-29 LAB — HEMOGLOBIN A1C
Hgb A1c MFr Bld: 11.7 % — ABNORMAL HIGH (ref 4.8–5.6)
Mean Plasma Glucose: 289.09 mg/dL

## 2021-05-29 LAB — COMPREHENSIVE METABOLIC PANEL
ALT: 11 U/L (ref 0–44)
AST: 15 U/L (ref 15–41)
Albumin: 2.8 g/dL — ABNORMAL LOW (ref 3.5–5.0)
Alkaline Phosphatase: 72 U/L (ref 38–126)
Anion gap: 6 (ref 5–15)
BUN: <5 mg/dL — ABNORMAL LOW (ref 6–20)
CO2: 22 mmol/L (ref 22–32)
Calcium: 8.2 mg/dL — ABNORMAL LOW (ref 8.9–10.3)
Chloride: 104 mmol/L (ref 98–111)
Creatinine, Ser: 0.35 mg/dL — ABNORMAL LOW (ref 0.44–1.00)
GFR, Estimated: >60 mL/min (ref >60)
Glucose, Bld: 178 mg/dL — ABNORMAL HIGH (ref 70–99)
Potassium: 3.7 mmol/L (ref 3.5–5.1)
Sodium: 132 mmol/L — ABNORMAL LOW (ref 135–145)
Total Bilirubin: 0.7 mg/dL (ref 0.3–1.2)
Total Protein: 5.6 g/dL — ABNORMAL LOW (ref 6.5–8.1)

## 2021-05-29 LAB — MAGNESIUM
Magnesium: 1.5 mg/dL — ABNORMAL LOW (ref 1.7–2.4)
Magnesium: 1.8 mg/dL (ref 1.7–2.4)

## 2021-05-29 LAB — BASIC METABOLIC PANEL
Anion gap: 9 (ref 5–15)
BUN: 7 mg/dL (ref 6–20)
CO2: 22 mmol/L (ref 22–32)
Calcium: 9 mg/dL (ref 8.9–10.3)
Chloride: 102 mmol/L (ref 98–111)
Creatinine, Ser: 0.38 mg/dL — ABNORMAL LOW (ref 0.44–1.00)
GFR, Estimated: >60 mL/min (ref >60)
Glucose, Bld: 152 mg/dL — ABNORMAL HIGH (ref 70–99)
Potassium: 3.4 mmol/L — ABNORMAL LOW (ref 3.5–5.1)
Sodium: 133 mmol/L — ABNORMAL LOW (ref 135–145)

## 2021-05-29 LAB — HIV ANTIBODY (ROUTINE TESTING W REFLEX): HIV Screen 4th Generation wRfx: NONREACTIVE

## 2021-05-29 LAB — CBG MONITORING, ED: Glucose-Capillary: 133 mg/dL — ABNORMAL HIGH (ref 70–99)

## 2021-05-29 LAB — BETA-HYDROXYBUTYRIC ACID: Beta-Hydroxybutyric Acid: 5.56 mmol/L — ABNORMAL HIGH (ref 0.05–0.27)

## 2021-05-29 LAB — PHOSPHORUS: Phosphorus: 2.3 mg/dL — ABNORMAL LOW (ref 2.5–4.6)

## 2021-05-29 MED ORDER — MAGNESIUM SULFATE 2 GM/50ML IV SOLN
2.0000 g | Freq: Once | INTRAVENOUS | Status: AC
  Administered 2021-05-29: 2 g via INTRAVENOUS
  Filled 2021-05-29: qty 50

## 2021-05-29 MED ORDER — HYDROMORPHONE HCL 1 MG/ML IJ SOLN
1.0000 mg | INTRAMUSCULAR | Status: AC | PRN
  Administered 2021-05-29 (×3): 1 mg via INTRAVENOUS
  Filled 2021-05-29 (×3): qty 1

## 2021-05-29 MED ORDER — PROMETHAZINE HCL 25 MG PO TABS
12.5000 mg | ORAL_TABLET | Freq: Four times a day (QID) | ORAL | Status: DC | PRN
Start: 2021-05-29 — End: 2021-06-05
  Administered 2021-05-30 – 2021-06-04 (×5): 12.5 mg via ORAL
  Filled 2021-05-29 (×8): qty 1

## 2021-05-29 MED ORDER — ACETAMINOPHEN 325 MG PO TABS
650.0000 mg | ORAL_TABLET | Freq: Four times a day (QID) | ORAL | Status: DC | PRN
  Administered 2021-05-29: 650 mg via ORAL
  Filled 2021-05-29: qty 2

## 2021-05-29 MED ORDER — LIDOCAINE VISCOUS HCL 2 % MT SOLN
15.0000 mL | Freq: Once | OROMUCOSAL | Status: AC
  Administered 2021-05-29: 15 mL via ORAL
  Filled 2021-05-29: qty 15

## 2021-05-29 MED ORDER — METOCLOPRAMIDE HCL 5 MG/ML IJ SOLN
5.0000 mg | Freq: Three times a day (TID) | INTRAMUSCULAR | Status: DC
  Administered 2021-05-29 – 2021-05-30 (×3): 5 mg via INTRAVENOUS
  Filled 2021-05-29 (×3): qty 2

## 2021-05-29 MED ORDER — INSULIN DETEMIR 100 UNIT/ML ~~LOC~~ SOLN
10.0000 [IU] | Freq: Two times a day (BID) | SUBCUTANEOUS | Status: DC
  Administered 2021-05-29 – 2021-06-02 (×9): 10 [IU] via SUBCUTANEOUS
  Filled 2021-05-29 (×11): qty 0.1

## 2021-05-29 MED ORDER — PROMETHAZINE HCL 12.5 MG RE SUPP
12.5000 mg | Freq: Four times a day (QID) | RECTAL | Status: DC | PRN
  Administered 2021-05-29: 12.5 mg via RECTAL
  Filled 2021-05-29 (×2): qty 1

## 2021-05-29 MED ORDER — KETOROLAC TROMETHAMINE 30 MG/ML IJ SOLN
30.0000 mg | Freq: Four times a day (QID) | INTRAMUSCULAR | Status: AC | PRN
  Administered 2021-05-30 – 2021-06-01 (×6): 30 mg via INTRAVENOUS
  Filled 2021-05-29 (×7): qty 1

## 2021-05-29 MED ORDER — SCOPOLAMINE 1 MG/3DAYS TD PT72
1.0000 | MEDICATED_PATCH | TRANSDERMAL | Status: DC
  Administered 2021-05-29: 1.5 mg via TRANSDERMAL
  Filled 2021-05-29 (×3): qty 1

## 2021-05-29 MED ORDER — HYDROXYZINE HCL 25 MG PO TABS
25.0000 mg | ORAL_TABLET | Freq: Three times a day (TID) | ORAL | Status: DC | PRN
  Administered 2021-05-30 – 2021-06-04 (×8): 25 mg via ORAL
  Filled 2021-05-29 (×8): qty 1

## 2021-05-29 MED ORDER — ZOLPIDEM TARTRATE 5 MG PO TABS
5.0000 mg | ORAL_TABLET | Freq: Every evening | ORAL | Status: DC | PRN
  Administered 2021-06-01 – 2021-06-04 (×4): 5 mg via ORAL
  Filled 2021-05-29 (×5): qty 1

## 2021-05-29 MED ORDER — ACETAMINOPHEN 650 MG RE SUPP: 650.0000 mg | Freq: Four times a day (QID) | RECTAL | Status: DC | PRN

## 2021-05-29 MED ORDER — ENOXAPARIN SODIUM 40 MG/0.4ML IJ SOSY
40.0000 mg | PREFILLED_SYRINGE | Freq: Every day | INTRAMUSCULAR | Status: DC
  Administered 2021-05-29 – 2021-06-04 (×7): 40 mg via SUBCUTANEOUS
  Filled 2021-05-29 (×8): qty 0.4

## 2021-05-29 MED ORDER — POTASSIUM CHLORIDE 10 MEQ/100ML IV SOLN
10.0000 meq | INTRAVENOUS | Status: AC
  Administered 2021-05-29 (×2): 10 meq via INTRAVENOUS
  Filled 2021-05-29 (×2): qty 100

## 2021-05-29 MED ORDER — FAMOTIDINE 20 MG IN NS 100 ML IVPB
20.0000 mg | Freq: Two times a day (BID) | INTRAVENOUS | Status: DC
  Administered 2021-05-29 – 2021-06-01 (×7): 20 mg via INTRAVENOUS
  Filled 2021-05-29 (×9): qty 100

## 2021-05-29 MED ORDER — GABAPENTIN 300 MG PO CAPS
300.0000 mg | ORAL_CAPSULE | Freq: Three times a day (TID) | ORAL | Status: DC
  Administered 2021-05-29 – 2021-05-31 (×7): 300 mg via ORAL
  Filled 2021-05-29 (×7): qty 1

## 2021-05-29 MED ORDER — INSULIN DETEMIR 100 UNIT/ML ~~LOC~~ SOLN
10.0000 [IU] | SUBCUTANEOUS | Status: DC
  Administered 2021-05-29: 10 [IU] via SUBCUTANEOUS
  Filled 2021-05-29 (×2): qty 0.1

## 2021-05-29 MED ORDER — SODIUM CHLORIDE 0.9 % IV SOLN
6.2500 mg | Freq: Four times a day (QID) | INTRAVENOUS | Status: DC | PRN
  Administered 2021-05-29 – 2021-06-02 (×9): 6.25 mg via INTRAVENOUS
  Filled 2021-05-29 (×13): qty 0.25

## 2021-05-29 MED ORDER — INSULIN ASPART 100 UNIT/ML IJ SOLN
4.0000 [IU] | Freq: Three times a day (TID) | INTRAMUSCULAR | Status: DC
  Administered 2021-05-29 – 2021-06-03 (×11): 4 [IU] via SUBCUTANEOUS

## 2021-05-29 MED ORDER — ALUM & MAG HYDROXIDE-SIMETH 200-200-20 MG/5ML PO SUSP
30.0000 mL | Freq: Once | ORAL | Status: AC
  Administered 2021-05-29: 30 mL via ORAL
  Filled 2021-05-29 (×2): qty 30

## 2021-05-29 MED ORDER — INSULIN ASPART 100 UNIT/ML IJ SOLN
0.0000 [IU] | Freq: Three times a day (TID) | INTRAMUSCULAR | Status: DC
  Administered 2021-05-29 – 2021-05-30 (×4): 3 [IU] via SUBCUTANEOUS
  Administered 2021-05-31: 9 [IU] via SUBCUTANEOUS
  Administered 2021-05-31 – 2021-06-01 (×2): 3 [IU] via SUBCUTANEOUS
  Administered 2021-06-02 (×2): 2 [IU] via SUBCUTANEOUS
  Administered 2021-06-03: 9 [IU] via SUBCUTANEOUS

## 2021-05-29 MED ORDER — SENNOSIDES-DOCUSATE SODIUM 8.6-50 MG PO TABS: 1.0000 | ORAL_TABLET | Freq: Every evening | ORAL | Status: DC | PRN

## 2021-05-29 NOTE — H&P (View-Only) (Signed)
Referring Provider:   Dr. Raiford Noble Primary Care Physician:  [facility] Primary Gastroenterologist: Althia Forts   Reason for Consultation: Dysphagia  HPI: Julie [name] is a 30 y.o. female with a past medical history of diabetes mellitus type 1, bulimia 10 years ago, COVID 28 infection 04/2021 and lower back pain status post lumbar laminectomy.  She was diagnosed with Covid 19 about one month ago, she reported receiving an antibiotic and steroid inhaler per her PCP.  She developed chest pain since being diagnosed with Covid 19. She presented to [facility] ED on 05/17/2021. Labs showed WBC 9.6. Hg 14.2. Na 127. K 4.6. Alk phos 122. T.bili 0.33. AST 19. ALT 14. Ano evidence of AKA. A chest xray was negative. Chest CTA without evidence of a PE or infiltrate/pleural effusion. She received IV fluids, antiemetic, Toradol and Morphine and her symptoms improved so she was discharged home.   She presented to [place] ED on 05/18/2021 with complaints of midsternal chest pain, pain when swallowing solid food and liquids. She coughed up mucous with a small amount of blood x 2. A chest xray was negative. Since she had a negative chest CTA done 05/17/2021 there was low suspicion for a PE and further imaging was not done. NA 133. Glu 308. She received IV fluids, Ativan, antiemetics and a GI cocktail and hers symptoms improved and she was discharged home.   She presented to [place] ED on 05/21/2021 with chest pain and hyperglycemia.  In the ED days prior with a negative evaluation.  A twelve-lead EKG showed sinus tachycardia without evidence of acute ischemia.  Normal troponin levels x2. She received antiemetics, analgesics, Pepcid and Toradol and her symptoms improved.  Abdominal x-ray was negative.  She was discharged home with instructions to follow-up with her PCP.  She returned to [place] ED on 05/28/2021 with uncontrolled hyperglycemia.  She reported her  insulin pump broke 3 months ago and she has been managing her diabetes with insulin injections since then.  She continued to have chest pain which radiated to her back associated with epigastric pain and nausea/vomiting.  Labs in the ED showed a sodium level of 124.  Potassium 4.7.  Chloride 92.  Glucose 619.  BUN 14.  Creatinine 0.80.  Calcium 8.7.  Anion gap 17.  Lipase 19.  WBC 9.7.  Hemoglobin 15.8.  Hematocrit 47.2.  Platelet 277. She was transferred to [facility] for admission and further evaluation.   Laboratory studies 05/29/2021: Sodium 132.  Potassium 3.7.  Glucose 178.  BUN less than 5.  Creatinine 0.35.  Magnesium 2.8.  Alk phos 72.  AST 15.  ALT 11.  Total bili 0.7.  She describes having odynophagia, drinking any water or swallowing any solid food results in mid esophageal pain. Food feels likes it gets stuck with significant esophageal pain. She presses on her chest and in a minute or two the pain eases and the food or liquid passes down the esophagus. She has nausea and vomits up nonbloody clears to bilious emesis once or twice daily for the past few weeks but not every day.  No hematemesis. No NSAID use. No alcohol use. No drug use. Past history of bulimia, denies inducting vomiting at this time. She typically passes a normal brown stool daily but had nonbloody diarrhea for 4 to 5 days then no BM for the past 2 days. She has 4 young children. Stress level is high. Denies chance of pregnancy, has an IUD.  Chest CTA 05/17/2021: Pulmonary arteries: No pulmonary emboli. Thoracic aorta: No aneurysm or dissection. Mediastinal and hilar structures: No mass or adenopathy. Lungs: No significant abnormality. Bony structures: Unremarkable Visualized upper abdomen: Unremarkable Impression:  IMPRESSION: No vascular filling defect to suggest pulmonary emboli.  No acute infiltrate or pleural effusion.   Past Medical History:  Diagnosis Date   Bulimia nervosa    Dental caries    Diabetes mellitus  without complication ([place])    Genital warts    Noncompliance with diabetes treatment    Pelvic fracture [facility])     Past Surgical History:  Procedure Laterality Date   CESAREAN SECTION     CESAREAN SECTION  2012    Prior to Admission medications   Medication Sig Start Date End Date Taking? Authorizing Provider  gabapentin (NEURONTIN) 800 MG tablet Take 800 mg by mouth in the morning, at noon, in the evening, and at bedtime. 05/22/19  Yes [provider]  hydrOXYzine (ATARAX/VISTARIL) 25 MG tablet Take 12.5-25 mg by mouth every 8 (eight) hours as needed for anxiety. 01/10/21  Yes [provider]  naloxone (NARCAN) nasal spray 4 mg/0.1 mL Place 1 spray into the nose as needed. 03/02/21  Yes [provider]  promethazine (PHENERGAN) 25 MG tablet Take 25 mg by mouth every 8 (eight) hours as needed for nausea or vomiting. 04/18/20  Yes [provider]  busPIRone (BUSPAR) 15 MG tablet Take 15 mg by mouth 3 (three) times daily as needed for anxiety. 05/11/21   [provider]  clonazePAM (KLONOPIN) 0.5 MG tablet Take 0.5-1 mg by mouth 2 (two) times daily as needed. 08/31/20   [provider]  ergocalciferol (VITAMIN D2) 1.25 MG (50000 UT) capsule Take 50,000 Units by mouth once a week. 10/09/19   [provider]  famotidine (PEPCID) 20 MG tablet Take 1 tablet (20 mg total) by mouth 2 (two) times daily. 05/21/21   Khatri, Hina, PA-C  insulin aspart (NOVOLOG FLEXPEN) 100 UNIT/ML FlexPen Inject 12 Units into the skin 3 (three) times daily with meals. 04/10/17   Davonna Belling, MD  insulin detemir (LEVEMIR) 100 UNIT/ML injection Inject 35 Units into the skin daily.    [provider]  insulin glargine (LANTUS) 100 UNIT/ML injection Inject 55 Units into the skin at bedtime.    [provider]  Insulin Pen Needle (NOVOFINE) 30G X 8 MM MISC Inject 10 each into the skin as needed. 07/27/13   Samella Parr, NP  oxyCODONE (OXY  IR/ROXICODONE) 5 MG immediate release tablet Take 10 mg by mouth every 8 (eight) hours as needed for pain. 05/23/21   [provider]  promethazine-dextromethorphan (PROMETHAZINE-DM) 6.25-15 MG/5ML syrup Take 1.3 mLs by mouth 4 (four) times daily as needed for cough. 05/18/21   Horton, Drue Dun M, DO  QUEtiapine (SEROQUEL) 50 MG tablet Take 50 mg by mouth at bedtime as needed for sleep. 05/11/21   [provider]  sucralfate (CARAFATE) 1 g tablet Take 1 tablet (1 g total) by mouth 4 (four) times daily -  with meals and at bedtime. 05/21/21   Khatri, Nicanor Alcon, PA-C  SYMBICORT 160-4.5 MCG/ACT inhaler Inhale 2 puffs into the lungs in the morning and at bedtime. 04/21/21   [provider]  traMADol (ULTRAM) 50 MG tablet Take 50 mg by mouth 4 (four) times daily as needed for moderate pain. 05/13/19   [provider]  VYVANSE 40 MG capsule Take 40 mg by mouth daily. 05/24/21   [provider]  zolpidem (AMBIEN) 10 MG tablet Take 10 mg by mouth at bedtime as needed for sleep. 05/24/21   [provider]    Current Facility-Administered Medications  Medication Dose Route Frequency Provider Last Rate Last Admin   acetaminophen (TYLENOL) tablet 650 mg  650 mg Oral Q6H PRN Opyd, Ilene Qua, MD   650 mg at 05/29/21 1112   Or   acetaminophen (TYLENOL) suppository 650 mg  650 mg Rectal Q6H PRN Opyd, Ilene Qua, MD       dextrose 5 % in lactated ringers infusion   Intravenous Continuous Opyd, Ilene Qua, MD 125 mL/hr at 05/29/21 0727 New Bag at 05/29/21 0727   dextrose 50 % solution 0-50 mL  0-50 mL Intravenous PRN Opyd, Ilene Qua, MD       enoxaparin (LOVENOX) injection 40 mg  40 mg Subcutaneous Daily Opyd, Ilene Qua, MD   40 mg at 05/29/21 1100   famotidine (PEPCID) IVPB 20 mg in NS 100 mL IVPB  20 mg Intravenous Q12H Opyd, Ilene Qua, MD 200 mL/hr at 05/29/21 0544 20 mg at 05/29/21 0544   gabapentin (NEURONTIN) capsule 300 mg  300 mg Oral TID Vianne Bulls, MD   300 mg at  05/29/21 1100   HYDROmorphone (DILAUDID) injection 1 mg  1 mg Intravenous Q3H PRN Sheikh, Omair Latif, DO       hydrOXYzine (ATARAX/VISTARIL) tablet 25 mg  25 mg Oral TID PRN Opyd, Ilene Qua, MD       insulin aspart (novoLOG) injection 0-9 Units  0-9 Units Subcutaneous TID WC Opyd, Ilene Qua, MD       insulin aspart (novoLOG) injection 4 Units  4 Units Subcutaneous TID WC Opyd, Ilene Qua, MD       insulin detemir (LEVEMIR) injection 10 Units  10 Units Subcutaneous Q24H Opyd, Ilene Qua, MD   10 Units at 05/29/21 0733   insulin regular, human (MYXREDLIN) 100 units/ 100 mL infusion   Intravenous Continuous Opyd, Ilene Qua, MD   Stopped at 05/29/21 1117   ketorolac (TORADOL) 30 MG/ML injection 30 mg  30 mg Intravenous Q6H PRN Opyd, Ilene Qua, MD       magnesium sulfate IVPB 2 g 50 mL  2 g Intravenous Once Sheikh, Omair Latif, DO       metoCLOPramide (REGLAN) injection 5 mg  5 mg Intravenous Q8H Sheikh, Omair Latif, DO       promethazine (PHENERGAN) tablet 12.5 mg  12.5 mg Oral Q6H PRN Opyd, Ilene Qua, MD       Or   promethazine (PHENERGAN) 6.25 mg in sodium chloride 0.9 % 50 mL IVPB  6.25 mg Intravenous Q6H PRN Opyd, Ilene Qua, MD       Or   promethazine (PHENERGAN) suppository 12.5 mg  12.5 mg Rectal Q6H PRN Opyd, Ilene Qua, MD   12.5 mg at 05/29/21 0551   scopolamine (TRANSDERM-SCOP) 1 MG/3DAYS 1.5 mg  1 patch Transdermal Q72H Sheikh, Omair Latif, DO       senna-docusate (Senokot-S) tablet 1 tablet  1 tablet Oral QHS PRN Opyd, Ilene Qua, MD       zolpidem (AMBIEN) tablet 5 mg  5 mg Oral QHS PRN Opyd, Ilene Qua, MD        Allergies as of 05/28/2021   (No Known Allergies)    Family History  Problem Relation Age of Onset   Diabetes Mother     Social History   Socioeconomic History   Marital status: Legally Separated  Spouse name: Not on file   Number of children: Not on file   Years of education: Not on file   Highest education level: Not on file  Occupational History   Not on file   Tobacco Use   Smoking status: Every Day    Types: Cigarettes   Smokeless tobacco: Never  Vaping Use   Vaping Use: Never used  Substance and Sexual Activity   Alcohol use: No   Drug use: Not Currently   Sexual activity: Not on file  Other Topics Concern   Not on file  Social History Narrative   Not on file   Social Determinants of Health   Financial Resource Strain: Not on file  Food Insecurity: Not on file  Transportation Needs: Not on file  Physical Activity: Not on file  Stress: Not on file  Social Connections: Not on file  Intimate Partner Violence: Not on file    Review of Systems: See HPI, all other systems reviewed and are negative.    Physical Exam: Vital signs in last 24 hours: Temp:  [98.1 F (36.7 C)-99.1 F (37.3 C)] 98.9 F (37.2 C) (09/09 0409) Pulse Rate:  [80-139] 80 (09/09 0409) Resp:  [12-26] 12 (09/09 0409) BP: (115-143)/(57-110) 126/74 (09/09 0800) SpO2:  [98 %-100 %] 100 % (09/09 0409) Weight:  [74.8 kg] 74.8 kg (09/08 1858) Last BM Date: 05/28/21 General:  Tearful 30 year old female in NAD. Head:  Normocephalic and atraumatic. Eyes:  No scleral icterus. Conjunctiva pink. Ears:  Normal auditory acuity. Nose:  No deformity, discharge or lesions. Mouth:  Dentition intact. No ulcers or lesions.  Neck:  Supple. No lymphadenopathy or thyromegaly.  Lungs:  Clear throughout.  Heart:  RRR, no murmur.  Chest : Mild tenderness to percussion.  Abdomen:  Soft, epigastric tenderness without rebound or guarding.  Rectal: Deferred. Musculoskeletal:  Symmetrical without gross deformities.  Pulses:  Normal pulses noted. Extremities:  Without clubbing or edema. Neurologic:  Alert and  oriented x4. No focal deficits.  Skin:  Intact without significant lesions or rashes. Psych:  Alert and cooperative. Normal mood and affect.  Intake/Output from previous day: 09/08 0701 - 09/09 0700 In: 2527.3 [I.V.:1219.4; IV Piggyback:1307.9] Out: -  Intake/Output  this shift: No intake/output data recorded.  Lab Results: Recent Labs    05/28/21 1900 05/28/21 1921 05/29/21 0327 05/29/21 1018  WBC 9.7  --  8.6 6.4  HGB 15.8* 16.7* 13.3 13.1  HCT 47.2* 49.0* 39.3 38.5  PLT 277  --  237 216   BMET Recent Labs    05/28/21 1937 05/29/21 0303 05/29/21 1018  NA 124* 133* 132*  K 4.7 3.4* 3.7  CL 92* 102 104  CO2 15* 22 22  GLUCOSE 619* 152* 178*  BUN 14 7 <5*  CREATININE 0.80 0.38* 0.35*  CALCIUM 8.7* 9.0 8.2*   LFT Recent Labs    05/29/21 1018  PROT 5.6*  ALBUMIN 2.8*  AST 15  ALT 11  ALKPHOS 72  BILITOT 0.7   PT/INR No results for input(s): LABPROT, INR in the last 72 hours. Hepatitis Panel No results for input(s): HEPBSAG, HCVAB, HEPAIGM, HEPBIGM in the last 72 hours.    Studies/Results: CT Angio Chest PE W and/or Wo Contrast  Result Date: 05/28/2021 CLINICAL DATA:  Chest pain hyperglycemia EXAM: CT ANGIOGRAPHY CHEST WITH CONTRAST TECHNIQUE: Multidetector CT imaging of the chest was performed using the standard protocol during bolus administration of intravenous contrast. Multiplanar CT image reconstructions and MIPs were obtained to  evaluate the vascular anatomy. CONTRAST:  150m OMNIPAQUE IOHEXOL 350 MG/ML SOLN COMPARISON:  Chest x-ray 05/18/2021 FINDINGS: Cardiovascular: Satisfactory opacification of the pulmonary arteries to the segmental level. No evidence of pulmonary embolism. Normal heart size. No pericardial effusion. Nonaneurysmal aorta. No dissection is seen. Mediastinum/Nodes: No enlarged mediastinal, hilar, or axillary lymph nodes. Thyroid gland, trachea, and esophagus demonstrate no significant findings. Lungs/Pleura: Lungs are clear. No pleural effusion or pneumothorax. Upper Abdomen: No acute abnormality. Musculoskeletal: No chest wall abnormality. No acute or significant osseous findings. Review of the MIP images confirms the above findings. IMPRESSION: Negative. No CT evidence for acute pulmonary embolus or aortic  dissection. Clear lung fields Electronically Signed   By: KDonavan FoilM.D.   On: 05/28/2021 21:20    IMPRESSION/PLAN: 127 30year old female with poorly controlled DM with recurrent  N/V, chest pain, epigastric pain, dysphagia and odynophagia evaluated in the ED 4 times within the past month which all started after she had Covid and was prescribed an antibiotic and a steroid inhaler. I suspect she has esophageal candidiasis as well as a component of gastroparesis. SLP at bed side to attempt swallow study, limited results as the patient experienced odynophagia with ice chips.  -PPI IV BID -Clear liquid diet -Zofran 431mIV or po Q 6 hrs PRN -EGD to rule out erosive esophagitis vs candidiasis esophagitis and PUD, timing to be verified by Dr. GuLyndel Safe-CBC and BMP in am -Eventual gastric empty study as out patient to rule out diabetic  gastroparesis  -IV fluids and pain management per the hospitalist  -Follow up with endocrinologist regarding poorly controlled diabetes which is contributing to her recurrent GI symptoms   2) Hyponatremia, improving    CoNoralyn Pick9/05/2021, 12:25 PM    Attending physician's note   I have taken an interval history, reviewed the chart and examined the patient. I agree with the Advanced Practitioner's note, impression and recommendations.   Recurrent N/V with epi pain, atypical chest pain, dysphagia/odynophagia with ?hemopytsis. Nl lipase. Neg CTA chest Recent Covid-19 (treated with A/Bs, steroids) Poorly controlled DM1 with DKA (HBA1c > 11), off insulin pump  Plan: -IV protonix -IV reglan/phenergan/zofran PRN -EGD in AM -Monitor and correct electrolytes. Keep K>4, Mg>2 -Best possible control of DM -Trend CBC, CMP -GES as outpt.   RaCarmell AustriaMD LeVelora HecklerI [phone]

## 2021-05-29 NOTE — H&P (Signed)
History and Physical    Eniya Cannady NKN:[id number] DOB: 1991-08-22 DOA: 05/28/2021  PCP: [facility]   Patient coming from: Home   Chief Complaint: high blood sugar, chest pain, N/V   HPI: Shantara Goosby is a 30 y.o. female with medical history significant for type 1 diabetes mellitus, anxiety, insomnia, chronic low back pain status post lumbar laminectomy, now presenting to the emergency department for evaluation of high blood sugar, chest pain, nausea, and vomiting.  Patient reports that she has been taking 15 units of Levemir every night and 6 units of NovoLog 3 times daily but her glucometer has been reading "high" for the past 2 weeks.  She has also been experiencing severe sharp central chest pain over the same interval, worse when she tries to eat or swallow anything.  She had 2 episodes of blood-tinged sputum roughly a week ago.  She has had some shortness of breath with minimal cough.  She reports being unable to eat anything in the past week due to the chest pain, nausea, and vomiting.  She denies lower extremity swelling or tenderness.  She denies any abdominal pain.  Denies diarrhea, melena, hematochezia.  On arrival to [facility], she asks for more IV Dilaudid and IV Phenergan.   [facility] ED Course: Upon arrival to the ED, patient is found to be afebrile, saturating well on room air, tachycardic to the 130s, and with stable blood pressure.  EKG features sinus tachycardia 338.  CTA chest is negative for PE, acute cardiopulmonary disease, or acute mediastinal findings.  Chemistry panel with glucose 619, bicarbonate 15, anion gap 17.  CBC with mild polycythemia.  High-sensitivity troponin undetectable x2 and lipase normal.  Urinalysis with glucosuria and ketonuria.  Patient was given 1.5 L of LR, 20 mill equivalents IV potassium, 1 mg IV Dilaudid, and started on IV insulin infusion.  She was transferred to [facility] for ongoing evaluation and  management.  Review of Systems:  All other systems reviewed and apart from HPI, are negative.  Past Medical History:  Diagnosis Date   Bulimia nervosa    Dental caries    Diabetes mellitus without complication (HCC)    Genital warts    Noncompliance with diabetes treatment    Pelvic fracture [facility])     Past Surgical History:  Procedure Laterality Date   CESAREAN SECTION     CESAREAN SECTION  2012    Social History:   reports that she has been smoking cigarettes. She has never used smokeless tobacco. She reports that she does not currently use drugs. She reports that she does not drink alcohol.  No Known Allergies  Family History  Problem Relation Age of Onset   Diabetes Mother      Prior to Admission medications   Medication Sig Start Date End Date Taking? Authorizing Provider  cephALEXin (KEFLEX) 500 MG capsule Take 1 capsule (500 mg total) by mouth 4 (four) times daily. 04/10/17   Benjiman Core, MD  clonazePAM (KLONOPIN) 0.5 MG tablet Take 0.5-1 mg by mouth 2 (two) times daily as needed. 08/31/20   [provider]  doxycycline (VIBRAMYCIN) 100 MG capsule Take 1 capsule (100 mg total) by mouth 2 (two) times daily. 03/30/15   Margarita Grizzle, MD  ergocalciferol (VITAMIN D2) 1.25 MG (50000 UT) capsule Take by mouth. 10/09/19   [provider]  famotidine (PEPCID) 20 MG tablet Take 1 tablet (20 mg total) by mouth 2 (two) times daily. 05/21/21   Dietrich Pates, PA-C  fluconazole (DIFLUCAN) 150 MG tablet Take 1 tablet (150 mg total) by mouth once. Can take additional dose three days later if symptoms persist 02/04/15   Katrinka Blazing, [place], CNM  gabapentin (NEURONTIN) 300 MG capsule  05/21/20   [provider]  insulin aspart (NOVOLOG FLEXPEN) 100 UNIT/ML FlexPen Inject 12 Units into the skin 3 (three) times daily with meals. 04/10/17   Benjiman Core, MD  insulin detemir (LEVEMIR) 100 UNIT/ML injection Inject 35 Units into the skin daily.    [provider]  insulin glargine (LANTUS) 100 UNIT/ML injection Inject 55 Units into the skin at bedtime.    [provider]  Insulin Pen Needle (NOVOFINE) 30G X 8 MM MISC Inject 10 each into the skin as needed. 07/27/13   Russella Dar, NP  Insulin Pen Needle (NOVOFINE) 30G X 8 MM MISC Inject 10 each into the skin as needed. 07/27/13   Russella Dar, NP  oxyCODONE-acetaminophen (PERCOCET/ROXICET) 5-325 MG per tablet Take 1 tablet by mouth every 4 (four) hours as needed for severe pain. 03/30/15   Margarita Grizzle, MD  promethazine-dextromethorphan (PROMETHAZINE-DM) 6.25-15 MG/5ML syrup Take 1.3 mLs by mouth 4 (four) times daily as needed for cough. 05/18/21   Horton, Clabe Seal, DO  sucralfate (CARAFATE) 1 g tablet Take 1 tablet (1 g total) by mouth 4 (four) times daily -  with meals and at bedtime. 05/21/21   Dietrich Pates, PA-C  traMADol Janean Sark) 50 MG tablet Take by mouth. 05/13/19   [provider]    Physical Exam: Vitals:   05/28/21 2015 05/28/21 2030 05/28/21 2330 05/29/21 0100  BP: (!) 138/91 123/80 115/81 121/87  Pulse: (!) 104 (!) 102 94 95  Resp: (!) 22 16 15    Temp:    98.1 F (36.7 C)  TempSrc:      SpO2: 100% 100% 98% 100%  Weight:      Height:        Constitutional: NAD, calm  Eyes: PERTLA, lids and conjunctivae normal ENMT: Mucous membranes are moist. Posterior pharynx clear of any exudate or lesions.   Neck: supple, no masses  Respiratory: no wheezing, no crackles. No accessory muscle use.  Cardiovascular: S1 & S2 heard, regular rate and rhythm. No extremity edema.   Abdomen: No distension, no tenderness, soft. Bowel sounds active.  Musculoskeletal: no clubbing / cyanosis. No joint deformity upper and lower extremities.   Skin: no significant rashes, lesions, ulcers. Warm, dry, well-perfused. Neurologic: CN 2-12 grossly intact. Moving all extremities.  Psychiatric: Alert and oriented to person, place, and situation. Tearful. Cooperative.    Labs and Imaging on  Admission: I have personally reviewed following labs and imaging studies  CBC: Recent Labs  Lab 05/28/21 1900 05/28/21 1921  WBC 9.7  --   NEUTROABS 6.1  --   HGB 15.8* 16.7*  HCT 47.2* 49.0*  MCV 85.7  --   PLT 277  --    Basic Metabolic Panel: Recent Labs  Lab 05/28/21 1921 05/28/21 1937  NA 122* 124*  K 4.3 4.7  CL  --  92*  CO2  --  15*  GLUCOSE  --  619*  BUN  --  14  CREATININE  --  0.80  CALCIUM  --  8.7*   GFR: Estimated Creatinine Clearance: 114.9 mL/min (by C-G formula based on SCr of 0.8 mg/dL). Liver Function Tests: No results for input(s): AST, ALT, ALKPHOS, BILITOT, PROT, ALBUMIN in the last 168 hours. Recent Labs  Lab 05/28/21 1937  LIPASE 19   No results for input(s): AMMONIA in the last 168 hours. Coagulation Profile: No results for input(s): INR, PROTIME in the last 168 hours. Cardiac Enzymes: No results for input(s): CKTOTAL, CKMB, CKMBINDEX, TROPONINI in the last 168 hours. BNP (last 3 results) No results for input(s): PROBNP in the last 8760 hours. HbA1C: No results for input(s): HGBA1C in the last 72 hours. CBG: Recent Labs  Lab 05/28/21 2045 05/28/21 2126 05/28/21 2253 05/29/21 0010 05/29/21 0205  GLUCAP 410* 369* 222* 133* 160*   Lipid Profile: No results for input(s): CHOL, HDL, LDLCALC, TRIG, CHOLHDL, LDLDIRECT in the last 72 hours. Thyroid Function Tests: No results for input(s): TSH, T4TOTAL, FREET4, T3FREE, THYROIDAB in the last 72 hours. Anemia Panel: No results for input(s): VITAMINB12, FOLATE, FERRITIN, TIBC, IRON, RETICCTPCT in the last 72 hours. Urine analysis:    Component Value Date/Time   COLORURINE STRAW (A) 05/28/2021 1937   APPEARANCEUR CLEAR 05/28/2021 1937   LABSPEC 1.015 05/28/2021 1937   PHURINE 6.0 05/28/2021 1937   GLUCOSEU >=500 (A) 05/28/2021 1937   HGBUR NEGATIVE 05/28/2021 1937   BILIRUBINUR NEGATIVE 05/28/2021 1937   KETONESUR 80 (A) 05/28/2021 1937   PROTEINUR NEGATIVE 05/28/2021 1937    UROBILINOGEN 0.2 02/04/2015 1125   NITRITE NEGATIVE 05/28/2021 1937   LEUKOCYTESUR NEGATIVE 05/28/2021 1937   Sepsis Labs: @LABRCNTIP (procalcitonin:4,lacticidven:4) ) Recent Results (from the past 240 hour(s))  Resp Panel by RT-PCR (Flu A&B, Covid) Nasopharyngeal Swab     Status: None   Collection Time: 05/28/21  7:37 PM   Specimen: Nasopharyngeal Swab; Nasopharyngeal(NP) swabs in vial transport medium  Result Value Ref Range Status   SARS Coronavirus 2 by RT PCR NEGATIVE NEGATIVE Final    Comment: (NOTE) SARS-CoV-2 target nucleic acids are NOT DETECTED.  The SARS-CoV-2 RNA is generally detectable in upper respiratory specimens during the acute phase of infection. The lowest concentration of SARS-CoV-2 viral copies this assay can detect is 138 copies/mL. A negative result does not preclude SARS-Cov-2 infection and should not be used as the sole basis for treatment or other patient management decisions. A negative result may occur with  improper specimen collection/handling, submission of specimen other than nasopharyngeal swab, presence of viral mutation(s) within the areas targeted by this assay, and inadequate number of viral copies(<138 copies/mL). A negative result must be combined with clinical observations, patient history, and epidemiological information. The expected result is Negative.  Fact Sheet for Patients:  07/28/21  Fact Sheet for Healthcare Providers:  BloggerCourse.com  This test is no t yet approved or cleared by the [employer]iousBroker.it FDA and  has been authorized for detection and/or diagnosis of SARS-CoV-2 by FDA under an Emergency Use Authorization (EUA). This EUA will remain  in effect (meaning this test can be used) for the duration of the COVID-19 declaration under Section 564(b)(1) of the Act, 21 U.S.C.section 360bbb-3(b)(1), unless the authorization is terminated  or revoked sooner.        Influenza A by PCR NEGATIVE NEGATIVE Final   Influenza B by PCR NEGATIVE NEGATIVE Final    Comment: (NOTE) The Xpert Xpress SARS-CoV-2/FLU/RSV plus assay is intended as an aid in the diagnosis of influenza from Nasopharyngeal swab specimens and should not be used as a sole basis for treatment. Nasal washings and aspirates are unacceptable for Xpert Xpress SARS-CoV-2/FLU/RSV testing.  Fact Sheet for Patients: [place]  Fact Sheet for Healthcare Providers: BloggerCourse.com  This test is not yet approved or cleared by the [employer]ious[employer]ker.it and has been authorized  for detection and/or diagnosis of SARS-CoV-2 by FDA under an Emergency Use Authorization (EUA). This EUA will remain in effect (meaning this test can be used) for the duration of the COVID-19 declaration under Section 564(b)(1) of the Act, 21 U.S.C. section 360bbb-3(b)(1), unless the authorization is terminated or revoked.  Performed at [facility] [place], [address] [place], [place] [id number]      Radiological Exams on Admission: CT Angio Chest PE W and/or Wo Contrast  Result Date: 05/28/2021 CLINICAL DATA:  Chest pain hyperglycemia EXAM: CT ANGIOGRAPHY CHEST WITH CONTRAST TECHNIQUE: Multidetector CT imaging of the chest was performed using the standard protocol during bolus administration of intravenous contrast. Multiplanar CT image reconstructions and MIPs were obtained to evaluate the vascular anatomy. CONTRAST:  100mL OMNIPAQUE IOHEXOL 350 MG/ML SOLN COMPARISON:  Chest x-ray 05/18/2021 FINDINGS: Cardiovascular: Satisfactory opacification of the pulmonary arteries to the segmental level. No evidence of pulmonary embolism. Normal heart size. No pericardial effusion. Nonaneurysmal aorta. No dissection is seen. Mediastinum/Nodes: No enlarged mediastinal, hilar, or axillary lymph nodes. Thyroid gland, trachea, and esophagus demonstrate no significant findings.  Lungs/Pleura: Lungs are clear. No pleural effusion or pneumothorax. Upper Abdomen: No acute abnormality. Musculoskeletal: No chest wall abnormality. No acute or significant osseous findings. Review of the MIP images confirms the above findings. IMPRESSION: Negative. No CT evidence for acute pulmonary embolus or aortic dissection. Clear lung fields Electronically Signed   By: Jasmine PangKim  Fujinaga M.D.   On: 05/28/2021 21:20    EKG: Independently reviewed. Sinus tachycardia, rate 138.   Assessment/Plan   1. DKA  - Pt with T1DM and A1c 10.9% in July presents with hyperglycemia and N/V and found to have serum glucose 619 with bicarbonate 15, AG 17, and ketonuria  - She was given IVF bolus and started on insulin infusion in ED  - Last 2 CBGs 133 and 160, plan to repeat chem panel and transition to sq insulin if bicarb and AG normal, continue IVF hydration until tolerating adequate oral intake    2. Chest pain  - 2 wks of chest pain evaluated multiple times in EDs - ACS and PE ruled out in ED this admission; clear lungs and normal mediastinum on CT in ED  - Pain is central, sharp, worse with swallowing and eating  - Trial GI cocktail and Pepcid  3. Anxiety; insomnia  - Continue hydroxyzine and Ambien as needed    4. Chronic back pain with radiculopathy  - Stable, continue gabapentin     DVT prophylaxis: Lovenox  Code Status: Full  Level of Care: Level of care: Progressive Family Communication: None present  Disposition Plan:  Patient is from: Home  Anticipated d/c is to: Home  Anticipated d/c date is: 05/30/21 Patient currently: Pending glycemic-control, ability to tolerate adequate oral intake  Consults called: none  Admission status: Inpatient     Briscoe Deutscherimothy S Peniel Biel, MD [facility]  05/29/2021, 3:02 AM

## 2021-05-29 NOTE — Evaluation (Signed)
Clinical/Bedside Swallow Evaluation Patient Details  Name: Julie [name] MRN: [id number] Date of Birth: 09-02-1991  Today's Date: 05/29/2021 Time: SLP Start Time (ACUTE ONLY): 1232 SLP Stop Time (ACUTE ONLY): 1246 SLP Time Calculation (min) (ACUTE ONLY): 14 min  Past Medical History:  Past Medical History:  Diagnosis Date   Bulimia nervosa    Dental caries    Diabetes mellitus without complication (HCC)    Genital warts    Noncompliance with diabetes treatment    Pelvic fracture (HCC)    Past Surgical History:  Past Surgical History:  Procedure Laterality Date   CESAREAN SECTION     CESAREAN SECTION  2012   HPI:  Pt is a 30 y.o. female who presented to the ED for evaluation of high blood sugar, chest pain, nausea, and vomiting. On admission, pt reported, "severe sharp central chest pain over the same interval, worse when she tries to eat or swallow anything". Per note on 9/1 from ED RN at [facility], "Patient given water for PO challenge. Patient complaining of pain when swallowing. States feels pain down esophagus and then radiating to chest. States feels like something is stuck" PMH: type 1 diabetes mellitus, anxiety, insomnia, chronic low back pain status post lumbar laminectomy.   Assessment / Plan / Recommendation Clinical Impression  Pt was seen for bedside swallow evaluation. She reported that she was recently diagnosed with COVID and has been having difficulty swallowing since 9/1. Pt stated that she was initially able to eat solids with odynophagia, but that for the past few days she has had difficulty with liquids as well. Pt reported significant chest pain which radiates laterally, and the sensation of "pins and needles" when she swallows. She stated that she has been able to swallow liquids for days due to pain and emesis. Oral mechanism exam was WNL and dentition was adequate despite reduced mandibular incisors. Trials were limited due to odynophagia which  pt rated 8-9/10 and her fear of emesis. Pt demonstrated adequate mastication of ice chips and no s/sx of aspiration were noted. Hyolaryngeal movement subjectively appeared [facility]. Pt grabbed at her chest, grimaces, and became tearful with each swallow of trace thin liquids from ice chips. SLP suspects esophageal involvement and recommends GI assessment; APP for GI arrived during the evaluation. Diet recommendations will be deferred to GI and SLP will follow pt briefly. SLP Visit Diagnosis: Dysphagia, unspecified (R13.10)    Aspiration Risk  Mild aspiration risk    Diet Recommendation  (deferred to GI)   Medication Administration: Whole meds with liquid (vs crushed with puree; per pt preference/GI's recommendations)    Other  Recommendations Recommended Consults: Consider GI evaluation;Consider esophageal assessment Oral Care Recommendations: Oral care BID   Follow up Recommendations None      Frequency and Duration min 2x/week  1 week       Prognosis        Swallow Study   General Date of Onset: 05/21/21 HPI: Pt is a 30 y.o. female who presented to the ED for evaluation of high blood sugar, chest pain, nausea, and vomiting. On admission, pt reported, "severe sharp central chest pain over the same interval, worse when she tries to eat or swallow anything". Per note on 9/1 from ED RN at [facility], "Patient given water for PO challenge. Patient complaining of pain when swallowing. States feels pain down esophagus and then radiating to chest. States feels like something is stuck" PMH: type 1 diabetes mellitus, anxiety, insomnia, chronic  low back pain status post lumbar laminectomy. Type of Study: Bedside Swallow Evaluation Previous Swallow Assessment: none stated Diet Prior to this Study: Regular;Thin liquids Temperature Spikes Noted: No Respiratory Status: Room air History of Recent Intubation: No Behavior/Cognition: Alert;Cooperative (anxious) Oral Cavity Assessment: Within  Functional Limits Oral Care Completed by SLP: No Oral Cavity - Dentition: Missing dentition;Adequate natural dentition Vision: Functional for self-feeding Self-Feeding Abilities: Needs assist Patient Positioning: Upright in bed;Postural control adequate for testing Baseline Vocal Quality: Normal Volitional Cough: Strong Volitional Swallow: Able to elicit    Oral/Motor/Sensory Function Overall Oral Motor/Sensory Function: Within functional limits   Ice Chips Ice chips: Impaired (odynophagia; pt hugging chest, grimacing, during deglutition and becoming tearful)  Thin Liquid      Nectar Thick Nectar Thick Liquid: Not tested   Honey Thick Honey Thick Liquid: Not tested   Puree Puree: Not tested (Pt requested that it be deferred due to pain)   Solid     Solid: Not tested (Pt requested that it be deferred due to pain)     Kerigan Narvaez I. Vear Clock, MS, CCC-SLP Acute Rehabilitation Services Office number [phone] Pager [phone]  Scheryl Marten 05/29/2021,2:10 PM

## 2021-05-29 NOTE — Progress Notes (Signed)
Patient has recently lost her father and her aunt to cancer and has another family under treatment for cancer. She worries the pain in her throat is cancer and that she will not be taken seriously.

## 2021-05-29 NOTE — Progress Notes (Signed)
PROGRESS NOTE    Julie [name]  OEU:[id number] DOB: 1991/05/14 DOA: 05/28/2021 PCP: [facility], [facility]  Brief Narrative:  The patient is a 30 year old Hispanic female with a past medical history significant for but not limited to type 1 diabetes mellitus on insulin pump which recently broke, anxiety, insomnia, chronic low back pain status post lumbar laminectomy as well as other comorbidities who presented to the ED for evaluation of high blood sugar, chest pain and nausea vomiting.  She reports that she been taking 15 units of Levemir every night and 6 units in the low 3 times daily but her glucometer has been reading "high" last 2 weeks.  She is also experiencing severe sharp central chest pain over the same interval worse if she tries to eat or swallow anything.  She had 2 episodes of blood-tinged sputum roughly a week ago and she has had a some shortness of breath with minimal cough.  She reports that she has been unable to eat the last week due to her chest pain nausea vomiting and feels like her food gets stuck.  She denies any lower extremity swelling or tenderness.  Denies abdominal pain or diarrhea.  On arrival to the hospital she was tachycardic into the 130s.  CTA chest was negative for PE, acute cardiopulmonary disease or acute mediastinal findings.  Her initial blood sugar was 619 and bicarb was 15.  High-sensitivity troponins were undetectable and lipase was normal.  Urinalysis was done and did show glucosuria and ketonuria.  She is given 1-1/2 L of LR and 20" IV potassium and pain medication with IV Dilaudid and started on insulin infusion and transferred to [facility] for evaluation management of DKA and intractable nausea vomiting.  Because she had issues with her swallowing GI was consulted and they are planning an EGD.   Assessment & Plan:   Principal Problem:   DKA, type 1 (HCC) Active Problems:   Depression with anxiety   Chest pain  DKA in the setting of type 1  diabetes mellitus -She is type 1 diabetes mellitus and her last hemoglobin A1c was 10.9 in July -She presented with hyperglycemia and intractable nausea vomiting and found to have serum glucose is significant with a bicarb of 15 and anion gap of 17 with acute nausea -She is given IV fluid hydration in the ED and boluses and started on insulin infusion -This morning she had been weaned off of the insulin transition to subcu given that her bicarb is now normal x2 -Unfortunately she is still not tolerating oral diet given that she had intractable nausea vomiting and likely she does have gastroparesis in the setting of her uncontrolled diabetes -We will continue supportive care with antiemetics and she states that her Compazine gives her panic attacks and that Zofran is ineffective; she did receive Phenergan suppository.  Try for scopolamine patch -Given her intractable nausea vomiting we have consulted GI -CBGs have been proved slightly and have now been ranging from 140-219 -Continue monitor blood sugars carefully and adjust insulin as needed -We have increased her Levemir from 10 units daily to twice daily and added 4 units of NovoLog with meals and also add a sliding scale -Currently getting D5 and LR at 125 MLS per hour given that she continues to be nauseous and vomiting  Dysphagia and odynophagia -Has been recently evaluated in the ED multiple times for chest pain which she attributes to food getting stuck -Started after COVID and she was prescribed antibiotic and steroid inhaler -  Troponins have been flat -GI has been consulted and they suspect esophageal candidiasis as well as a component of gastroparesis -SLP to evaluate bedside -GI recommending IV PPI twice daily and famotidine -Continue with antiemetics -Patient is to undergo an EGD to rule out erosive esophagitis versus candidiasis esophagitis and PUD -She will need eventual gastric emptying study as an outpatient to rule out diabetic  gastroparesis -We will give her scopolamine patch and initiate IV Reglan 5 mg -Appreciate specialist recommendations  Chest pain -She has had 2 weeks of chest pain that has been evaluated multiple times in the ED -Troponins been flat -She had a CTA done and lungs are clear and normal mediastinum on CTA and the pain is central and sharp and radiates to the sides and to the back and is worse with swallowing and 8 -GI cocktail and Pepcid were trialed but she is now on IV PPI -Gastroenterology evaluating and planning for EGD  Anxiety insomnia -Continue with hydroxyzine and zolpidem as needed  Chronic back pain with radiculopathy -Underwent a laminectomy and will continue her home gabapentin  Hypophosphatemia -Mild with phosphorus level 2.3 -Replete with p.o. K-Phos Neutral 500 mg p.o. twice daily x2 dose -Continue monitor and replete as necessary -Repeat phosphorus level in a.m.  Hyponatremia -Mild and in the setting of hyperglycemia -Continue monitor and trend and repeat CMP this evening  Tobacco abuse -Smoking cessation counseling given   DVT prophylaxis: Enoxaparin 40 mg subcu every 24 Code Status: FULL CODE  Family Communication: No family present at bedside Disposition Plan: Pending further clinical improvement and evaluation by gastroenterology  Status is: Inpatient  Remains inpatient appropriate because:Unsafe d/c plan, IV treatments appropriate due to intensity of illness or inability to take PO, and Inpatient level of care appropriate due to severity of illness  Dispo: The patient is from: Home              Anticipated d/c is to: Home              Patient currently is not medically stable to d/c.   Difficult to place patient No  Consultants:  Gastroenterology  Procedures: None  Antimicrobials:  Anti-infectives (From admission, onward)    None        Subjective: Seen and examined at bedside and still was very nauseous and vomited right before her  Phenergan came.  Does not feel like eating but continues to have complain of some midsternal chest pain that radiates to the sides into her back is worse with eating.  Because of her symptoms and because they are persisting will consult GI for further evaluation and she is to undergo an EGD at some point.  She denies any other concerns or complaints and denies any abdominal pain.   Objective: Vitals:   05/29/21 0100 05/29/21 0409 05/29/21 0800 05/29/21 1700  BP: 121/87 (!) 132/57 126/74 139/70  Pulse: 95 80    Resp:  12  16  Temp: 98.1 F (36.7 C) 98.9 F (37.2 C)    TempSrc:  Oral    SpO2: 100% 100%  99%  Weight:      Height:        Intake/Output Summary (Last 24 hours) at 05/29/2021 1725 Last data filed at 05/29/2021 1500 Gross per 24 hour  Intake 3727.25 ml  Output --  Net 3727.25 ml   Filed Weights   05/28/21 1858  Weight: 74.8 kg   Examination: Physical Exam:  Constitutional: WN/WD Hispanic female currently in mild distress appears  anxious and uncomfortable  eyes: Lids and conjunctivae normal, sclerae anicteric  ENMT: External Ears, Nose appear normal. Grossly normal hearing. Mucous membranes are a little dry Neck: Appears normal, supple, no cervical masses, normal ROM, no appreciable thyromegaly; no JVD Respiratory: Diminished to auscultation bilaterally, no wheezing, rales, rhonchi or crackles. Normal respiratory effort and patient is not tachypenic. No accessory muscle use.  Unlabored breathing Cardiovascular: RRR, no murmurs / rubs / gallops. S1 and S2 auscultated.  Trace extremity edema Abdomen: Soft, non-tender, non-distended. Bowel sounds positive.  GU: Deferred. Musculoskeletal: No clubbing / cyanosis of digits/nails. No joint deformity upper and lower extremities.  Skin: No rashes, lesions, ulcers on limited skin evaluation. No induration; Warm and dry.  Neurologic: CN 2-12 grossly intact with no focal deficits. Romberg sign and cerebellar reflexes not assessed.   Psychiatric: Normal judgment and insight. Alert and oriented x 3.  Anxious mood  Data Reviewed: I have personally reviewed following labs and imaging studies  CBC: Recent Labs  Lab 05/28/21 1900 05/28/21 1921 05/29/21 0327 05/29/21 1018  WBC 9.7  --  8.6 6.4  NEUTROABS 6.1  --   --  2.5  HGB 15.8* 16.7* 13.3 13.1  HCT 47.2* 49.0* 39.3 38.5  MCV 85.7  --  85.6 84.8  PLT 277  --  237 216   Basic Metabolic Panel: Recent Labs  Lab 05/28/21 1921 05/28/21 1937 05/29/21 0303 05/29/21 0327 05/29/21 1018  NA 122* 124* 133*  --  132*  K 4.3 4.7 3.4*  --  3.7  CL  --  92* 102  --  104  CO2  --  15* 22  --  22  GLUCOSE  --  619* 152*  --  178*  BUN  --  14 7  --  <5*  CREATININE  --  0.80 0.38*  --  0.35*  CALCIUM  --  8.7* 9.0  --  8.2*  MG  --   --   --  1.5* 1.8  PHOS  --   --   --   --  2.3*   GFR: Estimated Creatinine Clearance: 114.9 mL/min (A) (by C-G formula based on SCr of 0.35 mg/dL (L)). Liver Function Tests: Recent Labs  Lab 05/29/21 1018  AST 15  ALT 11  ALKPHOS 72  BILITOT 0.7  PROT 5.6*  ALBUMIN 2.8*   Recent Labs  Lab 05/28/21 1937  LIPASE 19   No results for input(s): AMMONIA in the last 168 hours. Coagulation Profile: No results for input(s): INR, PROTIME in the last 168 hours. Cardiac Enzymes: No results for input(s): CKTOTAL, CKMB, CKMBINDEX, TROPONINI in the last 168 hours. BNP (last 3 results) No results for input(s): PROBNP in the last 8760 hours. HbA1C: Recent Labs    05/29/21 0303  HGBA1C 11.7*   CBG: Recent Labs  Lab 05/29/21 0653 05/29/21 0759 05/29/21 1007 05/29/21 1124 05/29/21 1615  GLUCAP 140* 163* 176* 219* 221*   Lipid Profile: No results for input(s): CHOL, HDL, LDLCALC, TRIG, CHOLHDL, LDLDIRECT in the last 72 hours. Thyroid Function Tests: No results for input(s): TSH, T4TOTAL, FREET4, T3FREE, THYROIDAB in the last 72 hours. Anemia Panel: No results for input(s): VITAMINB12, FOLATE, FERRITIN, TIBC, IRON,  RETICCTPCT in the last 72 hours. Sepsis Labs: No results for input(s): PROCALCITON, LATICACIDVEN in the last 168 hours.  Recent Results (from the past 240 hour(s))  Resp Panel by RT-PCR (Flu A&B, Covid) Nasopharyngeal Swab     Status: None   Collection Time: 05/28/21  7:37 PM   Specimen: Nasopharyngeal Swab; Nasopharyngeal(NP) swabs in vial transport medium  Result Value Ref Range Status   SARS Coronavirus 2 by RT PCR NEGATIVE NEGATIVE Final    Comment: (NOTE) SARS-CoV-2 target nucleic acids are NOT DETECTED.  The SARS-CoV-2 RNA is generally detectable in upper respiratory specimens during the acute phase of infection. The lowest concentration of SARS-CoV-2 viral copies this assay can detect is 138 copies/mL. A negative result does not preclude SARS-Cov-2 infection and should not be used as the sole basis for treatment or other patient management decisions. A negative result may occur with  improper specimen collection/handling, submission of specimen other than nasopharyngeal swab, presence of viral mutation(s) within the areas targeted by this assay, and inadequate number of viral copies(<138 copies/mL). A negative result must be combined with clinical observations, patient history, and epidemiological information. The expected result is Negative.  Fact Sheet for Patients:  BloggerCourse.comhttps://www.fda.gov/media/152166/download  Fact Sheet for Healthcare Providers:  SeriousBroker.ithttps://www.fda.gov/media/152162/download  This test is no t yet approved or cleared by the [place] FDA and  has been authorized for detection and/or diagnosis of SARS-CoV-2 by FDA under an Emergency Use Authorization (EUA). This EUA will remain  in effect (meaning this test can be used) for the duration of the COVID-19 declaration under Section 564(b)(1) of the Act, 21 U.S.C.section 360bbb-3(b)(1), unless the authorization is terminated  or revoked sooner.       Influenza A by PCR NEGATIVE NEGATIVE Final   Influenza  B by PCR NEGATIVE NEGATIVE Final    Comment: (NOTE) The Xpert Xpress SARS-CoV-2/FLU/RSV plus assay is intended as an aid in the diagnosis of influenza from Nasopharyngeal swab specimens and should not be used as a sole basis for treatment. Nasal washings and aspirates are unacceptable for Xpert Xpress SARS-CoV-2/FLU/RSV testing.  Fact Sheet for Patients: BloggerCourse.comhttps://www.fda.gov/media/152166/download  Fact Sheet for Healthcare Providers: SeriousBroker.ithttps://www.fda.gov/media/152162/download  This test is not yet approved or cleared by the [place] FDA and has been authorized for detection and/or diagnosis of SARS-CoV-2 by FDA under an Emergency Use Authorization (EUA). This EUA will remain in effect (meaning this test can be used) for the duration of the COVID-19 declaration under Section 564(b)(1) of the Act, 21 U.S.C. section 360bbb-3(b)(1), unless the authorization is terminated or revoked.  Performed at [facility] [place], [address] [place], [place] [id number]     RN Pressure Injury Documentation:     Estimated body mass index is 23.01 kg/m as calculated from the following:   Height as of this encounter: 5\' 11"  (1.803 m).   Weight as of this encounter: 74.8 kg.  Malnutrition Type:   Malnutrition Characteristics:   Nutrition Interventions:   Radiology Studies: CT Angio Chest PE W and/or Wo Contrast  Result Date: 05/28/2021 CLINICAL DATA:  Chest pain hyperglycemia EXAM: CT ANGIOGRAPHY CHEST WITH CONTRAST TECHNIQUE: Multidetector CT imaging of the chest was performed using the standard protocol during bolus administration of intravenous contrast. Multiplanar CT image reconstructions and MIPs were obtained to evaluate the vascular anatomy. CONTRAST:  100mL OMNIPAQUE IOHEXOL 350 MG/ML SOLN COMPARISON:  Chest x-ray 05/18/2021 FINDINGS: Cardiovascular: Satisfactory opacification of the pulmonary arteries to the segmental level. No evidence of pulmonary embolism. Normal heart  size. No pericardial effusion. Nonaneurysmal aorta. No dissection is seen. Mediastinum/Nodes: No enlarged mediastinal, hilar, or axillary lymph nodes. Thyroid gland, trachea, and esophagus demonstrate no significant findings. Lungs/Pleura: Lungs are clear. No pleural effusion or pneumothorax. Upper Abdomen: No acute abnormality. Musculoskeletal: No chest wall abnormality. No  acute or significant osseous findings. Review of the MIP images confirms the above findings. IMPRESSION: Negative. No CT evidence for acute pulmonary embolus or aortic dissection. Clear lung fields Electronically Signed   By: Jasmine Pang M.D.   On: 05/28/2021 21:20    Scheduled Meds:  enoxaparin (LOVENOX) injection  40 mg Subcutaneous Daily   famotidine (PEPCID) IV  20 mg Intravenous Q12H   gabapentin  300 mg Oral TID   insulin aspart  0-9 Units Subcutaneous TID WC   insulin aspart  4 Units Subcutaneous TID WC   insulin detemir  10 Units Subcutaneous Q24H   metoCLOPramide (REGLAN) injection  5 mg Intravenous Q8H   scopolamine  1 patch Transdermal Q72H   Continuous Infusions:  dextrose 5% lactated ringers 125 mL/hr at 05/29/21 1702   insulin Stopped (05/29/21 1117)   promethazine (PHENERGAN) injection (IM or IVPB) 6.25 mg (05/29/21 1406)    LOS: 0 days   Merlene Laughter, DO [facility] PAGER is on AMION  If 7PM-7AM, please contact night-coverage [email or web address]

## 2021-05-29 NOTE — Consult Note (Addendum)
Referring Provider:   Dr. Raiford Noble Primary Care Physician:  [facility] Primary Gastroenterologist: Althia Forts   Reason for Consultation: Dysphagia  HPI: Julie [name] is a 30 y.o. female with a past medical history of diabetes mellitus type 1, bulimia 10 years ago, COVID 28 infection 04/2021 and lower back pain status post lumbar laminectomy.  She was diagnosed with Covid 19 about one month ago, she reported receiving an antibiotic and steroid inhaler per her PCP.  She developed chest pain since being diagnosed with Covid 19. She presented to [facility] ED on 05/17/2021. Labs showed WBC 9.6. Hg 14.2. Na 127. K 4.6. Alk phos 122. T.bili 0.33. AST 19. ALT 14. Ano evidence of AKA. A chest xray was negative. Chest CTA without evidence of a PE or infiltrate/pleural effusion. She received IV fluids, antiemetic, Toradol and Morphine and her symptoms improved so she was discharged home.   She presented to [place] ED on 05/18/2021 with complaints of midsternal chest pain, pain when swallowing solid food and liquids. She coughed up mucous with a small amount of blood x 2. A chest xray was negative. Since she had a negative chest CTA done 05/17/2021 there was low suspicion for a PE and further imaging was not done. NA 133. Glu 308. She received IV fluids, Ativan, antiemetics and a GI cocktail and hers symptoms improved and she was discharged home.   She presented to [place] ED on 05/21/2021 with chest pain and hyperglycemia.  In the ED days prior with a negative evaluation.  A twelve-lead EKG showed sinus tachycardia without evidence of acute ischemia.  Normal troponin levels x2. She received antiemetics, analgesics, Pepcid and Toradol and her symptoms improved.  Abdominal x-ray was negative.  She was discharged home with instructions to follow-up with her PCP.  She returned to [place] ED on 05/28/2021 with uncontrolled hyperglycemia.  She reported her  insulin pump broke 3 months ago and she has been managing her diabetes with insulin injections since then.  She continued to have chest pain which radiated to her back associated with epigastric pain and nausea/vomiting.  Labs in the ED showed a sodium level of 124.  Potassium 4.7.  Chloride 92.  Glucose 619.  BUN 14.  Creatinine 0.80.  Calcium 8.7.  Anion gap 17.  Lipase 19.  WBC 9.7.  Hemoglobin 15.8.  Hematocrit 47.2.  Platelet 277. She was transferred to [facility] for admission and further evaluation.   Laboratory studies 05/29/2021: Sodium 132.  Potassium 3.7.  Glucose 178.  BUN less than 5.  Creatinine 0.35.  Magnesium 2.8.  Alk phos 72.  AST 15.  ALT 11.  Total bili 0.7.  She describes having odynophagia, drinking any water or swallowing any solid food results in mid esophageal pain. Food feels likes it gets stuck with significant esophageal pain. She presses on her chest and in a minute or two the pain eases and the food or liquid passes down the esophagus. She has nausea and vomits up nonbloody clears to bilious emesis once or twice daily for the past few weeks but not every day.  No hematemesis. No NSAID use. No alcohol use. No drug use. Past history of bulimia, denies inducting vomiting at this time. She typically passes a normal brown stool daily but had nonbloody diarrhea for 4 to 5 days then no BM for the past 2 days. She has 4 young children. Stress level is high. Denies chance of pregnancy, has an IUD.  Chest CTA 05/17/2021: Pulmonary arteries: No pulmonary emboli. Thoracic aorta: No aneurysm or dissection. Mediastinal and hilar structures: No mass or adenopathy. Lungs: No significant abnormality. Bony structures: Unremarkable Visualized upper abdomen: Unremarkable Impression:  IMPRESSION: No vascular filling defect to suggest pulmonary emboli.  No acute infiltrate or pleural effusion.   Past Medical History:  Diagnosis Date   Bulimia nervosa    Dental caries    Diabetes mellitus  without complication ([place])    Genital warts    Noncompliance with diabetes treatment    Pelvic fracture [facility])     Past Surgical History:  Procedure Laterality Date   CESAREAN SECTION     CESAREAN SECTION  2012    Prior to Admission medications   Medication Sig Start Date End Date Taking? Authorizing Provider  gabapentin (NEURONTIN) 800 MG tablet Take 800 mg by mouth in the morning, at noon, in the evening, and at bedtime. 05/22/19  Yes [provider]  hydrOXYzine (ATARAX/VISTARIL) 25 MG tablet Take 12.5-25 mg by mouth every 8 (eight) hours as needed for anxiety. 01/10/21  Yes [provider]  naloxone (NARCAN) nasal spray 4 mg/0.1 mL Place 1 spray into the nose as needed. 03/02/21  Yes [provider]  promethazine (PHENERGAN) 25 MG tablet Take 25 mg by mouth every 8 (eight) hours as needed for nausea or vomiting. 04/18/20  Yes [provider]  busPIRone (BUSPAR) 15 MG tablet Take 15 mg by mouth 3 (three) times daily as needed for anxiety. 05/11/21   [provider]  clonazePAM (KLONOPIN) 0.5 MG tablet Take 0.5-1 mg by mouth 2 (two) times daily as needed. 08/31/20   [provider]  ergocalciferol (VITAMIN D2) 1.25 MG (50000 UT) capsule Take 50,000 Units by mouth once a week. 10/09/19   [provider]  famotidine (PEPCID) 20 MG tablet Take 1 tablet (20 mg total) by mouth 2 (two) times daily. 05/21/21   Khatri, Hina, PA-C  insulin aspart (NOVOLOG FLEXPEN) 100 UNIT/ML FlexPen Inject 12 Units into the skin 3 (three) times daily with meals. 04/10/17   Davonna Belling, MD  insulin detemir (LEVEMIR) 100 UNIT/ML injection Inject 35 Units into the skin daily.    [provider]  insulin glargine (LANTUS) 100 UNIT/ML injection Inject 55 Units into the skin at bedtime.    [provider]  Insulin Pen Needle (NOVOFINE) 30G X 8 MM MISC Inject 10 each into the skin as needed. 07/27/13   Samella Parr, NP  oxyCODONE (OXY  IR/ROXICODONE) 5 MG immediate release tablet Take 10 mg by mouth every 8 (eight) hours as needed for pain. 05/23/21   [provider]  promethazine-dextromethorphan (PROMETHAZINE-DM) 6.25-15 MG/5ML syrup Take 1.3 mLs by mouth 4 (four) times daily as needed for cough. 05/18/21   Horton, Drue Dun M, DO  QUEtiapine (SEROQUEL) 50 MG tablet Take 50 mg by mouth at bedtime as needed for sleep. 05/11/21   [provider]  sucralfate (CARAFATE) 1 g tablet Take 1 tablet (1 g total) by mouth 4 (four) times daily -  with meals and at bedtime. 05/21/21   Khatri, Nicanor Alcon, PA-C  SYMBICORT 160-4.5 MCG/ACT inhaler Inhale 2 puffs into the lungs in the morning and at bedtime. 04/21/21   [provider]  traMADol (ULTRAM) 50 MG tablet Take 50 mg by mouth 4 (four) times daily as needed for moderate pain. 05/13/19   [provider]  VYVANSE 40 MG capsule Take 40 mg by mouth daily. 05/24/21   [provider]  zolpidem (AMBIEN) 10 MG tablet Take 10 mg by mouth at bedtime as needed for sleep. 05/24/21   [provider]    Current Facility-Administered Medications  Medication Dose Route Frequency Provider Last Rate Last Admin   acetaminophen (TYLENOL) tablet 650 mg  650 mg Oral Q6H PRN Opyd, Ilene Qua, MD   650 mg at 05/29/21 1112   Or   acetaminophen (TYLENOL) suppository 650 mg  650 mg Rectal Q6H PRN Opyd, Ilene Qua, MD       dextrose 5 % in lactated ringers infusion   Intravenous Continuous Opyd, Ilene Qua, MD 125 mL/hr at 05/29/21 0727 New Bag at 05/29/21 0727   dextrose 50 % solution 0-50 mL  0-50 mL Intravenous PRN Opyd, Ilene Qua, MD       enoxaparin (LOVENOX) injection 40 mg  40 mg Subcutaneous Daily Opyd, Ilene Qua, MD   40 mg at 05/29/21 1100   famotidine (PEPCID) IVPB 20 mg in NS 100 mL IVPB  20 mg Intravenous Q12H Opyd, Ilene Qua, MD 200 mL/hr at 05/29/21 0544 20 mg at 05/29/21 0544   gabapentin (NEURONTIN) capsule 300 mg  300 mg Oral TID Vianne Bulls, MD   300 mg at  05/29/21 1100   HYDROmorphone (DILAUDID) injection 1 mg  1 mg Intravenous Q3H PRN Sheikh, Omair Latif, DO       hydrOXYzine (ATARAX/VISTARIL) tablet 25 mg  25 mg Oral TID PRN Opyd, Ilene Qua, MD       insulin aspart (novoLOG) injection 0-9 Units  0-9 Units Subcutaneous TID WC Opyd, Ilene Qua, MD       insulin aspart (novoLOG) injection 4 Units  4 Units Subcutaneous TID WC Opyd, Ilene Qua, MD       insulin detemir (LEVEMIR) injection 10 Units  10 Units Subcutaneous Q24H Opyd, Ilene Qua, MD   10 Units at 05/29/21 0733   insulin regular, human (MYXREDLIN) 100 units/ 100 mL infusion   Intravenous Continuous Opyd, Ilene Qua, MD   Stopped at 05/29/21 1117   ketorolac (TORADOL) 30 MG/ML injection 30 mg  30 mg Intravenous Q6H PRN Opyd, Ilene Qua, MD       magnesium sulfate IVPB 2 g 50 mL  2 g Intravenous Once Sheikh, Omair Latif, DO       metoCLOPramide (REGLAN) injection 5 mg  5 mg Intravenous Q8H Sheikh, Omair Latif, DO       promethazine (PHENERGAN) tablet 12.5 mg  12.5 mg Oral Q6H PRN Opyd, Ilene Qua, MD       Or   promethazine (PHENERGAN) 6.25 mg in sodium chloride 0.9 % 50 mL IVPB  6.25 mg Intravenous Q6H PRN Opyd, Ilene Qua, MD       Or   promethazine (PHENERGAN) suppository 12.5 mg  12.5 mg Rectal Q6H PRN Opyd, Ilene Qua, MD   12.5 mg at 05/29/21 0551   scopolamine (TRANSDERM-SCOP) 1 MG/3DAYS 1.5 mg  1 patch Transdermal Q72H Sheikh, Omair Latif, DO       senna-docusate (Senokot-S) tablet 1 tablet  1 tablet Oral QHS PRN Opyd, Ilene Qua, MD       zolpidem (AMBIEN) tablet 5 mg  5 mg Oral QHS PRN Opyd, Ilene Qua, MD        Allergies as of 05/28/2021   (No Known Allergies)    Family History  Problem Relation Age of Onset   Diabetes Mother     Social History   Socioeconomic History   Marital status: Legally Separated  Spouse name: Not on file   Number of children: Not on file   Years of education: Not on file   Highest education level: Not on file  Occupational History   Not on file   Tobacco Use   Smoking status: Every Day    Types: Cigarettes   Smokeless tobacco: Never  Vaping Use   Vaping Use: Never used  Substance and Sexual Activity   Alcohol use: No   Drug use: Not Currently   Sexual activity: Not on file  Other Topics Concern   Not on file  Social History Narrative   Not on file   Social Determinants of Health   Financial Resource Strain: Not on file  Food Insecurity: Not on file  Transportation Needs: Not on file  Physical Activity: Not on file  Stress: Not on file  Social Connections: Not on file  Intimate Partner Violence: Not on file    Review of Systems: See HPI, all other systems reviewed and are negative.    Physical Exam: Vital signs in last 24 hours: Temp:  [98.1 F (36.7 C)-99.1 F (37.3 C)] 98.9 F (37.2 C) (09/09 0409) Pulse Rate:  [80-139] 80 (09/09 0409) Resp:  [12-26] 12 (09/09 0409) BP: (115-143)/(57-110) 126/74 (09/09 0800) SpO2:  [98 %-100 %] 100 % (09/09 0409) Weight:  [74.8 kg] 74.8 kg (09/08 1858) Last BM Date: 05/28/21 General:  Tearful 30 year old female in NAD. Head:  Normocephalic and atraumatic. Eyes:  No scleral icterus. Conjunctiva pink. Ears:  Normal auditory acuity. Nose:  No deformity, discharge or lesions. Mouth:  Dentition intact. No ulcers or lesions.  Neck:  Supple. No lymphadenopathy or thyromegaly.  Lungs:  Clear throughout.  Heart:  RRR, no murmur.  Chest : Mild tenderness to percussion.  Abdomen:  Soft, epigastric tenderness without rebound or guarding.  Rectal: Deferred. Musculoskeletal:  Symmetrical without gross deformities.  Pulses:  Normal pulses noted. Extremities:  Without clubbing or edema. Neurologic:  Alert and  oriented x4. No focal deficits.  Skin:  Intact without significant lesions or rashes. Psych:  Alert and cooperative. Normal mood and affect.  Intake/Output from previous day: 09/08 0701 - 09/09 0700 In: 2527.3 [I.V.:1219.4; IV Piggyback:1307.9] Out: -  Intake/Output  this shift: No intake/output data recorded.  Lab Results: Recent Labs    05/28/21 1900 05/28/21 1921 05/29/21 0327 05/29/21 1018  WBC 9.7  --  8.6 6.4  HGB 15.8* 16.7* 13.3 13.1  HCT 47.2* 49.0* 39.3 38.5  PLT 277  --  237 216   BMET Recent Labs    05/28/21 1937 05/29/21 0303 05/29/21 1018  NA 124* 133* 132*  K 4.7 3.4* 3.7  CL 92* 102 104  CO2 15* 22 22  GLUCOSE 619* 152* 178*  BUN 14 7 <5*  CREATININE 0.80 0.38* 0.35*  CALCIUM 8.7* 9.0 8.2*   LFT Recent Labs    05/29/21 1018  PROT 5.6*  ALBUMIN 2.8*  AST 15  ALT 11  ALKPHOS 72  BILITOT 0.7   PT/INR No results for input(s): LABPROT, INR in the last 72 hours. Hepatitis Panel No results for input(s): HEPBSAG, HCVAB, HEPAIGM, HEPBIGM in the last 72 hours.    Studies/Results: CT Angio Chest PE W and/or Wo Contrast  Result Date: 05/28/2021 CLINICAL DATA:  Chest pain hyperglycemia EXAM: CT ANGIOGRAPHY CHEST WITH CONTRAST TECHNIQUE: Multidetector CT imaging of the chest was performed using the standard protocol during bolus administration of intravenous contrast. Multiplanar CT image reconstructions and MIPs were obtained to  evaluate the vascular anatomy. CONTRAST:  150m OMNIPAQUE IOHEXOL 350 MG/ML SOLN COMPARISON:  Chest x-ray 05/18/2021 FINDINGS: Cardiovascular: Satisfactory opacification of the pulmonary arteries to the segmental level. No evidence of pulmonary embolism. Normal heart size. No pericardial effusion. Nonaneurysmal aorta. No dissection is seen. Mediastinum/Nodes: No enlarged mediastinal, hilar, or axillary lymph nodes. Thyroid gland, trachea, and esophagus demonstrate no significant findings. Lungs/Pleura: Lungs are clear. No pleural effusion or pneumothorax. Upper Abdomen: No acute abnormality. Musculoskeletal: No chest wall abnormality. No acute or significant osseous findings. Review of the MIP images confirms the above findings. IMPRESSION: Negative. No CT evidence for acute pulmonary embolus or aortic  dissection. Clear lung fields Electronically Signed   By: KDonavan FoilM.D.   On: 05/28/2021 21:20    IMPRESSION/PLAN: 127 30year old female with poorly controlled DM with recurrent  N/V, chest pain, epigastric pain, dysphagia and odynophagia evaluated in the ED 4 times within the past month which all started after she had Covid and was prescribed an antibiotic and a steroid inhaler. I suspect she has esophageal candidiasis as well as a component of gastroparesis. SLP at bed side to attempt swallow study, limited results as the patient experienced odynophagia with ice chips.  -PPI IV BID -Clear liquid diet -Zofran 431mIV or po Q 6 hrs PRN -EGD to rule out erosive esophagitis vs candidiasis esophagitis and PUD, timing to be verified by Dr. GuLyndel Safe-CBC and BMP in am -Eventual gastric empty study as out patient to rule out diabetic  gastroparesis  -IV fluids and pain management per the hospitalist  -Follow up with endocrinologist regarding poorly controlled diabetes which is contributing to her recurrent GI symptoms   2) Hyponatremia, improving    CoNoralyn Pick9/05/2021, 12:25 PM    Attending physician's note   I have taken an interval history, reviewed the chart and examined the patient. I agree with the Advanced Practitioner's note, impression and recommendations.   Recurrent N/V with epi pain, atypical chest pain, dysphagia/odynophagia with ?hemopytsis. Nl lipase. Neg CTA chest Recent Covid-19 (treated with A/Bs, steroids) Poorly controlled DM1 with DKA (HBA1c > 11), off insulin pump  Plan: -IV protonix -IV reglan/phenergan/zofran PRN -EGD in AM -Monitor and correct electrolytes. Keep K>4, Mg>2 -Best possible control of DM -Trend CBC, CMP -GES as outpt.   RaCarmell AustriaMD LeVelora HecklerI [phone]

## 2021-05-29 NOTE — Progress Notes (Signed)
Inpatient Diabetes Program Recommendations  AACE/ADA: New Consensus Statement on Inpatient Glycemic Control (2015)  Target Ranges:  Prepandial:   less than 140 mg/dL      Peak postprandial:   less than 180 mg/dL (1-2 hours)      Critically ill patients:  140 - 180 mg/dL   Lab Results  Component Value Date   GLUCAP 176 (H) 05/29/2021   HGBA1C 11.7 (H) 05/29/2021    Review of Glycemic Control Results for [name], [name] (MRN [id number]) as of 05/29/2021 11:08  Ref. Range 05/29/2021 04:20 05/29/2021 04:57 05/29/2021 06:53 05/29/2021 07:59 05/29/2021 10:07  Glucose-Capillary Latest Ref Range: 70 - 99 mg/dL 119 (H) 147 (H) 829 (H) 163 (H) 176 (H)   Diabetes history: DM 1 Outpatient Diabetes medications:  Levemir 35 units daily, Novolog 6 units tid with meals Current orders for Inpatient glycemic control:  Transitioning off IV insulin to Levemir 10 units daily, Novolog 4 units tid with meals, Novolog sensitive tid with meals Inpatient Diabetes Program Recommendations:    Spoke with patient regarding DM and control of blood sugars.  She states that she had COVID last month and has had a difficult time controlling blood sugars since this time likely due to steroids.  She has used insulin pump in the past (Omnipod) but states that the PDA broke.  She has been working with her endocrinologist to get a new PDA but has not been able too. Recommended that she call the 1-800 number on the insulin pump/PDA to see if it is still under warranty.   She does use Dexcom sensor as well.  She states that she  has a referral to [facility] endocrinology as well.    Consider increasing Levemir to 10 units bid.  May need meal coverage held or reduced if intake is not >50% of meal tray.  Will follow.  Patient has supplies for sensor and insulin at home.  Needs f/u with endocrinology ASAP to f/u on insulin pump.    Thanks,  Beryl Meager, RN, BC-ADM Inpatient Diabetes Coordinator Pager [phone]  (8a-5p)

## 2021-05-30 ENCOUNTER — Inpatient Hospital Stay (HOSPITAL_COMMUNITY): Payer: Medicaid Other

## 2021-05-30 ENCOUNTER — Inpatient Hospital Stay (HOSPITAL_COMMUNITY): Payer: Medicaid Other | Admitting: Certified Registered Nurse Anesthetist

## 2021-05-30 ENCOUNTER — Encounter (HOSPITAL_COMMUNITY): Payer: Self-pay | Admitting: Family Medicine

## 2021-05-30 ENCOUNTER — Encounter (HOSPITAL_COMMUNITY)
Admission: EM | Disposition: A | Discharge status: Home Health | Payer: Self-pay | Source: Home / Self Care | Attending: Internal Medicine

## 2021-05-30 DIAGNOSIS — R1013 Epigastric pain: Secondary | ICD-10-CM

## 2021-05-30 DIAGNOSIS — R0789 Other chest pain: Secondary | ICD-10-CM | POA: Diagnosis not present

## 2021-05-30 DIAGNOSIS — R112 Nausea with vomiting, unspecified: Secondary | ICD-10-CM

## 2021-05-30 DIAGNOSIS — F418 Other specified anxiety disorders: Secondary | ICD-10-CM | POA: Diagnosis not present

## 2021-05-30 DIAGNOSIS — K2970 Gastritis, unspecified, without bleeding: Secondary | ICD-10-CM

## 2021-05-30 DIAGNOSIS — R296 Repeated falls: Secondary | ICD-10-CM

## 2021-05-30 DIAGNOSIS — E1010 Type 1 diabetes mellitus with ketoacidosis without coma: Secondary | ICD-10-CM | POA: Diagnosis not present

## 2021-05-30 HISTORY — PX: BIOPSY: SHX5522

## 2021-05-30 HISTORY — PX: ESOPHAGOGASTRODUODENOSCOPY (EGD) WITH PROPOFOL: SHX5813

## 2021-05-30 LAB — CBC WITH DIFFERENTIAL/PLATELET
Abs Immature Granulocytes: 0.01 10*3/uL (ref 0.00–0.07)
Basophils Absolute: 0 10*3/uL (ref 0.0–0.1)
Basophils Relative: 1 %
Eosinophils Absolute: 0.2 10*3/uL (ref 0.0–0.5)
Eosinophils Relative: 4 %
HCT: 40.1 % (ref 36.0–46.0)
Hemoglobin: 13.5 g/dL (ref 12.0–15.0)
Immature Granulocytes: 0 %
Lymphocytes Relative: 51 %
Lymphs Abs: 3 10*3/uL (ref 0.7–4.0)
MCH: 29 pg (ref 26.0–34.0)
MCHC: 33.7 g/dL (ref 30.0–36.0)
MCV: 86.1 fL (ref 80.0–100.0)
Monocytes Absolute: 0.4 10*3/uL (ref 0.1–1.0)
Monocytes Relative: 7 %
Neutro Abs: 2.1 10*3/uL (ref 1.7–7.7)
Neutrophils Relative %: 37 %
Platelets: 188 10*3/uL (ref 150–400)
RBC: 4.66 MIL/uL (ref 3.87–5.11)
RDW: 13.2 % (ref 11.5–15.5)
WBC: 5.8 10*3/uL (ref 4.0–10.5)
nRBC: 0 % (ref 0.0–0.2)

## 2021-05-30 LAB — GLUCOSE, CAPILLARY
Glucose-Capillary: 202 mg/dL — ABNORMAL HIGH (ref 70–99)
Glucose-Capillary: 83 mg/dL (ref 70–99)
Glucose-Capillary: 77 mg/dL (ref 70–99)
Glucose-Capillary: 249 mg/dL — ABNORMAL HIGH (ref 70–99)
Glucose-Capillary: 150 mg/dL — ABNORMAL HIGH (ref 70–99)

## 2021-05-30 LAB — COMPREHENSIVE METABOLIC PANEL
ALT: 10 U/L (ref 0–44)
AST: 27 U/L (ref 15–41)
Albumin: 2.7 g/dL — ABNORMAL LOW (ref 3.5–5.0)
Alkaline Phosphatase: 72 U/L (ref 38–126)
Anion gap: 7 (ref 5–15)
BUN: 5 mg/dL — ABNORMAL LOW (ref 6–20)
CO2: 23 mmol/L (ref 22–32)
Calcium: 8.6 mg/dL — ABNORMAL LOW (ref 8.9–10.3)
Chloride: 104 mmol/L (ref 98–111)
Creatinine, Ser: 0.5 mg/dL (ref 0.44–1.00)
GFR, Estimated: >60 mL/min (ref >60)
Glucose, Bld: 345 mg/dL — ABNORMAL HIGH (ref 70–99)
Potassium: 5.2 mmol/L — ABNORMAL HIGH (ref 3.5–5.1)
Sodium: 134 mmol/L — ABNORMAL LOW (ref 135–145)
Total Bilirubin: 0.9 mg/dL (ref 0.3–1.2)
Total Protein: 5.7 g/dL — ABNORMAL LOW (ref 6.5–8.1)

## 2021-05-30 LAB — MAGNESIUM: Magnesium: 1.9 mg/dL (ref 1.7–2.4)

## 2021-05-30 LAB — PHOSPHORUS: Phosphorus: 3.1 mg/dL (ref 2.5–4.6)

## 2021-05-30 SURGERY — ESOPHAGOGASTRODUODENOSCOPY (EGD) WITH PROPOFOL
Anesthesia: Monitor Anesthesia Care

## 2021-05-30 MED ORDER — SODIUM CHLORIDE 0.9 % IV SOLN: INTRAVENOUS | Status: DC

## 2021-05-30 MED ORDER — PROPOFOL 10 MG/ML IV BOLUS
INTRAVENOUS | Status: DC | PRN
Start: 2021-05-30 — End: 2021-05-30
  Administered 2021-05-30 (×2): 20 mg via INTRAVENOUS

## 2021-05-30 MED ORDER — HYDROMORPHONE HCL 1 MG/ML IJ SOLN
1.0000 mg | INTRAMUSCULAR | Status: AC | PRN
  Administered 2021-05-30 (×3): 1 mg via INTRAVENOUS
  Filled 2021-05-30 (×3): qty 1

## 2021-05-30 MED ORDER — LACTATED RINGERS IV SOLN: INTRAVENOUS | Status: DC | PRN

## 2021-05-30 MED ORDER — HYDROMORPHONE HCL 1 MG/ML IJ SOLN
1.0000 mg | INTRAMUSCULAR | Status: AC | PRN
  Administered 2021-05-30 – 2021-05-31 (×3): 1 mg via INTRAVENOUS
  Filled 2021-05-30 (×3): qty 1

## 2021-05-30 MED ORDER — OXYCODONE HCL 5 MG PO TABS
5.0000 mg | ORAL_TABLET | Freq: Four times a day (QID) | ORAL | Status: DC | PRN
  Administered 2021-05-30 – 2021-05-31 (×2): 5 mg via ORAL
  Filled 2021-05-30 (×2): qty 1

## 2021-05-30 MED ORDER — ONDANSETRON HCL 4 MG/2ML IJ SOLN: 4.0000 mg | Freq: Once | INTRAMUSCULAR | Status: DC | PRN

## 2021-05-30 MED ORDER — PANTOPRAZOLE SODIUM 40 MG PO TBEC
40.0000 mg | DELAYED_RELEASE_TABLET | Freq: Every day | ORAL | Status: DC
  Administered 2021-05-31: 40 mg via ORAL
  Filled 2021-05-30 (×3): qty 1

## 2021-05-30 MED ORDER — METOCLOPRAMIDE HCL 5 MG/ML IJ SOLN
10.0000 mg | Freq: Three times a day (TID) | INTRAMUSCULAR | Status: DC
  Administered 2021-05-30 – 2021-06-05 (×15): 10 mg via INTRAVENOUS
  Filled 2021-05-30 (×17): qty 2

## 2021-05-30 MED ORDER — SODIUM CHLORIDE 0.9 % IV BOLUS
500.0000 mL | Freq: Once | INTRAVENOUS | Status: AC
  Administered 2021-05-30: 500 mL via INTRAVENOUS

## 2021-05-30 MED ORDER — AMISULPRIDE (ANTIEMETIC) 5 MG/2ML IV SOLN: 10.0000 mg | Freq: Once | INTRAVENOUS | Status: DC | PRN

## 2021-05-30 MED ORDER — PROPOFOL 500 MG/50ML IV EMUL
INTRAVENOUS | Status: DC | PRN
  Administered 2021-05-30: 150 ug/kg/min via INTRAVENOUS

## 2021-05-30 SURGICAL SUPPLY — 15 items

## 2021-05-30 NOTE — Anesthesia Postprocedure Evaluation (Signed)
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  Procedure(s) Performed: ESOPHAGOGASTRODUODENOSCOPY (EGD) WITH PROPOFOL BIOPSY     Patient location during evaluation: PACU Anesthesia Type: MAC Level of consciousness: awake and alert Pain management: pain level controlled Vital Signs Assessment: post-procedure vital signs reviewed and stable Respiratory status: spontaneous breathing, nonlabored ventilation, respiratory function stable and patient connected to nasal cannula oxygen Cardiovascular status: stable and blood pressure returned to baseline Postop Assessment: no apparent nausea or vomiting Anesthetic complications: no   No notable events documented.  Last Vitals:  Vitals:   05/30/21 1241 05/30/21 1550  BP: 110/75   Pulse: 86   Resp: 20 20  Temp:    SpO2: 99%     Last Pain:  Vitals:   05/30/21 1831  TempSrc:   PainSc: 10-Worst pain ever                 Effie Berkshire

## 2021-05-30 NOTE — Progress Notes (Signed)
Patient recovered in PACU. Cbg 77, patient drank half a cup of juice, no further orders from dr. Hart [place]. Report given to [facility] RN on [facility]west.

## 2021-05-30 NOTE — Transfer of Care (Signed)
Immediate Anesthesia Transfer of Care Note  Patient: Julie [name]  Procedure(s) Performed: ESOPHAGOGASTRODUODENOSCOPY (EGD) WITH PROPOFOL BIOPSY  Patient Location: PACU  Anesthesia Type:MAC  Level of Consciousness: awake, alert  and oriented  Airway & Oxygen Therapy: Patient Spontanous Breathing  Post-op Assessment: Report given to RN and Post -op Vital signs reviewed and stable  Post vital signs: Reviewed and stable  Last Vitals:  Vitals Value Taken Time  BP 103/79 05/30/21 1218  Temp 36.3 C 05/30/21 1218  Pulse 91 05/30/21 1219  Resp 19 05/30/21 1219  SpO2 100 % 05/30/21 1219  Vitals shown include unvalidated device data.  Last Pain:  Vitals:   05/30/21 1218  TempSrc: Temporal  PainSc: 0-No pain      Patients Stated Pain Goal: 0 (28/36/62 9476)  Complications: No notable events documented.

## 2021-05-30 NOTE — Op Note (Addendum)
[facility] Patient Name: Julie [name] Procedure Date : 05/30/2021 MRN: [id number] Attending MD: Lynann Bologna , MD Date of Birth: 09/17/1991 CSN: [id number] Age: 30 Admit Type: Inpatient Procedure:                Upper GI endoscopy Indications:              Recurrent N/V with epi pain, atypical chest pain,                            dysphagia/odynophagia. Nl lipase. Neg CTA chest Providers:                Lynann Bologna, MD, Adolph Pollack, RN, Leanne Lovely, Technician, Dairl Ponder, CRNA Referring MD:              Medicines:                Monitored Anesthesia Care Complications:            No immediate complications. Estimated Blood Loss:     Estimated blood loss: none. Procedure:                Pre-Anesthesia Assessment:                           - Prior to the procedure, a History and Physical                            was performed, and patient medications and                            allergies were reviewed. The patient's tolerance of                            previous anesthesia was also reviewed. The risks                            and benefits of the procedure and the sedation                            options and risks were discussed with the patient.                            All questions were answered, and informed consent                            was obtained. Prior Anticoagulants: The patient has                            taken no previous anticoagulant or antiplatelet                            agents. ASA Grade Assessment: II - A patient with  mild systemic disease. After reviewing the risks                            and benefits, the patient was deemed in                            satisfactory condition to undergo the procedure.                           After obtaining informed consent, the endoscope was                            passed under direct vision. Throughout the                             procedure, the patient's blood pressure, pulse, and                            oxygen saturations were monitored continuously. The                            [id number]-[id number] ([id number] Olympus endoscope was introduced                            through the mouth, and advanced to the second part                            of duodenum. The upper GI endoscopy was                            accomplished without difficulty. The patient                            tolerated the procedure well. Findings:      The examined esophagus was normal with well-defined Z-line at 38 cm,       examined by NBI. No strictures. Biopsies were obtained from the       proximal, mid and distal esophagus with cold forceps for histology to       r/o EoE.      Scattered mild inflammation characterized by erythema was found in the       gastric body and in the gastric antrum. Biopsies were taken with a cold       forceps for histology. The stomach was somewhat J-shaped, patulous with       decreased tone. No outlet obstruction. (Few endoscopic pictures were not       retrieved d/t technical reasons). Multiple biopsies were taken from       throughout the stomach for histology ([place] protocol)      The examined duodenum was normal. Biopsies for histology were taken with       a cold forceps for evaluation of celiac disease. Impression:               - Normal esophagus. Biopsied.                           -  Gastritis. Biopsied.                           - Normal examined duodenum. Biopsied. Recommendation:           - Return patient to hospital ward for ongoing care.                           - Advance diet as tolerated.                           - Continue present medications. Please add Protonix                            40 mg p.o. once a day                           - Await pathology results.                           - The findings and recommendations were discussed                             with the patient.                           - Best possible control of diabetes.                           - Continue symptomatic management. If still with                            epigastric pain, consider [place] Abdo.                           - FU GI as outpt in 4-6 weeks.                           - Consider solid-phase gastric emptying scan as                            outpatient off Reglan x 7 days                           - Will sign off for now. Procedure Code(s):        --- Professional ---                           [phone], Esophagogastroduodenoscopy, flexible,                            transoral; with biopsy, single or multiple Diagnosis Code(s):        --- Professional ---                           K29.70, Gastritis, unspecified, without bleeding  R10.13, Epigastric pain CPT copyright 2019 American Medical Association. All rights reserved. The codes documented in this report are preliminary and upon coder review may  be revised to meet current compliance requirements. Lynann Bolognaajesh Geroldine Esquivias, MD 05/30/2021 12:27:58 PM This report has been signed electronically. Number of Addenda: 0

## 2021-05-30 NOTE — Interval H&P Note (Signed)
History and Physical Interval Note:  05/30/2021 11:47 AM  Julie [name]  has presented today for surgery, with the diagnosis of odynophagia, dysphagia, nausea and vomiting.  The various methods of treatment have been discussed with the patient and family. After consideration of risks, benefits and other options for treatment, the patient has consented to  Procedure(s): ESOPHAGOGASTRODUODENOSCOPY (EGD) WITH PROPOFOL (N/A) as a surgical intervention.  The patient's history has been reviewed, patient examined, no change in status, stable for surgery.  I have reviewed the patient's chart and labs.  Questions were answered to the patient's satisfaction.     Lynann Bologna

## 2021-05-30 NOTE — Progress Notes (Signed)
PROGRESS NOTE    Julie [name]  BJY:[id number] DOB: 16-Oct-1990 DOA: 05/28/2021 PCP: [facility]  Brief Narrative:  The patient is a 30 year old Hispanic female with a past medical history significant for but not limited to type 1 diabetes mellitus on insulin pump which recently broke, anxiety, insomnia, chronic low back pain status post lumbar laminectomy as well as other comorbidities who presented to the ED for evaluation of high blood sugar, chest pain and nausea vomiting.  She reports that she been taking 15 units of Levemir every night and 6 units in the low 3 times daily but her glucometer has been reading "high" last 2 weeks.  She is also experiencing severe sharp central chest pain over the same interval worse if she tries to eat or swallow anything.  She had 2 episodes of blood-tinged sputum roughly a week ago and she has had a some shortness of breath with minimal cough.  She reports that she has been unable to eat the last week due to her chest pain nausea vomiting and feels like her food gets stuck.  She denies any lower extremity swelling or tenderness.  Denies abdominal pain or diarrhea.  On arrival to the hospital she was tachycardic into the 130s.  CTA chest was negative for PE, acute cardiopulmonary disease or acute mediastinal findings.  Her initial blood sugar was 619 and bicarb was 15.  High-sensitivity troponins were undetectable and lipase was normal.  Urinalysis was done and did show glucosuria and ketonuria.  She is given 1-1/2 L of LR and 20" IV potassium and pain medication with IV Dilaudid and started on insulin infusion and transferred to [facility] for evaluation management of DKA and intractable nausea vomiting.  Because she had issues with her swallowing GI was consulted and they are planning an EGD and was done and showed a normal esophagus and gastritis that was biopsied. GI recommends adding po Protonix and controlling Diabetes.    Assessment &  Plan:   Principal Problem:   DKA, type 1 (HCC) Active Problems:   Depression with anxiety   Chest pain   Abdominal pain, epigastric  DKA in the setting of type 1 diabetes mellitus Intractable Nausea and Vomiting in the setting likely Diabetic Gastroparesis -She is type 1 diabetes mellitus and her last hemoglobin A1c was 10.9 in July -She presented with hyperglycemia and intractable nausea vomiting and found to have serum glucose is significant with a bicarb of 15 and anion gap of 17 with acute nausea -She is given IV fluid hydration in the ED and boluses and started on insulin infusion -This morning she had been weaned off of the insulin transition to subcu given that her bicarb is now normal x2 -Unfortunately she is still not tolerating oral diet given that she had intractable nausea vomiting and likely she does have gastroparesis in the setting of her uncontrolled diabetes -We will continue supportive care with antiemetics and she states that her Compazine gives her panic attacks and that Zofran is ineffective; she did receive Phenergan suppository.  Try for scopolamine patch and increased Reglan to 10 mg q8h -Given her intractable nausea vomiting we have consulted GI -CBGs have been proved slightly and have now been ranging from 140-219 -Continue monitor blood sugars carefully and adjust insulin as needed -We have increased her Levemir from 10 units daily to twice daily and added 4 units of NovoLog with meals and also add a sliding scale -Currently getting D5 and LR at 125 MLS per  hour given that she continues to be nauseous and vomiting -Will add Pantoprazole 40 mg po Daily -GI recommending Advancing Diet as Tolerated   Dysphagia and Odynophagia -Has been recently evaluated in the ED multiple times for chest pain which she attributes to food getting stuck -Started after COVID and she was prescribed antibiotic and steroid inhaler -Troponins have been flat -GI has been consulted and  they suspect esophageal candidiasis as well as a component of gastroparesis -SLP to evaluate bedside -GI recommending IV PPI twice daily and famotidine; GI recommending changing PPI po 40 mg Daily  -Continue with antiemetics -Patient is to undergo an EGD to rule out erosive esophagitis versus candidiasis esophagitis and PUD -She will need eventual gastric emptying study as an outpatient to rule out diabetic gastroparesis -We will give her scopolamine patch and initiate IV Reglan 5 mg but increased to 10 mg q8h -Appreciate specialist recommendations and she underwent EGD and showed some Gastritis   Chest pain -She has had 2 weeks of chest pain that has been evaluated multiple times in the ED -Troponins been flat -She had a CTA done and lungs are clear and normal mediastinum on CTA and the pain is central and sharp and radiates to the sides and to the back and is worse with swallowing and 8 -GI cocktail and Pepcid were trialed but she is now on IV PPI and transitioning to po Daily -Gastroenterology evaluating and planning for EGD and as below  Anxiety insomnia -Continue with Hydroxyzine and Zolpidem as needed  Chronic Back Pain with Radiculopathy -Underwent a laminectomy and will continue her home gabapentin  Hypophosphatemia -Mild with phosphorus level is now 3.1 -Continue monitor and replete as necessary -Repeat phosphorus level in a.m.  Hyponatremia -Mild and in the setting of hyperglycemia -Na+ went from 132 -> 134 -Continue monitor and trend and repeat CMP this evening  Tobacco abuse -Smoking cessation counseling given  Unwitnessed Fall -Laying on the Left Side on the Floor. Was ambulating back from the Restroom and States she tripped and fell on her Left Side -Happened after EGD -Complains of Tenderness Left Side of Her Hip, Head, and complaining of Shoulder Pain -Will order Head CT w/o Contrast, DG Left Shoulder, and DG Pelivs and Left Hip -Fall precautions -Will get  PT/OT to evaluate and Treat  DVT prophylaxis: Enoxaparin 40 mg subcu every 24 Code Status: FULL CODE  Family Communication: No family present at bedside Disposition Plan: Pending further clinical improvement and evaluation by gastroenterology; Will need to ensure she can tolerate diet properly without Nausea and Vomiting prior to D/C  Status is: Inpatient  Remains inpatient appropriate because:Unsafe d/c plan, IV treatments appropriate due to intensity of illness or inability to take PO, and Inpatient level of care appropriate due to severity of illness  Dispo: The patient is from: Home              Anticipated d/c is to: Home              Patient currently is not medically stable to d/c.   Difficult to place patient No  Consultants:  Gastroenterology  Procedures:  EGD Findings:      The examined esophagus was normal with well-defined Z-line at 38 cm,       examined by NBI. No strictures. Biopsies were obtained from the       proximal, mid and distal esophagus with cold forceps for histology to       r/o EoE.  Scattered mild inflammation characterized by erythema was found in the       gastric body and in the gastric antrum. Biopsies were taken with a cold       forceps for histology. The stomach was somewhat J-shaped, patulous with       decreased tone. No outlet obstruction. (Few endoscopic pictures were not       retrieved d/t technical reasons). Multiple biopsies were taken from       throughout the stomach for histology ([place] protocol)      The examined duodenum was normal. Biopsies for histology were taken with       a cold forceps for evaluation of celiac disease. Impression:               - Normal esophagus. Biopsied.                           - Gastritis. Biopsied.                           - Normal examined duodenum. Biopsied. Recommendation:           - Return patient to hospital ward for ongoing care.                           - Advance diet as tolerated.                            - Continue present medications. Please add Protonix                            40 mg p.o. once a day                           - Await pathology results.                           - The findings and recommendations were discussed                            with the patient.                           - Best possible control of diabetes.                           - Continue symptomatic management. If still with                            epigastric pain, consider [place] Abdo.                           - FU GI as outpt in 4-6 weeks.                           - Consider solid-phase gastric emptying scan as  outpatient off Reglan x 7 days                           - Will sign off for now.  Antimicrobials:  Anti-infectives (From admission, onward)    None        Subjective: Seen and examined at bedside this morning and she continues to be nauseous and was dry heaving.  Still complained of some chest discomfort I feel like food got stuck.  After she was given Vistaril she calmed down.  Nausea vomiting is improving slightly but she states her scopolamine patch is not working.  No other concerns or complaints at this time.  Subsequently later this afternoon patient had a unwitnessed fall so we will obtain imaging studies as she fell on her left side and hit her head, shoulder and her hip.  Objective: Vitals:   05/30/21 1218 05/30/21 1224 05/30/21 1235 05/30/21 1241  BP: 103/79 110/71 106/63 110/75  Pulse: 84 94 89 86  Resp: (!) 23 16 20 20   Temp: (!) 97.3 F (36.3 C)     TempSrc: Temporal     SpO2: 100% 100% 100% 99%  Weight:      Height:        Intake/Output Summary (Last 24 hours) at 05/30/2021 1545 Last data filed at 05/30/2021 1212 Gross per 24 hour  Intake 100 ml  Output --  Net 100 ml    Filed Weights   05/28/21 1858  Weight: 74.8 kg   Examination: Physical Exam:  Constitutional: WN/WD Hispanic female and some distress appears  extremely anxious and nauseous and a little uncomfortable Eyes: Lids and conjunctivae normal, sclerae anicteric  ENMT: External Ears, Nose appear normal. Grossly normal hearing.   Neck: Appears normal, supple, no cervical masses, normal ROM, no appreciable thyromegaly; no JVD Respiratory: Diminished to auscultation bilaterally, no wheezing, rales, rhonchi or crackles. Normal respiratory effort and patient is not tachypenic. No accessory muscle use. Unlabored breathing  Cardiovascular: RRR, no murmurs / rubs / gallops. S1 and S2 auscultated.  Abdomen: Soft, non-tender, mildly-distended. Bowel sounds positive.  GU: Deferred. Musculoskeletal: No clubbing / cyanosis of digits/nails. No joint deformity upper and lower extremities.  Skin: No rashes, lesions, ulcers. No induration; Warm and dry.  Neurologic: CN 2-12 grossly intact with no focal deficits. Romberg sign and cerebellar reflexes not assessed.  Psychiatric: Normal judgment and insight. Alert and oriented x 3. Anxious mood and appropriate affect.   Data Reviewed: I have personally reviewed following labs and imaging studies  CBC: Recent Labs  Lab 05/28/21 1900 05/28/21 1921 05/29/21 0327 05/29/21 1018 05/30/21 0133  WBC 9.7  --  8.6 6.4 5.8  NEUTROABS 6.1  --   --  2.5 2.1  HGB 15.8* 16.7* 13.3 13.1 13.5  HCT 47.2* 49.0* 39.3 38.5 40.1  MCV 85.7  --  85.6 84.8 86.1  PLT 277  --  237 216 188    Basic Metabolic Panel: Recent Labs  Lab 05/28/21 1921 05/28/21 1937 05/29/21 0303 05/29/21 0327 05/29/21 1018 05/30/21 0133  NA 122* 124* 133*  --  132* 134*  K 4.3 4.7 3.4*  --  3.7 5.2*  CL  --  92* 102  --  104 104  CO2  --  15* 22  --  22 23  GLUCOSE  --  619* 152*  --  178* 345*  BUN  --  14 7  --  <5* 5*  CREATININE  --  0.80 0.38*  --  0.35* 0.50  CALCIUM  --  8.7* 9.0  --  8.2* 8.6*  MG  --   --   --  1.5* 1.8 1.9  PHOS  --   --   --   --  2.3* 3.1    GFR: Estimated Creatinine Clearance: 114.9 mL/min (by C-G  formula based on SCr of 0.5 mg/dL). Liver Function Tests: Recent Labs  Lab 05/29/21 1018 05/30/21 0133  AST 15 27  ALT 11 10  ALKPHOS 72 72  BILITOT 0.7 0.9  PROT 5.6* 5.7*  ALBUMIN 2.8* 2.7*    Recent Labs  Lab 05/28/21 1937  LIPASE 19    No results for input(s): AMMONIA in the last 168 hours. Coagulation Profile: No results for input(s): INR, PROTIME in the last 168 hours. Cardiac Enzymes: No results for input(s): CKTOTAL, CKMB, CKMBINDEX, TROPONINI in the last 168 hours. BNP (last 3 results) No results for input(s): PROBNP in the last 8760 hours. HbA1C: Recent Labs    05/29/21 0303  HGBA1C 11.7*    CBG: Recent Labs  Lab 05/29/21 1124 05/29/21 1615 05/30/21 0735 05/30/21 1146 05/30/21 1234  GLUCAP 219* 221* 202* 83 77    Lipid Profile: No results for input(s): CHOL, HDL, LDLCALC, TRIG, CHOLHDL, LDLDIRECT in the last 72 hours. Thyroid Function Tests: No results for input(s): TSH, T4TOTAL, FREET4, T3FREE, THYROIDAB in the last 72 hours. Anemia Panel: No results for input(s): VITAMINB12, FOLATE, FERRITIN, TIBC, IRON, RETICCTPCT in the last 72 hours. Sepsis Labs: No results for input(s): PROCALCITON, LATICACIDVEN in the last 168 hours.  Recent Results (from the past 240 hour(s))  Resp Panel by RT-PCR (Flu A&B, Covid) Nasopharyngeal Swab     Status: None   Collection Time: 05/28/21  7:37 PM   Specimen: Nasopharyngeal Swab; Nasopharyngeal(NP) swabs in vial transport medium  Result Value Ref Range Status   SARS Coronavirus 2 by RT PCR NEGATIVE NEGATIVE Final    Comment: (NOTE) SARS-CoV-2 target nucleic acids are NOT DETECTED.  The SARS-CoV-2 RNA is generally detectable in upper respiratory specimens during the acute phase of infection. The lowest concentration of SARS-CoV-2 viral copies this assay can detect is 138 copies/mL. A negative result does not preclude SARS-Cov-2 infection and should not be used as the sole basis for treatment or other patient  management decisions. A negative result may occur with  improper specimen collection/handling, submission of specimen other than nasopharyngeal swab, presence of viral mutation(s) within the areas targeted by this assay, and inadequate number of viral copies(<138 copies/mL). A negative result must be combined with clinical observations, patient history, and epidemiological information. The expected result is Negative.  Fact Sheet for Patients:  BloggerCourse.com  Fact Sheet for Healthcare Providers:  SeriousBroker.it  This test is no t yet approved or cleared by the [place] FDA and  has been authorized for detection and/or diagnosis of SARS-CoV-2 by FDA under an Emergency Use Authorization (EUA). This EUA will remain  in effect (meaning this test can be used) for the duration of the COVID-19 declaration under Section 564(b)(1) of the Act, 21 U.S.C.section 360bbb-3(b)(1), unless the authorization is terminated  or revoked sooner.       Influenza A by PCR NEGATIVE NEGATIVE Final   Influenza B by PCR NEGATIVE NEGATIVE Final    Comment: (NOTE) The Xpert Xpress SARS-CoV-2/FLU/RSV plus assay is intended as an aid in the diagnosis of influenza from Nasopharyngeal swab specimens and should not be used as a sole basis for treatment.  Nasal washings and aspirates are unacceptable for Xpert Xpress SARS-CoV-2/FLU/RSV testing.  Fact Sheet for Patients: BloggerCourse.com  Fact Sheet for Healthcare Providers: SeriousBroker.it  This test is not yet approved or cleared by the [place] FDA and has been authorized for detection and/or diagnosis of SARS-CoV-2 by FDA under an Emergency Use Authorization (EUA). This EUA will remain in effect (meaning this test can be used) for the duration of the COVID-19 declaration under Section 564(b)(1) of the Act, 21 U.S.C. section 360bbb-3(b)(1),  unless the authorization is terminated or revoked.  Performed at [facility], [address] [place], [place] [address]      RN Pressure Injury Documentation:     Estimated body mass index is 23.01 kg/m as calculated from the following:   Height as of this encounter:  (1.803 m).   Weight as of this encounter: 74.8 kg.  Malnutrition Type:   Malnutrition Characteristics:   Nutrition Interventions:   Radiology Studies: CT Angio Chest PE W and/or Wo Contrast  Result Date: 05/28/2021 CLINICAL DATA:  Chest pain hyperglycemia EXAM: CT ANGIOGRAPHY CHEST WITH CONTRAST TECHNIQUE: Multidetector CT imaging of the chest was performed using the standard protocol during bolus administration of intravenous contrast. Multiplanar CT image reconstructions and MIPs were obtained to evaluate the vascular anatomy. CONTRAST:  OMNIPAQUE IOHEXOL 350 MG/ML SOLN COMPARISON:  Chest x-ray 05/18/2021 FINDINGS: Cardiovascular: Satisfactory opacification of the pulmonary arteries to the segmental level. No evidence of pulmonary embolism. Normal heart size. No pericardial effusion. Nonaneurysmal aorta. No dissection is seen. Mediastinum/Nodes: No enlarged mediastinal, hilar, or axillary lymph nodes. Thyroid gland, trachea, and esophagus demonstrate no significant findings. Lungs/Pleura: Lungs are clear. No pleural effusion or pneumothorax. Upper Abdomen: No acute abnormality. Musculoskeletal: No chest wall abnormality. No acute or significant osseous findings. Review of the MIP images confirms the above findings. IMPRESSION: Negative. No CT evidence for acute pulmonary embolus or aortic dissection. Clear lung fields Electronically Signed   By: Jasmine Pang M.D.   On: 05/28/2021 21:20    Scheduled Meds:  enoxaparin (LOVENOX) injection  40 mg Subcutaneous Daily   famotidine (PEPCID) IV  20 mg Intravenous Q12H   gabapentin  300 mg Oral TID   insulin aspart  0-9 Units Subcutaneous TID WC    insulin aspart  4 Units Subcutaneous TID WC   insulin detemir  10 Units Subcutaneous BID   metoCLOPramide (REGLAN) injection  10 mg Intravenous Q8H   [START ON 05/31/2021] pantoprazole  40 mg Oral Q0600   scopolamine  1 patch Transdermal Q72H   Continuous Infusions:  dextrose 5% lactated ringers 125 mL/hr at 05/29/21 1702   insulin Stopped (05/29/21 1117)   promethazine (PHENERGAN) injection (IM or IVPB) 6.25 mg (05/30/21 0531)    LOS: 1 day   Merlene Laughter, DO [facility] PAGER is on AMION  If 7PM-7AM, please contact night-coverage [email or web address]

## 2021-05-30 NOTE — Progress Notes (Signed)
Pt states that Dilaudid is effective only briefly, writer advised pt to try the Other prn medication and staff will follow up on the effectiveness.

## 2021-05-30 NOTE — Anesthesia Preprocedure Evaluation (Addendum)
Anesthesia Evaluation  Patient identified by MRN, date of birth, ID band Patient awake    Reviewed: Allergy & Precautions, NPO status , Patient's Chart, lab work & pertinent test results  Airway Mallampati: I  TM Distance: >3 FB Neck ROM: Full    Dental  (+) Teeth Intact, Dental Advisory Given   Pulmonary Current Smoker,    breath sounds clear to auscultation       Cardiovascular negative cardio ROS   Rhythm:Regular Rate:Normal     Neuro/Psych  Headaches, PSYCHIATRIC DISORDERS Anxiety Depression    GI/Hepatic Neg liver ROS, GERD  Medicated,  Endo/Other  diabetes, Type 2, Insulin Dependent  Renal/GU negative Renal ROS     Musculoskeletal negative musculoskeletal ROS (+)   Abdominal Normal abdominal exam  (+)   Peds  Hematology negative hematology ROS (+)   Anesthesia Other Findings   Reproductive/Obstetrics                            Anesthesia Physical Anesthesia Plan  ASA: 2  Anesthesia Plan: MAC   Post-op Pain Management:    Induction: Intravenous  PONV Risk Score and Plan: 0 and Propofol infusion  Airway Management Planned: Natural Airway and Simple Face Mask  Additional Equipment: None  Intra-op Plan:   Post-operative Plan:   Informed Consent: I have reviewed the patients History and Physical, chart, labs and discussed the procedure including the risks, benefits and alternatives for the proposed anesthesia with the patient or authorized representative who has indicated his/her understanding and acceptance.     Dental advisory given  Plan Discussed with: CRNA  Anesthesia Plan Comments:        Anesthesia Quick Evaluation

## 2021-05-31 DIAGNOSIS — F418 Other specified anxiety disorders: Secondary | ICD-10-CM | POA: Diagnosis not present

## 2021-05-31 DIAGNOSIS — R0789 Other chest pain: Secondary | ICD-10-CM | POA: Diagnosis not present

## 2021-05-31 DIAGNOSIS — E1010 Type 1 diabetes mellitus with ketoacidosis without coma: Secondary | ICD-10-CM | POA: Diagnosis not present

## 2021-05-31 DIAGNOSIS — R1013 Epigastric pain: Secondary | ICD-10-CM | POA: Diagnosis not present

## 2021-05-31 LAB — BASIC METABOLIC PANEL
Anion gap: 5 (ref 5–15)
BUN: 6 mg/dL (ref 6–20)
CO2: 23 mmol/L (ref 22–32)
Calcium: 8.4 mg/dL — ABNORMAL LOW (ref 8.9–10.3)
Chloride: 108 mmol/L (ref 98–111)
Creatinine, Ser: 0.51 mg/dL (ref 0.44–1.00)
GFR, Estimated: >60 mL/min (ref >60)
Glucose, Bld: 278 mg/dL — ABNORMAL HIGH (ref 70–99)
Potassium: 4.1 mmol/L (ref 3.5–5.1)
Sodium: 136 mmol/L (ref 135–145)

## 2021-05-31 LAB — MAGNESIUM: Magnesium: 1.6 mg/dL — ABNORMAL LOW (ref 1.7–2.4)

## 2021-05-31 LAB — GLUCOSE, CAPILLARY
Glucose-Capillary: 235 mg/dL — ABNORMAL HIGH (ref 70–99)
Glucose-Capillary: 103 mg/dL — ABNORMAL HIGH (ref 70–99)
Glucose-Capillary: 367 mg/dL — ABNORMAL HIGH (ref 70–99)
Glucose-Capillary: 101 mg/dL — ABNORMAL HIGH (ref 70–99)

## 2021-05-31 LAB — CBC
HCT: 36.4 % (ref 36.0–46.0)
Hemoglobin: 12.1 g/dL (ref 12.0–15.0)
MCH: 28.7 pg (ref 26.0–34.0)
MCHC: 33.2 g/dL (ref 30.0–36.0)
MCV: 86.5 fL (ref 80.0–100.0)
Platelets: 171 10*3/uL (ref 150–400)
RBC: 4.21 MIL/uL (ref 3.87–5.11)
RDW: 12.8 % (ref 11.5–15.5)
WBC: 7.7 10*3/uL (ref 4.0–10.5)
nRBC: 0 % (ref 0.0–0.2)

## 2021-05-31 LAB — PHOSPHORUS: Phosphorus: 3.7 mg/dL (ref 2.5–4.6)

## 2021-05-31 MED ORDER — MAGNESIUM SULFATE 2 GM/50ML IV SOLN
2.0000 g | Freq: Once | INTRAVENOUS | Status: AC
  Administered 2021-05-31: 2 g via INTRAVENOUS
  Filled 2021-05-31: qty 50

## 2021-05-31 MED ORDER — INFLUENZA VAC SPLIT QUAD 0.5 ML IM SUSY
0.5000 mL | PREFILLED_SYRINGE | INTRAMUSCULAR | Status: DC
  Filled 2021-05-31 (×2): qty 0.5

## 2021-05-31 MED ORDER — HYDROMORPHONE HCL 1 MG/ML IJ SOLN
1.0000 mg | INTRAMUSCULAR | Status: AC | PRN
  Administered 2021-05-31 (×3): 1 mg via INTRAVENOUS
  Filled 2021-05-31 (×3): qty 1

## 2021-05-31 MED ORDER — ALUM & MAG HYDROXIDE-SIMETH 200-200-20 MG/5ML PO SUSP
10.0000 mL | Freq: Three times a day (TID) | ORAL | Status: DC
  Administered 2021-05-31 – 2021-06-05 (×11): 10 mL via ORAL
  Filled 2021-05-31 (×13): qty 30

## 2021-05-31 MED ORDER — LIDOCAINE 5 % EX PTCH
1.0000 | MEDICATED_PATCH | CUTANEOUS | Status: DC
  Filled 2021-05-31 (×2): qty 1

## 2021-05-31 MED ORDER — DICLOFENAC SODIUM 1 % EX GEL
2.0000 g | Freq: Four times a day (QID) | CUTANEOUS | Status: DC
  Administered 2021-05-31 – 2021-06-05 (×11): 2 g via TOPICAL
  Filled 2021-05-31: qty 100

## 2021-05-31 NOTE — Progress Notes (Signed)
Pt requested pain med and phenergan, upon entering pt room, pt asleep. This RN turned lights on and pt remained at sleep. Meds not given at this time, will continue to monitor pt.

## 2021-05-31 NOTE — Evaluation (Signed)
Clinical/Bedside Swallow Evaluation Patient Details  Name: Julie [name] MRN: [id number] Date of Birth: 08-01-1991  Today's Date: 05/31/2021 Time: SLP Start Time (ACUTE ONLY): 0911 SLP Stop Time (ACUTE ONLY): 0927 SLP Time Calculation (min) (ACUTE ONLY): 16 min  Past Medical History:  Past Medical History:  Diagnosis Date   Bulimia nervosa    Dental caries    Diabetes mellitus without complication (HCC)    Genital warts    Noncompliance with diabetes treatment    Pelvic fracture [facility])    Past Surgical History:  Past Surgical History:  Procedure Laterality Date   BIOPSY  05/30/2021   Procedure: BIOPSY;  Surgeon: Lynann Bologna, MD;  Location: [facility] ENDOSCOPY;  Service: Endoscopy;;   CESAREAN SECTION     CESAREAN SECTION  2012   ESOPHAGOGASTRODUODENOSCOPY (EGD) WITH PROPOFOL N/A 05/30/2021   Procedure: ESOPHAGOGASTRODUODENOSCOPY (EGD) WITH PROPOFOL;  Surgeon: Lynann Bologna, MD;  Location: [facility] ENDOSCOPY;  Service: Endoscopy;  Laterality: N/A;   HPI:  Pt is a 30 y.o. female who presented to the ED for evaluation of high blood sugar, chest pain, nausea, and vomiting. On admission, pt reported, "severe sharp central chest pain over the same interval, worse when she tries to eat or swallow anything". Per note on 9/1 from ED RN at [facility], "Patient given water for PO challenge. Patient complaining of pain when swallowing. States feels pain down esophagus and then radiating to chest. States feels like something is stuck" PMH: type 1 diabetes mellitus, anxiety, insomnia, chronic low back pain status post lumbar laminectomy.  She had recent COVID-19 infection and reported onset of issues following her infection.  She is s/p EGD with findings of gastritis.  Her esophagus was normal with no strictures seen.  Unfortunately she continues to present with ongoing issues swallowing.   Assessment / Plan / Recommendation Clinical Impression  New swallowing evaluation orders were entered  this date due to onging swallowing issues.  Patient is s/p EGD with no findings in her esophagus.  Report stated her esophagus appeared normal with biopsies taken.  Mild inflammation characterized by erythema in the gastric body and antrum were seen with diagnosis of gastritis.  Head CT was showing no evidence of acute infarction, hemorrhage, hydrocephalus, extra-axial collection of mass lesion/mass effect and considered normal.  Cranial nerve exam was completed and unremarkable.  Lingual, labial, facial and jaw range of motion and strength were adequate.  Facial sensation was intact and she did not endorse a difference in sensation from the right to left side of her face.  She complained of ongoing swallowing issues characterized by chest pain 9/10 with any intake.  She reported that pain has not changed since EGD.  She was presented with ice chips, warm water and pureed material.  Oral holding noted and likely related to fear of pain from swallowing.  She was able to masticate the ice.  Swallow trigger was appreciated to palpation.  Patient noted to grimace and grab her chest with every bolus trial.  She complained of a 9/10 pain that radiates into her back with every bolus trial.  Suspect ongoing esophageal dysphagia.  Diet recommendations are deferred to the medical team.  Given no esophageal findings on EGD may want to consider esophagram to assess function.  Dr. Marland Mcalpine and RN Lajoyce Corners were notifed of these results.  ST will sign off at this time.  If we can be of further assistance please feel free to reconsult.  Thank you.    SLP Visit Diagnosis:  Dysphagia, unspecified (R13.10)    Aspiration Risk  No limitations    Diet Recommendation   Defer to the medical team  Medication Administration: Other (Comment) (per patient and/or medical team)    Other  Recommendations Recommended Consults: Consider esophageal assessment (ie esophagram)     Swallow Study   General HPI: Pt is a 30 y.o. female who  presented to the ED for evaluation of high blood sugar, chest pain, nausea, and vomiting. On admission, pt reported, "severe sharp central chest pain over the same interval, worse when she tries to eat or swallow anything". Per note on 9/1 from ED RN at [facility], "Patient given water for PO challenge. Patient complaining of pain when swallowing. States feels pain down esophagus and then radiating to chest. States feels like something is stuck" PMH: type 1 diabetes mellitus, anxiety, insomnia, chronic low back pain status post lumbar laminectomy.  She had recent COVID-19 infection and reported onset of issues following her infection.  She is s/p EGD with findings of gastritis.  Her esophagus was normal with no strictures seen.  Unfortunately she continues to present with issues swallowing. Type of Study: Bedside Swallow Evaluation Previous Swallow Assessment: 05/29/2021 with findings of possible esophageal dysphagai. Diet Prior to this Study: Dysphagia 3 (soft);Thin liquids Temperature Spikes Noted: No Respiratory Status: Room air History of Recent Intubation: No Behavior/Cognition: Alert;Cooperative Oral Cavity Assessment: Within Functional Limits Oral Care Completed by SLP: No Oral Cavity - Dentition: Missing dentition;Adequate natural dentition Vision: Functional for self-feeding Self-Feeding Abilities: Able to feed self Patient Positioning: Upright in bed Baseline Vocal Quality: Normal Volitional Swallow: Unable to elicit    Oral/Motor/Sensory Function Overall Oral Motor/Sensory Function: Within functional limits   Ice Chips Ice chips: Within functional limits Presentation: Spoon   Thin Liquid Thin Liquid: Within functional limits Presentation: Spoon    Nectar Thick Nectar Thick Liquid: Not tested   Honey Thick Honey Thick Liquid: Not tested   Puree Puree: Within functional limits Presentation: Spoon   Solid     Solid: Not tested     Dimas Aguas, MA,  CCC-SLP Acute Rehab SLP [phone]  Fleet Contras 05/31/2021,9:47 AM

## 2021-05-31 NOTE — Progress Notes (Signed)
PROGRESS NOTE    Julie [name]  RUE:[id number] DOB: [birth year] DOA: 05/28/2021 PCP: [facility]  Brief Narrative:  The patient is a 30 year old Hispanic female with a past medical history significant for but not limited to type 1 diabetes mellitus on insulin pump which recently broke, anxiety, insomnia, chronic low back pain status post lumbar laminectomy as well as other comorbidities who presented to the ED for evaluation of high blood sugar, chest pain and nausea vomiting.  She reports that she been taking 15 units of Levemir every night and 6 units in the low 3 times daily but her glucometer has been reading "high" last 2 weeks.  She is also experiencing severe sharp central chest pain over the same interval worse if she tries to eat or swallow anything.  She had 2 episodes of blood-tinged sputum roughly a week ago and she has had a some shortness of breath with minimal cough.  She reports that she has been unable to eat the last week due to her chest pain nausea vomiting and feels like her food gets stuck.  She denies any lower extremity swelling or tenderness.  Denies abdominal pain or diarrhea.  On arrival to the hospital she was tachycardic into the 130s.  CTA chest was negative for PE, acute cardiopulmonary disease or acute mediastinal findings.  Her initial blood sugar was 619 and bicarb was 15.  High-sensitivity troponins were undetectable and lipase was normal.  Urinalysis was done and did show glucosuria and ketonuria.  She is given 1-1/2 L of LR and 20" IV potassium and pain medication with IV Dilaudid and started on insulin infusion and transferred to [facility] for evaluation management of DKA and intractable nausea vomiting.  Because she had issues with her swallowing GI was consulted and they are planning an EGD and was done and showed a normal esophagus and gastritis that was biopsied. GI recommends adding po Protonix and controlling Diabetes.  Patient has not  advance her diet and only taking liquid diet given that a soft diet causes her chest discomfort and pain while she swallows.  SLP reevaluated and recommending esophagram but cannot be done until tomorrow.   Assessment & Plan:   Principal Problem:   DKA, type 1 (HCC) Active Problems:   Depression with anxiety   Chest pain   Abdominal pain, epigastric  DKA in the setting of type 1 diabetes mellitus Intractable Nausea and Vomiting in the setting likely Diabetic Gastroparesis -She is type 1 diabetes mellitus and her last hemoglobin A1c was 10.9 in July -She presented with hyperglycemia and intractable nausea vomiting and found to have serum glucose is significant with a bicarb of 15 and anion gap of 17 with acute nausea -She is given IV fluid hydration in the ED and boluses and started on insulin infusion -This morning she had been weaned off of the insulin transition to subcu given that her bicarb is now normal x2 -Unfortunately she is still not tolerating oral diet given that she had intractable nausea vomiting and likely she does have gastroparesis in the setting of her uncontrolled diabetes -We will continue supportive care with antiemetics and she states that her Compazine gives her panic attacks and that Zofran is ineffective; she did receive Phenergan suppository.  Try for scopolamine patch and increased Reglan to 10 mg q8h -Given her intractable nausea vomiting we have consulted GI -CBGs have been proved slightly and have now been ranging from 103-367 -Continue monitor blood sugars carefully and adjust insulin  as needed -We have increased her Levemir from 10 units daily to twice daily and added 4 units of NovoLog with meals and also add a sliding scale -Currently getting D5 and LR at 125 MLS per hour given that she continues to be nauseous and vomiting -Will add Pantoprazole 40 mg po Daily -GI recommending Advancing Diet as Tolerated   Dysphagia and Odynophagia -Has been recently  evaluated in the ED multiple times for chest pain which she attributes to food getting stuck -Started after COVID and she was prescribed antibiotic and steroid inhaler -Troponins have been flat -GI has been consulted and they suspect esophageal candidiasis as well as a component of gastroparesis -SLP to evaluate bedside and recommending esophagram after her EGD -GI recommending IV PPI twice daily and famotidine; GI recommending changing PPI po 40 mg Daily  -Continue with antiemetics -Patient is to undergo an EGD to rule out erosive esophagitis versus candidiasis esophagitis and PUD: EGD done and showed some gastritis and no structural issues with esophagus -She will need eventual gastric emptying study as an outpatient to rule out diabetic gastroparesis -We will give her scopolamine patch and initiate IV Reglan 5 mg but increased to 10 mg q8h -Appreciate specialist recommendations and she underwent EGD and showed some Gastritis   Chest pain -She has had 2 weeks of chest pain that has been evaluated multiple times in the ED -Troponins been flat -She had a CTA done and lungs are clear and normal mediastinum on CTA and the pain is central and sharp and radiates to the sides and to the back and is worse with swallowing and eating -GI cocktail and Pepcid were trialed but she is now on IV PPI and transitioning to po Daily -GI recommending trying 10 mL of GI cocktail prior to meals -Gastroenterology evaluating and planning for EGD and as below -SLP evaluated and recommending esophagram which is going to be done tomorrow -Question if the patient is malingering given that she has 3 kids at home and getting the benefits of her mother to Her kids while she is hospitalized -Psychiatry has been consulted to evaluate given that she has significant anxiety and possible somatic complaints related to her anxiety  Anxiety insomnia -Continue with Hydroxyzine and Zolpidem as needed -Psychiatry consulted and  there is questionable malingering  ? Malingering -Patient has 3 kids at home and patient's mother is taking care of the kids however she stated to the physical therapist that if she is not hospitalized that the patient's mother will not take care of the kids -Patient continues to be significantly anxious and a little depressed appearing; question if she is manifesting her psychiatric symptoms into somatic complaints -TOC has been consulted for further evaluation recommendations -Psych is consulted for further evaluation recommendation  Chronic Back Pain with Radiculopathy -Underwent a laminectomy and will continue her home gabapentin -DG Lumbar X-Ray as above   Hypophosphatemia -Mild with phosphorus level is now 3.7 -Continue monitor and replete as necessary -Repeat phosphorus level in a.m  Hypomagnesemia -Patient's Mag Level was 1.6 -Replete with IV Mag Sulfate 2 grams -Continue to Monitor and Replete as Necessary -Repeat CMP in the AM   Hyponatremia -Mild and in the setting of hyperglycemia -Na+ went from 132 -> 134 is improved to 136 -Continue monitor and trend and repeat CMP this evening  Tobacco abuse -Smoking cessation counseling given  Unwitnessed Fall -Laying on the Left Side on the Floor. Was ambulating back from the Restroom and States she tripped and fell  on her Left Side -Happened after EGD -Complains of Tenderness Left Side of Her Hip, Head, and complaining of Shoulder Pain -Order Head CT w/o Contrast and it was normal, DG Left Shoulder, is negative and DG Pelivs and Left Hip negative -She complained of lumbar pain as well and so lumbar x-ray done yesterday showed No acute osseous abnormality.  Mild disc space narrowing at L5-S1 -Fall precautions -Will get PT/OT to evaluate and Treat and they are recommending home health  DVT prophylaxis: Enoxaparin 40 mg subcu every 24 Code Status: FULL CODE  Family Communication: No family present at bedside Disposition Plan:  Pending further clinical improvement and evaluation by gastroenterology; Will need to ensure she can tolerate diet properly without Nausea and Vomiting prior to D/C  Status is: Inpatient  Remains inpatient appropriate because:Unsafe d/c plan, IV treatments appropriate due to intensity of illness or inability to take PO, and Inpatient level of care appropriate due to severity of illness  Dispo: The patient is from: Home              Anticipated d/c is to: Home              Patient currently is not medically stable to d/c.   Difficult to place patient No  Consultants:  Gastroenterology Psychiatry  Procedures:  EGD Findings:      The examined esophagus was normal with well-defined Z-line at 38 cm,       examined by NBI. No strictures. Biopsies were obtained from the       proximal, mid and distal esophagus with cold forceps for histology to       r/o EoE.      Scattered mild inflammation characterized by erythema was found in the       gastric body and in the gastric antrum. Biopsies were taken with a cold       forceps for histology. The stomach was somewhat J-shaped, patulous with       decreased tone. No outlet obstruction. (Few endoscopic pictures were not       retrieved d/t technical reasons). Multiple biopsies were taken from       throughout the stomach for histology ([place] protocol)      The examined duodenum was normal. Biopsies for histology were taken with       a cold forceps for evaluation of celiac disease. Impression:               - Normal esophagus. Biopsied.                           - Gastritis. Biopsied.                           - Normal examined duodenum. Biopsied. Recommendation:           - Return patient to hospital ward for ongoing care.                           - Advance diet as tolerated.                           - Continue present medications. Please add Protonix                            40  mg p.o. once a day                           - Await  pathology results.                           - The findings and recommendations were discussed                            with the patient.                           - Best possible control of diabetes.                           - Continue symptomatic management. If still with                            epigastric pain, consider [place] Abdo.                           - FU GI as outpt in 4-6 weeks.                           - Consider solid-phase gastric emptying scan as                            outpatient off Reglan x 7 days                           - Will sign off for now.  Antimicrobials:  Anti-infectives (From admission, onward)    None        Subjective: Seen and examined at bedside and she continues to state that she is nauseous and vomiting however she appeared very calm and comfortable.  Also complained of significant amount of pain but she did appear comfortable in the bed.  When she turned in the bed she exaggerated her pain and states that she cannot really walk but when therapy got her up she is at least a 3-4 out of 5 muscle strength but still complains of significant pain despite not looking in pain.  She is not taking solid foods down given that continues to cause her pain in her esophagus.  We will order an esophagram tomorrow to evaluate and possible modified barium swallow.  GI recommends continuing current plan of care and recommended 10 cc of GI cocktail prior to her eating her meals.  Patient has been extremely anxious and tearful.  Objective: Vitals:   05/30/21 2356 05/31/21 0802 05/31/21 1150 05/31/21 1600  BP: 136/88 (!) 155/99 128/84 126/86  Pulse: 80 79 95 98  Resp: Temp: 97.6 F (36.4 C) 98.6 F (37 C) 98.8 F (37.1 C) 98.5 F (36.9 C)  TempSrc: Oral Oral Oral Oral  SpO2: 100% 100% 100% 98%  Weight:      Height:        Intake/Output Summary (Last 24 hours) at 05/31/2021 1847 Last data filed at 05/31/2021 1700 Gross per 24 hour  Intake  4468.52 ml  Output --  Net 4468.52 ml  Filed Weights   05/28/21 1858  Weight: 74.8 kg   Examination: Physical Exam:  Constitutional: WN/WD Hispanic female who is extremely anxious and complains of being nauseous and in significant amount of pain but appears comfortable  Eyes: Lids and conjunctivae normal, sclerae anicteric  ENMT: External Ears, Nose appear normal. Grossly normal hearing.  Neck: Appears normal, supple, no cervical masses, normal ROM, no appreciable thyromegaly; no appreciable JVD Respiratory: Diminished to auscultation bilaterally, no wheezing, rales, rhonchi or crackles. Normal respiratory effort and patient is not tachypenic. No accessory muscle use.  Unlabored breathing and not wearing supplemental oxygen via nasal cannula Cardiovascular: RRR, no murmurs / rubs / gallops. S1 and S2 auscultated. No extremity edema. 2+ pedal pulses. No carotid bruits.  Abdomen: Soft, non-tender, non-distended. Bowel sounds positive.  GU: Deferred. Musculoskeletal: No clubbing / cyanosis of digits/nails. No joint deformity upper and lower extremities.  Skin: No rashes, lesions, ulcers on limited skin evaluation. No induration; Warm and dry.  Neurologic: CN 2-12 grossly intact with no focal deficits. Romberg sign and cerebellar reflexes not assessed.  Psychiatric: Normal judgment and insight. Alert and oriented x 3.  Extremely anxious mood and appears a little depressed.   Data Reviewed: I have personally reviewed following labs and imaging studies  CBC: Recent Labs  Lab 05/28/21 1900 05/28/21 1921 05/29/21 0327 05/29/21 1018 05/30/21 0133 05/31/21 0354  WBC 9.7  --  8.6 6.4 5.8 7.7  NEUTROABS 6.1  --   --  2.5 2.1  --   HGB 15.8* 16.7* 13.3 13.1 13.5 12.1  HCT 47.2* 49.0* 39.3 38.5 40.1 36.4  MCV 85.7  --  85.6 84.8 86.1 86.5  PLT 277  --  237 216 188 171    Basic Metabolic Panel: Recent Labs  Lab 05/28/21 1937 05/29/21 0303 05/29/21 0327 05/29/21 1018  05/30/21 0133 05/31/21 0354 05/31/21 0403  NA 124* 133*  --  132* 134* 136  --   K 4.7 3.4*  --  3.7 5.2* 4.1  --   CL 92* 102  --  104 104 108  --   CO2 15* 22  --  22 23 23   --   GLUCOSE 619* 152*  --  178* 345* 278*  --   BUN 14 7  --  <5* 5* 6  --   CREATININE 0.80 0.38*  --  0.35* 0.50 0.51  --   CALCIUM 8.7* 9.0  --  8.2* 8.6* 8.4*  --   MG  --   --  1.5* 1.8 1.9  --  1.6*  PHOS  --   --   --  2.3* 3.1  --  3.7    GFR: Estimated Creatinine Clearance: 114.9 mL/min (by C-G formula based on SCr of 0.51 mg/dL). Liver Function Tests: Recent Labs  Lab 05/29/21 1018 05/30/21 0133  AST 15 27  ALT 11 10  ALKPHOS 72 72  BILITOT 0.7 0.9  PROT 5.6* 5.7*  ALBUMIN 2.8* 2.7*    Recent Labs  Lab 05/28/21 1937  LIPASE 19    No results for input(s): AMMONIA in the last 168 hours. Coagulation Profile: No results for input(s): INR, PROTIME in the last 168 hours. Cardiac Enzymes: No results for input(s): CKTOTAL, CKMB, CKMBINDEX, TROPONINI in the last 168 hours. BNP (last 3 results) No results for input(s): PROBNP in the last 8760 hours. HbA1C: Recent Labs    05/29/21 0303  HGBA1C 11.7*    CBG: Recent Labs  Lab 05/30/21 1610 05/30/21 2025 05/31/21  0800 05/31/21 1149 05/31/21 1556  GLUCAP 249* 150* 235* 103* 367*    Lipid Profile: No results for input(s): CHOL, HDL, LDLCALC, TRIG, CHOLHDL, LDLDIRECT in the last 72 hours. Thyroid Function Tests: No results for input(s): TSH, T4TOTAL, FREET4, T3FREE, THYROIDAB in the last 72 hours. Anemia Panel: No results for input(s): VITAMINB12, FOLATE, FERRITIN, TIBC, IRON, RETICCTPCT in the last 72 hours. Sepsis Labs: No results for input(s): PROCALCITON, LATICACIDVEN in the last 168 hours.  Recent Results (from the past 240 hour(s))  Resp Panel by RT-PCR (Flu A&B, Covid) Nasopharyngeal Swab     Status: None   Collection Time: 05/28/21  7:37 PM   Specimen: Nasopharyngeal Swab; Nasopharyngeal(NP) swabs in vial transport  medium  Result Value Ref Range Status   SARS Coronavirus 2 by RT PCR NEGATIVE NEGATIVE Final    Comment: (NOTE) SARS-CoV-2 target nucleic acids are NOT DETECTED.  The SARS-CoV-2 RNA is generally detectable in upper respiratory specimens during the acute phase of infection. The lowest concentration of SARS-CoV-2 viral copies this assay can detect is 138 copies/mL. A negative result does not preclude SARS-Cov-2 infection and should not be used as the sole basis for treatment or other patient management decisions. A negative result may occur with  improper specimen collection/handling, submission of specimen other than nasopharyngeal swab, presence of viral mutation(s) within the areas targeted by this assay, and inadequate number of viral copies(<138 copies/mL). A negative result must be combined with clinical observations, patient history, and epidemiological information. The expected result is Negative.  Fact Sheet for Patients:  BloggerCourse.comhttps://www.fda.gov/media/152166/download  Fact Sheet for Healthcare Providers:  SeriousBroker.ithttps://www.fda.gov/media/152162/download  This test is no t yet approved or cleared by the [place] FDA and  has been authorized for detection and/or diagnosis of SARS-CoV-2 by FDA under an Emergency Use Authorization (EUA). This EUA will remain  in effect (meaning this test can be used) for the duration of the COVID-19 declaration under Section 564(b)(1) of the Act, 21 U.S.C.section 360bbb-3(b)(1), unless the authorization is terminated  or revoked sooner.       Influenza A by PCR NEGATIVE NEGATIVE Final   Influenza B by PCR NEGATIVE NEGATIVE Final    Comment: (NOTE) The Xpert Xpress SARS-CoV-2/FLU/RSV plus assay is intended as an aid in the diagnosis of influenza from Nasopharyngeal swab specimens and should not be used as a sole basis for treatment. Nasal washings and aspirates are unacceptable for Xpert Xpress SARS-CoV-2/FLU/RSV testing.  Fact Sheet for  Patients: BloggerCourse.comhttps://www.fda.gov/media/152166/download  Fact Sheet for Healthcare Providers: SeriousBroker.ithttps://www.fda.gov/media/152162/download  This test is not yet approved or cleared by the [place] FDA and has been authorized for detection and/or diagnosis of SARS-CoV-2 by FDA under an Emergency Use Authorization (EUA). This EUA will remain in effect (meaning this test can be used) for the duration of the COVID-19 declaration under Section 564(b)(1) of the Act, 21 U.S.C. section 360bbb-3(b)(1), unless the authorization is terminated or revoked.  Performed at [facility] [place], [address] [place], [place] [address]      RN Pressure Injury Documentation:     Estimated body mass index is 23.01 kg/m as calculated from the following:   Height as of this encounter: 5\' 11"  (1.803 m).   Weight as of this encounter: 74.8 kg.  Malnutrition Type:   Malnutrition Characteristics:   Nutrition Interventions:   Radiology Studies: DG Lumbar Spine 2-3 Views  Result Date: 05/30/2021 CLINICAL DATA:  Unwitnessed fall EXAM: LUMBAR SPINE - 2-3 VIEW COMPARISON:  MRI 02/26/2013 FINDINGS: There  is no evidence of lumbar spine fracture. Alignment is normal. Mild disc space narrowing at L5-S1. IUD in the pelvis. IMPRESSION: No acute osseous abnormality.  Mild disc space narrowing at L5-S1 Electronically Signed   By: Jasmine Pang M.D.   On: 05/30/2021 21:06   CT HEAD WO CONTRAST ( )  Result Date: 05/30/2021 CLINICAL DATA:  Head trauma EXAM: CT HEAD WITHOUT CONTRAST TECHNIQUE: Contiguous axial images were obtained from the base of the skull through the vertex without intravenous contrast. COMPARISON:  06/22/2013 FINDINGS: Brain: No evidence of acute infarction, hemorrhage, hydrocephalus, extra-axial collection or mass lesion/mass effect. Vascular: No hyperdense vessel or unexpected calcification. Skull: Normal. Negative for fracture or focal lesion. Sinuses/Orbits: Partial opacification of the  right maxillary sinus. Visualized paranasal sinuses and mastoid air cells are otherwise clear. Other: None. IMPRESSION: Normal head CT. Electronically Signed   By: Charline Bills M.D.   On: 05/30/2021 23:12   DG Shoulder Left  Result Date: 05/30/2021 CLINICAL DATA:  Unwitnessed fall EXAM: LEFT SHOULDER - 2+ VIEW COMPARISON:  None. FINDINGS: There is no evidence of fracture or dislocation. There is no evidence of arthropathy or other focal bone abnormality. Soft tissues are unremarkable. IMPRESSION: Negative. Electronically Signed   By: Jasmine Pang M.D.   On: 05/30/2021 21:05   DG HIP UNILAT WITH PELVIS 2-3 VIEWS LEFT  Result Date: 05/30/2021 CLINICAL DATA:  Fall and left hip pain. EXAM: DG HIP (WITH OR WITHOUT PELVIS) 2-3V LEFT COMPARISON:  None. FINDINGS: There is no evidence of hip fracture or dislocation. There is no evidence of arthropathy or other focal bone abnormality. IMPRESSION: Negative. Electronically Signed   By: Elgie Collard M.D.   On: 05/30/2021 21:03    Scheduled Meds:  alum & mag hydroxide-simeth  10 mL Oral TID AC   diclofenac Sodium  2 g Topical QID   enoxaparin (LOVENOX) injection  40 mg Subcutaneous Daily   famotidine (PEPCID) IV  20 mg Intravenous Q12H   gabapentin  300 mg Oral TID   [START ON 06/01/2021] influenza vac split quadrivalent PF  0.5 mL Intramuscular Tomorrow-1000   insulin aspart  0-9 Units Subcutaneous TID WC   insulin aspart  4 Units Subcutaneous TID WC   insulin detemir  10 Units Subcutaneous BID   lidocaine  1 patch Transdermal Q24H   metoCLOPramide (REGLAN) injection  10 mg Intravenous Q8H   pantoprazole  40 mg Oral Q0600   scopolamine  1 patch Transdermal Q72H   Continuous Infusions:  dextrose 5% lactated ringers 125 mL/hr at 05/31/21 1452   insulin Stopped (05/29/21 1117)   promethazine (PHENERGAN) injection (IM or IVPB) 6.25 mg (05/31/21 1515)    LOS: 2 days   Merlene Laughter, DO [facility] PAGER is on AMION  If 7PM-7AM,  please contact night-coverage [email or web address]

## 2021-05-31 NOTE — Evaluation (Signed)
Physical Therapy Evaluation Patient Details Name: Julie [name] MRN: [id number] DOB: 06-16-1991 Today's Date: 05/31/2021   History of Present Illness  30 y.o. female admitted 9/8 with DKA, CP and N/V. Pt with insulin pump which recently broke. EDG 9/10. Pt reports fall 9/10.  PMHx: DM type 1, anxiety, insomnia, chronic low back pain status post lumbar laminectomy  Clinical Impression  PT sidelying on arrival reporting pain in left hip and back stating she fell walking back from bathroom yesterday and tripped. Pt reports back sx in 2020 with use of WC for 68months after and being in a rehab center for a month. Pt with kids 2,4,13yo at home and reports independence and lack of narcotic use at home for pain because it makes her sleepy. Pt stating oxycodone did not help with pain but Dilaudid did and that she can't get it to feel better. Hot packs applied end of session. Pt with LLE weakness with inability to flex hip against gravity in sitting but walked with assist of RW and performed semisquat for pericare without UE assist. Pt with decreased strength, function and mobility limited by pain who will benefit from acute therapy to maximize independence and safety. Pt also sharing that mom is currently caring for kids but will not assist if she is not in the hospital.     Follow Up Recommendations Home health PT    Equipment Recommendations  None recommended by PT    Recommendations for Other Services       Precautions / Restrictions Precautions Precautions: Fall Restrictions Weight Bearing Restrictions: No      Mobility  Bed Mobility Overal bed mobility: Modified Independent Bed Mobility: Supine to Sit;Sit to Supine Rolling: Modified independent (Device/Increase time)   Supine to sit: Modified independent (Device/Increase time) Sit to supine: Modified independent (Device/Increase time)   General bed mobility comments: pt using bil UE to move LLE onto and off of bed with pt able  to roll and perform all mobility without assist. PT reporting left hip and back pain with all movement    Transfers Overall transfer level: Modified independent               General transfer comment: pt able to stand from bed and toilet without assist with pt reporting pain with use of rail from toilet  Ambulation/Gait Ambulation/Gait assistance: Min guard Gait Distance (Feet): 15 Feet Assistive device: Rolling walker (2 wheeled) Gait Pattern/deviations: Step-through pattern;Decreased stride length   Gait velocity interpretation: 1.31 - 2.62 ft/sec, indicative of limited community ambulator General Gait Details: pt with step through pattern with heavy reliance of bil UE on RW with gait. pt walked 15' to and from bathroom with seated rest on toilet  Stairs            Wheelchair Mobility    Modified Rankin (Stroke Patients Only)       Balance Overall balance assessment: History of Falls;Needs assistance Sitting-balance support: No upper extremity supported;Feet supported Sitting balance-Leahy Scale: Good Sitting balance - Comments: pt able to sit eOB and at toilet without assist   Standing balance support: No upper extremity supported;Bilateral upper extremity supported Standing balance-Leahy Scale: Good Standing balance comment: pt able to stand in partial squat for pericare and to manage clothes without assist or LOB. Reliance on bil UE on RW for gait                             Pertinent Vitals/Pain  Pain Assessment: 0-10 Pain Score: 8  Pain Location: left hip and back Pain Descriptors / Indicators: Aching;Guarding Pain Intervention(s): Limited activity within patient's tolerance;Monitored during session;Heat applied;Repositioned;Premedicated before session    Home Living Family/patient expects to be discharged to:: Private residence Living Arrangements: Children Available Help at Discharge: Family;Available PRN/intermittently Type of Home:  House Home Access: Stairs to enter Entrance Stairs-Rails: None Entrance Stairs-Number of Steps: 2 Home Layout: Laundry or work area in basement;Two level;Able to live on main level with bedroom/bathroom Home Equipment: Tub bench;Wheelchair - [employer] - 2 wheels      Prior Function Level of Independence: Independent         Comments: driving and cares for kids 2, 4, 13yo     Hand Dominance   Dominant Hand: Right    Extremity/Trunk Assessment   Upper Extremity Assessment Upper Extremity Assessment: Overall WFL for tasks assessed    Lower Extremity Assessment Lower Extremity Assessment: RLE deficits/detail RLE Deficits / Details: WFL LLE Deficits / Details: pt reports baseline numbness of left foot. PT today unable to perform hip flexion against gravity, knee extension 2/5, knee flexion 2/5. Pt reports pain limiting ROM and strength with pt using bil UE to move LLE off of and onto bed. Pt able to maintain partial squat bil LE for pericare without buckling    Cervical / Trunk Assessment Cervical / Trunk Assessment: Normal  Communication   Communication: No difficulties  Cognition Arousal/Alertness: Awake/alert Behavior During Therapy: Anxious Overall Cognitive Status: Within Functional Limits for tasks assessed                                        General Comments      Exercises     Assessment/Plan    PT Assessment Patient needs continued PT services  PT Problem List Decreased strength;Decreased mobility;Decreased activity tolerance;Decreased knowledge of use of DME;Pain       PT Treatment Interventions Gait training;DME instruction;Therapeutic exercise;Stair training;Functional mobility training;Therapeutic activities;Patient/family education;Balance training;Neuromuscular re-education    PT Goals (Current goals can be found in the Care Plan section)  Acute Rehab PT Goals Patient Stated Goal: return home without pain PT Goal  Formulation: With patient Time For Goal Achievement: 06/14/21 Potential to Achieve Goals: Fair    Frequency Min 3X/week   Barriers to discharge Decreased caregiver support      Co-evaluation               AM-PAC PT "6 Clicks" Mobility  Outcome Measure Help needed turning from your back to your side while in a flat bed without using bedrails?: None Help needed moving from lying on your back to sitting on the side of a flat bed without using bedrails?: None Help needed moving to and from a bed to a chair (including a wheelchair)?: A Little Help needed standing up from a chair using your arms (e.g., wheelchair or bedside chair)?: A Little Help needed to walk in hospital room?: A Little Help needed climbing 3-5 steps with a railing? : A Lot 6 Click Score: 19    End of Session Equipment Utilized During Treatment: Gait belt Activity Tolerance: Patient limited by pain Patient left: in bed;with call bell/phone within reach;with bed alarm set Nurse Communication: Mobility status PT Visit Diagnosis: Other abnormalities of gait and mobility (R26.89);Muscle weakness (generalized) (M62.81);Pain;Difficulty in walking, not elsewhere classified (R26.2) Pain - Right/Left: Left Pain - part of body:  Hip    Time: 0973-5329 PT Time Calculation (min) (ACUTE ONLY): 22 min   Charges:   PT Evaluation $PT Eval Moderate Complexity: 1 Mod          Tylie Golonka P, PT Acute Rehabilitation Services Pager: [phone] Office: [phone]   Ketan Renz B Janeil Schexnayder 05/31/2021, 11:54 AM

## 2021-05-31 NOTE — Progress Notes (Signed)
Occupational Therapy Evaluation Patient Details Name: Julie [name] MRN: [id number] DOB: 07/07/1991 Today's Date: 05/31/2021    History of Present Illness 30 y.o. female admitted 9/8 with DKA, CP and N/V. Pt with insulin pump which recently broke. EDG 9/10. Pt reports fall 9/10.  PMHx: DM type 1, anxiety, insomnia, chronic low back pain status post lumbar laminectomy   Clinical Impression   Pt presents with above diagnosis. PTA pt PLOF living at home with children. Pt reports mostly Ind to mod I with use of AE as needed for ADLs. Session limited due to c/o lower back pain, RN reported and advised to MD for pain management. Pt presents with hx of back surgery and limitations with movement/ sensation of LLE. Bed level assessment administered this session (see below for details). Deferred functional mobility at this time due to high pain level and for safety of pt from hx of fall, per chart. Further assessment required for functional stability and mobility to ensure safe transition to dc setting. Pt will benefit to continue skilled level OT to address safety awareness, strength, AE education, and pain management with compensatory strategies.     Follow Up Recommendations  Home health OT    Equipment Recommendations   (will continue to assess)    Recommendations for Other Services       Precautions / Restrictions Precautions Precautions: Fall Restrictions Weight Bearing Restrictions: No      Mobility Bed Mobility Overal bed mobility: Modified Independent Bed Mobility: Supine to Sit;Sit to Supine Rolling: Modified independent (Device/Increase time)   Supine to sit: Modified independent (Device/Increase time) Sit to supine: Modified independent (Device/Increase time)   General bed mobility comments: pt using bil UE to move LLE onto and off of bed with pt able to roll and perform all mobility without assist. PT reporting left hip and back pain with all movement     Transfers Overall transfer level: Modified independent               General transfer comment: pt able to stand from bed and toilet without assist with pt reporting pain with use of rail from toilet    Balance Overall balance assessment: History of Falls;Needs assistance Sitting-balance support: No upper extremity supported;Feet supported Sitting balance-Leahy Scale: Good Sitting balance - Comments: pt able to sit eOB and at toilet without assist   Standing balance support: No upper extremity supported;Bilateral upper extremity supported Standing balance-Leahy Scale: Good Standing balance comment: pt able to stand in partial squat for pericare and to manage clothes without assist or LOB. Reliance on bil UE on RW for gait                           ADL either performed or assessed with clinical judgement   ADL Overall ADL's : Needs assistance/impaired Eating/Feeding: Independent   Grooming: Wash/dry hands;Wash/dry face;Oral care;Set up Grooming Details (indicate cue type and reason): not assessed, however, will infer set up in sitting due to current c/o back pain.           Upper Body Dressing Details (indicate cue type and reason): Not formally assessed, however, will infer set up due to pain. Lower Body Dressing: Bed level;Moderate assistance Lower Body Dressing Details (indicate cue type and reason): Pt reports excrutiating back pain from fall on 9/10. LB dressing assessed with doffing/donning of sock at bed level, pt able to present RLE up to figure 4 position, however, reports increased pain with movement  of LLE.   Toilet Transfer Details (indicate cue type and reason): will further assess           General ADL Comments: Pt presents from recent fall on 9/10, lower back pain limited session on today with functional mobility. RN reports advising to MD. Rn reports pt has transferred to bathroom with +1 assist and RW.     Vision Baseline Vision/History: 1  Wears glasses       Perception     Praxis      Pertinent Vitals/Pain Pain Assessment: 0-10 Pain Score: 8  Pain Location: left hip and back Pain Descriptors / Indicators: Aching;Guarding Pain Intervention(s): Limited activity within patient's tolerance;Monitored during session;Heat applied;Repositioned;Premedicated before session     Hand Dominance Right   Extremity/Trunk Assessment Upper Extremity Assessment Upper Extremity Assessment: Overall WFL for tasks assessed   Lower Extremity Assessment Lower Extremity Assessment: RLE deficits/detail RLE Deficits / Details: WFL LLE Deficits / Details: pt reports baseline numbness of left foot. PT today unable to perform hip flexion against gravity, knee extension 2/5, knee flexion 2/5. Pt reports pain limiting ROM and strength with pt using bil UE to move LLE off of and onto bed. Pt able to maintain partial squat bil LE for pericare without buckling   Cervical / Trunk Assessment Cervical / Trunk Assessment: Normal   Communication Communication Communication: No difficulties   Cognition Arousal/Alertness: Awake/alert Behavior During Therapy: Anxious Overall Cognitive Status: Within Functional Limits for tasks assessed                                     General Comments       Exercises     Shoulder Instructions      Home Living Family/patient expects to be discharged to:: Private residence Living Arrangements: Children Available Help at Discharge: Family;Available PRN/intermittently Type of Home: House Home Access: Stairs to enter [employer] of Steps: 2 Entrance Stairs-Rails: None Home Layout: Laundry or work area in basement;Two level;Able to live on main level with bedroom/bathroom     Bathroom Shower/Tub: Chief Strategy Officer: Standard     Home Equipment: Tub bench;Wheelchair - [employer] - 2 wheels          Prior Functioning/Environment Level of Independence:  Independent        Comments: driving and cares for kids 2, 4, 13yo        OT Problem List: Impaired balance (sitting and/or standing);Decreased safety awareness;Impaired sensation;Pain;Decreased knowledge of use of DME or AE;Decreased knowledge of precautions      OT Treatment/Interventions: Self-care/ADL training;Therapeutic exercise;DME and/or AE instruction;Therapeutic activities;Patient/family education;Balance training    OT Goals(Current goals can be found in the care plan section) Acute Rehab OT Goals Patient Stated Goal: return home without pain OT Goal Formulation: With patient Time For Goal Achievement: 06/14/21 Potential to Achieve Goals: Good  OT Frequency: Min 2X/week   Barriers to D/C: Decreased caregiver support          Co-evaluation              AM-PAC OT "6 Clicks" Daily Activity     Outcome Measure Help from another person eating meals?: None Help from another person taking care of personal grooming?: A Little Help from another person toileting, which includes using toliet, bedpan, or urinal?: A Little Help from another person bathing (including washing, rinsing, drying)?: A Little Help from another person to put  on and taking off regular upper body clothing?: A Little Help from another person to put on and taking off regular lower body clothing?: A Lot 6 Click Score: 18   End of Session Nurse Communication: Patient requests pain meds  Activity Tolerance: Patient limited by pain Patient left: in bed;with call bell/phone within reach;with bed alarm set  OT Visit Diagnosis: History of falling (Z91.81);Pain                Time: 4270-6237 OT Time Calculation (min): 13 min Charges:  OT General Charges $OT Visit: 1 Visit OT Evaluation $OT Eval Low Complexity: 1 Low  Marquette Old, MSOT, OTR/L  Supplemental Rehabilitation Services  [phone]   Zigmund Daniel 05/31/2021, 12:29 PM

## 2021-06-01 ENCOUNTER — Inpatient Hospital Stay (HOSPITAL_COMMUNITY): Payer: Medicaid Other

## 2021-06-01 DIAGNOSIS — R0789 Other chest pain: Secondary | ICD-10-CM | POA: Diagnosis not present

## 2021-06-01 DIAGNOSIS — E1010 Type 1 diabetes mellitus with ketoacidosis without coma: Secondary | ICD-10-CM | POA: Diagnosis not present

## 2021-06-01 DIAGNOSIS — F418 Other specified anxiety disorders: Secondary | ICD-10-CM | POA: Diagnosis not present

## 2021-06-01 DIAGNOSIS — K224 Dyskinesia of esophagus: Secondary | ICD-10-CM

## 2021-06-01 DIAGNOSIS — R1013 Epigastric pain: Secondary | ICD-10-CM | POA: Diagnosis not present

## 2021-06-01 LAB — CBC WITH DIFFERENTIAL/PLATELET
Abs Immature Granulocytes: 0.03 10*3/uL (ref 0.00–0.07)
Basophils Absolute: 0 10*3/uL (ref 0.0–0.1)
Basophils Relative: 0 %
Eosinophils Absolute: 0.2 10*3/uL (ref 0.0–0.5)
Eosinophils Relative: 3 %
HCT: 38.7 % (ref 36.0–46.0)
Hemoglobin: 12.9 g/dL (ref 12.0–15.0)
Immature Granulocytes: 1 %
Lymphocytes Relative: 45 %
Lymphs Abs: 3 10*3/uL (ref 0.7–4.0)
MCH: 28.9 pg (ref 26.0–34.0)
MCHC: 33.3 g/dL (ref 30.0–36.0)
MCV: 86.8 fL (ref 80.0–100.0)
Monocytes Absolute: 0.5 10*3/uL (ref 0.1–1.0)
Monocytes Relative: 8 %
Neutro Abs: 2.9 10*3/uL (ref 1.7–7.7)
Neutrophils Relative %: 43 %
Platelets: 188 10*3/uL (ref 150–400)
RBC: 4.46 MIL/uL (ref 3.87–5.11)
RDW: 12.7 % (ref 11.5–15.5)
WBC: 6.7 10*3/uL (ref 4.0–10.5)
nRBC: 0 % (ref 0.0–0.2)

## 2021-06-01 LAB — COMPREHENSIVE METABOLIC PANEL
ALT: 11 U/L (ref 0–44)
AST: 27 U/L (ref 15–41)
Albumin: 2.8 g/dL — ABNORMAL LOW (ref 3.5–5.0)
Alkaline Phosphatase: 59 U/L (ref 38–126)
Anion gap: 5 (ref 5–15)
BUN: 6 mg/dL (ref 6–20)
CO2: 29 mmol/L (ref 22–32)
Calcium: 8.5 mg/dL — ABNORMAL LOW (ref 8.9–10.3)
Chloride: 104 mmol/L (ref 98–111)
Creatinine, Ser: 0.62 mg/dL (ref 0.44–1.00)
GFR, Estimated: >60 mL/min (ref >60)
Glucose, Bld: 139 mg/dL — ABNORMAL HIGH (ref 70–99)
Potassium: 4.6 mmol/L (ref 3.5–5.1)
Sodium: 138 mmol/L (ref 135–145)
Total Bilirubin: 0.7 mg/dL (ref 0.3–1.2)
Total Protein: 5.6 g/dL — ABNORMAL LOW (ref 6.5–8.1)

## 2021-06-01 LAB — GLUCOSE, CAPILLARY
Glucose-Capillary: 238 mg/dL — ABNORMAL HIGH (ref 70–99)
Glucose-Capillary: 62 mg/dL — ABNORMAL LOW (ref 70–99)
Glucose-Capillary: 86 mg/dL (ref 70–99)
Glucose-Capillary: 92 mg/dL (ref 70–99)
Glucose-Capillary: 253 mg/dL — ABNORMAL HIGH (ref 70–99)

## 2021-06-01 LAB — PHOSPHORUS: Phosphorus: 4.2 mg/dL (ref 2.5–4.6)

## 2021-06-01 LAB — MAGNESIUM: Magnesium: 2 mg/dL (ref 1.7–2.4)

## 2021-06-01 MED ORDER — DEXTROSE 50 % IV SOLN: 25.0000 mL | Freq: Once | INTRAVENOUS | Status: AC

## 2021-06-01 MED ORDER — BUSPIRONE HCL 10 MG PO TABS
15.0000 mg | ORAL_TABLET | Freq: Three times a day (TID) | ORAL | Status: DC | PRN
  Administered 2021-06-03 – 2021-06-05 (×2): 15 mg via ORAL
  Filled 2021-06-01 (×2): qty 2

## 2021-06-01 MED ORDER — LIDOCAINE VISCOUS HCL 2 % MT SOLN
15.0000 mL | Freq: Three times a day (TID) | OROMUCOSAL | Status: AC
  Administered 2021-06-02: 15 mL via OROMUCOSAL
  Filled 2021-06-01 (×8): qty 15

## 2021-06-01 MED ORDER — HYDROCODONE-ACETAMINOPHEN 5-325 MG PO TABS: 1.0000 | ORAL_TABLET | Freq: Four times a day (QID) | ORAL | Status: DC | PRN

## 2021-06-01 MED ORDER — OXYCODONE HCL 5 MG PO TABS
10.0000 mg | ORAL_TABLET | Freq: Three times a day (TID) | ORAL | Status: DC | PRN
  Administered 2021-06-02 – 2021-06-04 (×4): 10 mg via ORAL
  Filled 2021-06-01 (×4): qty 2

## 2021-06-01 MED ORDER — SUCRALFATE 1 G PO TABS
1.0000 g | ORAL_TABLET | Freq: Three times a day (TID) | ORAL | Status: DC
  Administered 2021-06-01 – 2021-06-05 (×15): 1 g via ORAL
  Filled 2021-06-01 (×15): qty 1

## 2021-06-01 MED ORDER — PANTOPRAZOLE SODIUM 40 MG IV SOLR
40.0000 mg | Freq: Two times a day (BID) | INTRAVENOUS | Status: DC
  Administered 2021-06-01 – 2021-06-03 (×5): 40 mg via INTRAVENOUS
  Filled 2021-06-01 (×5): qty 40

## 2021-06-01 MED ORDER — GABAPENTIN 400 MG PO CAPS
400.0000 mg | ORAL_CAPSULE | Freq: Three times a day (TID) | ORAL | Status: DC
  Administered 2021-06-01 – 2021-06-05 (×12): 400 mg via ORAL
  Filled 2021-06-01 (×13): qty 1

## 2021-06-01 MED ORDER — HYDROCODONE-ACETAMINOPHEN 5-325 MG PO TABS
1.0000 | ORAL_TABLET | Freq: Four times a day (QID) | ORAL | Status: DC | PRN
  Administered 2021-06-02 – 2021-06-04 (×7): 1 via ORAL
  Filled 2021-06-01 (×9): qty 1

## 2021-06-01 MED ORDER — DEXTROSE 50 % IV SOLN
INTRAVENOUS | Status: AC
  Administered 2021-06-01: 25 mL
  Filled 2021-06-01: qty 50

## 2021-06-01 MED ORDER — LISDEXAMFETAMINE DIMESYLATE 20 MG PO CAPS
40.0000 mg | ORAL_CAPSULE | Freq: Every day | ORAL | Status: DC
  Administered 2021-06-02 – 2021-06-05 (×4): 40 mg via ORAL
  Filled 2021-06-01 (×3): qty 2

## 2021-06-01 MED ORDER — MAGIC MOUTHWASH W/LIDOCAINE
5.0000 mL | Freq: Three times a day (TID) | ORAL | Status: DC | PRN
  Filled 2021-06-01: qty 5

## 2021-06-01 MED ORDER — HYDROMORPHONE HCL 1 MG/ML IJ SOLN
1.0000 mg | Freq: Once | INTRAMUSCULAR | Status: AC
Start: 2021-06-01 — End: 2021-06-01
  Administered 2021-06-01: 1 mg via INTRAVENOUS
  Filled 2021-06-01: qty 1

## 2021-06-01 NOTE — Progress Notes (Addendum)
Daily Rounding Note  06/01/2021, 2:55 PM  LOS: 3 days   SUBJECTIVE:   Chief complaint:   Persistent dysphagia.  Hospitalist calling GI back because patient has persistent odynophagia.. 05/30/2021 EGD (Dr Chales Abrahams) for nausea, vomiting, epigastric and chest pain, dysphagia, odynophagia.  Study revealed normal esophagus but was biopsied.  Gastritis was biopsied.  Normal examined duodenum.  Protonix 40 mg daily recommended.  Pathology is still pending.   Current GI related meds: Mylanta AC, famotidine 20 mg IV q 12 h, Reglan 10 mg IV q 8 h, Protonix 40 mg po daily, scopalamine patch, lidoderm patch, Phenergan IV prn (2 to 3 doses/24 hours in last 3 days).  Also receiving Dilaudid IV prn (3 mg total yesterday), Toradol, Oxycodone was discontinued today.  The only thing that seems to help is the Dilaudid.  Patient has a history of bulimia that was an issue about 10 years ago.  She was treated with an antibiotic (type not known) and steroid for positive COVID-19 on 8/4 (no records in epic).  Chest pain and other above sxs had persisted since then.  Also has hx post-laminectomy syndrome, low back pain.  Referral to Dr Roderic Ovens for pain mgt per record.  There is a question of diabetic iinduced motor neuron damage w LE weakness requiring wheelchair during after 01/2021 admission.   He suggested use of Nucynta for pain mgt.  See original GI consult note of 05/29/2021.  Since EGD still not tolerating po.  Still has significant chest pain "like stabbing needles" throughout sternum when she attempts liquids or solids.  Material passes through but she occasionally still vomits bilious material.  No pain w twisting of torso.  No difficulty breathing.  Stools brown but last occurred a few d ago in setting of only liquid po  intake.  In addition having pain in mouth where a tooth broke off,  pain in L hip, low back from recent fall (xrays of 9/10 negative for acute  trauma), + headaches (CT head 9/10 normal).  Additionally a CT of the chest is also negative for PE, aortic dissection, trauma. 06/01/2021 esophagram: Barium tablet failed to pass distal esophagus and lodged about 4 to 5 cm above GE junction.  No evidence of mucosal irregularity, stricture or mass.       OBJECTIVE:         Vital signs in last 24 hours:    Temp:  [98.5 F (36.9 C)] 98.5 F (36.9 C) (09/11 2125) Pulse Rate:  [84-98] 84 (09/11 2125) Resp:  [16-17] 16 (09/11 2125) BP: (126-157)/(86-96) 157/96 (09/11 2125) SpO2:  [95 %-98 %] 95 % (09/11 2125) Last BM Date: 05/28/21 Filed Weights   05/28/21 1858  Weight: 74.8 kg   General: anxious.  Looks uncomfortable but does not look ill.  Asking when she can get her next dose of Dilaudid Heart: RRR. Chest: Labored breathing or cough.  Mild sternal tenderness to palpation. Abdomen: Soft without tenderness.  Active bowel sounds.  No distention.  No HSM, masses, bruits, hernias. Extremities: No CCE. Neuro/Psych: Anxious.  Involuntary shaking/tremor of legs, looking a bit like restless leg.  No gross deficits.  Intake/Output from previous day: 09/11 0701 - 09/12 0700 In: 4350.5 [P.O.:240; I.V.:2769.9; IV Piggyback:1340.6] Out: -   Intake/Output this shift: No intake/output data recorded.  Lab Results: Recent Labs    05/30/21 0133 05/31/21 0354 06/01/21 0217  WBC 5.8 7.7 6.7  HGB 13.5 12.1 12.9  HCT 40.1  36.4 38.7  PLT 188 171 188   BMET Recent Labs    05/30/21 0133 05/31/21 0354 06/01/21 0217  NA 134* 136 138  K 5.2* 4.1 4.6  CL 104 108 104  CO2 23 23 29   GLUCOSE 345* 278* 139*  BUN 5* 6 6  CREATININE 0.50 0.51 0.62  CALCIUM 8.6* 8.4* 8.5*   LFT Recent Labs    05/30/21 0133 06/01/21 0217  PROT 5.7* 5.6*  ALBUMIN 2.7* 2.8*  AST 27 27  ALT 10 11  ALKPHOS 72 59  BILITOT 0.9 0.7   PT/INR No results for input(s): LABPROT, INR in the last 72 hours. Hepatitis Panel No results for input(s): HEPBSAG, HCVAB,  HEPAIGM, HEPBIGM in the last 72 hours.  Studies/Results: DG Lumbar Spine 2-3 Views  Result Date: 05/30/2021 CLINICAL DATA:  Unwitnessed fall EXAM: LUMBAR SPINE - 2-3 VIEW COMPARISON:  MRI 02/26/2013 FINDINGS: There is no evidence of lumbar spine fracture. Alignment is normal. Mild disc space narrowing at L5-S1. IUD in the pelvis. IMPRESSION: No acute osseous abnormality.  Mild disc space narrowing at L5-S1 Electronically Signed   By: 04/28/2013 M.D.   On: 05/30/2021 21:06   CT HEAD WO CONTRAST (07/30/2021)  Result Date: 05/30/2021 CLINICAL DATA:  Head trauma EXAM: CT HEAD WITHOUT CONTRAST TECHNIQUE: Contiguous axial images were obtained from the base of the skull through the vertex without intravenous contrast. COMPARISON:  06/22/2013 FINDINGS: Brain: No evidence of acute infarction, hemorrhage, hydrocephalus, extra-axial collection or mass lesion/mass effect. Vascular: No hyperdense vessel or unexpected calcification. Skull: Normal. Negative for fracture or focal lesion. Sinuses/Orbits: Partial opacification of the right maxillary sinus. Visualized paranasal sinuses and mastoid air cells are otherwise clear. Other: None. IMPRESSION: Normal head CT. Electronically Signed   By: 08/22/2013 M.D.   On: 05/30/2021 23:12   DG Shoulder Left  Result Date: 05/30/2021 CLINICAL DATA:  Unwitnessed fall EXAM: LEFT SHOULDER - 2+ VIEW COMPARISON:  None. FINDINGS: There is no evidence of fracture or dislocation. There is no evidence of arthropathy or other focal bone abnormality. Soft tissues are unremarkable. IMPRESSION: Negative. Electronically Signed   By: 07/30/2021 M.D.   On: 05/30/2021 21:05   DG HIP UNILAT WITH PELVIS 2-3 VIEWS LEFT  Result Date: 05/30/2021 CLINICAL DATA:  Fall and left hip pain. EXAM: DG HIP (WITH OR WITHOUT PELVIS) 2-3V LEFT COMPARISON:  None. FINDINGS: There is no evidence of hip fracture or dislocation. There is no evidence of arthropathy or other focal bone abnormality.  IMPRESSION: Negative. Electronically Signed   By: 07/30/2021 M.D.   On: 05/30/2021 21:03   DG ESOPHAGUS W SINGLE CM (SOL OR THIN BA)  Result Date: 06/01/2021 CLINICAL DATA:  Recent history of esophagitis. EXAM: ESOPHOGRAM / BARIUM SWALLOW / BARIUM TABLET STUDY TECHNIQUE: Single contrast examination performed using thin barium liquid. The patient was observed with fluoroscopy swallowing a 13 mm barium sulphate tablet. FLUOROSCOPY TIME:  Fluoroscopy Time:  2 minutes 12 seconds Radiation Exposure Index (if provided by the fluoroscopic device): 2 Number of Acquired Spot Images: 28.3 mGy COMPARISON:  None. FINDINGS: Exam is limited by patient weakness. Patient was unable to stand. Patient was evaluated in semi upright position. Single contrast examination of the esophagus demonstrated no obvious mucosal irregularity, stricture or mass. Contrast flows through GE junction easily. No tertiary contractions were identified. Barium tablet was administered which failed to pass the distal esophagus. Tablet lodged proximally 4 cm from the GE junction. There is a subtle thin indentation at  this level suggests potential stricture or web. Tablet failed to pass multiple sips of thin barium. IMPRESSION: 1. Barium tablet failed to pass the distal esophagus. Tablet lodged approximately 4-5 cm from the GE junction. Suspicion of occult mucosal inflammation or stricturing at this level. 2. No additional evidence mucosal irregularity, stricture, or mass. Electronically Signed   By: Genevive Bi M.D.   On: 06/01/2021 13:24    ASSESMENT:     Persistent dysphagia.  EGD a few d ago w normal, non-strictured esophagus, gastritis.  Esoph and gastric bx pathology is pending.       IDDM, type 1.  A1c 11.7 at admission.       Anxiety.   Note that psychiatry is wondering whether this is some matization or secondary gain since her mother is now responsible for watching the patient's 3 children.   PLAN     Trial of viscous  lidocaine AC.  Continue to try to wean off narcotics which can create an achalasia type picture.  Continue the lidoderm patches, carafate, reglan.  Stop Famotidine IV and switch to Protonix 40 mg IV bid for now.      Await pathology from EGD.      Jennye Moccasin  06/01/2021, 2:55 PM Phone [phone]

## 2021-06-01 NOTE — TOC Initial Note (Signed)
Transition of Care [facility]) - Initial/Assessment Note    Patient Details  Name: Julie [name] MRN: [id number] Date of Birth: [birth year]  Transition [facility] CM/SW Contact:    Lorri Frederick, LCSW Phone Number: 06/01/2021, 2:42 PM  Clinical Narrative:  CSW spoke with pt regarding recommendation for [facility].  Pt agreeable, choice document given.  CSW sought permission to speak with pt mother but pt refused.  Pt reports her mother is currently caring for her children but only because pt is in the hospital.  Pt reports her mother normally charges her a lot of money to watch her children, despite the fact that pt has helped her mother financially in the past with bills and other expenses.  Mother's name is Almira Coaster and she is Spanish speaking, per pt.  PCP in place. Current DME in home: walker, wheelchair.  Pt reports that she got her insulin pump over a year ago through endocrinology office in [facility] on [address]: [phone].                   Expected Discharge Plan: Home w Home Health Services Barriers to Discharge: Continued Medical Work up   Patient Goals and CMS Choice Patient states their goals for this hospitalization and ongoing recovery are:: "zero pain" CMS Medicare.gov Compare Post Acute Care list provided to:: Patient Choice offered to / list presented to : Patient  Expected Discharge Plan and Services Expected Discharge Plan: Home w Home Health Services In-house Referral: Clinical Social Work   Post Acute Care Choice: Home Health Living arrangements for the past 2 months: Single Family Home                                      Prior Living Arrangements/Services Living arrangements for the past 2 months: Single Family Home Lives with:: Minor Children Patient language and need for interpreter reviewed:: Yes Do you feel safe going back to the place where you live?: Yes      Need for Family Participation in Patient Care: No (Comment) Care giver  support system in place?: Yes (comment) Current home services: Other (comment) (none) Criminal Activity/Legal Involvement Pertinent to Current Situation/Hospitalization: No - Comment as needed  Activities of Daily Living      Permission Sought/Granted Permission sought to share information with :  (Pt refused to give permission to speak with contact/support person)                Emotional Assessment Appearance:: Appears stated age Attitude/Demeanor/Rapport: Engaged Affect (typically observed): Appropriate, Pleasant Orientation: : Oriented to Self, Oriented to Place, Oriented to  Time, Oriented to Situation Alcohol / Substance Use: Not Applicable Psych Involvement: Yes (comment)  Admission diagnosis:  DKA, type 1 (HCC) [E10.10] Diabetic ketoacidosis without coma associated with type 1 diabetes mellitus (HCC) [E10.10] Patient Active Problem List   Diagnosis Date Noted   Abdominal pain, epigastric    Chest pain 05/29/2021   Chronic back pain 07/26/2013   Headache(784.0) 06/22/2013   Depression with anxiety 06/22/2013   Dehydration 04/02/2013   Rt flank pain 04/01/2013   H/O medication noncompliance 04/01/2013   Left leg weakness 02/25/2013   Pustular rash 02/23/2013   Gingivitis 10/24/2012   DKA, type 1 (HCC) 10/22/2012   Hypotension 10/22/2012   Hypokalemia 10/22/2012   PCP:  [facility], [facility] Pharmacy:   [id number] - [place], [place] -  [address] [place] [id number] Phone: [phone] Fax: [phone]     Social Determinants of Health ([place]) Interventions    Readmission Risk Interventions No flowsheet data found.

## 2021-06-01 NOTE — Progress Notes (Signed)
PROGRESS NOTE    Julie [name]  ZOX:[id number] DOB: Jul 01, 1991 DOA: 05/28/2021 PCP: [facility]  Brief Narrative:  The patient is a 30 year old Hispanic female with a past medical history significant for but not limited to type 1 diabetes mellitus on insulin pump which recently broke, anxiety, insomnia, chronic low back pain status post lumbar laminectomy as well as other comorbidities who presented to the ED for evaluation of high blood sugar, chest pain and nausea vomiting.  She reports that she been taking 15 units of Levemir every night and 6 units in the low 3 times daily but her glucometer has been reading "high" last 2 weeks.  She is also experiencing severe sharp central chest pain over the same interval worse if she tries to eat or swallow anything.  She had 2 episodes of blood-tinged sputum roughly a week ago and she has had a some shortness of breath with minimal cough.  She reports that she has been unable to eat the last week due to her chest pain nausea vomiting and feels like her food gets stuck.  She denies any lower extremity swelling or tenderness.  Denies abdominal pain or diarrhea.  On arrival to the hospital she was tachycardic into the 130s.  CTA chest was negative for PE, acute cardiopulmonary disease or acute mediastinal findings.  Her initial blood sugar was 619 and bicarb was 15.  High-sensitivity troponins were undetectable and lipase was normal.  Urinalysis was done and did show glucosuria and ketonuria.  She is given 1-1/2 L of LR and 20" IV potassium and pain medication with IV Dilaudid and started on insulin infusion and transferred to [facility] for evaluation management of DKA and intractable nausea vomiting.  Because she had issues with her swallowing GI was consulted and they are planning an EGD and was done and showed a normal esophagus and gastritis that was biopsied. GI recommends adding po Protonix and controlling Diabetes.  Patient has not  advance her diet and only taking liquid diet given that a soft diet causes her chest discomfort and pain while she swallows.  SLP reevaluated and recommending esophagram but cannot be done until tomorrow.   Assessment & Plan:   Principal Problem:   DKA, type 1 (HCC) Active Problems:   Depression with anxiety   Chest pain   Abdominal pain, epigastric  DKA in the setting of type 1 diabetes mellitus Intractable Nausea and Vomiting in the setting likely Diabetic Gastroparesis -She is type 1 diabetes mellitus and her last hemoglobin A1c was 10.9 in July -She presented with hyperglycemia and intractable nausea vomiting and found to have serum glucose is significant with a bicarb of 15 and anion gap of 17 with acute nausea -She is given IV fluid hydration in the ED and boluses and started on insulin infusion -She had been weaned off of the insulin transition to subcu given that her bicarb is now normal x2 -Unfortunately she is still not tolerating oral diet given that she had intractable nausea vomiting and likely she does have gastroparesis in the setting of her uncontrolled diabetes -We will continue supportive care with antiemetics and she states that her Compazine gives her panic attacks and that Zofran is ineffective; she did receive Phenergan suppository.  Try for scopolamine patch and increased Reglan to 10 mg q8h -Given her intractable nausea vomiting we have consulted GI -CBGs have been proved slightly and have now been ranging from 62-238 -Continue monitor blood sugars carefully and adjust insulin as needed -  We have increased her Levemir from 10 units daily to twice daily and added 4 units of NovoLog with meals and also add a sliding scale -Currently getting D5 and LR at 125 MLS per hour given that she continues to be nauseous and vomiting -Will add Pantoprazole 40 mg po Daily GI is recommended changing this to IV twice daily -GI recommending Advancing Diet as Tolerated but since she  cannot eat properly will put her on a full liquid diet  Dysphagia and Odynophagia likely in the setting of some esophageal motility disorder -Has been recently evaluated in the ED multiple times for chest pain which she attributes to food getting stuck -Started after COVID and she was prescribed antibiotic and steroid inhaler -Troponins have been flat -GI has been consulted and they suspect esophageal candidiasis as well as a component of gastroparesis -SLP to evaluate bedside and recommending esophagram after her EGD; esophagram done and showed "Barium tablet failed to pass the distal esophagus. Tablet lodged approximately 4-5 cm from the GE junction. Suspicion of occult mucosal inflammation or stricturing at this level. No additional evidence mucosal irregularity, stricture, or mass." -GI recommending IV PPI twice daily and famotidine; GI recommending changing PPI po 40 mg Daily however now changed her back to IV PPI twice daily -Continue with antiemetics -Patient is to undergo an EGD to rule out erosive esophagitis versus candidiasis esophagitis and PUD: EGD done and showed some gastritis and no structural issues with esophagus -She will need eventual gastric emptying study as an outpatient to rule out diabetic gastroparesis -We will give her scopolamine patch and initiate IV Reglan 5 mg but increased to 10 mg q8h -Appreciate specialist recommendations and she underwent EGD and showed some Gastritis  -GI was reconsulted and they feel that she may have some underlying esophageal motility disorder that explains her symptoms but they are recommending weaning down opiates first as this can cause achalasia/esophageal gastric junction outlet obstruction and recommending a trial of viscous lidocaine and lidocaine patch  Chest Pain -She has had 2 weeks of chest pain that has been evaluated multiple times in the ED -Troponins been flat -She had a CTA done and lungs are clear and normal mediastinum on  CTA and the pain is central and sharp and radiates to the sides and to the back and is worse with swallowing and eating -GI cocktail and Pepcid were trialed but she is now on IV PPI and transitioning to po Daily -GI recommending trying 10 mL of GI cocktail prior to meals -Gastroenterology evaluating and planning for EGD and as below -SLP evaluated and recommending esophagram which is going to be done tomorrow -Question if the patient is malingering given that she has 3 kids at home and getting the benefits of her mother to Her kids while she is hospitalized -Psychiatry has been consulted to evaluate given that she has significant anxiety and possible somatic complaints related to her anxiety  Anxiety and Insomnia -Continue with Hydroxyzine and Zolpidem as needed -Psychiatry consulted and there is questionable malingering  ? Malingering -Patient has 3 kids at home and patient's mother is taking care of the kids however she stated to the physical therapist that if she is not hospitalized that the patient's mother will not take care of the kids -Patient continues to be significantly anxious and a little depressed appearing; question if she is manifesting her psychiatric symptoms into somatic complaints -Avoid narcotic medications and have discontinued her IV Dilaudid -TOC has been consulted for further evaluation recommendations -  Psych is consulted for further evaluation recommendation  Chronic Back Pain with Radiculopathy -Underwent a laminectomy and will continue her home gabapentin -DG Lumbar X-Ray as above  -Resumed home narcotic medications with oxycodone and added as needed breakthrough to codon as we will avoid narcotic medications in this patient.  Patient continues to cry and complain that she is in significant pain though she appears comfortable at times and there is question that she is pain seeking  Hypophosphatemia -Mild with phosphorus level is now 4.2 -Continue monitor and  replete as necessary -Repeat phosphorus level in a.m  Hypomagnesemia -Patient's Mag Level was 2.0 -Replete with IV Mag Sulfate 2 grams, yesterday -Continue to Monitor and Replete as Necessary -Repeat CMP in the AM   Hyponatremia -Mild and in the setting of hyperglycemia -Na+ went from 132 -> 134 is improved to 136 yesterday and today it is 138 -Continue monitor and trend and repeat CMP this evening  Tobacco abuse -Smoking cessation counseling given  Unwitnessed Fall -Laying on the Left Side on the Floor. Was ambulating back from the Restroom and States she tripped and fell on her Left Side -Happened after EGD -Complains of Tenderness Left Side of Her Hip, Head, and complaining of Shoulder Pain -Order Head CT w/o Contrast and it was normal, DG Left Shoulder, is negative and DG Pelivs and Left Hip negative -She complained of lumbar pain as well and so lumbar x-ray done yesterday showed No acute osseous abnormality.  Mild disc space narrowing at L5-S1 -Fall precautions -Will get PT/OT to evaluate and Treat and they are recommending home health  Broken tooth -Patient started having tooth pain and had part of her tooth break off today for unclear reasons as she is n.p.o. -We will order an orthopantogram -May need in-house dentistry to further evaluate -Try Magic mouthwash with lidocaine  DVT prophylaxis: Enoxaparin 40 mg subcu every 24 Code Status: FULL CODE  Family Communication: No family present at bedside Disposition Plan: Pending further clinical improvement and evaluation by gastroenterology; Will need to ensure she can tolerate diet properly without Nausea and Vomiting prior to D/C plan: She continues to have esophageal issues given that her barium tablet got stuck  Status is: Inpatient  Remains inpatient appropriate because:Unsafe d/c plan, IV treatments appropriate due to intensity of illness or inability to take PO, and Inpatient level of care appropriate due to severity  of illness  Dispo: The patient is from: Home              Anticipated d/c is to: Home              Patient currently is not medically stable to d/c.   Difficult to place patient No  Consultants:  Gastroenterology Psychiatry  Procedures:  EGD Findings:      The examined esophagus was normal with well-defined Z-line at 38 cm,       examined by NBI. No strictures. Biopsies were obtained from the       proximal, mid and distal esophagus with cold forceps for histology to       r/o EoE.      Scattered mild inflammation characterized by erythema was found in the       gastric body and in the gastric antrum. Biopsies were taken with a cold       forceps for histology. The stomach was somewhat J-shaped, patulous with       decreased tone. No outlet obstruction. (Few endoscopic pictures were not  retrieved d/t technical reasons). Multiple biopsies were taken from       throughout the stomach for histology ([place] protocol)      The examined duodenum was normal. Biopsies for histology were taken with       a cold forceps for evaluation of celiac disease. Impression:               - Normal esophagus. Biopsied.                           - Gastritis. Biopsied.                           - Normal examined duodenum. Biopsied. Recommendation:           - Return patient to hospital ward for ongoing care.                           - Advance diet as tolerated.                           - Continue present medications. Please add Protonix                            40 mg p.o. once a day                           - Await pathology results.                           - The findings and recommendations were discussed                            with the patient.                           - Best possible control of diabetes.                           - Continue symptomatic management. If still with                            epigastric pain, consider US Abdo.                           - FU GI as  outpt in 4-6 weeks.                           - Consider solid-phase gastric emptying scan as                            outpatient off Reglan x 7 days                           - Will sign off for now.  Antimicrobials:  Anti-infectives (From admission, onward)    None        Subjective: Seen and examined at bedside she continues to complain that  she cannot properly swallow.  She appears calm comfortable however continues currently with significant back pain.  She starts crying and states that her pain is uncontrolled but she appears comfortable.  She continually asked for Dilaudid.  No nausea or vomiting.  No other concerns or complaints at this time.  Objective: Vitals:   05/31/21 0802 05/31/21 1150 05/31/21 1600 05/31/21 2125  BP: (!) 155/99 128/84 126/86 (!) 157/96  Pulse: 79 95 98 84  Resp: 18 19 17 16   Temp: 98.6 F (37 C) 98.8 F (37.1 C) 98.5 F (36.9 C) 98.5 F (36.9 C)  TempSrc: Oral Oral Oral Oral  SpO2: 100% 100% 98% 95%  Weight:      Height:       No intake or output data in the 24 hours ending 06/01/21 1858  Filed Weights   05/28/21 1858  Weight: 74.8 kg   Examination: Physical Exam:  Constitutional: WN/WD Hispanic female who continues to be extremely anxious and complains about being in significant mount of back pain but appears comfortable lying in the bed.  Eyes: Lids and conjunctivae normal, sclerae anicteric  ENMT: External Ears, Nose appear normal. Grossly normal hearing.  Neck: Appears normal, supple, no cervical masses, normal ROM, no appreciable thyromegaly; no appreciable JVD Respiratory: Diminished to auscultation bilaterally, no wheezing, rales, rhonchi or crackles. Normal respiratory effort and patient is not tachypenic. No accessory muscle use.  Unlabored breathing Cardiovascular: RRR, no murmurs / rubs / gallops. S1 and S2 auscultated. No extremity edema.  Abdomen: Soft, non-tender, non-distended.  Bowel sounds positive.  GU:  Deferred. Musculoskeletal: No clubbing / cyanosis of digits/nails. No joint deformity upper and lower extremities.  Skin: No rashes, lesions, ulcers. No induration; Warm and dry.  Neurologic: CN 2-12 grossly intact with no focal deficits. Romberg sign and cerebellar reflexes not assessed.  Psychiatric: Normal judgment and insight. Alert and oriented x 3.  Anxious and tearful mood and appropriate affect.   Data Reviewed: I have personally reviewed following labs and imaging studies  CBC: Recent Labs  Lab 05/28/21 1900 05/28/21 1921 05/29/21 0327 05/29/21 1018 05/30/21 0133 05/31/21 0354 06/01/21 0217  WBC 9.7  --  8.6 6.4 5.8 7.7 6.7  NEUTROABS 6.1  --   --  2.5 2.1  --  2.9  HGB 15.8*   < > 13.3 13.1 13.5 12.1 12.9  HCT 47.2*   < > 39.3 38.5 40.1 36.4 38.7  MCV 85.7  --  85.6 84.8 86.1 86.5 86.8  PLT 277  --  237 216 188 171 188   < > = values in this interval not displayed.    Basic Metabolic Panel: Recent Labs  Lab 05/29/21 0303 05/29/21 0327 05/29/21 1018 05/30/21 0133 05/31/21 0354 05/31/21 0403 06/01/21 0217  NA 133*  --  132* 134* 136  --  138  K 3.4*  --  3.7 5.2* 4.1  --  4.6  CL 102  --  104 104 108  --  104  CO2 22  --  22 23 23   --  29  GLUCOSE 152*  --  178* 345* 278*  --  139*  BUN 7  --  <5* 5* 6  --  6  CREATININE 0.38*  --  0.35* 0.50 0.51  --  0.62  CALCIUM 9.0  --  8.2* 8.6* 8.4*  --  8.5*  MG  --  1.5* 1.8 1.9  --  1.6* 2.0  PHOS  --   --  2.3* 3.1  --  3.7 4.2    GFR: Estimated Creatinine Clearance: 114.9 mL/min (by C-G formula based on SCr of 0.62 mg/dL). Liver Function Tests: Recent Labs  Lab 05/29/21 1018 05/30/21 0133 06/01/21 0217  AST ALT ALKPHOS 72 72 59  BILITOT 0.7 0.9 0.7  PROT 5.6* 5.7* 5.6*  ALBUMIN 2.8* 2.7* 2.8*    Recent Labs  Lab 05/28/21 1937  LIPASE 19    No results for input(s): AMMONIA in the last 168 hours. Coagulation Profile: No results for input(s): INR, PROTIME in the last 168  hours. Cardiac Enzymes: No results for input(s): CKTOTAL, CKMB, CKMBINDEX, TROPONINI in the last 168 hours. BNP (last 3 results) No results for input(s): PROBNP in the last 8760 hours. HbA1C: No results for input(s): HGBA1C in the last 72 hours.  CBG: Recent Labs  Lab 05/31/21 2120 06/01/21 0806 06/01/21 1222 06/01/21 1411 06/01/21 1549  GLUCAP 101* 238* 62* 86 92    Lipid Profile: No results for input(s): CHOL, HDL, LDLCALC, TRIG, CHOLHDL, LDLDIRECT in the last 72 hours. Thyroid Function Tests: No results for input(s): TSH, T4TOTAL, FREET4, T3FREE, THYROIDAB in the last 72 hours. Anemia Panel: No results for input(s): VITAMINB12, FOLATE, FERRITIN, TIBC, IRON, RETICCTPCT in the last 72 hours. Sepsis Labs: No results for input(s): PROCALCITON, LATICACIDVEN in the last 168 hours.  Recent Results (from the past 240 hour(s))  Resp Panel by RT-PCR (Flu A&B, Covid) Nasopharyngeal Swab     Status: None   Collection Time: 05/28/21  7:37 PM   Specimen: Nasopharyngeal Swab; Nasopharyngeal(NP) swabs in vial transport medium  Result Value Ref Range Status   SARS Coronavirus 2 by RT PCR NEGATIVE NEGATIVE Final    Comment: (NOTE) SARS-CoV-2 target nucleic acids are NOT DETECTED.  The SARS-CoV-2 RNA is generally detectable in upper respiratory specimens during the acute phase of infection. The lowest concentration of SARS-CoV-2 viral copies this assay can detect is 138 copies/mL. A negative result does not preclude SARS-Cov-2 infection and should not be used as the sole basis for treatment or other patient management decisions. A negative result may occur with  improper specimen collection/handling, submission of specimen other than nasopharyngeal swab, presence of viral mutation(s) within the areas targeted by this assay, and inadequate number of viral copies(<138 copies/mL). A negative result must be combined with clinical observations, patient history, and  epidemiological information. The expected result is Negative.  Fact Sheet for Patients:  BloggerCourse.com  Fact Sheet for Healthcare Providers:  SeriousBroker.it  This test is no t yet approved or cleared by the [place] FDA and  has been authorized for detection and/or diagnosis of SARS-CoV-2 by FDA under an Emergency Use Authorization (EUA). This EUA will remain  in effect (meaning this test can be used) for the duration of the COVID-19 declaration under Section 564(b)(1) of the Act, 21 U.S.C.section 360bbb-3(b)(1), unless the authorization is terminated  or revoked sooner.       Influenza A by PCR NEGATIVE NEGATIVE Final   Influenza B by PCR NEGATIVE NEGATIVE Final    Comment: (NOTE) The Xpert Xpress SARS-CoV-2/FLU/RSV plus assay is intended as an aid in the diagnosis of influenza from Nasopharyngeal swab specimens and should not be used as a sole basis for treatment. Nasal washings and aspirates are unacceptable for Xpert Xpress SARS-CoV-2/FLU/RSV testing.  Fact Sheet for Patients: BloggerCourse.com  Fact Sheet for Healthcare Providers: SeriousBroker.it  This test is not yet approved or cleared by the [place] FDA and has been  authorized for detection and/or diagnosis of SARS-CoV-2 by FDA under an Emergency Use Authorization (EUA). This EUA will remain in effect (meaning this test can be used) for the duration of the COVID-19 declaration under Section 564(b)(1) of the Act, 21 U.S.C. section 360bbb-3(b)(1), unless the authorization is terminated or revoked.  Performed at [facility], [address] [place], [place] [address]      RN Pressure Injury Documentation:     Estimated body mass index is 23.01 kg/m as calculated from the following:   Height as of this encounter:  (1.803 m).   Weight as of this encounter: 74.8  kg.  Malnutrition Type:   Malnutrition Characteristics:   Nutrition Interventions:   Radiology Studies: DG Lumbar Spine 2-3 Views  Result Date: 05/30/2021 CLINICAL DATA:  Unwitnessed fall EXAM: LUMBAR SPINE - 2-3 VIEW COMPARISON:  MRI 02/26/2013 FINDINGS: There is no evidence of lumbar spine fracture. Alignment is normal. Mild disc space narrowing at L5-S1. IUD in the pelvis. IMPRESSION: No acute osseous abnormality.  Mild disc space narrowing at L5-S1 Electronically Signed   By: Jasmine Pang M.D.   On: 05/30/2021 21:06   CT HEAD WO CONTRAST ( )  Result Date: 05/30/2021 CLINICAL DATA:  Head trauma EXAM: CT HEAD WITHOUT CONTRAST TECHNIQUE: Contiguous axial images were obtained from the base of the skull through the vertex without intravenous contrast. COMPARISON:  06/22/2013 FINDINGS: Brain: No evidence of acute infarction, hemorrhage, hydrocephalus, extra-axial collection or mass lesion/mass effect. Vascular: No hyperdense vessel or unexpected calcification. Skull: Normal. Negative for fracture or focal lesion. Sinuses/Orbits: Partial opacification of the right maxillary sinus. Visualized paranasal sinuses and mastoid air cells are otherwise clear. Other: None. IMPRESSION: Normal head CT. Electronically Signed   By: Charline Bills M.D.   On: 05/30/2021 23:12   DG Shoulder Left  Result Date: 05/30/2021 CLINICAL DATA:  Unwitnessed fall EXAM: LEFT SHOULDER - 2+ VIEW COMPARISON:  None. FINDINGS: There is no evidence of fracture or dislocation. There is no evidence of arthropathy or other focal bone abnormality. Soft tissues are unremarkable. IMPRESSION: Negative. Electronically Signed   By: Jasmine Pang M.D.   On: 05/30/2021 21:05   DG HIP UNILAT WITH PELVIS 2-3 VIEWS LEFT  Result Date: 05/30/2021 CLINICAL DATA:  Fall and left hip pain. EXAM: DG HIP (WITH OR WITHOUT PELVIS) 2-3V LEFT COMPARISON:  None. FINDINGS: There is no evidence of hip fracture or dislocation. There is no evidence of  arthropathy or other focal bone abnormality. IMPRESSION: Negative. Electronically Signed   By: Elgie Collard M.D.   On: 05/30/2021 21:03   DG ESOPHAGUS W SINGLE CM (SOL OR THIN BA)  Result Date: 06/01/2021 CLINICAL DATA:  Recent history of esophagitis. EXAM: ESOPHOGRAM / BARIUM SWALLOW / BARIUM TABLET STUDY TECHNIQUE: Single contrast examination performed using thin barium liquid. The patient was observed with fluoroscopy swallowing a 13 mm barium sulphate tablet. FLUOROSCOPY TIME:  Fluoroscopy Time:  2 minutes 12 seconds Radiation Exposure Index (if provided by the fluoroscopic device): 2 Number of Acquired Spot Images: 28.3 mGy COMPARISON:  None. FINDINGS: Exam is limited by patient weakness. Patient was unable to stand. Patient was evaluated in semi upright position. Single contrast examination of the esophagus demonstrated no obvious mucosal irregularity, stricture or mass. Contrast flows through GE junction easily. No tertiary contractions were identified. Barium tablet was administered which failed to pass the distal esophagus. Tablet lodged proximally 4 cm from the GE junction. There is a subtle thin indentation at this level  suggests potential stricture or web. Tablet failed to pass multiple sips of thin barium. IMPRESSION: 1. Barium tablet failed to pass the distal esophagus. Tablet lodged approximately 4-5 cm from the GE junction. Suspicion of occult mucosal inflammation or stricturing at this level. 2. No additional evidence mucosal irregularity, stricture, or mass. Electronically Signed   By: Genevive Bi M.D.   On: 06/01/2021 13:24    Scheduled Meds:  alum & mag hydroxide-simeth  10 mL Oral TID AC   diclofenac Sodium  2 g Topical QID   enoxaparin (LOVENOX) injection  40 mg Subcutaneous Daily   gabapentin  400 mg Oral TID   influenza vac split quadrivalent PF  0.5 mL Intramuscular Tomorrow-1000   insulin aspart  0-9 Units Subcutaneous TID WC   insulin aspart  4 Units Subcutaneous TID  WC   insulin detemir  10 Units Subcutaneous BID   lidocaine  1 patch Transdermal Q24H   lidocaine  15 mL Mouth/Throat TID AC & HS   lisdexamfetamine  40 mg Oral Daily   metoCLOPramide (REGLAN) injection  10 mg Intravenous Q8H   pantoprazole (PROTONIX) IV  40 mg Intravenous Q12H   scopolamine  1 patch Transdermal Q72H   sucralfate  1 g Oral TID WC & HS   Continuous Infusions:  dextrose 5% lactated ringers 125 mL/hr at 06/01/21 1638   promethazine (PHENERGAN) injection (IM or IVPB) 6.25 mg (06/01/21 0306)    LOS: 3 days   Merlene Laughter, DO [facility] PAGER is on AMION  If 7PM-7AM, please contact night-coverage [email or web address]

## 2021-06-01 NOTE — Consult Note (Addendum)
Reason for Consult: Anxiety/Depression manifesting Somatic Complaints and possible secondary gain Referring Physician: Merlene LaughterSheikh, Omair Latif, DO  Julie [name] is an 30 y.o. female.  HPI:  Julie [name] is a 30 yr old female with PMHx significant for Type 1 diabetes mellitus on insulin pump which recently broke, anxiety, insomnia, chronic low back pain status post lumbar laminectomy  who presented to the ED for evaluation of high blood sugar, chest pain and nausea vomiting.   She reports that she is not doing too well today.  She reports that she actually did sleep fairly well last night.  She reports that she continues to be unable to eat or drink due to the pain in her chest from swallowing.  She reports no SI, HI, or AVH.  She reports that her mood is okay.  She reports that she is very hungry and wants to eat however the pain from swallowing is so great that it makes that nearly impossible to do so.  He reports continuing to have nausea and vomiting.  She reports that the Ambien appears to be helping her sleep.  She reports that the Atarax has been taking the edge off of her anxiety.  She reports continued nausea/vomiting.  She also reports continued hip and back pain from her fall the other day.  She has no other psychiatric complaints at this time.   Past Medical History:  Diagnosis Date   Bulimia nervosa    Dental caries    Diabetes mellitus without complication (HCC)    Genital warts    Noncompliance with diabetes treatment    Pelvic fracture [facility](HCC)     Past Surgical History:  Procedure Laterality Date   BIOPSY  05/30/2021   Procedure: BIOPSY;  Surgeon: Lynann BolognaGupta, Rajesh, MD;  [facility];  Service: Endoscopy;;   CESAREAN SECTION     CESAREAN SECTION  2012   ESOPHAGOGASTRODUODENOSCOPY (EGD) WITH PROPOFOL N/A 05/30/2021   Procedure: ESOPHAGOGASTRODUODENOSCOPY (EGD) WITH PROPOFOL;  Surgeon: Lynann BolognaGupta, Rajesh, MD;  [facility];  Service: Endoscopy;   Laterality: N/A;    Family History  Problem Relation Age of Onset   Diabetes Mother     Social History:  reports that she has been smoking cigarettes. She has never used smokeless tobacco. She reports that she does not currently use drugs. She reports that she does not drink alcohol.  Allergies: No Known Allergies  Medications: I have reviewed the patient's current medications.  Results for orders placed or performed during the hospital encounter of 05/28/21 (from the past 48 hour(s))  Glucose, capillary     Status: None   Collection Time: 05/30/21 11:46 AM  Result Value Ref Range   Glucose-Capillary 83 70 - 99 mg/dL    Comment: Glucose reference range applies only to samples taken after fasting for at least 8 hours.  Glucose, capillary     Status: None   Collection Time: 05/30/21 12:34 PM  Result Value Ref Range   Glucose-Capillary 77 70 - 99 mg/dL    Comment: Glucose reference range applies only to samples taken after fasting for at least 8 hours.  Glucose, capillary     Status: Abnormal   Collection Time: 05/30/21  4:10 PM  Result Value Ref Range   Glucose-Capillary 249 (H) 70 - 99 mg/dL    Comment: Glucose reference range applies only to samples taken after fasting for at least 8 hours.  Glucose, capillary     Status: Abnormal   Collection Time: 05/30/21  8:25 PM  Result Value Ref Range   Glucose-Capillary 150 (H) 70 - 99 mg/dL    Comment: Glucose reference range applies only to samples taken after fasting for at least 8 hours.  Basic metabolic panel     Status: Abnormal   Collection Time: 05/31/21  3:54 AM  Result Value Ref Range   Sodium 136 135 - 145 mmol/L   Potassium 4.1 3.5 - 5.1 mmol/L   Chloride 108 98 - 111 mmol/L   CO2 23 22 - 32 mmol/L   Glucose, Bld 278 (H) 70 - 99 mg/dL    Comment: Glucose reference range applies only to samples taken after fasting for at least 8 hours.   BUN 6 6 - 20 mg/dL   Creatinine, Ser 9.37 0.44 - 1.00 mg/dL   Calcium 8.4 (L) 8.9 -  10.3 mg/dL   GFR, Estimated >16 >96 mL/min    Comment: (NOTE) Calculated using the CKD-EPI Creatinine Equation (2021)    Anion gap 5 5 - 15    Comment: Performed at [facility], [address], [place], [place] [address]  CBC     Status: None   Collection Time: 05/31/21  3:54 AM  Result Value Ref Range   WBC 7.7 4.0 - 10.5 K/uL   RBC 4.21 3.87 - 5.11 MIL/uL   Hemoglobin 12.1 12.0 - 15.0 g/dL   HCT 10.1 75.1 - 02.5 %   MCV 86.5 80.0 - 100.0 fL   MCH 28.7 26.0 - 34.0 pg   MCHC 33.2 30.0 - 36.0 g/dL   RDW 85.2 77.8 - 24.2 %   Platelets 171 150 - 400 K/uL   nRBC 0.0 0.0 - 0.2 %    Comment: Performed at [facility], [address] [place], [place] [address]  Magnesium     Status: Abnormal   Collection Time: 05/31/21  4:03 AM  Result Value Ref Range   Magnesium 1.6 (L) 1.7 - 2.4 mg/dL    Comment: Performed at [facility], [address], [place], [place] [address]  Phosphorus     Status: None   Collection Time: 05/31/21  4:03 AM  Result Value Ref Range   Phosphorus 3.7 2.5 - 4.6 mg/dL    Comment: Performed at [facility], [address], [place], [place] [address]  Glucose, capillary     Status: Abnormal   Collection Time: 05/31/21  8:00 AM  Result Value Ref Range   Glucose-Capillary 235 (H) 70 - 99 mg/dL    Comment: Glucose reference range applies only to samples taken after fasting for at least 8 hours.  Glucose, capillary     Status: Abnormal   Collection Time: 05/31/21 11:49 AM  Result Value Ref Range   Glucose-Capillary 103 (H) 70 - 99 mg/dL    Comment: Glucose reference range applies only to samples taken after fasting for at least 8 hours.  Glucose, capillary     Status: Abnormal   Collection Time: 05/31/21  3:56 PM  Result Value Ref Range   Glucose-Capillary 367 (H) 70 - 99 mg/dL    Comment: Glucose reference range applies only to samples taken after fasting for at least 8 hours.  Glucose, capillary     Status: Abnormal   Collection Time:  05/31/21  9:20 PM  Result Value Ref Range   Glucose-Capillary 101 (H) 70 - 99 mg/dL    Comment: Glucose reference range applies only to samples taken after fasting for at least 8 hours.  CBC with Differential/Platelet     Status: None   Collection Time: 06/01/21  2:17 AM  Result Value Ref Range   WBC 6.7 4.0 - 10.5 K/uL   RBC 4.46 3.87 - 5.11 MIL/uL   Hemoglobin 12.9 12.0 - 15.0 g/dL   HCT 11.9 14.7 - 82.9 %   MCV 86.8 80.0 - 100.0 fL   MCH 28.9 26.0 - 34.0 pg   MCHC 33.3 30.0 - 36.0 g/dL   RDW 56.2 13.0 - 86.5 %   Platelets 188 150 - 400 K/uL   nRBC 0.0 0.0 - 0.2 %   Neutrophils Relative % 43 %   Neutro Abs 2.9 1.7 - 7.7 K/uL   Lymphocytes Relative 45 %   Lymphs Abs 3.0 0.7 - 4.0 K/uL   Monocytes Relative 8 %   Monocytes Absolute 0.5 0.1 - 1.0 K/uL   Eosinophils Relative 3 %   Eosinophils Absolute 0.2 0.0 - 0.5 K/uL   Basophils Relative 0 %   Basophils Absolute 0.0 0.0 - 0.1 K/uL   Immature Granulocytes 1 %   Abs Immature Granulocytes 0.03 0.00 - 0.07 K/uL    Comment: Performed at [facility], [address], [place], [place] [address]  Comprehensive metabolic panel     Status: Abnormal   Collection Time: 06/01/21  2:17 AM  Result Value Ref Range   Sodium 138 135 - 145 mmol/L   Potassium 4.6 3.5 - 5.1 mmol/L    Comment: SPECIMEN HEMOLYZED. HEMOLYSIS MAY AFFECT INTEGRITY OF RESULTS.   Chloride 104 98 - 111 mmol/L   CO2 29 22 - 32 mmol/L   Glucose, Bld 139 (H) 70 - 99 mg/dL    Comment: Glucose reference range applies only to samples taken after fasting for at least 8 hours.   BUN 6 6 - 20 mg/dL   Creatinine, Ser 6.29 0.44 - 1.00 mg/dL   Calcium 8.5 (L) 8.9 - 10.3 mg/dL   Total Protein 5.6 (L) 6.5 - 8.1 g/dL   Albumin 2.8 (L) 3.5 - 5.0 g/dL   AST 27 15 - 41 U/L   ALT 11 0 - 44 U/L   Alkaline Phosphatase 59 38 - 126 U/L   Total Bilirubin 0.7 0.3 - 1.2 mg/dL   GFR, Estimated >52 >84 mL/min    Comment: (NOTE) Calculated using the CKD-EPI Creatinine Equation  (2021)    Anion gap 5 5 - 15    Comment: Performed at [facility], [address] [place], [place] [address]  Magnesium     Status: None   Collection Time: 06/01/21  2:17 AM  Result Value Ref Range   Magnesium 2.0 1.7 - 2.4 mg/dL    Comment: Performed at [facility], [address] [place], [place] [address]  Phosphorus     Status: None   Collection Time: 06/01/21  2:17 AM  Result Value Ref Range   Phosphorus 4.2 2.5 - 4.6 mg/dL    Comment: Performed at [facility], [address], [place], [place] [address]    DG Lumbar Spine 2-3 Views  Result Date: 05/30/2021 CLINICAL DATA:  Unwitnessed fall EXAM: LUMBAR SPINE - 2-3 VIEW COMPARISON:  MRI 02/26/2013 FINDINGS: There is no evidence of lumbar spine fracture. Alignment is normal. Mild disc space narrowing at L5-S1. IUD in the pelvis. IMPRESSION: No acute osseous abnormality.  Mild disc space narrowing at L5-S1 Electronically Signed   By: Jasmine Pang M.D.   On: 05/30/2021 21:06  CT HEAD WO CONTRAST ( )  Result Date: 05/30/2021 CLINICAL DATA:  Head trauma EXAM: CT HEAD WITHOUT CONTRAST TECHNIQUE: Contiguous axial images were obtained from the base of the skull through the vertex without intravenous contrast. COMPARISON:  06/22/2013 FINDINGS: Brain: No evidence of acute infarction, hemorrhage, hydrocephalus, extra-axial collection or mass lesion/mass effect. Vascular: No hyperdense vessel or unexpected calcification. Skull: Normal. Negative for fracture or focal lesion. Sinuses/Orbits: Partial opacification of the right maxillary sinus. Visualized paranasal sinuses and mastoid air cells are otherwise clear. Other: None. IMPRESSION: Normal head CT. Electronically Signed   By: Charline Bills M.D.   On: 05/30/2021 23:12   DG Shoulder Left  Result Date: 05/30/2021 CLINICAL DATA:  Unwitnessed fall EXAM: LEFT SHOULDER - 2+ VIEW COMPARISON:  None. FINDINGS: There is no evidence of fracture or dislocation. There is no  evidence of arthropathy or other focal bone abnormality. Soft tissues are unremarkable. IMPRESSION: Negative. Electronically Signed   By: Jasmine Pang M.D.   On: 05/30/2021 21:05   DG HIP UNILAT WITH PELVIS 2-3 VIEWS LEFT  Result Date: 05/30/2021 CLINICAL DATA:  Fall and left hip pain. EXAM: DG HIP (WITH OR WITHOUT PELVIS) 2-3V LEFT COMPARISON:  None. FINDINGS: There is no evidence of hip fracture or dislocation. There is no evidence of arthropathy or other focal bone abnormality. IMPRESSION: Negative. Electronically Signed   By: Elgie Collard M.D.   On: 05/30/2021 21:03    Review of Systems  Respiratory:  Positive for chest tightness. Negative for shortness of breath.   Cardiovascular:  Positive for chest pain.  Gastrointestinal:  Positive for nausea and vomiting. Negative for abdominal pain.  Musculoskeletal:  Positive for back pain.  Neurological:  Negative for weakness and headaches.  Psychiatric/Behavioral:  Positive for sleep disturbance (improving). Negative for agitation, hallucinations and suicidal ideas. The patient is nervous/anxious.   Blood pressure (!) 157/96, pulse 84, temperature 98.5 F (36.9 C), temperature source Oral, resp. rate 16, height 5\' 11"  (1.803 m), weight 74.8 kg, SpO2 95 %. Physical Exam Vitals and nursing note reviewed.  Constitutional:      General: She is not in acute distress.    Appearance: She is well-developed and normal weight. She is ill-appearing. She is not toxic-appearing.  HENT:     Head: Normocephalic and atraumatic.  Pulmonary:     Effort: Pulmonary effort is normal.  Musculoskeletal:        General: Normal range of motion.  Neurological:     General: No focal deficit present.     Mental Status: She is alert.    06/01/21 1252  Presentation  General Appearance Disheveled;Appropriate for Environment  Eye Contact Fair  Speech Clear and Coherent;Normal Rate  Speech Volume Normal  Mood and Affect  Mood Euthymic  Affect Non-Congruent   Thought Processes  Thought Process Coherent  Descriptions of Associations Intact  Orientation Full (Time, Place and Person)  Thought Content Logical;WDL  Hallucinations None  Ideas of Reference None  Suicidal Thoughts No  Homicidal Thoughts No  Sensorium  Memory Immediate Fair;Recent Fair  Judgment Poor  Insight Poor  Executive Functions  Concentration Good  Attention Span Good  Recall Good  Fund of Knowledge Good  Language Good  Psychomotor Activity  Psychomotor Activity Normal  Assets  Assets Communication Skills;Resilience  Sleep  Sleep Fair    Assessment/Plan: Case and Plan discussed with Dr. 08/01/21  Julie [name] is a 30 yr old female with PMHx significant for Type 1 diabetes mellitus on insulin pump which recently  broke, anxiety, insomnia, chronic low back pain status post lumbar laminectomy  who presented to the ED for evaluation of high blood sugar, chest pain and nausea vomiting.   Currently she is stable from a psychiatric standpoint.  There is a question of secondary gain as she did report that her mother is watching her 3 kids only because she is in the hospital currently, however, this could be Somatization.  At this point in time would not recommend any medication changes from a psychiatric standpoint.  It could be that with her improved sleep her symptoms may improve.  We will continue to monitor.   Anxiety Insomnia: -Continue Ambien 5 mg QHS PRN -Continue Atarax 25 mg TID PRN   Secondary gain? Vs Somatization: -Reports her mother is watching the children only because she is in the hospital   Continue rest of care per Primary Team Psychiatry will continue to follow the patient   Disposition: Do not recommend inpatient psychiatric care at this time  Lauro Franklin 06/01/2021, 7:57 AM

## 2021-06-02 DIAGNOSIS — R1310 Dysphagia, unspecified: Secondary | ICD-10-CM

## 2021-06-02 DIAGNOSIS — F418 Other specified anxiety disorders: Secondary | ICD-10-CM | POA: Diagnosis not present

## 2021-06-02 DIAGNOSIS — R0789 Other chest pain: Secondary | ICD-10-CM | POA: Diagnosis not present

## 2021-06-02 DIAGNOSIS — E1010 Type 1 diabetes mellitus with ketoacidosis without coma: Secondary | ICD-10-CM | POA: Diagnosis not present

## 2021-06-02 DIAGNOSIS — R1013 Epigastric pain: Secondary | ICD-10-CM | POA: Diagnosis not present

## 2021-06-02 LAB — GLUCOSE, CAPILLARY
Glucose-Capillary: 237 mg/dL — ABNORMAL HIGH (ref 70–99)
Glucose-Capillary: 310 mg/dL — ABNORMAL HIGH (ref 70–99)
Glucose-Capillary: 121 mg/dL — ABNORMAL HIGH (ref 70–99)
Glucose-Capillary: 206 mg/dL — ABNORMAL HIGH (ref 70–99)

## 2021-06-02 LAB — CBC WITH DIFFERENTIAL/PLATELET
Abs Immature Granulocytes: 0.02 10*3/uL (ref 0.00–0.07)
Basophils Absolute: 0 10*3/uL (ref 0.0–0.1)
Basophils Relative: 0 %
Eosinophils Absolute: 0.3 10*3/uL (ref 0.0–0.5)
Eosinophils Relative: 4 %
HCT: 39.5 % (ref 36.0–46.0)
Hemoglobin: 13 g/dL (ref 12.0–15.0)
Immature Granulocytes: 0 %
Lymphocytes Relative: 40 %
Lymphs Abs: 2.4 10*3/uL (ref 0.7–4.0)
MCH: 28.6 pg (ref 26.0–34.0)
MCHC: 32.9 g/dL (ref 30.0–36.0)
MCV: 86.8 fL (ref 80.0–100.0)
Monocytes Absolute: 0.5 10*3/uL (ref 0.1–1.0)
Monocytes Relative: 8 %
Neutro Abs: 2.9 10*3/uL (ref 1.7–7.7)
Neutrophils Relative %: 48 %
Platelets: 196 10*3/uL (ref 150–400)
RBC: 4.55 MIL/uL (ref 3.87–5.11)
RDW: 12.6 % (ref 11.5–15.5)
WBC: 6.1 10*3/uL (ref 4.0–10.5)
nRBC: 0 % (ref 0.0–0.2)

## 2021-06-02 LAB — COMPREHENSIVE METABOLIC PANEL
ALT: 11 U/L (ref 0–44)
AST: 17 U/L (ref 15–41)
Albumin: 2.7 g/dL — ABNORMAL LOW (ref 3.5–5.0)
Alkaline Phosphatase: 66 U/L (ref 38–126)
Anion gap: 5 (ref 5–15)
BUN: <5 mg/dL — ABNORMAL LOW (ref 6–20)
CO2: 31 mmol/L (ref 22–32)
Calcium: 8.8 mg/dL — ABNORMAL LOW (ref 8.9–10.3)
Chloride: 101 mmol/L (ref 98–111)
Creatinine, Ser: 0.53 mg/dL (ref 0.44–1.00)
GFR, Estimated: >60 mL/min (ref >60)
Glucose, Bld: 265 mg/dL — ABNORMAL HIGH (ref 70–99)
Potassium: 4.3 mmol/L (ref 3.5–5.1)
Sodium: 137 mmol/L (ref 135–145)
Total Bilirubin: 0.5 mg/dL (ref 0.3–1.2)
Total Protein: 5.8 g/dL — ABNORMAL LOW (ref 6.5–8.1)

## 2021-06-02 LAB — SURGICAL PATHOLOGY

## 2021-06-02 LAB — MAGNESIUM: Magnesium: 1.6 mg/dL — ABNORMAL LOW (ref 1.7–2.4)

## 2021-06-02 LAB — PHOSPHORUS: Phosphorus: 4.4 mg/dL (ref 2.5–4.6)

## 2021-06-02 MED ORDER — FLUCONAZOLE 150 MG PO TABS
150.0000 mg | ORAL_TABLET | Freq: Once | ORAL | Status: AC
  Administered 2021-06-03: 150 mg via ORAL
  Filled 2021-06-02 (×2): qty 1

## 2021-06-02 MED ORDER — ONDANSETRON HCL 4 MG/2ML IJ SOLN: 4.0000 mg | Freq: Three times a day (TID) | INTRAMUSCULAR | Status: DC | PRN

## 2021-06-02 MED ORDER — MAGNESIUM SULFATE 2 GM/50ML IV SOLN
2.0000 g | Freq: Once | INTRAVENOUS | Status: AC
  Administered 2021-06-02: 2 g via INTRAVENOUS
  Filled 2021-06-02: qty 50

## 2021-06-02 NOTE — Progress Notes (Signed)
PROGRESS NOTE    Julie [name]  ZOX:[id number] DOB: 1990-10-02 DOA: 05/28/2021 PCP: [facility]  Brief Narrative:  The patient is a 30 year old Hispanic female with a past medical history significant for but not limited to type 1 diabetes mellitus on insulin pump which recently broke, anxiety, insomnia, chronic low back pain status post lumbar laminectomy as well as other comorbidities who presented to the ED for evaluation of high blood sugar, chest pain and nausea vomiting.  She reports that she been taking 15 units of Levemir every night and 6 units in the low 3 times daily but her glucometer has been reading "high" last 2 weeks.  She is also experiencing severe sharp central chest pain over the same interval worse if she tries to eat or swallow anything.  She had 2 episodes of blood-tinged sputum roughly a week ago and she has had a some shortness of breath with minimal cough.  She reports that she has been unable to eat the last week due to her chest pain nausea vomiting and feels like her food gets stuck.  She denies any lower extremity swelling or tenderness.  Denies abdominal pain or diarrhea.  On arrival to the hospital she was tachycardic into the 130s.  CTA chest was negative for PE, acute cardiopulmonary disease or acute mediastinal findings.  Her initial blood sugar was 619 and bicarb was 15.  High-sensitivity troponins were undetectable and lipase was normal.  Urinalysis was done and did show glucosuria and ketonuria.  She is given 1-1/2 L of LR and 20" IV potassium and pain medication with IV Dilaudid and started on insulin infusion and transferred to [facility] for evaluation management of DKA and intractable nausea vomiting.  Because she had issues with her swallowing GI was consulted and they are planning an EGD and was done and showed a normal esophagus and gastritis that was biopsied. GI recommends adding po Protonix and controlling Diabetes.  Patient has not  advance her diet and only taking liquid diet given that a soft diet causes her chest discomfort and pain while she swallows.  SLP reevaluated and recommending esophagram and this showed that the barium tablet failed a past to the distal esophagus and was lodged 4 to 5 cm from the GE junction.  GI was reconsulted and recommended trial of viscous lidocaine.  Her diet is slowly being advanced and we have weaned her off of IV narcotics.  Patient continues to still be nauseous however we feel that she is attempting to get secondary gain.  If she is able to tolerate a diet without issues and pain we can safely discharge her home.  PT OT recommending home health.   Assessment & Plan:   Principal Problem:   DKA, type 1 (HCC) Active Problems:   Depression with anxiety   Chest pain   Abdominal pain, epigastric   Dysphagia  DKA in the setting of type 1 diabetes mellitus Intractable Nausea and Vomiting in the setting likely Diabetic Gastroparesis -She is type 1 diabetes mellitus and her last hemoglobin A1c was 10.9 in July -She presented with hyperglycemia and intractable nausea vomiting and found to have serum glucose is significant with a bicarb of 15 and anion gap of 17 with acute nausea -She is given IV fluid hydration in the ED and boluses and started on insulin infusion -She had been weaned off of the insulin transition to subcu given that her bicarb is now normal x2 -Unfortunately initially was not tolerating oral diet  given that she had intractable nausea vomiting and likely she does have gastroparesis in the setting of her uncontrolled diabetes but now diet is being advanced and she is improving -We will continue supportive care with antiemetics and she states that her Compazine gives her panic attacks and that Zofran is ineffective; she did receive Phenergan suppository.  Try for scopolamine patch and increased Reglan to 10 mg q8h; continue with antiemetics and start to wean; patient is now just  asking for Phenergan IV given that her narcotics have been discontinued but we will stop the Phenergan IV given that she is able to tolerate her clear liquid and full liquid diet earlier without issues -CBGs have been proved slightly and have now been ranging from 62-238 -Continue monitor blood sugars carefully and adjust insulin as needed -We have increased her Levemir from 10 units daily to twice daily and added 4 units of NovoLog with meals and also add a sliding scale -We will stop IV fluid hydration as well. -Will add Pantoprazole 40 mg po Daily GI is recommended changing this to IV twice daily -GI recommending Advancing Diet as Tolerated but since she cannot eat properly will put her on a full liquid diet yesterday and will try soft diet today.  GI recommending continuing viscous lidocaine AC and recommended weaning off narcotics which can create an achalasia type picture or a esophageal gastric junction outlet obstruction -GI is recommended emphasizing lidocaine patch and Voltaren gel over her chest when she swallows  Dysphagia and Odynophagia likely in the setting of some esophageal motility disorder -Has been recently evaluated in the ED multiple times for chest pain which she attributes to food getting stuck -Started after COVID and she was prescribed antibiotic and steroid inhaler -Troponins have been flat -GI has been consulted and they suspect esophageal candidiasis as well as a component of gastroparesis -SLP to evaluate bedside and recommending esophagram after her EGD; esophagram done and showed "Barium tablet failed to pass the distal esophagus. Tablet lodged approximately 4-5 cm from the GE junction. Suspicion of occult mucosal inflammation or stricturing at this level. No additional evidence mucosal irregularity, stricture, or mass." -GI recommending IV PPI twice daily and famotidine; GI recommending changing PPI po 40 mg Daily however now changed her back to IV PPI twice  daily -Continue with antiemetics -Patient is to undergo an EGD to rule out erosive esophagitis versus candidiasis esophagitis and PUD: EGD done and showed some gastritis and no structural issues with esophagus -She will need eventual gastric emptying study as an outpatient to rule out diabetic gastroparesis -We will give her scopolamine patch and initiate IV Reglan 5 mg but increased to 10 mg q8h -Appreciate specialist recommendations and she underwent EGD and showed some Gastritis  -GI was reconsulted and they feel that she may have some underlying esophageal motility disorder that explains her symptoms but they are recommending weaning down opiates first as this can cause achalasia/esophageal gastric junction outlet obstruction and recommending a trial of viscous lidocaine and lidocaine patch -Patient has been tolerating her full liquid diet so we will go to a soft diet; she is no longer asking for IV narcotics but now asking for IV Phenergan.  We have discontinued IV Phenergan as well.  If she can tolerate her diet without issues she can be discharged home with home health PT OT  Chest Pain -She has had 2 weeks of chest pain that has been evaluated multiple times in the ED -Troponins been flat -She had a  CTA done and lungs are clear and normal mediastinum on CTA and the pain is central and sharp and radiates to the sides and to the back and is worse with swallowing and eating -GI cocktail and Pepcid were trialed but she is now on IV PPI and was initially transitioned to p.o. but now gone to twice daily -GI recommending trying 10 mL of GI cocktail prior to meals -Gastroenterology evaluating and planning for EGD and as below -SLP evaluated and recommending esophagram which is going to be done tomorrow -Question if the patient is malingering given that she has 3 kids at home and getting the benefits of her mother to Her kids while she is hospitalized; significantly feel that she is try to get some  secondary gain -Psychiatry has been consulted to evaluate given that she has significant anxiety and possible somatic complaints related to her anxiety; psychiatry has trialed her on Ambien as well as Atarax and they feel that her sleep is improved and anxiety is being controlled by the Atarax and they feel that there is a strong concern for secondary gain as she continues to request IV pain medication and ADHD medications  Anxiety and Insomnia -Continue with Hydroxyzine and Zolpidem as needed -Psychiatry consulted and there is questionable malingering and they feel she is stable now but they do feel that she does have a strong concern for secondary gain  ? Malingering -Patient has 3 kids at home and patient's mother is taking care of the kids however she stated to the physical therapist that if she is not hospitalized that the patient's mother will not take care of the kids -Patient continues to be significantly anxious and a little depressed appearing; question if she is manifesting her psychiatric symptoms into somatic complaints -Avoid narcotic medications and have discontinued her IV Dilaudid -TOC has been consulted for further evaluation recommendations -Psych is consulted for further evaluation recommendation; see as above  Chronic Back Pain with Radiculopathy -Underwent a laminectomy and will continue her home gabapentin -DG Lumbar X-Ray as above  -Resumed home narcotic medications with oxycodone and added as needed breakthrough to codon as we will avoid narcotic medications in this patient.  Patient continues to cry and complain that she is in significant pain though she appears comfortable at times and there is question that she is pain seeking; we have stopped her IV narcotics.  Hypophosphatemia -Mild with phosphorus level is now 4.4 -Continue monitor and replete as necessary -Repeat phosphorus level in a.m  Hypomagnesemia -Patient's Mag Level was 1.6 -Replete with IV Mag Sulfate  2 grams again -Continue to Monitor and Replete as Necessary -Repeat CMP in the AM   Hyponatremia -Mild and in the setting of hyperglycemia -Sodium is now improved and is now 137 -Continue monitor and trend and repeat CMP this evening  Tobacco abuse -Smoking cessation counseling given  Unwitnessed Fall -Laying on the Left Side on the Floor. Was ambulating back from the Restroom and States she tripped and fell on her Left Side -Happened after EGD -Complains of Tenderness Left Side of Her Hip, Head, and complaining of Shoulder Pain -Order Head CT w/o Contrast and it was normal, DG Left Shoulder, is negative and DG Pelivs and Left Hip negative -She complained of lumbar pain as well and so lumbar x-ray done yesterday showed No acute osseous abnormality.  Mild disc space narrowing at L5-S1 -Fall precautions -Will get PT/OT to evaluate and Treat and they are recommending home health  Broken tooth -Patient started having  tooth pain and had part of her tooth break off today for unclear reasons as she is n.p.o. -We will order an orthopantogram; orthopantogram noted and showed "Panorex view of the mandible was performed. There are no acute bony abnormalities. 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 Remaining visualize teeth are unremarkable. Visualized sinuses are clear." -May need in-house dentistry to further evaluate per discussion with Dr. Chales Salmon and after she reviewed the orthopantogram she does not see any signs of periapical infection and thinks that this tooth can be addressed and deferred to being in the outpatient setting when she is discharged given that she is not having swelling or any acute signs infection -Try Magic mouthwash with lidocaine  DVT prophylaxis: Enoxaparin 40 mg subcu every 24 Code Status: FULL CODE  Family Communication: No family present at bedside Disposition Plan: Anticipating discharging home in next 24 to 48 hours if she can tolerate her food without  issues after trying viscous lidocaine and attempting to eat a soft diet.  Status is: Inpatient  Remains inpatient appropriate because:Unsafe d/c plan, IV treatments appropriate due to intensity of illness or inability to take PO, and Inpatient level of care appropriate due to severity of illness  Dispo: The patient is from: Home              Anticipated d/c is to: Home              Patient currently is not medically stable to d/c.   Difficult to place patient No  Consultants:  Gastroenterology Psychiatry  Procedures:  EGD Findings:      The examined esophagus was normal with well-defined Z-line at 38 cm,       examined by NBI. No strictures. Biopsies were obtained from the       proximal, mid and distal esophagus with cold forceps for histology to       r/o EoE.      Scattered mild inflammation characterized by erythema was found in the       gastric body and in the gastric antrum. Biopsies were taken with a cold       forceps for histology. The stomach was somewhat J-shaped, patulous with       decreased tone. No outlet obstruction. (Few endoscopic pictures were not       retrieved d/t technical reasons). Multiple biopsies were taken from       throughout the stomach for histology ([place] protocol)      The examined duodenum was normal. Biopsies for histology were taken with       a cold forceps for evaluation of celiac disease. Impression:               - Normal esophagus. Biopsied.                           - Gastritis. Biopsied.                           - Normal examined duodenum. Biopsied. Recommendation:           - Return patient to hospital ward for ongoing care.                           - Advance diet as tolerated.                           -  Continue present medications. Please add Protonix                            40 mg p.o. once a day                           - Await pathology results.                           - The findings and recommendations were discussed                             with the patient.                           - Best possible control of diabetes.                           - Continue symptomatic management. If still with                            epigastric pain, consider [place] Abdo.                           - FU GI as outpt in 4-6 weeks.                           - Consider solid-phase gastric emptying scan as                            outpatient off Reglan x 7 days                           - Will sign off for now.  Antimicrobials:  Anti-infectives (From admission, onward)    None        Subjective: Seen and examined at bedside and she is complaining of some tooth pain.  Also complained of some trouble swallowing but wanted to try soft diet today.  Has not tried the viscous lidocaine.  GI evaluated and recommending avoiding narcotics.  PT OT recommending home health still.  Patient is yet to tolerate a soft diet and until she does so we cannot safely discharge her.  Objective: Vitals:   05/31/21 1600 05/31/21 2125 06/01/21 2059 06/02/21 0412  BP: 126/86 (!) 157/96 (!) 162/96 (!) 165/134  Pulse: 98 84 79 (!) 104  Resp: Temp: 98.5 F (36.9 C) 98.5 F (36.9 C) 98.6 F (37 C) 100.2 F (37.9 C)  TempSrc: Oral Oral Oral Oral  SpO2: 98% 95% 97% 96%  Weight:      Height:        Intake/Output Summary (Last 24 hours) at 06/02/2021 1707 Last data filed at 06/01/2021 2105 Gross per 24 hour  Intake 120 ml  Output --  Net 120 ml    Filed Weights   05/28/21 1858  Weight: 74.8 kg   Examination: Physical Exam:  Constitutional: WN/WD Hispanic female currently in appears somewhat anxious and continues to complain of some back pain and tooth pain Eyes: Lids and conjunctivae  normal, sclerae anicteric  ENMT: External Ears, Nose appear normal. Grossly normal hearing.  Neck: Appears normal, supple, no cervical masses, normal ROM, no appreciable thyromegaly neck: No JVD Respiratory: Diminished to auscultation  bilaterally, no wheezing, rales, rhonchi or crackles. Normal respiratory effort and patient is not tachypenic. No accessory muscle use.  Unlabored breathing Cardiovascular: RRR, no murmurs / rubs / gallops. S1 and S2 auscultated. No extremity edema.  Abdomen: Soft, non-tender, non-distended. Bowel sounds positive.  GU: Deferred. Musculoskeletal: No clubbing / cyanosis of digits/nails. No joint deformity upper and lower extremities.  Skin: No rashes, lesions, ulcers. No induration; Warm and dry.  Neurologic: CN 2-12 grossly intact with no focal deficits. Romberg sign and cerebellar reflexes not assessed.  Psychiatric: Normal judgment and insight. Alert and oriented x 3.  A little anxious mood and appropriate affect.   Data Reviewed: I have personally reviewed following labs and imaging studies  CBC: Recent Labs  Lab 05/28/21 1900 05/28/21 1921 05/29/21 1018 05/30/21 0133 05/31/21 0354 06/01/21 0217 06/02/21 0608  WBC 9.7   < > 6.4 5.8 7.7 6.7 6.1  NEUTROABS 6.1  --  2.5 2.1  --  2.9 2.9  HGB 15.8*   < > 13.1 13.5 12.1 12.9 13.0  HCT 47.2*   < > 38.5 40.1 36.4 38.7 39.5  MCV 85.7   < > 84.8 86.1 86.5 86.8 86.8  PLT 277   < > 216 188 171 188 196   < > = values in this interval not displayed.    Basic Metabolic Panel: Recent Labs  Lab 05/29/21 1018 05/30/21 0133 05/31/21 0354 05/31/21 0403 06/01/21 0217 06/02/21 0608  NA 132* 134* 136  --  138 137  K 3.7 5.2* 4.1  --  4.6 4.3  CL 104 104 108  --  104 101  CO2 --  29 31  GLUCOSE 178* 345* 278*  --  139* 265*  BUN <5* 5* 6  --  6 <5*  CREATININE 0.35* 0.50 0.51  --  0.62 0.53  CALCIUM 8.2* 8.6* 8.4*  --  8.5* 8.8*  MG 1.8 1.9  --  1.6* 2.0 1.6*  PHOS 2.3* 3.1  --  3.7 4.2 4.4    GFR: Estimated Creatinine Clearance: 114.9 mL/min (by C-G formula based on SCr of 0.53 mg/dL). Liver Function Tests: Recent Labs  Lab 05/29/21 1018 05/30/21 0133 06/01/21 0217 06/02/21 0608  AST ALT ALKPHOS 72 72 59 66  BILITOT 0.7 0.9 0.7 0.5  PROT 5.6* 5.7* 5.6* 5.8*  ALBUMIN 2.8* 2.7* 2.8* 2.7*    Recent Labs  Lab 05/28/21 1937  LIPASE 19    No results for input(s): AMMONIA in the last 168 hours. Coagulation Profile: No results for input(s): INR, PROTIME in the last 168 hours. Cardiac Enzymes: No results for input(s): CKTOTAL, CKMB, CKMBINDEX, TROPONINI in the last 168 hours. BNP (last 3 results) No results for input(s): PROBNP in the last 8760 hours. HbA1C: No results for input(s): HGBA1C in the last 72 hours.  CBG: Recent Labs  Lab 06/01/21 1549 06/01/21 2128 06/02/21 0817 06/02/21 1255 06/02/21 1546  GLUCAP 92 253* 237* 310* 121*    Lipid Profile: No results for input(s): CHOL, HDL, LDLCALC, TRIG, CHOLHDL, LDLDIRECT in the last 72 hours. Thyroid Function Tests: No results for input(s): TSH, T4TOTAL, FREET4, T3FREE, THYROIDAB in the last 72 hours. Anemia Panel: No results for input(s): VITAMINB12, FOLATE, FERRITIN, TIBC,  IRON, RETICCTPCT in the last 72 hours. Sepsis Labs: No results for input(s): PROCALCITON, LATICACIDVEN in the last 168 hours.  Recent Results (from the past 240 hour(s))  Resp Panel by RT-PCR (Flu A&B, Covid) Nasopharyngeal Swab     Status: None   Collection Time: 05/28/21  7:37 PM   Specimen: Nasopharyngeal Swab; Nasopharyngeal(NP) swabs in vial transport medium  Result Value Ref Range Status   SARS Coronavirus 2 by RT PCR NEGATIVE NEGATIVE Final    Comment: (NOTE) SARS-CoV-2 target nucleic acids are NOT DETECTED.  The SARS-CoV-2 RNA is generally detectable in upper respiratory specimens during the acute phase of infection. The lowest concentration of SARS-CoV-2 viral copies this assay can detect is 138 copies/mL. A negative result does not preclude SARS-Cov-2 infection and should not be used as the sole basis for treatment or other patient management decisions. A negative result may occur with  improper specimen  collection/handling, submission of specimen other than nasopharyngeal swab, presence of viral mutation(s) within the areas targeted by this assay, and inadequate number of viral copies(<138 copies/mL). A negative result must be combined with clinical observations, patient history, and epidemiological information. The expected result is Negative.  Fact Sheet for Patients:  BloggerCourse.com  Fact Sheet for Healthcare Providers:  SeriousBroker.it  This test is no t yet approved or cleared by the [place] FDA and  has been authorized for detection and/or diagnosis of SARS-CoV-2 by FDA under an Emergency Use Authorization (EUA). This EUA will remain  in effect (meaning this test can be used) for the duration of the COVID-19 declaration under Section 564(b)(1) of the Act, 21 U.S.C.section 360bbb-3(b)(1), unless the authorization is terminated  or revoked sooner.       Influenza A by PCR NEGATIVE NEGATIVE Final   Influenza B by PCR NEGATIVE NEGATIVE Final    Comment: (NOTE) The Xpert Xpress SARS-CoV-2/FLU/RSV plus assay is intended as an aid in the diagnosis of influenza from Nasopharyngeal swab specimens and should not be used as a sole basis for treatment. Nasal washings and aspirates are unacceptable for Xpert Xpress SARS-CoV-2/FLU/RSV testing.  Fact Sheet for Patients: BloggerCourse.com  Fact Sheet for Healthcare Providers: SeriousBroker.it  This test is not yet approved or cleared by the [place] FDA and has been authorized for detection and/or diagnosis of SARS-CoV-2 by FDA under an Emergency Use Authorization (EUA). This EUA will remain in effect (meaning this test can be used) for the duration of the COVID-19 declaration under Section 564(b)(1) of the Act, 21 U.S.C. section 360bbb-3(b)(1), unless the authorization is terminated or revoked.  Performed at [facility], [address] [place], [place] [address]      RN Pressure Injury Documentation:     Estimated body mass index is 23.01 kg/m as calculated from the following:   Height as of this encounter: 5\' 11"  (1.803 m).   Weight as of this encounter: 74.8 kg.  Malnutrition Type:   Malnutrition Characteristics:   Nutrition Interventions:   Radiology Studies: DG Orthopantogram  Result Date: 06/01/2021 CLINICAL DATA:  Broken tooth EXAM: ORTHOPANTOGRAM/PANORAMIC COMPARISON:  11/03/2012 FINDINGS: Panorex view of the mandible was performed. There are no acute bony abnormalities. 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 Remaining visualize teeth are unremarkable. Visualized sinuses are clear. IMPRESSION: 1. 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 Otherwise unremarkable exam. Electronically Signed   By: 11/05/2012 M.D.   On: 06/01/2021 21:42   DG ESOPHAGUS W SINGLE CM (SOL OR THIN BA)  Result  Date: 06/01/2021 CLINICAL DATA:  Recent history of esophagitis. EXAM: ESOPHOGRAM / BARIUM SWALLOW / BARIUM TABLET STUDY TECHNIQUE: Single contrast examination performed using thin barium liquid. The patient was observed with fluoroscopy swallowing a 13 mm barium sulphate tablet. FLUOROSCOPY TIME:  Fluoroscopy Time:  2 minutes 12 seconds Radiation Exposure Index (if provided by the fluoroscopic device): 2 Number of Acquired Spot Images: 28.3 mGy COMPARISON:  None. FINDINGS: Exam is limited by patient weakness. Patient was unable to stand. Patient was evaluated in semi upright position. Single contrast examination of the esophagus demonstrated no obvious mucosal irregularity, stricture or mass. Contrast flows through GE junction easily. No tertiary contractions were identified. Barium tablet was administered which failed to pass the distal esophagus. Tablet lodged proximally 4 cm from the GE junction. There is a subtle thin indentation at this level suggests potential stricture or  web. Tablet failed to pass multiple sips of thin barium. IMPRESSION: 1. Barium tablet failed to pass the distal esophagus. Tablet lodged approximately 4-5 cm from the GE junction. Suspicion of occult mucosal inflammation or stricturing at this level. 2. No additional evidence mucosal irregularity, stricture, or mass. Electronically Signed   By: Genevive Bi M.D.   On: 06/01/2021 13:24    Scheduled Meds:  alum & mag hydroxide-simeth  10 mL Oral TID AC   diclofenac Sodium  2 g Topical QID   enoxaparin (LOVENOX) injection  40 mg Subcutaneous Daily   gabapentin  400 mg Oral TID   influenza vac split quadrivalent PF  0.5 mL Intramuscular Tomorrow-1000   insulin aspart  0-9 Units Subcutaneous TID WC   insulin aspart  4 Units Subcutaneous TID WC   insulin detemir  10 Units Subcutaneous BID   lidocaine  1 patch Transdermal Q24H   lidocaine  15 mL Mouth/Throat TID AC & HS   lisdexamfetamine  40 mg Oral Daily   metoCLOPramide (REGLAN) injection  10 mg Intravenous Q8H   pantoprazole (PROTONIX) IV  40 mg Intravenous Q12H   scopolamine  1 patch Transdermal Q72H   sucralfate  1 g Oral TID WC & HS   Continuous Infusions:  dextrose 5% lactated ringers 125 mL/hr at 06/01/21 2343   promethazine (PHENERGAN) injection (IM or IVPB) 6.25 mg (06/02/21 0430)    LOS: 4 days   Merlene Laughter, DO [facility] PAGER is on AMION  If 7PM-7AM, please contact night-coverage [email or web address]

## 2021-06-02 NOTE — Progress Notes (Signed)
Inpatient Diabetes Program Recommendations  AACE/ADA: New Consensus Statement on Inpatient Glycemic Control   Target Ranges:  Prepandial:   less than 140 mg/dL      Peak postprandial:   less than 180 mg/dL (1-2 hours)      Critically ill patients:  140 - 180 mg/dL   Results for [name], Julie (MRN [id number]) as of 06/02/2021 07:25  Ref. Range 06/01/2021 08:06 06/01/2021 9:58 06/01/2021 12:22 06/01/2021 14:11 06/01/2021 15:49 06/01/2021 21:28 06/01/2021 23:41  Glucose-Capillary Latest Ref Range: 70 - 99 mg/dL 238 (H)  Novolog 3 units@8:36     Novolog 4 units (meal cov; pt was NPO)  Levemir 10 units 62 (L) 86 92 253 (H)           Levemir 10 units    Review of Glycemic Control Diabetes history: DM 1 Outpatient Diabetes medications: Levemir 35 units daily, Novolog 6 units tid with meals Current orders for Inpatient glycemic control: Levemir 10 units BID, Novolog 0-9 units TID with meals, Novolog 4 units TID with meals  Inpatient Diabetes Program Recommendations:    Insulin: Noted patient received meal coverage for breakfast yesterday. Patient was NPO and should not have gotten meal coverage insulin. Following CBG 62 mg/dl at 12:22. No meal coverage given rest of the day and glucose at bedtime up to 253 mg/dl.  NURSING: Please administer meal coverage insulin only when parameters are met.  Thanks, Marie Byrd, RN, MSN, CDE Diabetes Coordinator Inpatient Diabetes Program [phone] (Team Pager from 8am to 5pm)    

## 2021-06-02 NOTE — Plan of Care (Signed)

## 2021-06-02 NOTE — Progress Notes (Signed)
Pt able to take some meds whole. She is requesting IV meds. This nurse educated Pt on importance of taking oral meds vs IV. Pt able to swallow meds, coffee, pudding, and soft drinks.

## 2021-06-02 NOTE — Progress Notes (Signed)
Occupational Therapy Treatment Patient Details Name: Julie [name] MRN: [id number] DOB: 12/20/1990 Today's Date: 06/02/2021   History of present illness 30 y.o. female admitted 9/8 with DKA, CP and N/V. Pt with insulin pump which recently broke. EDG 9/10. Pt reports fall 9/10.  PMHx: DM type 1, anxiety, insomnia, chronic low back pain status post lumbar laminectomy. Pt reports fall in hospital while returning to bed after toileting "couple of days ago."   OT comments  Patient progressing and showed improved ability to stand for grooming and to perform all toileting and toilet transfer Mod I, compared to previous session. Patient remains limited by LLE pain/numbness/weakness with LT dragging with gait, generalized weakness and decreased activity tolerance along with deficits noted below. Pt continues to demonstrate good rehab potential and would benefit from continued skilled OT to increase safety and independence with ADLs and functional transfers to allow pt to return home safely and reduce caregiver burden and fall risk.    Recommendations for follow up therapy are one component of a multi-disciplinary discharge planning process, led by the attending physician.  Recommendations may be updated based on patient status, additional functional criteria and insurance authorization.    Follow Up Recommendations  Home health OT    Equipment Recommendations   (TBD) .  Possible AFO to LLE?   Recommendations for Other Services      Precautions / Restrictions Precautions Precautions: Fall Restrictions Weight Bearing Restrictions: No       Mobility Bed Mobility Overal bed mobility: Modified Independent Bed Mobility: Supine to Sit;Sit to Supine     Supine to sit: Modified independent (Device/Increase time) Sit to supine: Modified independent (Device/Increase time)   General bed mobility comments: Pt transitioned from supine to long sitting to sitting EOB all Mod I.     Transfers Overall transfer level: Modified independent Equipment used: Rolling walker (2 wheeled) Transfers: Sit to/from Stand Sit to Stand: Modified independent (Device/Increase time)         General transfer comment: Sit to stand x2. Pt able to stand from toilet without assist. Performed typical speed    Balance Overall balance assessment: History of Falls;Needs assistance Sitting-balance support: Feet supported;No upper extremity supported Sitting balance-Leahy Scale: Good Sitting balance - Comments: Sat EOB and on toilet without assist   Standing balance support: No upper extremity supported;Bilateral upper extremity supported Standing balance-Leahy Scale: Poor (Good static; poor dynamic) Standing balance comment: Able to stand at sink and hands without assist and maintains static balance. Reliant on BUE support on RW for gait with knee buckle performing lateral steps up to [facility]..             High level balance activites: Side stepping High Level Balance Comments: supervision with BUE support.           ADL either performed or assessed with clinical judgement   ADL Overall ADL's : Needs assistance/impaired Eating/Feeding: Independent   Grooming: Wash/dry hands;Standing;Supervision/safety Grooming Details (indicate cue type and reason): Pt performed hand hygiene post toileting while standing at sink with supervision only for safety with standing due to h/o knee buckle.                 Toilet Transfer: Radio producer;Ambulation;RW;Min guard;Modified Independent Toilet Transfer Details (indicate cue type and reason): Pt ambulated slowly, prefers to advance stronger RT foot first and drag LLE behind her to step-to. Min guard for ambulation to bathroom and Mod I with use of grab bar to descend and ascend to toilet. Toileting-  Clothing Manipulation and Hygiene: Modified independent;Sitting/lateral lean               Vision Baseline Vision/History:  1 Wears glasses Vision Assessment?: No apparent visual deficits   Perception     Praxis      Cognition Arousal/Alertness: Awake/alert Behavior During Therapy: WFL for tasks assessed/performed Overall Cognitive Status: Within Functional Limits for tasks assessed                                 General Comments: Pleasant and cooperative.        Exercises     Shoulder Instructions       General Comments VSS on RA    Pertinent Vitals/ Pain       Pain Assessment: Faces Pain Score: 7  Faces Pain Scale: Hurts little more Pain Location: LT LE after ambulating in hallway.  Pt rubbing LLE vigorously once sitting EOB. Pain Descriptors / Indicators: Grimacing;Numbness Pain Intervention(s): Limited activity within patient's tolerance;Monitored during session;Repositioned  Home Living                                          Prior Functioning/Environment              Frequency  Min 2X/week        Progress Toward Goals  OT Goals(current goals can now be found in the care plan section)  Progress towards OT goals: Progressing toward goals  Acute Rehab OT Goals Patient Stated Goal: return home without pain. Be able to take care of kids. Currently paying her mother to watch kids. OT Goal Formulation: With patient Time For Goal Achievement: 06/14/21 Potential to Achieve Goals: Good  Plan Discharge plan remains appropriate    Co-evaluation                 AM-PAC OT "6 Clicks" Daily Activity     Outcome Measure   Help from another person eating meals?: None Help from another person taking care of personal grooming?: A Little Help from another person toileting, which includes using toliet, bedpan, or urinal?: A Little (to ambulate to bathroom) Help from another person bathing (including washing, rinsing, drying)?: A Little Help from another person to put on and taking off regular upper body clothing?: A Little (setup) Help from  another person to put on and taking off regular lower body clothing?: A Little 6 Click Score: 19    End of Session Equipment Utilized During Treatment: Gait belt;Rolling walker  OT Visit Diagnosis: History of falling (Z91.81);Pain Pain - Right/Left: Left Pain - part of body: Leg;Ankle and joints of foot   Activity Tolerance Patient limited by pain   Patient Left in bed;with call bell/phone within reach;with nursing/sitter in room   Nurse Communication Mobility status        Time: 1338-1410 OT Time Calculation (min): 32 min  Charges: OT General Charges $OT Visit: 1 Visit OT Treatments $Self Care/Home Management : 8-22 mins $Therapeutic Activity: 8-22 mins  Victorino Dike, OT Acute Rehab Services Office: [phone] 06/02/2021  Theodoro Clock 06/02/2021, 2:19 PM

## 2021-06-02 NOTE — Progress Notes (Signed)
Daily Rounding Note  06/02/2021, 2:05 PM  LOS: 4 days   SUBJECTIVE:  Patient states that she is still having similar symptoms, but she is willing to try to eat today. Has not tried viscous lidocaine or been given a lidocaine patch yet per patient. Has not vomited today.  OBJECTIVE:         Vital signs in last 24 hours:    Temp:  [98.6 F (37 C)-100.2 F (37.9 C)] 100.2 F (37.9 C) (09/13 0412) Pulse Rate:  [79-104] 104 (09/13 0412) Resp:  [18-20] 20 (09/13 0412) BP: (162-165)/(96-134) 165/134 (09/13 0412) SpO2:  [96 %-97 %] 96 % (09/13 0412) Last BM Date: 05/28/21 Filed Weights   05/28/21 1858  Weight: 74.8 kg   General: Appears comfortable sitting in chair Heart: regular rate Chest: no increased WOB. Tender over mid chest to palpation Abdomen: Soft without tenderness. Extremities: No edema Neuro/Psych: Appropriate  Intake/Output from previous day: 09/12 0701 - 09/13 0700 In: 120 [P.O.:120] Out: -   Intake/Output this shift: No intake/output data recorded.  Lab Results: Recent Labs    05/31/21 0354 06/01/21 0217 06/02/21 0608  WBC 7.7 6.7 6.1  HGB 12.1 12.9 13.0  HCT 36.4 38.7 39.5  PLT 171 188 196   BMET Recent Labs    05/31/21 0354 06/01/21 0217 06/02/21 0608  NA 136 138 137  K 4.1 4.6 4.3  CL 108 104 101  CO2 23 29 31   GLUCOSE 278* 139* 265*  BUN 6 6 <5*  CREATININE 0.51 0.62 0.53  CALCIUM 8.4* 8.5* 8.8*   LFT Recent Labs    06/01/21 0217 06/02/21 0608  PROT 5.6* 5.8*  ALBUMIN 2.8* 2.7*  AST 27 17  ALT 11 11  ALKPHOS 59 66  BILITOT 0.7 0.5   PT/INR No results for input(s): LABPROT, INR in the last 72 hours. Hepatitis Panel No results for input(s): HEPBSAG, HCVAB, HEPAIGM, HEPBIGM in the last 72 hours.  Studies/Results: DG Orthopantogram  Result Date: 06/01/2021 CLINICAL DATA:  Broken tooth EXAM: ORTHOPANTOGRAM/PANORAMIC COMPARISON:  11/03/2012 FINDINGS: Panorex view of  the mandible was performed. There are no acute bony abnormalities. 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 Remaining visualize teeth are unremarkable. Visualized sinuses are clear. IMPRESSION: 1. 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 Otherwise unremarkable exam. Electronically Signed   By: 11/05/2012 M.D.   On: 06/01/2021 21:42   DG ESOPHAGUS W SINGLE CM (SOL OR THIN BA)  Result Date: 06/01/2021 CLINICAL DATA:  Recent history of esophagitis. EXAM: ESOPHOGRAM / BARIUM SWALLOW / BARIUM TABLET STUDY TECHNIQUE: Single contrast examination performed using thin barium liquid. The patient was observed with fluoroscopy swallowing a 13 mm barium sulphate tablet. FLUOROSCOPY TIME:  Fluoroscopy Time:  2 minutes 12 seconds Radiation Exposure Index (if provided by the fluoroscopic device): 2 Number of Acquired Spot Images: 28.3 mGy COMPARISON:  None. FINDINGS: Exam is limited by patient weakness. Patient was unable to stand. Patient was evaluated in semi upright position. Single contrast examination of the esophagus demonstrated no obvious mucosal irregularity, stricture or mass. Contrast flows through GE junction easily. No tertiary contractions were identified. Barium tablet was administered which failed to pass the distal esophagus. Tablet lodged proximally 4 cm from the GE junction. There is a subtle thin indentation at this level suggests potential stricture or web. Tablet failed to pass multiple sips of thin barium. IMPRESSION: 1. Barium tablet failed to pass the distal esophagus. Tablet lodged approximately 4-5  cm from the GE junction. Suspicion of occult mucosal inflammation or stricturing at this level. 2. No additional evidence mucosal irregularity, stricture, or mass. Electronically Signed   By: Genevive Bi M.D.   On: 06/01/2021 13:24    ASSESMENT:     Persistent dysphagia.  EGD a few d ago w normal, non-strictured esophagus, gastritis.  EGD showed esophageal bx with reactive squamous  mucosa, stomach with mild chronic gastritis (negative for H pylori), benign duodenal mucosa     IDDM, type 1.  A1c 11.7 at admission.       Anxiety.   Note that psychiatry is wondering whether this is somatization or secondary gain since her mother is now responsible for watching the patient's 3 children.   PLAN   Encouraged use of antiemetics prior to eating. Trial of viscous lidocaine AC.   Continue to try to wean off narcotics which can create an achalasia type picture.  Continue carafate, reglan, PPI BID. Try to re-emphasize trying lidocaine patch and/or voltaren gel over chest Recommend improved control of blood sugars as lability in blood sugars can lead to N&V Can consider gastric emptying study as an outpatient  Eulah Pont, MD

## 2021-06-02 NOTE — Progress Notes (Signed)
Physical Therapy Treatment Patient Details Name: Julie [name] MRN: [id number] DOB: 21-Dec-1990 Today's Date: 06/02/2021   History of Present Illness 30 y.o. female admitted 9/8 with DKA, CP and N/V. Pt with insulin pump which recently broke. EDG 9/10. Pt reports fall 9/10.  PMHx: DM type 1, anxiety, insomnia, chronic low back pain status post lumbar laminectomy    PT Comments    Pt tolerated treatment well and reported increased pain during activity. Pt increased ambulation in hall with RW and supervision-min guard assist. Reported shakiness in extremities and partial bil knee buckling during gait intermittently. Pt showed difficulty lifting LEs in seated position and states it's from previous back surgery. Pt modified independent with transfers and mobility and is able to maintain static standing balance to perform pericare. Continue to recommend HHPT given pt's functional status and mobility in therapy.    Recommendations for follow up therapy are one component of a multi-disciplinary discharge planning process, led by the attending physician.  Recommendations may be updated based on patient status, additional functional criteria and insurance authorization.  Follow Up Recommendations  Home health PT     Equipment Recommendations  None recommended by PT    Recommendations for Other Services       Precautions / Restrictions Precautions Precautions: Fall Restrictions Weight Bearing Restrictions: No     Mobility  Bed Mobility Overal bed mobility: Modified Independent Bed Mobility: Supine to Sit     Supine to sit: Modified independent (Device/Increase time)     General bed mobility comments: Pt used bed rails to assist. Slightly increased time.    Transfers Overall transfer level: Modified independent Equipment used: Rolling walker (2 wheeled)             General transfer comment: Sit to stand x2. Pt able to stand from toilet without assist. Performed  typical speed  Ambulation/Gait Ambulation/Gait assistance: Min guard;Supervision Gait Distance (Feet): 164 Feet Assistive device: Rolling walker (2 wheeled) Gait Pattern/deviations: Step-through pattern;Decreased stride length;Decreased stance time - right Gait velocity: Decreased Gait velocity interpretation: 1.31 - 2.62 ft/sec, indicative of limited community ambulator General Gait Details: Pt heavily relied on RW. Supervision initially, then pt reported feeling shaky in extremities with bil knee buckling approximately 1' into walk. Verbal cues to emphasize quad activation. Pt reported increased pain with movement.   Stairs             Wheelchair Mobility    Modified Rankin (Stroke Patients Only)       Balance Overall balance assessment: History of Falls;Needs assistance Sitting-balance support: Feet supported;No upper extremity supported Sitting balance-Leahy Scale: Good Sitting balance - Comments: Sat EOB and on toilet without assist   Standing balance support: No upper extremity supported;Bilateral upper extremity supported Standing balance-Leahy Scale: Good Standing balance comment: Able to stand at sink and brush teeth without assist and maintains static balance. Reliant on BUE support on RW for gait                            Cognition                                              Exercises      General Comments General comments (skin integrity, edema, etc.): VSS on RA      Pertinent Vitals/Pain Pain Score: 7  Pain  Location: L hip, abdomen, back which increased with activity to 9 Pain Descriptors / Indicators: Aching;Grimacing Pain Intervention(s): Monitored during session;Repositioned;Premedicated before session    Home Living                      Prior Function            PT Goals (current goals can now be found in the care plan section) Progress towards PT goals: Progressing toward goals     Frequency    Min 3X/week      PT Plan Current plan remains appropriate    Co-evaluation              AM-PAC PT "6 Clicks" Mobility   Outcome Measure  Help needed turning from your back to your side while in a flat bed without using bedrails?: None Help needed moving from lying on your back to sitting on the side of a flat bed without using bedrails?: None Help needed moving to and from a bed to a chair (including a wheelchair)?: A Little Help needed standing up from a chair using your arms (e.g., wheelchair or bedside chair)?: A Little Help needed to walk in hospital room?: A Little Help needed climbing 3-5 steps with a railing? : A Lot 6 Click Score: 19    End of Session Equipment Utilized During Treatment: Gait belt Activity Tolerance: Patient limited by pain Patient left: with call bell/phone within reach;in chair;with chair alarm set Nurse Communication: Mobility status PT Visit Diagnosis: Other abnormalities of gait and mobility (R26.89);Muscle weakness (generalized) (M62.81);Pain;Difficulty in walking, not elsewhere classified (R26.2) Pain - Right/Left: Left Pain - part of body: Hip     Time: 7001-7494 PT Time Calculation (min) (ACUTE ONLY): 34 min  Charges:  $Gait Training: 8-22 mins $Therapeutic Activity: 8-22 mins                     Velda Shell, SPT Acute Rehab: [phone]    Vance Gather 06/02/2021, 11:21 AM

## 2021-06-02 NOTE — Consult Note (Signed)
Reason for Consult: Anxiety/Depression manifesting Somatic Complaints and possible secondary gain Referring Physician: Merlene Laughter, DO  Julie [name] is an 30 y.o. female.  HPI: Mrs. Julie [name] is a 30 yr old female with PMHx significant for Type 1 diabetes mellitus on insulin pump which recently broke, anxiety, insomnia, chronic low back pain status post lumbar laminectomy  who presented to the ED for evaluation of high blood sugar, chest pain and nausea vomiting.   She reports that she is doing okay today.  She reports that she slept decently last night with approximately 6-1/2 hours.  She reports that she will be trying to eat today, however, is quick to interject that she still does have significant chest pain with swallowing.  She reports no SI, HI, or AVH.  Yesterday afternoon she lost part of her teeth.  When asked what happened she said that she was coming back from the bathroom and simply rubbed her tongue across her teeth and part of the tooth broke off.  She reports no other injury or incident to the teeth.  She does report that she has had some pain for a day or 2 in that area.  She states that she is happy that in area of stricture was found in the imaging testing done yesterday.  She states that from a mood standpoint she is doing good.  She reports that the Ambien has been helpful in increasing her sleep.  She reports that she still is having significant pain with swallowing and nausea/vomiting.  She reports no psychiatric issues at this time.   Past Medical History:  Diagnosis Date   Bulimia nervosa    Dental caries    Diabetes mellitus without complication (HCC)    Genital warts    Noncompliance with diabetes treatment    Pelvic fracture [facility])     Past Surgical History:  Procedure Laterality Date   BIOPSY  05/30/2021   Procedure: BIOPSY;  Surgeon: Lynann Bologna, MD;  Location: [facility] ENDOSCOPY;  Service: Endoscopy;;   CESAREAN SECTION     CESAREAN SECTION   2012   ESOPHAGOGASTRODUODENOSCOPY (EGD) WITH PROPOFOL N/A 05/30/2021   Procedure: ESOPHAGOGASTRODUODENOSCOPY (EGD) WITH PROPOFOL;  Surgeon: Lynann Bologna, MD;  Location: [facility] ENDOSCOPY;  Service: Endoscopy;  Laterality: N/A;    Family History  Problem Relation Age of Onset   Diabetes Mother     Social History:  reports that she has been smoking cigarettes. She has never used smokeless tobacco. She reports that she does not currently use drugs. She reports that she does not drink alcohol.  Allergies: No Known Allergies  Medications: I have reviewed the patient's current medications.  Results for orders placed or performed during the hospital encounter of 05/28/21 (from the past 48 hour(s))  Glucose, capillary     Status: Abnormal   Collection Time: 05/31/21 11:49 AM  Result Value Ref Range   Glucose-Capillary 103 (H) 70 - 99 mg/dL    Comment: Glucose reference range applies only to samples taken after fasting for at least 8 hours.  Glucose, capillary     Status: Abnormal   Collection Time: 05/31/21  3:56 PM  Result Value Ref Range   Glucose-Capillary 367 (H) 70 - 99 mg/dL    Comment: Glucose reference range applies only to samples taken after fasting for at least 8 hours.  Glucose, capillary     Status: Abnormal   Collection Time: 05/31/21  9:20 PM  Result Value Ref Range   Glucose-Capillary 101 (H) 70 - 99  mg/dL    Comment: Glucose reference range applies only to samples taken after fasting for at least 8 hours.  CBC with Differential/Platelet     Status: None   Collection Time: 06/01/21  2:17 AM  Result Value Ref Range   WBC 6.7 4.0 - 10.5 K/uL   RBC 4.46 3.87 - 5.11 MIL/uL   Hemoglobin 12.9 12.0 - 15.0 g/dL   HCT 83.1 51.7 - 61.6 %   MCV 86.8 80.0 - 100.0 fL   MCH 28.9 26.0 - 34.0 pg   MCHC 33.3 30.0 - 36.0 g/dL   RDW 07.3 71.0 - 62.6 %   Platelets 188 150 - 400 K/uL   nRBC 0.0 0.0 - 0.2 %   Neutrophils Relative % 43 %   Neutro Abs 2.9 1.7 - 7.7 K/uL   Lymphocytes  Relative 45 %   Lymphs Abs 3.0 0.7 - 4.0 K/uL   Monocytes Relative 8 %   Monocytes Absolute 0.5 0.1 - 1.0 K/uL   Eosinophils Relative 3 %   Eosinophils Absolute 0.2 0.0 - 0.5 K/uL   Basophils Relative 0 %   Basophils Absolute 0.0 0.0 - 0.1 K/uL   Immature Granulocytes 1 %   Abs Immature Granulocytes 0.03 0.00 - 0.07 K/uL    Comment: Performed at [facility], [address] [place], [place] [address]  Comprehensive metabolic panel     Status: Abnormal   Collection Time: 06/01/21  2:17 AM  Result Value Ref Range   Sodium 138 135 - 145 mmol/L   Potassium 4.6 3.5 - 5.1 mmol/L    Comment: SPECIMEN HEMOLYZED. HEMOLYSIS MAY AFFECT INTEGRITY OF RESULTS.   Chloride 104 98 - 111 mmol/L   CO2 29 22 - 32 mmol/L   Glucose, Bld 139 (H) 70 - 99 mg/dL    Comment: Glucose reference range applies only to samples taken after fasting for at least 8 hours.   BUN 6 6 - 20 mg/dL   Creatinine, Ser 6.27 0.44 - 1.00 mg/dL   Calcium 8.5 (L) 8.9 - 10.3 mg/dL   Total Protein 5.6 (L) 6.5 - 8.1 g/dL   Albumin 2.8 (L) 3.5 - 5.0 g/dL   AST 27 15 - 41 U/L   ALT 11 0 - 44 U/L   Alkaline Phosphatase 59 38 - 126 U/L   Total Bilirubin 0.7 0.3 - 1.2 mg/dL   GFR, Estimated >03 >50 mL/min    Comment: (NOTE) Calculated using the CKD-EPI Creatinine Equation (2021)    Anion gap 5 5 - 15    Comment: Performed at [facility], [address], [place], [place] [address]  Magnesium     Status: None   Collection Time: 06/01/21  2:17 AM  Result Value Ref Range   Magnesium 2.0 1.7 - 2.4 mg/dL    Comment: Performed at [facility], [address], [place], [place] [address]  Phosphorus     Status: None   Collection Time: 06/01/21  2:17 AM  Result Value Ref Range   Phosphorus 4.2 2.5 - 4.6 mg/dL    Comment: Performed at [facility], [address] [place], [place] [address]  Glucose, capillary     Status: Abnormal   Collection Time: 06/01/21  8:06 AM  Result Value Ref Range    Glucose-Capillary 238 (H) 70 - 99 mg/dL    Comment: Glucose reference range applies only to samples taken after fasting for at least 8 hours.  Glucose, capillary  Status: Abnormal   Collection Time: 06/01/21 12:22 PM  Result Value Ref Range   Glucose-Capillary 62 (L) 70 - 99 mg/dL    Comment: Glucose reference range applies only to samples taken after fasting for at least 8 hours.  Glucose, capillary     Status: None   Collection Time: 06/01/21  2:11 PM  Result Value Ref Range   Glucose-Capillary 86 70 - 99 mg/dL    Comment: Glucose reference range applies only to samples taken after fasting for at least 8 hours.  Glucose, capillary     Status: None   Collection Time: 06/01/21  3:49 PM  Result Value Ref Range   Glucose-Capillary 92 70 - 99 mg/dL    Comment: Glucose reference range applies only to samples taken after fasting for at least 8 hours.  Glucose, capillary     Status: Abnormal   Collection Time: 06/01/21  9:28 PM  Result Value Ref Range   Glucose-Capillary 253 (H) 70 - 99 mg/dL    Comment: Glucose reference range applies only to samples taken after fasting for at least 8 hours.  CBC with Differential/Platelet     Status: None   Collection Time: 06/02/21  6:08 AM  Result Value Ref Range   WBC 6.1 4.0 - 10.5 K/uL   RBC 4.55 3.87 - 5.11 MIL/uL   Hemoglobin 13.0 12.0 - 15.0 g/dL   HCT 34.2 87.6 - 81.1 %   MCV 86.8 80.0 - 100.0 fL   MCH 28.6 26.0 - 34.0 pg   MCHC 32.9 30.0 - 36.0 g/dL   RDW 57.2 62.0 - 35.5 %   Platelets 196 150 - 400 K/uL   nRBC 0.0 0.0 - 0.2 %   Neutrophils Relative % 48 %   Neutro Abs 2.9 1.7 - 7.7 K/uL   Lymphocytes Relative 40 %   Lymphs Abs 2.4 0.7 - 4.0 K/uL   Monocytes Relative 8 %   Monocytes Absolute 0.5 0.1 - 1.0 K/uL   Eosinophils Relative 4 %   Eosinophils Absolute 0.3 0.0 - 0.5 K/uL   Basophils Relative 0 %   Basophils Absolute 0.0 0.0 - 0.1 K/uL   Immature Granulocytes 0 %   Abs Immature Granulocytes 0.02 0.00 - 0.07 K/uL     Comment: Performed at [facility], [address] [place], [place] [address]  Comprehensive metabolic panel     Status: Abnormal   Collection Time: 06/02/21  6:08 AM  Result Value Ref Range   Sodium 137 135 - 145 mmol/L   Potassium 4.3 3.5 - 5.1 mmol/L   Chloride 101 98 - 111 mmol/L   CO2 31 22 - 32 mmol/L   Glucose, Bld 265 (H) 70 - 99 mg/dL    Comment: Glucose reference range applies only to samples taken after fasting for at least 8 hours.   BUN <5 (L) 6 - 20 mg/dL   Creatinine, Ser 3.84 0.44 - 1.00 mg/dL   Calcium 8.8 (L) 8.9 - 10.3 mg/dL   Total Protein 5.8 (L) 6.5 - 8.1 g/dL   Albumin 2.7 (L) 3.5 - 5.0 g/dL   AST 17 15 - 41 U/L   ALT 11 0 - 44 U/L   Alkaline Phosphatase 66 38 - 126 U/L   Total Bilirubin 0.5 0.3 - 1.2 mg/dL   GFR, Estimated >53 >64 mL/min    Comment: (NOTE) Calculated using the CKD-EPI Creatinine Equation (2021)    Anion gap 5 5 - 15    Comment: Performed  at [facility], [address] [place], [place] [address]  Magnesium     Status: Abnormal   Collection Time: 06/02/21  6:08 AM  Result Value Ref Range   Magnesium 1.6 (L) 1.7 - 2.4 mg/dL    Comment: Performed at [facility], [address], [place], [place] [address]  Phosphorus     Status: None   Collection Time: 06/02/21  6:08 AM  Result Value Ref Range   Phosphorus 4.4 2.5 - 4.6 mg/dL    Comment: Performed at [facility], [address], [place], [place] [address]  Glucose, capillary     Status: Abnormal   Collection Time: 06/02/21  8:17 AM  Result Value Ref Range   Glucose-Capillary 237 (H) 70 - 99 mg/dL    Comment: Glucose reference range applies only to samples taken after fasting for at least 8 hours.    DG Orthopantogram  Result Date: 06/01/2021 CLINICAL DATA:  Broken tooth EXAM: ORTHOPANTOGRAM/PANORAMIC COMPARISON:  11/03/2012 FINDINGS: Panorex view of the mandible was performed. There are no acute bony abnormalities. 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 Remaining visualize teeth are unremarkable. Visualized sinuses are clear. IMPRESSION: 1. 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 Otherwise unremarkable exam. Electronically Signed   By: Sharlet Salina M.D.   On: 06/01/2021 21:42   DG ESOPHAGUS W SINGLE CM (SOL OR THIN BA)  Result Date: 06/01/2021 CLINICAL DATA:  Recent history of esophagitis. EXAM: ESOPHOGRAM / BARIUM SWALLOW / BARIUM TABLET STUDY TECHNIQUE: Single contrast examination performed using thin barium liquid. The patient was observed with fluoroscopy swallowing a 13 mm barium sulphate tablet. FLUOROSCOPY TIME:  Fluoroscopy Time:  2 minutes 12 seconds Radiation Exposure Index (if provided by the fluoroscopic device): 2 Number of Acquired Spot Images: 28.3 mGy COMPARISON:  None. FINDINGS: Exam is limited by patient weakness. Patient was unable to stand. Patient was evaluated in semi upright position. Single contrast examination of the esophagus demonstrated no obvious mucosal irregularity, stricture or mass. Contrast flows through GE junction easily. No tertiary contractions were identified. Barium tablet was administered which failed to pass the distal esophagus. Tablet lodged proximally 4 cm from the GE junction. There is a subtle thin indentation at this level suggests potential stricture or web. Tablet failed to pass multiple sips of thin barium. IMPRESSION: 1. Barium tablet failed to pass the distal esophagus. Tablet lodged approximately 4-5 cm from the GE junction. Suspicion of occult mucosal inflammation or stricturing at this level. 2. No additional evidence mucosal irregularity, stricture, or mass. Electronically Signed   By: Genevive Bi M.D.   On: 06/01/2021 13:24    Review of Systems  Respiratory:  Positive for chest tightness. Negative for choking.   Cardiovascular:  Positive for chest pain.  Gastrointestinal:  Positive for nausea and vomiting. Negative for abdominal pain, constipation and diarrhea.  Neurological:   Positive for headaches.  Psychiatric/Behavioral:  Negative for agitation, hallucinations, sleep disturbance and suicidal ideas. The patient is nervous/anxious (mild).   Blood pressure (!) 165/134, pulse (!) 104, temperature 100.2 F (37.9 C), temperature source Oral, resp. rate 20, height 5\' 11"  (1.803 m), weight 74.8 kg, last menstrual period 05/04/2021, SpO2 96 %. Physical Exam Vitals and nursing note reviewed.  Constitutional:      Appearance: She is well-developed and normal weight.  HENT:     Head: Normocephalic and atraumatic.  Pulmonary:     Effort: Pulmonary effort is normal.  Musculoskeletal:  General: Normal range of motion.  Neurological:     General: No focal deficit present.     Mental Status: She is alert.    06/02/21 1108  Presentation  General Appearance Appropriate for Environment;Casual  Eye Contact Good  Speech Clear and Coherent;Normal Rate  Speech Volume Normal  Mood and Affect  Mood Euthymic  Affect Non-Congruent (states she is in significant pain but sitting upright in chair coloring)  Thought Processes  Thought Process Coherent;Goal Directed  Descriptions of Associations Intact  Orientation Full (Time, Place and Person)  Thought Content Logical;WDL  Hallucinations None  Ideas of Reference None  Suicidal Thoughts No  Homicidal Thoughts No  Sensorium  Memory Immediate Fair;Recent Fair  Judgment Poor  Insight Poor  Executive Functions  Concentration Good  Attention Span Good  Recall Good  Fund of Knowledge Good  Language Good  Psychomotor Activity  Psychomotor Activity Normal  Assets  Assets Communication Skills;Resilience  Sleep  Sleep Good    Assessment/Plan: Case and Plan discussed with Dr. Lucianne Muss  Mrs. FerreriaVargas is a 30 yr old female with PMHx significant for Type 1 diabetes mellitus on insulin pump which recently broke, anxiety, insomnia, chronic low back pain status post lumbar laminectomy  who presented to the ED for  evaluation of high blood sugar, chest pain and nausea vomiting.     Patient is doing fine from a psychiatric standpoint.  She reports that the Ambien is improving her sleep and the Atarax is controlling her anxiety.  At this point there is a strong concern for secondary gain as she continues to request IV pain medication and ADHD medication.  At this time we will sign off on the patient.     Anxiety Insomnia: -Continue Ambien 5 mg QHS PRN -Continue Atarax 25 mg TID PRN     Secondary gain Vs Somatization: -Recommend no IV pain medications     Continue rest of care per Primary Team   Psychiatry will sign off on the patient's but should any further questions arise please do not hesitate to reconsult thank you so much and allowing [place] participate in this patient's care.     Disposition: Do not recommend inpatient psychiatric care at this time    Lauro Franklin 06/02/2021, 9:45 AM

## 2021-06-02 NOTE — Progress Notes (Signed)
Pt tolerating all PO meds with no issues. Pt has eaten 100 of meals with no issues.

## 2021-06-03 DIAGNOSIS — F418 Other specified anxiety disorders: Secondary | ICD-10-CM | POA: Diagnosis not present

## 2021-06-03 DIAGNOSIS — R1013 Epigastric pain: Secondary | ICD-10-CM | POA: Diagnosis not present

## 2021-06-03 DIAGNOSIS — R0789 Other chest pain: Secondary | ICD-10-CM | POA: Diagnosis not present

## 2021-06-03 DIAGNOSIS — E1010 Type 1 diabetes mellitus with ketoacidosis without coma: Secondary | ICD-10-CM | POA: Diagnosis not present

## 2021-06-03 LAB — COMPREHENSIVE METABOLIC PANEL
ALT: 10 U/L (ref 0–44)
AST: 21 U/L (ref 15–41)
Albumin: 2.8 g/dL — ABNORMAL LOW (ref 3.5–5.0)
Alkaline Phosphatase: 67 U/L (ref 38–126)
Anion gap: 8 (ref 5–15)
BUN: 8 mg/dL (ref 6–20)
CO2: 25 mmol/L (ref 22–32)
Calcium: 8.8 mg/dL — ABNORMAL LOW (ref 8.9–10.3)
Chloride: 98 mmol/L (ref 98–111)
Creatinine, Ser: 0.62 mg/dL (ref 0.44–1.00)
GFR, Estimated: >60 mL/min (ref >60)
Glucose, Bld: 446 mg/dL — ABNORMAL HIGH (ref 70–99)
Potassium: 5.5 mmol/L — ABNORMAL HIGH (ref 3.5–5.1)
Sodium: 131 mmol/L — ABNORMAL LOW (ref 135–145)
Total Bilirubin: 0.3 mg/dL (ref 0.3–1.2)
Total Protein: 6.2 g/dL — ABNORMAL LOW (ref 6.5–8.1)

## 2021-06-03 LAB — CBC WITH DIFFERENTIAL/PLATELET
Abs Immature Granulocytes: 0.03 10*3/uL (ref 0.00–0.07)
Basophils Absolute: 0 10*3/uL (ref 0.0–0.1)
Basophils Relative: 0 %
Eosinophils Absolute: 0.3 10*3/uL (ref 0.0–0.5)
Eosinophils Relative: 4 %
HCT: 41.8 % (ref 36.0–46.0)
Hemoglobin: 14.1 g/dL (ref 12.0–15.0)
Immature Granulocytes: 0 %
Lymphocytes Relative: 40 %
Lymphs Abs: 2.9 10*3/uL (ref 0.7–4.0)
MCH: 29 pg (ref 26.0–34.0)
MCHC: 33.7 g/dL (ref 30.0–36.0)
MCV: 85.8 fL (ref 80.0–100.0)
Monocytes Absolute: 0.6 10*3/uL (ref 0.1–1.0)
Monocytes Relative: 8 %
Neutro Abs: 3.5 10*3/uL (ref 1.7–7.7)
Neutrophils Relative %: 48 %
Platelets: 216 10*3/uL (ref 150–400)
RBC: 4.87 MIL/uL (ref 3.87–5.11)
RDW: 12.5 % (ref 11.5–15.5)
WBC: 7.4 10*3/uL (ref 4.0–10.5)
nRBC: 0 % (ref 0.0–0.2)

## 2021-06-03 LAB — MAGNESIUM: Magnesium: 1.8 mg/dL (ref 1.7–2.4)

## 2021-06-03 LAB — GLUCOSE, CAPILLARY
Glucose-Capillary: 369 mg/dL — ABNORMAL HIGH (ref 70–99)
Glucose-Capillary: 197 mg/dL — ABNORMAL HIGH (ref 70–99)
Glucose-Capillary: 220 mg/dL — ABNORMAL HIGH (ref 70–99)
Glucose-Capillary: 268 mg/dL — ABNORMAL HIGH (ref 70–99)

## 2021-06-03 LAB — PHOSPHORUS: Phosphorus: 4.3 mg/dL (ref 2.5–4.6)

## 2021-06-03 MED ORDER — INSULIN ASPART 100 UNIT/ML IJ SOLN
0.0000 [IU] | Freq: Three times a day (TID) | INTRAMUSCULAR | Status: DC
  Administered 2021-06-03: 3 [IU] via SUBCUTANEOUS
  Administered 2021-06-03: 2 [IU] via SUBCUTANEOUS
  Administered 2021-06-04: 9 [IU] via SUBCUTANEOUS

## 2021-06-03 MED ORDER — BENZOCAINE 10 % MT GEL
Freq: Four times a day (QID) | OROMUCOSAL | Status: DC | PRN
  Administered 2021-06-04: 1 via OROMUCOSAL
  Filled 2021-06-03: qty 9

## 2021-06-03 MED ORDER — INSULIN DETEMIR 100 UNIT/ML ~~LOC~~ SOLN
13.0000 [IU] | Freq: Two times a day (BID) | SUBCUTANEOUS | Status: DC
  Administered 2021-06-03 (×2): 13 [IU] via SUBCUTANEOUS
  Filled 2021-06-03 (×4): qty 0.13

## 2021-06-03 MED ORDER — INSULIN ASPART 100 UNIT/ML IJ SOLN
0.0000 [IU] | Freq: Every day | INTRAMUSCULAR | Status: DC
  Administered 2021-06-03: 3 [IU] via SUBCUTANEOUS
  Administered 2021-06-04: 4 [IU] via SUBCUTANEOUS

## 2021-06-03 MED ORDER — INSULIN ASPART 100 UNIT/ML IJ SOLN
5.0000 [IU] | Freq: Three times a day (TID) | INTRAMUSCULAR | Status: DC
Start: 2021-06-03 — End: 2021-06-04
  Administered 2021-06-03 (×2): 5 [IU] via SUBCUTANEOUS

## 2021-06-03 NOTE — Progress Notes (Signed)
Pt complained of sore inside of mouth and that it was painful. Assessed and appeared with mild swelling on the outside of lip with white capped sore on inside of lip. MD made aware see new orders.   Pt called out and said she felt very anxious like she was going to have a panic attack. Prn given. Pt called out and stated she was having a panic attack, pt hyperventilating and scratching up and down arms and said it felt like spiders crawling over her body. Other prn for anxiety given and effective. Encouraged to deep breathe and to watch her tablet.  Entered room to assess pt and noted she was grinding her teeth.

## 2021-06-03 NOTE — Progress Notes (Signed)
PROGRESS NOTE    Julie [name]  FTD:[id number] DOB: Jul 21, 1991 DOA: 05/28/2021 PCP: Health, [facility]    Brief Narrative:  Julie [name] is a 30 year old female with past medical history significant for type 1 diabetes mellitus, depression/anxiety, chronic low back pain s/p lumbar laminectomy, insomnia who presented to [facility] ED on 9/8 for evaluation of high blood sugar, chest pain, nausea/vomiting.  Patient reports has been taking 15 units of Levemir every night and 6 units of NovoLog 3 times daily but her glucometer continues to read "high" for the past 2 weeks.  Patient also reports severe sharp central chest pain worse when she tries to eat or swallow.  Patient also with 2 episodes of blood tinged sputum roughly 1 week ago with associated shortness of breath and mild cough.  Patient now unable to eat anything about last week due to chest pain, nausea/vomiting.  Denies extremity swelling/tenderness, no abdominal pain.  Denies diarrhea, no melena, no hematochezia.  On arrival to the ED, patient asked for more IV Dilaudid and IV Phenergan.  In the ED at [facility], patient found to be afebrile, saturating well on room air.  Tachycardic to the 130s with stable blood pressure.  CTA chest negative for PE, no acute cardiopulmonary disease process.  Glucose elevated 619, bicarbonate 15, anion gap 17.  High sensitive troponin negative x2.  Lipase within normal limits.  Urinalysis with glucosuria and ketonuria.  Patient was given 1.5 L of LR, IV potassium, 1 mg IV Dilaudid, and started on IV insulin infusion.  Patient was transferred to [facility] for ongoing evaluation and management.   Assessment & Plan:   Principal Problem:   DKA, type 1 (HCC) Active Problems:   Depression with anxiety   Chest pain   Abdominal pain, epigastric   Dysphagia   Diabetic ketoacidosis Type 1 diabetes mellitus Patient initially presented to med [facility] with  nausea/vomiting and poorly controlled glucose.  On arrival, patient was noted to have an elevated glucose of 619 with elevated anion gap.  Initially started on insulin drip which has now been transitioned to subcutaneous insulin. --Hemoglobin A1c 11.7, poorly controlled --Diabetic educator following, appreciate assistance --Levemir incerased to 13u BID --NovoLog increased to 5u [facility] --[facility] for further coverage --CBGs qAC/HS  Dysphagia/odynophagia Suspected gastroparesis Patient is reported significant nausea/vomiting on admission.  Etiology likely secondary to poorly controlled diabetes in the setting of DKA as above.  Also with high suspicion of diabetic gastroparesis in the setting of chronically poorly controlled diabetes.  Patient underwent EGD with findings unrevealing, negative for H. pylori.  GI now signed off. --Avoid narcotics; recommend use of lidocaine patch versus Voltaren gel --Protonix 40 mg p.o. twice daily --Carafate 1 g p.o. TIDAC --Reglan 10 mg IV every 8 hours --Consider gastric emptying study outpatient --Outpatient follow-up with GI 1-2 months  Atypical chest pain On arrival to med [facility], patient complaining of chest pain.  EKG with no dynamic changes.  High sensitive troponin negative.  Etiology likely noncardiac.  No further work-up.  Depression/anxiety Insomnia --BuSpar 15 mg p.o. 3 times daily --Atarax 25 mg 3 times daily as needed --Ambien 5 mg nightly as needed  Chronic back pain with radiculopathy Patient with history of lumbar laminectomy.  Lumbar x-ray with no acute osseous abnormality, mild to space narrowing L5-S1.  Left hip x-ray negative for acute process. --Oxycodone 10 mg p.o. every 8 hours as needed moderate pain --Lidocaine patch --Gabapentin 4 mg p.o. 3 times  daily --Voltaren gel 2 g topical 4 times daily  Hypophosphatemia Repleted during hospitalization.  Hypomagnesemia Repleted during hospitalization  Hyponatremia Etiology  likely secondary to volume depletion versus pseudohyponatremia in the setting of elevated glucose.  Now resolved.  Tobacco use disorder --Counseled on need for complete tobacco cessation  Malingering/drug-seeking behavior Patient continues to request IV Dilaudid/IV Phenergan.  Patient was seen by psychiatry with high suspicion for secondary gain as she continues to request IV pain medication, antiemetics and ADHD medication.  Also documented in physical therapy note 9/11 that patient shared that her mom is currently caring for her children but her mom will not assist if she is not in the hospital. --Avoid narcotics if able, especially no IV narcotics --No IV Phenergan   DVT prophylaxis: enoxaparin (LOVENOX) injection 40 mg Start: 05/29/21 1000   Code Status: Full Code  Family Communication: no family present at bedside this morning  Disposition Plan:  Level of care: Med-Surg Status is: Inpatient  Remains inpatient appropriate because:Unsafe d/c plan, IV treatments appropriate due to intensity of illness or inability to take PO, and Inpatient level of care appropriate due to severity of illness  Dispo: The patient is from: Home              Anticipated d/c is to: Home              Patient currently is not medically stable to d/c.   Difficult to place patient No  Consultants:  Gastroenterology  - signed off 9/14 Psychiatry  Procedures:  EGD 9/10  Antimicrobials:  none   Subjective: Patient seen examined bedside, resting comfortably.  RN present.  Patient with large volume of food on her bedside table, actively eating.  Denies any further nausea/vomiting this morning.  But does report that she had large amounts of vomiting yesterday.  Glucose poorly controlled, glucose 446 this AM.  Titrating up insulin.  No other questions or concerns at this time.  Denies headache, no dizziness, no current chest pain, no palpitations, no shortness of breath, no abdominal pain.  No acute events  overnight per nursing staff.  Objective: Vitals:   06/02/21 1925 06/03/21 0621 06/03/21 0819 06/03/21 1300  BP: (!) 135/107  (!) 143/44 117/85  Pulse: (!) 124   (!) 107  Resp: 19 16  18   Temp: 99 F (37.2 C)   98.9 F (37.2 C)  TempSrc: Oral   Oral  SpO2: 99%   95%  Weight:      Height:       No intake or output data in the 24 hours ending 06/03/21 1549 Filed Weights   05/28/21 1858  Weight: 74.8 kg    Examination:  General exam: Appears calm and comfortable, appears slightly older than stated age Respiratory system: Clear to auscultation. Respiratory effort normal.  On room air Cardiovascular system: S1 & S2 heard, RRR. No JVD, murmurs, rubs, gallops or clicks. No pedal edema. Gastrointestinal system: Abdomen is nondistended, soft and nontender. No organomegaly or masses felt. Normal bowel sounds heard. Central nervous system: Alert and oriented. No focal neurological deficits. Extremities: Symmetric 5 x 5 power. Skin: No rashes, lesions or ulcers Psychiatry: Judgement and insight appear poor. Mood & affect appropriate.     Data Reviewed: I have personally reviewed following labs and imaging studies  CBC: Recent Labs  Lab 05/29/21 1018 05/30/21 0133 05/31/21 0354 06/01/21 0217 06/02/21 0608 06/03/21 0223  WBC 6.4 5.8 7.7 6.7 6.1 7.4  NEUTROABS 2.5 2.1  --  2.9 2.9 3.5  HGB 13.1 13.5 12.1 12.9 13.0 14.1  HCT 38.5 40.1 36.4 38.7 39.5 41.8  MCV 84.8 86.1 86.5 86.8 86.8 85.8  PLT 216 188 171 188 196 216   Basic Metabolic Panel: Recent Labs  Lab 05/30/21 0133 05/31/21 0354 05/31/21 0403 06/01/21 0217 06/02/21 0608 06/03/21 0223  NA 134* 136  --  138 137 131*  K 5.2* 4.1  --  4.6 4.3 5.5*  CL 104 108  --  104 101 98  CO2 23 23  --  29 31 25   GLUCOSE 345* 278*  --  139* 265* 446*  BUN 5* 6  --  6 <5* 8  CREATININE 0.50 0.51  --  0.62 0.53 0.62  CALCIUM 8.6* 8.4*  --  8.5* 8.8* 8.8*  MG 1.9  --  1.6* 2.0 1.6* 1.8  PHOS 3.1  --  3.7 4.2 4.4 4.3    GFR: Estimated Creatinine Clearance: 114.9 mL/min (by C-G formula based on SCr of 0.62 mg/dL). Liver Function Tests: Recent Labs  Lab 05/29/21 1018 05/30/21 0133 06/01/21 0217 06/02/21 0608 06/03/21 0223  AST 15 27 27 17 21   ALT 11 10 11 11 10   ALKPHOS 72 72 59 66 67  BILITOT 0.7 0.9 0.7 0.5 0.3  PROT 5.6* 5.7* 5.6* 5.8* 6.2*  ALBUMIN 2.8* 2.7* 2.8* 2.7* 2.8*   Recent Labs  Lab 05/28/21 1937  LIPASE 19   No results for input(s): AMMONIA in the last 168 hours. Coagulation Profile: No results for input(s): INR, PROTIME in the last 168 hours. Cardiac Enzymes: No results for input(s): CKTOTAL, CKMB, CKMBINDEX, TROPONINI in the last 168 hours. BNP (last 3 results) No results for input(s): PROBNP in the last 8760 hours. HbA1C: No results for input(s): HGBA1C in the last 72 hours. CBG: Recent Labs  Lab 06/02/21 1255 06/02/21 1546 06/02/21 1928 06/03/21 0809 06/03/21 1257  GLUCAP 310* 121* 206* 369* 197*   Lipid Profile: No results for input(s): CHOL, HDL, LDLCALC, TRIG, CHOLHDL, LDLDIRECT in the last 72 hours. Thyroid Function Tests: No results for input(s): TSH, T4TOTAL, FREET4, T3FREE, THYROIDAB in the last 72 hours. Anemia Panel: No results for input(s): VITAMINB12, FOLATE, FERRITIN, TIBC, IRON, RETICCTPCT in the last 72 hours. Sepsis Labs: No results for input(s): PROCALCITON, LATICACIDVEN in the last 168 hours.  Recent Results (from the past 240 hour(s))  Resp Panel by RT-PCR (Flu A&B, Covid) Nasopharyngeal Swab     Status: None   Collection Time: 05/28/21  7:37 PM   Specimen: Nasopharyngeal Swab; Nasopharyngeal(NP) swabs in vial transport medium  Result Value Ref Range Status   SARS Coronavirus 2 by RT PCR NEGATIVE NEGATIVE Final    Comment: (NOTE) SARS-CoV-2 target nucleic acids are NOT DETECTED.  The SARS-CoV-2 RNA is generally detectable in upper respiratory specimens during the acute phase of infection. The lowest concentration of SARS-CoV-2 viral  copies this assay can detect is 138 copies/mL. A negative result does not preclude SARS-Cov-2 infection and should not be used as the sole basis for treatment or other patient management decisions. A negative result may occur with  improper specimen collection/handling, submission of specimen other than nasopharyngeal swab, presence of viral mutation(s) within the areas targeted by this assay, and inadequate number of viral copies(<138 copies/mL). A negative result must be combined with clinical observations, patient history, and epidemiological information. The expected result is Negative.  Fact Sheet for Patients:  06/05/21  Fact Sheet for Healthcare Providers:  06/05/21  This test is no t  yet approved or cleared by the [place] and  has been authorized for detection and/or diagnosis of SARS-CoV-2 by FDA under an Emergency Use Authorization (EUA). This EUA will remain  in effect (meaning this test can be used) for the duration of the COVID-19 declaration under Section 564(b)(1) of the Act, 21 U.S.C.section 360bbb-3(b)(1), unless the authorization is terminated  or revoked sooner.       Influenza A by PCR NEGATIVE NEGATIVE Final   Influenza B by PCR NEGATIVE NEGATIVE Final    Comment: (NOTE) The Xpert Xpress SARS-CoV-2/FLU/RSV plus assay is intended as an aid in the diagnosis of influenza from Nasopharyngeal swab specimens and should not be used as a sole basis for treatment. Nasal washings and aspirates are unacceptable for Xpert Xpress SARS-CoV-2/FLU/RSV testing.  Fact Sheet for Patients: BloggerCourse.com  Fact Sheet for Healthcare Providers: SeriousBroker.it  This test is not yet approved or cleared by the [place] FDA and has been authorized for detection and/or diagnosis of SARS-CoV-2 by FDA under an Emergency Use Authorization (EUA). This  EUA will remain in effect (meaning this test can be used) for the duration of the COVID-19 declaration under Section 564(b)(1) of the Act, 21 U.S.C. section 360bbb-3(b)(1), unless the authorization is terminated or revoked.  Performed at [facility], [address] [place], [place] [address]          Radiology Studies: DG Orthopantogram  Result Date: 06/01/2021 CLINICAL DATA:  Broken tooth EXAM: ORTHOPANTOGRAM/PANORAMIC COMPARISON:  11/03/2012 FINDINGS: Panorex view of the mandible was performed. There are no acute bony abnormalities. 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 Remaining visualize teeth are unremarkable. Visualized sinuses are clear. IMPRESSION: 1. 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 Otherwise unremarkable exam. Electronically Signed   By: Sharlet Salina M.D.   On: 06/01/2021 21:42        Scheduled Meds:  alum & mag hydroxide-simeth  10 mL Oral TID AC   diclofenac Sodium  2 g Topical QID   enoxaparin (LOVENOX) injection  40 mg Subcutaneous Daily   gabapentin  400 mg Oral TID   influenza vac split quadrivalent PF  0.5 mL Intramuscular Tomorrow-1000   insulin aspart  0-5 Units Subcutaneous QHS   insulin aspart  0-9 Units Subcutaneous TID WC   insulin aspart  5 Units Subcutaneous TID WC   insulin detemir  13 Units Subcutaneous BID   lidocaine  1 patch Transdermal Q24H   lidocaine  15 mL Mouth/Throat TID AC & HS   lisdexamfetamine  40 mg Oral Daily   metoCLOPramide (REGLAN) injection  10 mg Intravenous Q8H   pantoprazole (PROTONIX) IV  40 mg Intravenous Q12H   scopolamine  1 patch Transdermal Q72H   sucralfate  1 g Oral TID WC & HS   Continuous Infusions:   LOS: 5 days    Time spent: 39 minutes spent on chart review, discussion with nursing staff, consultants, updating family and interview/physical exam; more than 50% of that time was spent in counseling and/or coordination of care.    Alvira Philips [place], DO [facility] Available via Epic secure chat 7am-7pm After these hours, please refer to coverage provider listed on [email or web address] 06/03/2021, 3:49 PM

## 2021-06-03 NOTE — TOC Progression Note (Signed)
Transition of Care [facility]) - Progression Note    Patient Details  Name: Julie [name] MRN: [id number] Date of Birth: 07/13/1991  [facility] CM/SW Contact  Lorri Frederick, LCSW Phone Number: 06/03/2021, 10:58 AM  Clinical Narrative:   [facility] accepts referral.     Expected Discharge Plan: Home w Home Health Services Barriers to Discharge: Continued Medical Work up  Expected Discharge Plan and Services Expected Discharge Plan: Home w Home Health Services In-house Referral: Clinical Social Work   Post Acute Care Choice: Home Health Living arrangements for the past 2 months: Single Family Home                           HH Arranged: PT, OT HH Agency: [facility] Date HH Agency Contacted: 06/03/21 Time HH Agency Contacted: 1054 Representative spoke with at [facility] Agency: Andrez Grime   Social Determinants of Health (SDOH) Interventions    Readmission Risk Interventions No flowsheet data found.

## 2021-06-03 NOTE — Progress Notes (Signed)
Daily Rounding Note  06/03/2021, 2:05 PM  LOS: 5 days   SUBJECTIVE:  States that she had some vomiting yesterday. Chest discomfort and swallowing still feel about the same. States that viscous lidocaine tastes nasty. Has been trying Voltaren gel which appears to be helping some  OBJECTIVE:         Vital signs in last 24 hours:    Temp:  [98.9 F (37.2 C)-99 F (37.2 C)] 98.9 F (37.2 C) (09/14 1300) Pulse Rate:  [107-124] 107 (09/14 1300) Resp:  [16-19] 18 (09/14 1300) BP: (117-143)/(44-107) 117/85 (09/14 1300) SpO2:  [95 %-99 %] 95 % (09/14 1300) Last BM Date: 05/28/21 Filed Weights   05/28/21 1858  Weight: 74.8 kg   General: Appears comfortable sitting in chair Heart: regular rate Chest: no increased WOB. Tender over mid chest to palpation Abdomen: Soft. Mild discomfort Extremities: No edema Neuro/Psych: Appropriate  Intake/Output from previous day: No intake/output data recorded.  Intake/Output this shift: No intake/output data recorded.  Lab Results: Recent Labs    06/01/21 0217 06/02/21 0608 06/03/21 0223  WBC 6.7 6.1 7.4  HGB 12.9 13.0 14.1  HCT 38.7 39.5 41.8  PLT 188 196 216   BMET Recent Labs    06/01/21 0217 06/02/21 0608 06/03/21 0223  NA 138 137 131*  K 4.6 4.3 5.5*  CL 104 101 98  CO2 29 31 25   GLUCOSE 139* 265* 446*  BUN 6 <5* 8  CREATININE 0.62 0.53 0.62  CALCIUM 8.5* 8.8* 8.8*   LFT Recent Labs    06/01/21 0217 06/02/21 0608 06/03/21 0223  PROT 5.6* 5.8* 6.2*  ALBUMIN 2.8* 2.7* 2.8*  AST 27 17 21   ALT 11 11 10   ALKPHOS 59 66 67  BILITOT 0.7 0.5 0.3   PT/INR No results for input(s): LABPROT, INR in the last 72 hours. Hepatitis Panel No results for input(s): HEPBSAG, HCVAB, HEPAIGM, HEPBIGM in the last 72 hours.  Studies/Results: DG Orthopantogram  Result Date: 06/01/2021 CLINICAL DATA:  Broken tooth EXAM: ORTHOPANTOGRAM/PANORAMIC COMPARISON:  11/03/2012  FINDINGS: Panorex view of the mandible was performed. There are no acute bony abnormalities. 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 Remaining visualize teeth are unremarkable. Visualized sinuses are clear. IMPRESSION: 1. 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 Otherwise unremarkable exam. Electronically Signed   By: M.D.   On: 06/01/2021 21:42    ASSESMENT:     Persistent dysphagia.  EGD a few d ago w normal, non-strictured esophagus, gastritis.  EGD showed esophageal bx with reactive squamous mucosa, stomach with mild chronic gastritis (negative for H pylori), benign duodenal mucosa     IDDM, type 1.  A1c 11.7 at admission.       Anxiety.   Note that psychiatry is wondering whether this is somatization or secondary gain since her mother is now responsible for watching the patient's 3 children.   PLAN   Encouraged use of antiemetics prior to eating. Continue to try to wean off narcotics which can create an achalasia type picture.  Continue carafate, reglan, PPI BID. Try to re-emphasize trying lidocaine patch and/or voltaren gel over chest Recommend improved control of blood sugars as lability in blood sugars can lead to N&V Can consider gastric emptying study as an outpatient. In the meantime can trial gastroparesis carb controlled diet GI will sign off for now. She can follow up with GI as an outpatient if she is interested in 1-2 months  11/05/2012  Leonides Schanz, MD

## 2021-06-03 NOTE — Progress Notes (Signed)
Inpatient Diabetes Program Recommendations  AACE/ADA: New Consensus Statement on Inpatient Glycemic Control   Target Ranges:  Prepandial:   less than 140 mg/dL      Peak postprandial:   less than 180 mg/dL (1-2 hours)      Critically ill patients:  140 - 180 mg/dL   Results for AMARE, KONTOS (MRN [id number]) as of 06/03/2021 08:19  Ref. Range 06/02/2021 08:17 06/02/2021 12:55 06/02/2021 15:46 06/02/2021 19:28 06/03/2021 08:09  Glucose-Capillary Latest Ref Range: 70 - 99 mg/dL 250 (H) 037 (H) 048 (H) 206 (H) 369 (H)   Review of Glycemic Control  Diabetes history: DM 1 Outpatient Diabetes medications: Levemir 35 units daily, Novolog 6 units tid with meals Current orders for Inpatient glycemic control: Levemir 10 units BID, Novolog 0-9 units TID with meals, Novolog 4 units TID with meals   Inpatient Diabetes Program Recommendations:    Insulin: Please consider increasing Levemir to 13 units BID, meal coverage to Novolog 5 units TID with meals, and adding Novolog 0-5 units QHS.  Thanks, Orlando Penner, RN, MSN, CDE Diabetes Coordinator Inpatient Diabetes Program [phone] (Team Pager from 8am to 5pm)

## 2021-06-04 LAB — BASIC METABOLIC PANEL
Anion gap: 6 (ref 5–15)
BUN: 9 mg/dL (ref 6–20)
CO2: 29 mmol/L (ref 22–32)
Calcium: 8.9 mg/dL (ref 8.9–10.3)
Chloride: 98 mmol/L (ref 98–111)
Creatinine, Ser: 0.63 mg/dL (ref 0.44–1.00)
GFR, Estimated: >60 mL/min (ref >60)
Glucose, Bld: 376 mg/dL — ABNORMAL HIGH (ref 70–99)
Potassium: 5.3 mmol/L — ABNORMAL HIGH (ref 3.5–5.1)
Sodium: 133 mmol/L — ABNORMAL LOW (ref 135–145)

## 2021-06-04 LAB — GLUCOSE, CAPILLARY
Glucose-Capillary: 364 mg/dL — ABNORMAL HIGH (ref 70–99)
Glucose-Capillary: 418 mg/dL — ABNORMAL HIGH (ref 70–99)
Glucose-Capillary: 184 mg/dL — ABNORMAL HIGH (ref 70–99)
Glucose-Capillary: 356 mg/dL — ABNORMAL HIGH (ref 70–99)

## 2021-06-04 LAB — MAGNESIUM: Magnesium: 1.8 mg/dL (ref 1.7–2.4)

## 2021-06-04 MED ORDER — INSULIN ASPART 100 UNIT/ML IJ SOLN
20.0000 [IU] | Freq: Once | INTRAMUSCULAR | Status: AC
  Administered 2021-06-04: 20 [IU] via SUBCUTANEOUS

## 2021-06-04 MED ORDER — INSULIN ASPART 100 UNIT/ML IJ SOLN
0.0000 [IU] | Freq: Three times a day (TID) | INTRAMUSCULAR | Status: DC
  Administered 2021-06-04: 3 [IU] via SUBCUTANEOUS
  Administered 2021-06-05: 8 [IU] via SUBCUTANEOUS

## 2021-06-04 MED ORDER — INSULIN ASPART 100 UNIT/ML IJ SOLN: 0.0000 [IU] | Freq: Every day | INTRAMUSCULAR | Status: DC

## 2021-06-04 MED ORDER — INSULIN ASPART 100 UNIT/ML IJ SOLN
6.0000 [IU] | Freq: Three times a day (TID) | INTRAMUSCULAR | Status: DC
Start: 2021-06-04 — End: 2021-06-05
  Administered 2021-06-04 – 2021-06-05 (×2): 6 [IU] via SUBCUTANEOUS

## 2021-06-04 MED ORDER — INSULIN DETEMIR 100 UNIT/ML ~~LOC~~ SOLN
15.0000 [IU] | Freq: Two times a day (BID) | SUBCUTANEOUS | Status: DC
  Administered 2021-06-04 – 2021-06-05 (×3): 15 [IU] via SUBCUTANEOUS
  Filled 2021-06-04 (×4): qty 0.15

## 2021-06-04 MED ORDER — PANTOPRAZOLE SODIUM 40 MG PO TBEC
40.0000 mg | DELAYED_RELEASE_TABLET | Freq: Two times a day (BID) | ORAL | Status: DC
  Administered 2021-06-04 – 2021-06-05 (×3): 40 mg via ORAL
  Filled 2021-06-04 (×3): qty 1

## 2021-06-04 NOTE — Plan of Care (Signed)
  Problem: Health Behavior/Discharge Planning: Goal: Ability to manage health-related needs will improve Outcome: Progressing   Problem: Activity: Goal: Risk for activity intolerance will decrease Outcome: Progressing   Problem: Nutrition: Goal: Adequate nutrition will be maintained Outcome: Progressing   

## 2021-06-04 NOTE — Plan of Care (Signed)
  Problem: Activity: Goal: Risk for activity intolerance will decrease Outcome: Progressing   Problem: Nutrition: Goal: Adequate nutrition will be maintained Outcome: Progressing   Problem: Coping: Goal: Level of anxiety will decrease Outcome: Progressing   Problem: Elimination: Goal: Will not experience complications related to bowel motility Outcome: Progressing   Problem: Pain Managment: Goal: General experience of comfort will improve Outcome: Progressing   

## 2021-06-04 NOTE — Progress Notes (Signed)
Physical Therapy Treatment Patient Details Name: Julie [name] MRN: [id number] DOB: 1991/02/25 Today's Date: 06/04/2021   History of Present Illness 30 y.o. female admitted 9/8 with DKA, CP and N/V. Pt with insulin pump which recently broke. EDG 9/10. Pt reports fall 9/10.  PMHx: DM type 1, anxiety, insomnia, chronic low back pain status post lumbar laminectomy.    PT Comments    Pt tolerated treatment well and mentioned anxiety with ambulation. Pt mentioned decreased caregiver support at home and having to pay mother to watch kids when out of hospital. Pt progressed to ambulating without AD, demonstrating increased foot drag. When provided 1 person HHA acting as cane, pt demonstrated decreased weight shift to L. Requested ambulation with a quad cane and ambulated in room with single point and quad cane. Pt's gait remained similar, with foot drag on L. With quad cane, pt able to better achieve foot clearance. Also, demonstrated foot clearance in a seated position with compensation from hip flexors over food tray. Pt requests more practice with quad cane in upcoming sessions. Pt has not demonstrated significant change in gait with quad cane vs. RW. Continue to recommend RW upon d/c.   Recommendations for follow up therapy are one component of a multi-disciplinary discharge planning process, led by the attending physician.  Recommendations may be updated based on patient status, additional functional criteria and insurance authorization.  Follow Up Recommendations  Home health PT     Equipment Recommendations  None recommended by PT    Recommendations for Other Services       Precautions / Restrictions Precautions Precautions: Fall Restrictions Weight Bearing Restrictions: No     Mobility  Bed Mobility Overal bed mobility: Modified Independent Bed Mobility: Supine to Sit           General bed mobility comments: Bed mobility mod I from flat bed with use of bed  rails Patient Response: Anxious  Transfers Overall transfer level: Modified independent Equipment used: None   Sit to Stand: Modified independent (Device/Increase time)         General transfer comment: Sit to stand x3 from bed, toilet, and recliner all mod I with increased time. Use of rails in bathroom  Ambulation/Gait Ambulation/Gait assistance: Min assist Gait Distance (Feet): 175 Feet Assistive device: 1 person hand held assist;Quad cane;Straight cane Gait Pattern/deviations: Step-to pattern;Decreased stride length Gait velocity: Decreased Gait velocity interpretation: <1.8 ft/sec, indicate of risk for recurrent falls General Gait Details: Pt ambulated with 1 person first 42' with 1 person HHA and min A for greater weight shift to L side. Pt did not push weight into HHA therefore, pt ambulated 15' without support on L side. Pt demonstrated increased L foot drag without support on L side and ambulated an additional 75 with 1 person HHA on L. Then, pt ambulated in room trying different canes: straight cane and quad cane. Pt showed increased steadiness with quad cane with foot drag still apparent, but able to achieve foot clearance. Pt stated she would like to continue to practice with quad cane in upcoming sessions.   Stairs             Wheelchair Mobility    Modified Rankin (Stroke Patients Only)       Balance Overall balance assessment: History of Falls;Needs assistance   Sitting balance-Leahy Scale: Good Sitting balance - Comments: Sat EOB and toilet without assist   Standing balance support: No upper extremity supported;Single extremity supported Standing balance-Leahy Scale: Fair Standing balance comment: Able to  maintain static balance without support but requires uni UE support on L to maintain dynamic balance.             High level balance activites: Side stepping High Level Balance Comments: Min A with uni UE support            Cognition  Arousal/Alertness: Awake/alert Behavior During Therapy: WFL for tasks assessed/performed Overall Cognitive Status: Within Functional Limits for tasks assessed                                 General Comments: Pt follows commands accurately      Exercises General Exercises - Lower Extremity Long Arc Quad: AAROM;Left;5 reps;Seated    General Comments General comments (skin integrity, edema, etc.): VSS on RA. Pt stated feeling dizzy at the end of ambulation. Dizziness ceased when seated.      Pertinent Vitals/Pain Faces Pain Scale: Hurts a little bit Pain Location: L LE and lumbar spine Pain Descriptors / Indicators: Aching;Grimacing;Sore Pain Intervention(s): Monitored during session;Premedicated before session    Home Living                      Prior Function            PT Goals (current goals can now be found in the care plan section) Progress towards PT goals: Progressing toward goals    Frequency    Min 3X/week      PT Plan Current plan remains appropriate    Co-evaluation              AM-PAC PT "6 Clicks" Mobility   Outcome Measure  Help needed turning from your back to your side while in a flat bed without using bedrails?: None Help needed moving from lying on your back to sitting on the side of a flat bed without using bedrails?: None Help needed moving to and from a bed to a chair (including a wheelchair)?: A Little Help needed standing up from a chair using your arms (e.g., wheelchair or bedside chair)?: A Little Help needed to walk in hospital room?: A Little Help needed climbing 3-5 steps with a railing? : A Lot 6 Click Score: 19    End of Session Equipment Utilized During Treatment: Gait belt Activity Tolerance: Patient limited by pain Patient left: with call bell/phone within reach;in chair;with chair alarm set Nurse Communication: Mobility status Pain - Right/Left: Left Pain - part of body: Leg (back)     Time:  3335-4562 PT Time Calculation (min) (ACUTE ONLY): 32 min  Charges:  $Gait Training: 23-37 mins                     Velda Shell, SPT Acute Rehab: [phone]    Vance Gather 06/04/2021, 1:22 PM

## 2021-06-04 NOTE — Progress Notes (Signed)
PROGRESS NOTE    Helyn Schwan  GLO:[id number] DOB: 1991-07-08 DOA: 05/28/2021 PCP: [facility], [facility]    Brief Narrative:   Hadassa Cermak is a 30 year old female with past medical history significant for type 1 diabetes mellitus, depression/anxiety, chronic low back pain s/p lumbar laminectomy, insomnia  presented to the hospital on 06/07/2021 with concerns for elevated blood glucose levels, chest pain, nausea, vomiting.  Patient reported taking  15 units of Levemir every night and 6 units of NovoLog 3 times daily but her glucometer continues to read high.  She also complained of chest pain and difficulty swallowing.  She had episodes of nausea and vomiting and was unable to tolerate oral diet.  In the ED patient was noted to be tachycardic.    CTA chest negative for PE, no acute cardiopulmonary disease process.  Glucose was elevated at 619, bicarbonate 15, anion gap 17.  High sensitive troponin negative x2.  Lipase was within normal limits.  Urinalysis with glucosuria and ketonuria.  Patient was given 1.5 L of LR, IV potassium, 1 mg IV Dilaudid, and started on IV insulin infusion.  Patient was transferred to [facility] for ongoing evaluation and management.   Assessment & Plan:   Principal Problem:   DKA, type 1 (HCC) Active Problems:   Depression with anxiety   Chest pain   Abdominal pain, epigastric   Dysphagia   Diabetic ketoacidosis with history of Type 1 diabetes mellitus Patient with episodes of nausea vomiting and epigastric pain.  Still with extreme hyperglycemia.  We will increase moderate scale sliding scale insulin, NovoLog with meals and long-acting insulin dose has been increased as well.  Hemoglobin A1c 11.7, poorly controlled.    Dysphagia/odynophagia/Suspected gastroparesis Thought to be secondary to gastroparesis.  Patient underwent EGD with findings unrevealing, negative for H. pylori.  GI now signed off.  On Vicodin.  Try to avoid  narcotics if possible.  Continue Protonix 40 twice daily, Carafate, Reglan IV.  Consider gastric emptying study as outpatient.  Patient will need to follow-up with GI as outpatient in 1 to 2 months  Atypical chest pain Noncardiac chest pain.  EKG with no dynamic changes.  High sensitive troponin negative.   No further work-up.  Depression/anxiety/Insomnia On atarax, ambien  Chronic back pain with radiculopathy Patient with history of lumbar laminectomy.  Lumbar x-ray with no acute osseous abnormality, mild to space narrowing L5-S1.  Left hip xray without acute findings.  Continue oxycodone, Lidoderm patch, gabapentin, Voltaren gel  Mild hyperkalemia.  We will continue to monitor.  Hypophosphatemia Repleted during hospitalization.  Phosphorus of 4.3.  Hypomagnesemia Repleted during hospitalization.  Latest magnesium of 1.8.  Hyponatremia Likely pseudohyponatremia.  Sodium of 131.  Tobacco use disorder Need to quit smoking  Malingering/drug-seeking behavior Patient has requesting IV narcotics and Phenergan.  Seen by psychiatry and high suspicion for secondary gain.    DVT prophylaxis: enoxaparin (LOVENOX) injection 40 mg Start: 05/29/21 1000    Code Status: Full Code   Family Communication:  none  Disposition Plan:   Status is: Inpatient  Remains inpatient appropriate because:Unsafe d/c plan, IV treatments appropriate due to intensity of illness or inability to take PO, and Inpatient level of care appropriate due to severity of illness  Dispo: The patient is from: Home              Anticipated d/c is to: Home              Patient currently is not medically stable  to d/c.   Difficult to place patient No  Consultants:  Gastroenterology  - signed off 9/14 Psychiatry  Procedures:  EGD 05/30/21  Antimicrobials:  none  Subjective: Today, patient was seen and examined at bedside.  Complains of multiple complaints including chest pain retrosternal discomfort back pain.   Stated that she had multiple episodes of vomiting yesterday and requesting IV Phenergan and pain medication, blood glucose levels elevated this morning.  Objective: Vitals:   06/03/21 0819 06/03/21 1300 06/03/21 2005 06/04/21 0549  BP: (!) 143/44 117/85 115/84 (!) 91/59  Pulse:  (!) 107 (!) 110 84  Resp:  18 20 20   Temp:  98.9 F (37.2 C)  (!) 97.4 F (36.3 C)  TempSrc:  Oral  Oral  SpO2:  95% 99% 99%  Weight:      Height:       No intake or output data in the 24 hours ending 06/04/21 1354 Filed Weights   05/28/21 1858  Weight: 74.8 kg    Physical examination: General:  Average built, not in obvious distress HENT:   No scleral pallor or icterus noted. Oral mucosa is moist.  Chest:  Clear breath sounds.  Diminished breath sounds bilaterally. No crackles or wheezes.  CVS: S1 &S2 heard. No murmur.  Regular rate and rhythm. Abdomen: Soft, mild nonspecific tenderness on the epigastric region, nondistended.  Bowel sounds are heard.   Extremities: No cyanosis, clubbing or edema.  Peripheral pulses are palpable. Psych: Alert, awake and oriented, normal mood CNS:  No cranial nerve deficits.  Power equal in all extremities.   Skin: Warm and dry.  No rashes noted.  Data Reviewed: I have personally reviewed the following labs and imaging studies.    CBC: Recent Labs  Lab 05/29/21 1018 05/30/21 0133 05/31/21 0354 06/01/21 0217 06/02/21 0608 06/03/21 0223  WBC 6.4 5.8 7.7 6.7 6.1 7.4  NEUTROABS 2.5 2.1  --  2.9 2.9 3.5  HGB 13.1 13.5 12.1 12.9 13.0 14.1  HCT 38.5 40.1 36.4 38.7 39.5 41.8  MCV 84.8 86.1 86.5 86.8 86.8 85.8  PLT 216 188 171 188 196 216    Basic Metabolic Panel: Recent Labs  Lab 05/30/21 0133 05/31/21 0354 05/31/21 0403 06/01/21 0217 06/02/21 0608 06/03/21 0223 06/04/21 0249  NA 134* 136  --  138 137 131* 133*  K 5.2* 4.1  --  4.6 4.3 5.5* 5.3*  CL 104 108  --  104 101 98 98  CO2 23 23  --  29 31 25 29   GLUCOSE 345* 278*  --  139* 265* 446* 376*  BUN  5* 6  --  6 <5* 8 9  CREATININE 0.50 0.51  --  0.62 0.53 0.62 0.63  CALCIUM 8.6* 8.4*  --  8.5* 8.8* 8.8* 8.9  MG 1.9  --  1.6* 2.0 1.6* 1.8 1.8  PHOS 3.1  --  3.7 4.2 4.4 4.3  --     GFR: Estimated Creatinine Clearance: 114.9 mL/min (by C-G formula based on SCr of 0.63 mg/dL). Liver Function Tests: Recent Labs  Lab 05/29/21 1018 05/30/21 0133 06/01/21 0217 06/02/21 0608 06/03/21 0223  AST 15 27 27 17 21   ALT 11 10 11 11 10   ALKPHOS 72 72 59 66 67  BILITOT 0.7 0.9 0.7 0.5 0.3  PROT 5.6* 5.7* 5.6* 5.8* 6.2*  ALBUMIN 2.8* 2.7* 2.8* 2.7* 2.8*    Recent Labs  Lab 05/28/21 1937  LIPASE 19    No results for input(s): AMMONIA in the last  168 hours. Coagulation Profile: No results for input(s): INR, PROTIME in the last 168 hours. Cardiac Enzymes: No results for input(s): CKTOTAL, CKMB, CKMBINDEX, TROPONINI in the last 168 hours. BNP (last 3 results) No results for input(s): PROBNP in the last 8760 hours. HbA1C: No results for input(s): HGBA1C in the last 72 hours. CBG: Recent Labs  Lab 06/03/21 1257 06/03/21 1613 06/03/21 2003 06/04/21 0752 06/04/21 1142  GLUCAP 197* 220* 268* 364* 418*    Lipid Profile: No results for input(s): CHOL, HDL, LDLCALC, TRIG, CHOLHDL, LDLDIRECT in the last 72 hours. Thyroid Function Tests: No results for input(s): TSH, T4TOTAL, FREET4, T3FREE, THYROIDAB in the last 72 hours. Anemia Panel: No results for input(s): VITAMINB12, FOLATE, FERRITIN, TIBC, IRON, RETICCTPCT in the last 72 hours. Sepsis Labs: No results for input(s): PROCALCITON, LATICACIDVEN in the last 168 hours.  Recent Results (from the past 240 hour(s))  Resp Panel by RT-PCR (Flu A&B, Covid) Nasopharyngeal Swab     Status: None   Collection Time: 05/28/21  7:37 PM   Specimen: Nasopharyngeal Swab; Nasopharyngeal(NP) swabs in vial transport medium  Result Value Ref Range Status   SARS Coronavirus 2 by RT PCR NEGATIVE NEGATIVE Final    Comment: (NOTE) SARS-CoV-2 target  nucleic acids are NOT DETECTED.  The SARS-CoV-2 RNA is generally detectable in upper respiratory specimens during the acute phase of infection. The lowest concentration of SARS-CoV-2 viral copies this assay can detect is 138 copies/mL. A negative result does not preclude SARS-Cov-2 infection and should not be used as the sole basis for treatment or other patient management decisions. A negative result may occur with  improper specimen collection/handling, submission of specimen other than nasopharyngeal swab, presence of viral mutation(s) within the areas targeted by this assay, and inadequate number of viral copies(<138 copies/mL). A negative result must be combined with clinical observations, patient history, and epidemiological information. The expected result is Negative.  Fact Sheet for Patients:  BloggerCourse.com  Fact Sheet for Healthcare Providers:  SeriousBroker.it  This test is no t yet approved or cleared by the [place] FDA and  has been authorized for detection and/or diagnosis of SARS-CoV-2 by FDA under an Emergency Use Authorization (EUA). This EUA will remain  in effect (meaning this test can be used) for the duration of the COVID-19 declaration under Section 564(b)(1) of the Act, 21 U.S.C.section 360bbb-3(b)(1), unless the authorization is terminated  or revoked sooner.       Influenza A by PCR NEGATIVE NEGATIVE Final   Influenza B by PCR NEGATIVE NEGATIVE Final    Comment: (NOTE) The Xpert Xpress SARS-CoV-2/FLU/RSV plus assay is intended as an aid in the diagnosis of influenza from Nasopharyngeal swab specimens and should not be used as a sole basis for treatment. Nasal washings and aspirates are unacceptable for Xpert Xpress SARS-CoV-2/FLU/RSV testing.  Fact Sheet for Patients: BloggerCourse.com  Fact Sheet for Healthcare  Providers: SeriousBroker.it  This test is not yet approved or cleared by the [place] FDA and has been authorized for detection and/or diagnosis of SARS-CoV-2 by FDA under an Emergency Use Authorization (EUA). This EUA will remain in effect (meaning this test can be used) for the duration of the COVID-19 declaration under Section 564(b)(1) of the Act, 21 U.S.C. section 360bbb-3(b)(1), unless the authorization is terminated or revoked.  Performed at [facility], [address], [place], [place] [address]      Radiology Studies: No results found.  Scheduled Meds:  alum & mag hydroxide-simeth  10 mL  Oral TID AC   diclofenac Sodium  2 g Topical QID   enoxaparin (LOVENOX) injection  40 mg Subcutaneous Daily   gabapentin  400 mg Oral TID   influenza vac split quadrivalent PF  0.5 mL Intramuscular Tomorrow-1000   insulin aspart  0-15 Units Subcutaneous TID WC   insulin aspart  0-5 Units Subcutaneous QHS   insulin aspart  6 Units Subcutaneous TID WC   insulin detemir  15 Units Subcutaneous BID   lidocaine  1 patch Transdermal Q24H   lisdexamfetamine  40 mg Oral Daily   metoCLOPramide (REGLAN) injection  10 mg Intravenous Q8H   pantoprazole  40 mg Oral BID   scopolamine  1 patch Transdermal Q72H   sucralfate  1 g Oral TID WC & HS   Continuous Infusions:   LOS: 6 days    Joycelyn Das, MD [facility] Available via Epic secure chat 7am-7pm After these hours, please refer to coverage provider listed on [email or web address] 06/04/2021, 1:54 PM

## 2021-06-04 NOTE — Progress Notes (Addendum)
Inpatient Diabetes Program Recommendations  AACE/ADA: New Consensus Statement on Inpatient Glycemic Control   Target Ranges:  Prepandial:   less than 140 mg/dL      Peak postprandial:   less than 180 mg/dL (1-2 hours)      Critically ill patients:  140 - 180 mg/dL   Results for Julie [name], Julie [name] (MRN [id number]) as of 06/04/2021 07:50  Ref. Range 06/03/2021 08:09 06/03/2021 12:57 06/03/2021 16:13 06/03/2021 20:03 06/04/2021 07:52  Glucose-Capillary Latest Ref Range: 70 - 99 mg/dL 268 (H) 341 (H) 962 (H) 268 (H) 364 (H)    Review of Glycemic Control  Diabetes history: DM 1 Outpatient Diabetes medications: Levemir 35 units daily, Novolog 6 units tid with meals Current orders for Inpatient glycemic control: Levemir 13 units BID, Novolog 0-9 units TID with meals, Novolog 0-5 units QHS, Novolog 5 units TID with meals   Inpatient Diabetes Program Recommendations:     Insulin: Please consider increasing Levemir to 15 units BID and meal coverage to Novolog 6 units TID with meals.  Thanks, Orlando Penner, RN, MSN, CDE Diabetes Coordinator Inpatient Diabetes Program [phone] (Team Pager from 8am to 5pm)

## 2021-06-05 LAB — CREATININE, SERUM
Creatinine, Ser: 0.59 mg/dL (ref 0.44–1.00)
GFR, Estimated: >60 mL/min (ref >60)

## 2021-06-05 LAB — GLUCOSE, CAPILLARY: Glucose-Capillary: 280 mg/dL — ABNORMAL HIGH (ref 70–99)

## 2021-06-05 MED ORDER — DICLOFENAC SODIUM 1 % EX GEL
2.0000 g | Freq: Four times a day (QID) | CUTANEOUS | 2 refills | Status: DC
Start: 2021-06-05 — End: 2024-02-07

## 2021-06-05 MED ORDER — SUCRALFATE 1 G PO TABS: 1.0000 g | ORAL_TABLET | Freq: Three times a day (TID) | ORAL | 0 refills | Status: DC

## 2021-06-05 MED ORDER — PANTOPRAZOLE SODIUM 40 MG PO TBEC: 40.0000 mg | DELAYED_RELEASE_TABLET | Freq: Two times a day (BID) | ORAL | 2 refills | Status: DC

## 2021-06-05 MED ORDER — ONDANSETRON HCL 4 MG PO TABS: 4.0000 mg | ORAL_TABLET | Freq: Every day | ORAL | 1 refills | Status: AC | PRN

## 2021-06-05 MED ORDER — ALUM & MAG HYDROXIDE-SIMETH 200-200-20 MG/5ML PO SUSP: 10.0000 mL | Freq: Three times a day (TID) | ORAL | 0 refills | Status: AC

## 2021-06-05 MED ORDER — PROMETHAZINE HCL 25 MG PO TABS: 25.0000 mg | ORAL_TABLET | Freq: Four times a day (QID) | ORAL | 0 refills | Status: DC | PRN

## 2021-06-05 MED ORDER — LIDOCAINE 5 % EX PTCH: 1.0000 | MEDICATED_PATCH | CUTANEOUS | 0 refills | Status: DC

## 2021-06-05 NOTE — Progress Notes (Signed)
OT Cancellation Note  Patient Details Name: Julie [name] MRN: [id number] DOB: Feb 26, 1991   Cancelled Treatment:    Reason Eval/Treat Not Completed: Other (comment): Pt going over d/c paperwork with RN. Pt pleasantly confirmed preference to save energy for discharge rather than work with OT.   Theodoro Clock 06/05/2021, 11:02 AM

## 2021-06-05 NOTE — Progress Notes (Signed)
Nursing Discharge Note   Admit Date: 05/28/2021  Discharge date: 06/05/2021   Julie [name] is to be discharged home per MD order.  AVS completed. Reviewed with by Jichelle Gray RN with patient at bedside. Highlighted copy provided for patient to take home.   Discharge Instructions   None    Allergies as of 06/05/2021   No Known Allergies      Medication List     STOP taking these medications    famotidine 20 MG tablet Commonly known as: PEPCID   promethazine-dextromethorphan 6.25-15 MG/5ML syrup Commonly known as: PROMETHAZINE-DM       TAKE these medications    alum & mag hydroxide-simeth 200-200-20 MG/5ML suspension Commonly known as: MAALOX/MYLANTA Take 10 mLs by mouth 3 (three) times daily before meals.   busPIRone 15 MG tablet Commonly known as: BUSPAR Take 15 mg by mouth 3 (three) times daily as needed for anxiety.   diclofenac Sodium 1 % Gel Commonly known as: VOLTAREN Apply 2 g topically 4 (four) times daily.   gabapentin 800 MG tablet Commonly known as: NEURONTIN Take 800 mg by mouth in the morning, at noon, in the evening, and at bedtime.   hydrOXYzine 25 MG tablet Commonly known as: ATARAX/VISTARIL Take 12.5-25 mg by mouth every 8 (eight) hours as needed for anxiety.   insulin aspart 100 UNIT/ML FlexPen Commonly known as: NovoLOG FlexPen Inject 12 Units into the skin 3 (three) times daily with meals. What changed: how much to take   insulin detemir 100 UNIT/ML injection Commonly known as: LEVEMIR Inject 35 Units into the skin daily.   Insulin Pen Needle 30G X 8 MM Misc Commonly known as: NOVOFINE Inject 10 each into the skin as needed.   lidocaine 5 % Commonly known as: LIDODERM Place 1 patch onto the skin daily. Remove & Discard patch within 12 hours or as directed by MD   naloxone 4 MG/0.1ML Liqd nasal spray kit Commonly known as: NARCAN Place 1 spray into the nose as needed.   ondansetron 4 MG tablet Commonly known as: Zofran Take 1  tablet (4 mg total) by mouth daily as needed for nausea or vomiting.   oxyCODONE 5 MG immediate release tablet Commonly known as: Oxy IR/ROXICODONE Take 10 mg by mouth every 8 (eight) hours as needed for pain.   pantoprazole 40 MG tablet Commonly known as: PROTONIX Take 1 tablet (40 mg total) by mouth 2 (two) times daily.   promethazine 25 MG tablet Commonly known as: PHENERGAN Take 1 tablet (25 mg total) by mouth every 6 (six) hours as needed for refractory nausea / vomiting (if not helped with Zofran). What changed:  when to take this reasons to take this   sucralfate 1 g tablet Commonly known as: Carafate Take 1 tablet (1 g total) by mouth 4 (four) times daily -  with meals and at bedtime.   traMADol 50 MG tablet Commonly known as: ULTRAM Take 50 mg by mouth in the morning, at noon, and at bedtime.   Vyvanse 40 MG capsule Generic drug: lisdexamfetamine Take 40 mg by mouth daily.   zolpidem 10 MG tablet Commonly known as: AMBIEN Take 10 mg by mouth at bedtime as needed for sleep.         

## 2021-06-05 NOTE — Discharge Summary (Signed)
Physician Discharge Summary  Julie [name] OYD:[id number] DOB: [birth year] DOA: 05/28/2021  PCP: [facility] date: 05/28/2021 Discharge date: 06/05/2021  Admitted From: Home  Discharge disposition: Home PT  Recommendations for Outpatient Follow-Up:   Follow up with your primary care provider in one week.  Patient should have better control of diabetes. Follow-up with GI in 1 month.  Patient should be considered for gastric emptying study as outpatient. Check CBC, BMP, magnesium in the next visit  Discharge Diagnosis:   Principal Problem:   DKA, type 1 ([place]) Active Problems:   Depression with anxiety   Chest pain   Abdominal pain, epigastric   Dysphagia  Discharge Condition: Improved.  Diet recommendation: Carbohydrate-modified.  Soft small portion low residue low-fat diet  Wound care: None.  Code status: Full code.  History of Present Illness:   Julie [name] is a 30 year old female with past medical history significant for type 1 diabetes mellitus, depression/anxiety, chronic low back pain s/p lumbar laminectomy, insomnia  presented to the hospital on 06/07/2021 with concerns for elevated blood glucose levels, chest pain, nausea, vomiting.  Patient reported taking  15 units of Levemir every night and 6 units of NovoLog 3 times daily but her glucometer continues to read high.  She also complained of chest pain and difficulty swallowing.  She had episodes of nausea and vomiting and was unable to tolerate oral diet.  In the ED patient was noted to be tachycardic.    CTA chest negative for PE, no acute cardiopulmonary disease process.  Glucose was elevated at 619, bicarbonate 15, anion gap 17.  High sensitive troponin negative x2.  Lipase was within normal limits.  Urinalysis with glucosuria and ketonuria.  Patient was given 1.5 L of LR, IV potassium, 1 mg IV Dilaudid, and started on IV insulin infusion.  Patient was admitted to the hospital for  further evaluation and treatment.  Hospital Course:   Following conditions were addressed during hospitalization as listed below,  Diabetic ketoacidosis with history of Type 1 diabetes mellitus Patient presented with episodes of nausea vomiting and epigastric pain.   Hemoglobin A1c 11.7, poorly controlled.  Will need better control of diabetes as outpatient.   Dysphagia/odynophagia/Suspected gastroparesis Thought to be secondary to gastroparesis.  Patient underwent EGD with findings unrevealing, negative for H. pylori. On Vicodin at home.  Counseled on avoiding narcotics if possible.  Continue Protonix  twice daily, Carafate, on discharge.  GI recommends outpatient follow-up with GI and  gastric emptying study as outpatient in 1 to 2 months   Atypical chest pain On presentation.  Noncardiac chest pain.  EKG with no dynamic changes.  High sensitive troponin negative.   No further work-up was planned..   Depression/anxiety/Insomnia On atarax, ambien as outpatient.   Chronic back pain with radiculopathy Patient with history of lumbar laminectomy.  Lumbar x-ray with no acute osseous abnormality, mild to space narrowing L5-S1.  Left hip xray without acute findings.  Continue oxycodone, Lidoderm patch, gabapentin, Voltaren gel.  Physical therapy has recommended home PT on discharge   Mild hyperkalemia.  Follow up as outpatient.   Hypophosphatemia Replenished.   Hypomagnesemia Replenished.   Hyponatremia Mild.  Sodium of 133.   Tobacco use disorder Advised quitting smoking   Malingering/drug-seeking behavior  Seen by psychiatry and high suspicion for secondary gain.  Will need to limit her narcotics and sedatives  Back pain, lumbar laminectomy, ambulatory dysfunction.  Seen by physical therapy recommended home PT on discharge.   Disposition.  At this time, patient is stable for disposition home with home PT  Medical Consultants:   Gastroenterology  - signed off  9/14 Psychiatry  Procedures:    EGD 05/30/21 Subjective:   Today, patient was seen and examined at bedside.  Feels little nauseated.  Otherwise okay for home  Discharge Exam:   Vitals:   06/04/21 2126 06/05/21 0540  BP: 111/73 104/87  Pulse: 100 90  Resp:    Temp: 98.7 F (37.1 C)   SpO2: 98% 100%   Vitals:   06/04/21 0549 06/04/21 1600 06/04/21 2126 06/05/21 0540  BP: (!) 91/59 111/71 111/73 104/87  Pulse: 84  100 90  Resp: 20 18    Temp: (!) 97.4 F (36.3 C) 100.3 F (37.9 C) 98.7 F (37.1 C)   TempSrc: Oral Oral Oral   SpO2: 99% 98% 98% 100%  Weight:      Height:        General: Alert awake, not in obvious distress HENT: pupils equally reacting to light,  No scleral pallor or icterus noted. Oral mucosa is moist.  Chest:  Clear breath sounds.  Diminished breath sounds bilaterally. No crackles or wheezes.  CVS: S1 &S2 heard. No murmur.  Regular rate and rhythm. Abdomen: Soft, nontender, nondistended.  Bowel sounds are heard.   Extremities: No cyanosis, clubbing or edema.  Peripheral pulses are palpable. Psych: Alert, awake and oriented, normal mood CNS:  No cranial nerve deficits.  Power equal in all extremities.   Skin: Warm and dry.  No rashes noted.  The results of significant diagnostics from this hospitalization (including imaging, microbiology, ancillary and laboratory) are listed below for reference.     Diagnostic Studies:   CT Angio Chest PE W and/or Wo Contrast  Result Date: 05/28/2021 CLINICAL DATA:  Chest pain hyperglycemia EXAM: CT ANGIOGRAPHY CHEST WITH CONTRAST TECHNIQUE: Multidetector CT imaging of the chest was performed using the standard protocol during bolus administration of intravenous contrast. Multiplanar CT image reconstructions and MIPs were obtained to evaluate the vascular anatomy. CONTRAST:  194m OMNIPAQUE IOHEXOL 350 MG/ML SOLN COMPARISON:  Chest x-ray 05/18/2021 FINDINGS: Cardiovascular: Satisfactory opacification of the pulmonary  arteries to the segmental level. No evidence of pulmonary embolism. Normal heart size. No pericardial effusion. Nonaneurysmal aorta. No dissection is seen. Mediastinum/Nodes: No enlarged mediastinal, hilar, or axillary lymph nodes. Thyroid gland, trachea, and esophagus demonstrate no significant findings. Lungs/Pleura: Lungs are clear. No pleural effusion or pneumothorax. Upper Abdomen: No acute abnormality. Musculoskeletal: No chest wall abnormality. No acute or significant osseous findings. Review of the MIP images confirms the above findings. IMPRESSION: Negative. No CT evidence for acute pulmonary embolus or aortic dissection. Clear lung fields Electronically Signed   By: KDonavan FoilM.D.   On: 05/28/2021 21:20     Labs:   Basic Metabolic Panel: Recent Labs  Lab 05/30/21 0133 05/31/21 0354 05/31/21 0403 06/01/21 0217 06/02/21 0657909/14/22 0223 06/04/21 0249 06/05/21 0445  NA 134* 136  --  138 137 131* 133*  --   K 5.2* 4.1  --  4.6 4.3 5.5* 5.3*  --   CL 104 108  --  104 101 98 98  --   CO2 23 23  --  _0 --   GLUCOSE 345* 278*  --  139* 265* 446* 376*  --   BUN 5* 6  --  6 <5* 8 9  --   CREATININE 0.50 0.51  --  0.62 0.53 0.62 0.63 0.59  CALCIUM  8.6* 8.4*  --  8.5* 8.8* 8.8* 8.9  --   MG 1.9  --  1.6* 2.0 1.6* 1.8 1.8  --   PHOS 3.1  --  3.7 4.2 4.4 4.3  --   --    GFR Estimated Creatinine Clearance: 114.9 mL/min (by C-G formula based on SCr of 0.59 mg/dL). Liver Function Tests: Recent Labs  Lab 05/30/21 0133 06/01/21 0217 06/02/21 0608 06/03/21 0223  AST _0 ALT _1 ALKPHOS 72 59 66 67  BILITOT 0.9 0.7 0.5 0.3  PROT 5.7* 5.6* 5.8* 6.2*  ALBUMIN 2.7* 2.8* 2.7* 2.8*   No results for input(s): LIPASE, AMYLASE in the last 168 hours. No results for input(s): AMMONIA in the last 168 hours. Coagulation profile No results for input(s): INR, PROTIME in the last 168 hours.  CBC: Recent Labs  Lab 05/30/21 0133 05/31/21 0354 06/01/21 0217  06/02/21 0608 06/03/21 0223  WBC 5.8 7.7 6.7 6.1 7.4  NEUTROABS 2.1  --  2.9 2.9 3.5  HGB 13.5 12.1 12.9 13.0 14.1  HCT 40.1 36.4 38.7 39.5 41.8  MCV 86.1 86.5 86.8 86.8 85.8  PLT 188 171 188 196 216   Cardiac Enzymes: No results for input(s): CKTOTAL, CKMB, CKMBINDEX, TROPONINI in the last 168 hours. BNP: Invalid input(s): POCBNP CBG: Recent Labs  Lab 06/04/21 0752 06/04/21 1142 06/04/21 1707 06/04/21 2156 06/05/21 0801  GLUCAP 364* 418* 184* 356* 280*   D-Dimer No results for input(s): DDIMER in the last 72 hours. Hgb A1c No results for input(s): HGBA1C in the last 72 hours. Lipid Profile No results for input(s): CHOL, HDL, LDLCALC, TRIG, CHOLHDL, LDLDIRECT in the last 72 hours. Thyroid function studies No results for input(s): TSH, T4TOTAL, T3FREE, THYROIDAB in the last 72 hours.  Invalid input(s): FREET3 Anemia work up No results for input(s): VITAMINB12, FOLATE, FERRITIN, TIBC, IRON, RETICCTPCT in the last 72 hours. Microbiology Recent Results (from the past 240 hour(s))  Resp Panel by RT-PCR (Flu A&B, Covid) Nasopharyngeal Swab     Status: None   Collection Time: 05/28/21  7:37 PM   Specimen: Nasopharyngeal Swab; Nasopharyngeal(NP) swabs in vial transport medium  Result Value Ref Range Status   SARS Coronavirus 2 by RT PCR NEGATIVE NEGATIVE Final    Comment: (NOTE) SARS-CoV-2 target nucleic acids are NOT DETECTED.  The SARS-CoV-2 RNA is generally detectable in upper respiratory specimens during the acute phase of infection. The lowest concentration of SARS-CoV-2 viral copies this assay can detect is 138 copies/mL. A negative result does not preclude SARS-Cov-2 infection and should not be used as the sole basis for treatment or other patient management decisions. A negative result may occur with  improper specimen collection/handling, submission of specimen other than nasopharyngeal swab, presence of viral mutation(s) within the areas targeted by this assay,  and inadequate number of viral copies(<138 copies/mL). A negative result must be combined with clinical observations, patient history, and epidemiological information. The expected result is Negative.  Fact Sheet for Patients:  EntrepreneurPulse.com.au  Fact Sheet for Healthcare Providers:  IncredibleEmployment.be  This test is no t yet approved or cleared by the [place] FDA and  has been authorized for detection and/or diagnosis of SARS-CoV-2 by FDA under an Emergency Use Authorization (EUA). This EUA will remain  in effect (meaning this test can be used) for the duration of the COVID-19 declaration under Section 564(b)(1) of the Act, 21 U.S.C.section 360bbb-3(b)(1), unless the authorization is terminated  or revoked sooner.  Influenza A by PCR NEGATIVE NEGATIVE Final   Influenza B by PCR NEGATIVE NEGATIVE Final    Comment: (NOTE) The Xpert Xpress SARS-CoV-2/FLU/RSV plus assay is intended as an aid in the diagnosis of influenza from Nasopharyngeal swab specimens and should not be used as a sole basis for treatment. Nasal washings and aspirates are unacceptable for Xpert Xpress SARS-CoV-2/FLU/RSV testing.  Fact Sheet for Patients: EntrepreneurPulse.com.au  Fact Sheet for Healthcare Providers: IncredibleEmployment.be  This test is not yet approved or cleared by the [place] FDA and has been authorized for detection and/or diagnosis of SARS-CoV-2 by FDA under an Emergency Use Authorization (EUA). This EUA will remain in effect (meaning this test can be used) for the duration of the COVID-19 declaration under Section 564(b)(1) of the Act, 21 U.S.C. section 360bbb-3(b)(1), unless the authorization is terminated or revoked.  Performed at [facility], [address] [place], [place] [address]      Discharge Instructions:   Discharge Instructions     Diet Carb Modified    Complete by: As directed    Soft diet, low residue diet, small frequent meals   Discharge instructions   Complete by: As directed    Take the nausea medicine 20 mins prior to eating.  Follow-up with GI in 1 to 2 months.  Consider gastric emptying study at that time.  Follow-up with your primary care physician in 1 week.  Stay on low-fat, low residue diet.  Small portion frequent meals. Follow-up with your primary care physician regarding adequate control of diabetes   Increase activity slowly   Complete by: As directed       Allergies as of 06/05/2021   No Known Allergies      Medication List     STOP taking these medications    famotidine 20 MG tablet Commonly known as: PEPCID   promethazine-dextromethorphan 6.25-15 MG/5ML syrup Commonly known as: PROMETHAZINE-DM       TAKE these medications    alum & mag hydroxide-simeth 200-200-20 MG/5ML suspension Commonly known as: MAALOX/MYLANTA Take 10 mLs by mouth 3 (three) times daily before meals.   busPIRone 15 MG tablet Commonly known as: BUSPAR Take 15 mg by mouth 3 (three) times daily as needed for anxiety.   diclofenac Sodium 1 % Gel Commonly known as: VOLTAREN Apply 2 g topically 4 (four) times daily.   gabapentin 800 MG tablet Commonly known as: NEURONTIN Take 800 mg by mouth in the morning, at noon, in the evening, and at bedtime.   hydrOXYzine 25 MG tablet Commonly known as: ATARAX/VISTARIL Take 12.5-25 mg by mouth every 8 (eight) hours as needed for anxiety.   insulin aspart 100 UNIT/ML FlexPen Commonly known as: NovoLOG FlexPen Inject 12 Units into the skin 3 (three) times daily with meals. What changed: how much to take   insulin detemir 100 UNIT/ML injection Commonly known as: LEVEMIR Inject 35 Units into the skin daily.   Insulin Pen Needle 30G X 8 MM Misc Commonly known as: NOVOFINE Inject 10 each into the skin as needed.   lidocaine 5 % Commonly known as: LIDODERM Place 1 patch onto the skin  daily. Remove & Discard patch within 12 hours or as directed by MD   naloxone 4 MG/0.1ML Liqd nasal spray kit Commonly known as: NARCAN Place 1 spray into the nose as needed.   ondansetron 4 MG tablet Commonly known as: Zofran Take 1 tablet (4 mg total) by mouth daily as needed for nausea or vomiting.  oxyCODONE 5 MG immediate release tablet Commonly known as: Oxy IR/ROXICODONE Take 10 mg by mouth every 8 (eight) hours as needed for pain.   pantoprazole 40 MG tablet Commonly known as: PROTONIX Take 1 tablet (40 mg total) by mouth 2 (two) times daily.   promethazine 25 MG tablet Commonly known as: PHENERGAN Take 1 tablet (25 mg total) by mouth every 6 (six) hours as needed for refractory nausea / vomiting (if not helped with Zofran). What changed:  when to take this reasons to take this   sucralfate 1 g tablet Commonly known as: Carafate Take 1 tablet (1 g total) by mouth 4 (four) times daily -  with meals and at bedtime.   traMADol 50 MG tablet Commonly known as: ULTRAM Take 50 mg by mouth in the morning, at noon, and at bedtime.   Vyvanse 40 MG capsule Generic drug: lisdexamfetamine Take 40 mg by mouth daily.   zolpidem 10 MG tablet Commonly known as: AMBIEN Take 10 mg by mouth at bedtime as needed for sleep.        Follow-up Information     [facility] Follow up.   Why: [place] will contact you to schedule your first home visit. Contact information: P: [facility]. Schedule an appointment as soon as possible for a visit in 4 week(s).   Specialty: Gastroenterology Why: for GI followup/gastric emptying study Contact information: [place] [phone]        [facility]. Schedule an appointment as soon as possible for a visit in 1 week(s).   Why: for regular followup, blood work Sport and exercise psychologist information: [place] [address] [phone]                 Time coordinating discharge: 39 minutes  Signed:  Haakon Titsworth  [facility] 06/05/2021, 10:46 AM

## 2021-06-05 NOTE — TOC Transition Note (Addendum)
Transition of Care [facility]) - CM/SW Discharge Note   Patient Details  Name: Julie [name] MRN: [id number] Date of Birth: 10-08-1990  [facility] CM/SW Contact:  Lorri Frederick, LCSW Phone Number: 06/05/2021, 10:29 AM   Clinical Narrative:   Pt discharging home with [facility].  Discussed with pt working with her endocrinologist regarding replacement for insulin pump and pt verbalizes understanding.  Pt reports her friend will be transporting her home.  No other needs identified.    1115: CSW contacted by RN that pt needing transportation home. [facility] transportation contacted, ride arranged, pt asking [place] [address] [facility] [place]  CSW checked with [facility] who cannot [place] Pt informed and agrees to return to home address in [facility] for [facility] services.     Final next level of care: Home w Home Health Services Barriers to Discharge: Barriers Resolved   Patient Goals and CMS Choice Patient states their goals for this hospitalization and ongoing recovery are:: "zero pain" CMS Medicare.gov Compare Post Acute Care list provided to:: Patient Choice offered to / list presented to : Patient  Discharge Placement                       Discharge Plan and Services In-house Referral: Clinical Social Work   Post Acute Care Choice: Home Health                    HH Arranged: PT, OT [facility] Agency: [facility] Date HH Agency Contacted: 06/03/21 Time HH Agency Contacted: 1054 Representative spoke with at [facility] Agency: Andrez Grime  Social Determinants of Health (SDOH) Interventions     Readmission Risk Interventions No flowsheet data found.

## 2021-06-05 NOTE — Plan of Care (Signed)

## 2021-11-08 ENCOUNTER — Emergency Department (HOSPITAL_BASED_OUTPATIENT_CLINIC_OR_DEPARTMENT_OTHER)
Admission: EM | Admit: 2021-11-08 | Discharge: 2021-11-09 | Disposition: A | Discharge status: Home / Self Care | Payer: Medicaid Other | Attending: Emergency Medicine | Admitting: Emergency Medicine

## 2021-11-08 ENCOUNTER — Encounter (HOSPITAL_BASED_OUTPATIENT_CLINIC_OR_DEPARTMENT_OTHER): Payer: Self-pay | Admitting: Emergency Medicine

## 2021-11-08 ENCOUNTER — Other Ambulatory Visit: Payer: Self-pay

## 2021-11-08 DIAGNOSIS — Z794 Long term (current) use of insulin: Secondary | ICD-10-CM | POA: Insufficient documentation

## 2021-11-08 DIAGNOSIS — E109 Type 1 diabetes mellitus without complications: Secondary | ICD-10-CM | POA: Insufficient documentation

## 2021-11-08 DIAGNOSIS — R109 Unspecified abdominal pain: Secondary | ICD-10-CM | POA: Insufficient documentation

## 2021-11-08 DIAGNOSIS — Z20822 Contact with and (suspected) exposure to covid-19: Secondary | ICD-10-CM | POA: Insufficient documentation

## 2021-11-08 DIAGNOSIS — R000 Tachycardia, unspecified: Secondary | ICD-10-CM | POA: Insufficient documentation

## 2021-11-08 LAB — I-STAT VENOUS BLOOD GAS, ED
Acid-Base Excess: 0 mmol/L (ref 0.0–2.0)
Bicarbonate: 24.9 mmol/L (ref 20.0–28.0)
Calcium, Ion: 1.25 mmol/L (ref 1.15–1.40)
HCT: 38 % (ref 36.0–46.0)
Hemoglobin: 12.9 g/dL (ref 12.0–15.0)
O2 Saturation: 57 %
Patient temperature: 98.2
Potassium: 4.2 mmol/L (ref 3.5–5.1)
Sodium: 136 mmol/L (ref 135–145)
TCO2: 26 mmol/L (ref 22–32)
pCO2, Ven: 39.7 mmHg — ABNORMAL LOW (ref 44–60)
pH, Ven: 7.404 (ref 7.25–7.43)
pO2, Ven: 29 mmHg — CL (ref 32–45)

## 2021-11-08 LAB — URINALYSIS, ROUTINE W REFLEX MICROSCOPIC
Bilirubin Urine: NEGATIVE
Glucose, UA: >=500 mg/dL — AB
Ketones, ur: NEGATIVE mg/dL
Leukocytes,Ua: NEGATIVE
Nitrite: POSITIVE — AB
Protein, ur: 30 mg/dL — AB
Specific Gravity, Urine: <=1.005 (ref 1.005–1.030)
pH: 5.5 (ref 5.0–8.0)

## 2021-11-08 LAB — BASIC METABOLIC PANEL
Anion gap: 8 (ref 5–15)
BUN: 7 mg/dL (ref 6–20)
CO2: 22 mmol/L (ref 22–32)
Calcium: 8.9 mg/dL (ref 8.9–10.3)
Chloride: 104 mmol/L (ref 98–111)
Creatinine, Ser: 0.48 mg/dL (ref 0.44–1.00)
GFR, Estimated: >60 mL/min (ref >60)
Glucose, Bld: 283 mg/dL — ABNORMAL HIGH (ref 70–99)
Potassium: 4 mmol/L (ref 3.5–5.1)
Sodium: 134 mmol/L — ABNORMAL LOW (ref 135–145)

## 2021-11-08 LAB — CBC WITH DIFFERENTIAL/PLATELET
Abs Immature Granulocytes: 0.03 10*3/uL (ref 0.00–0.07)
Basophils Absolute: 0 10*3/uL (ref 0.0–0.1)
Basophils Relative: 1 %
Eosinophils Absolute: 0.2 10*3/uL (ref 0.0–0.5)
Eosinophils Relative: 2 %
HCT: 36.9 % (ref 36.0–46.0)
Hemoglobin: 12.6 g/dL (ref 12.0–15.0)
Immature Granulocytes: 0 %
Lymphocytes Relative: 28 %
Lymphs Abs: 2.5 10*3/uL (ref 0.7–4.0)
MCH: 28.8 pg (ref 26.0–34.0)
MCHC: 34.1 g/dL (ref 30.0–36.0)
MCV: 84.2 fL (ref 80.0–100.0)
Monocytes Absolute: 0.6 10*3/uL (ref 0.1–1.0)
Monocytes Relative: 7 %
Neutro Abs: 5.4 10*3/uL (ref 1.7–7.7)
Neutrophils Relative %: 62 %
Platelets: 231 10*3/uL (ref 150–400)
RBC: 4.38 MIL/uL (ref 3.87–5.11)
RDW: 12.5 % (ref 11.5–15.5)
WBC: 8.7 10*3/uL (ref 4.0–10.5)
nRBC: 0 % (ref 0.0–0.2)

## 2021-11-08 LAB — RESP PANEL BY RT-PCR (FLU A&B, COVID) ARPGX2
Influenza A by PCR: NEGATIVE
Influenza B by PCR: NEGATIVE
SARS Coronavirus 2 by RT PCR: NEGATIVE

## 2021-11-08 LAB — PREGNANCY, URINE: Preg Test, Ur: NEGATIVE

## 2021-11-08 LAB — CBG MONITORING, ED: Glucose-Capillary: 309 mg/dL — ABNORMAL HIGH (ref 70–99)

## 2021-11-08 LAB — URINALYSIS, MICROSCOPIC (REFLEX)

## 2021-11-08 MED ORDER — ONDANSETRON HCL 4 MG/2ML IJ SOLN: 4.0000 mg | Freq: Once | INTRAMUSCULAR | Status: DC

## 2021-11-08 MED ORDER — MORPHINE SULFATE (PF) 4 MG/ML IV SOLN
4.0000 mg | Freq: Once | INTRAVENOUS | Status: AC
  Administered 2021-11-08: 4 mg via INTRAVENOUS
  Filled 2021-11-08: qty 1

## 2021-11-08 MED ORDER — SODIUM CHLORIDE 0.9 % IV SOLN
25.0000 mg | Freq: Four times a day (QID) | INTRAVENOUS | Status: DC | PRN
  Administered 2021-11-08: 25 mg via INTRAVENOUS
  Filled 2021-11-08: qty 1

## 2021-11-08 MED ORDER — PROMETHAZINE HCL 25 MG/ML IJ SOLN
INTRAMUSCULAR | Status: AC
  Filled 2021-11-08: qty 1

## 2021-11-08 MED ORDER — SODIUM CHLORIDE 0.9 % IV BOLUS
1000.0000 mL | Freq: Once | INTRAVENOUS | Status: AC
  Administered 2021-11-08: 1000 mL via INTRAVENOUS

## 2021-11-08 MED ORDER — HYDROMORPHONE HCL 1 MG/ML IJ SOLN
1.0000 mg | Freq: Once | INTRAMUSCULAR | Status: AC
  Administered 2021-11-08: 1 mg via INTRAVENOUS
  Filled 2021-11-08: qty 1

## 2021-11-08 NOTE — ED Provider Notes (Signed)
[facility] EMERGENCY DEPARTMENT Provider Note   CSN: [id number] Arrival date & time: 11/08/21  1815     History  Chief Complaint  Patient presents with   Flank Pain    Julie [name] is a 31 y.o. female.   Flank Pain   Patient with past medical history notable for type 1 diabetes, bulimia nervosa, presents due to right flank pain.  Started on Wednesday last week, she was hospitalized at [facility] for DKA and right nephrolithiasis.  She was discharged this morning, states she was unable to tolerate oral intake despite outpatient nausea medicine.  Additionally the oxycodone she takes for pain did not improve the pain to return back to the ED.  No dysuria or hematuria, denies any chest pain shortness of breath.  Home Medications Prior to Admission medications   Medication Sig Start Date End Date Taking? Authorizing Provider  busPIRone (BUSPAR) 15 MG tablet Take 15 mg by mouth 3 (three) times daily as needed for anxiety. 05/11/21   [provider]  diclofenac Sodium (VOLTAREN) 1 % GEL Apply 2 g topically 4 (four) times daily. 06/05/21   Pokhrel, Corrie Mckusick, MD  gabapentin (NEURONTIN) 800 MG tablet Take 800 mg by mouth in the morning, at noon, in the evening, and at bedtime. 05/22/19   [provider]  hydrOXYzine (ATARAX/VISTARIL) 25 MG tablet Take 12.5-25 mg by mouth every 8 (eight) hours as needed for anxiety. 01/10/21   [provider]  insulin aspart (NOVOLOG FLEXPEN) 100 UNIT/ML FlexPen Inject 12 Units into the skin 3 (three) times daily with meals. Patient taking differently: Inject 6 Units into the skin 3 (three) times daily with meals. 04/10/17   Davonna Belling, MD  insulin detemir (LEVEMIR) 100 UNIT/ML injection Inject 35 Units into the skin daily.    [provider]  Insulin Pen Needle (NOVOFINE) 30G X 8 MM MISC Inject 10 each into the skin as needed. 07/27/13   Samella Parr, NP  lidocaine (LIDODERM) 5 % Place 1 patch  onto the skin daily. Remove & Discard patch within 12 hours or as directed by MD 06/05/21   Flora Lipps, MD  naloxone [facility]) nasal spray 4 mg/0.1 mL Place 1 spray into the nose as needed. 03/02/21   [provider]  ondansetron (ZOFRAN) 4 MG tablet Take 1 tablet (4 mg total) by mouth daily as needed for nausea or vomiting. 06/05/21 06/05/22  Pokhrel, Corrie Mckusick, MD  oxyCODONE (OXY IR/ROXICODONE) 5 MG immediate release tablet Take 10 mg by mouth every 8 (eight) hours as needed for pain. 05/23/21   [provider]  pantoprazole (PROTONIX) 40 MG tablet Take 1 tablet (40 mg total) by mouth 2 (two) times daily. 06/05/21   Pokhrel, Corrie Mckusick, MD  promethazine (PHENERGAN) 25 MG tablet Take 1 tablet (25 mg total) by mouth every 6 (six) hours as needed for refractory nausea / vomiting (if not helped with Zofran). 06/05/21   Pokhrel, Corrie Mckusick, MD  sucralfate (CARAFATE) 1 g tablet Take 1 tablet (1 g total) by mouth 4 (four) times daily -  with meals and at bedtime. 06/05/21 07/05/21  Pokhrel, Corrie Mckusick, MD  traMADol (ULTRAM) 50 MG tablet Take 50 mg by mouth in the morning, at noon, and at bedtime. 05/13/19   [provider]  VYVANSE 40 MG capsule Take 40 mg by mouth daily. 05/24/21   [provider]  zolpidem (AMBIEN) 10 MG tablet Take 10 mg by mouth at bedtime as needed for sleep. 05/24/21   [provider]      Allergies    Toradol [ketorolac tromethamine]    Review of Systems   Review of Systems  Genitourinary:  Positive for flank pain.   Physical Exam Updated Vital Signs BP 132/82    Pulse 87    Temp 98.4 F (36.9 C) (Oral)    Resp 12    Ht 5\' 11"  (1.803 m)    Wt 77.1 kg    LMP 10/25/2021    SpO2 100%    BMI 23.71 kg/m  Physical Exam Vitals and nursing note reviewed. Exam conducted with a chaperone present.  Constitutional:      Appearance: Normal appearance.  HENT:     Head: Normocephalic and atraumatic.  Eyes:     General: No scleral icterus.       Right eye: No  discharge.        Left eye: No discharge.     Extraocular Movements: Extraocular movements intact.     Pupils: Pupils are equal, round, and reactive to light.  Cardiovascular:     Rate and Rhythm: Regular rhythm. Tachycardia present.     Pulses: Normal pulses.     Heart sounds: Normal heart sounds. No murmur heard.   No friction rub. No gallop.  Pulmonary:     Effort: Pulmonary effort is normal. No respiratory distress.     Breath sounds: Normal breath sounds.  Abdominal:     General: Abdomen is flat. Bowel sounds are normal. There is no distension.     Palpations: Abdomen is soft.     Tenderness: There is no abdominal tenderness.  Skin:    General: Skin is warm and dry.     Coloration: Skin is not jaundiced.  Neurological:     Mental Status: She is alert. Mental status is at baseline.     Coordination: Coordination normal.    ED Results / Procedures / Treatments   Labs (all labs ordered are listed, but only abnormal results are displayed) Labs Reviewed  URINALYSIS, ROUTINE W REFLEX MICROSCOPIC - Abnormal; Notable for the following components:      Result Value   Glucose, UA >=500 (*)    Hgb urine dipstick TRACE (*)    Protein, ur 30 (*)    Nitrite POSITIVE (*)    All other components within normal limits  URINALYSIS, MICROSCOPIC (REFLEX) - Abnormal; Notable for the following components:   Bacteria, UA FEW (*)    All other components within normal limits  BASIC METABOLIC PANEL - Abnormal; Notable for the following components:   Sodium 134 (*)    Glucose, Bld 283 (*)    All other components within normal limits  CBG MONITORING, ED - Abnormal; Notable for the following components:   Glucose-Capillary 309 (*)    All other components within normal limits  I-STAT VENOUS BLOOD GAS, ED - Abnormal; Notable for the following components:   pCO2, Ven 39.7 (*)    pO2, Ven 29 (*)    All other components within normal limits  RESP PANEL BY RT-PCR (FLU A&B, COVID) ARPGX2   PREGNANCY, URINE  CBC WITH DIFFERENTIAL/PLATELET  BETA-HYDROXYBUTYRIC ACID  I-STAT VENOUS BLOOD GAS, ED    EKG None  Radiology CT Renal Stone Study  Result Date: 11/09/2021 CLINICAL DATA:  Right flank pain. EXAM: CT ABDOMEN AND PELVIS WITHOUT CONTRAST TECHNIQUE: Multidetector CT imaging of the abdomen and pelvis was performed following the standard protocol without IV contrast. RADIATION DOSE REDUCTION: This exam was performed according to the departmental  dose-optimization program which includes automated exposure control, adjustment of the mA and/or kV according to patient size and/or use of iterative reconstruction technique. COMPARISON:  None. FINDINGS: Lower chest: No acute abnormality. Hepatobiliary: No focal liver abnormality is seen. No gallstones, gallbladder wall thickening, or biliary dilatation. Pancreas: Unremarkable. No pancreatic ductal dilatation or surrounding inflammatory changes. Spleen: Normal in size without focal abnormality. Adrenals/Urinary Tract: Adrenal glands are unremarkable. Kidneys are normal, without renal calculi, focal lesion, or hydronephrosis. The urinary bladder is moderately distended and otherwise unremarkable. Stomach/Bowel: Stomach is within normal limits. Appendix appears normal. No evidence of bowel wall thickening, distention, or inflammatory changes. Vascular/Lymphatic: No significant vascular findings are present. No enlarged abdominal or pelvic lymph nodes. Reproductive: An IUD is in place. Uterus and bilateral adnexa are otherwise unremarkable. Other: No abdominal wall hernia or abnormality. No abdominopelvic ascites. Musculoskeletal: No acute or significant osseous findings. IMPRESSION: 1. No acute or active process within the abdomen or pelvis. 2. IUD in place. Electronically Signed   By: Virgina Norfolk M.D.   On: 11/09/2021 00:14    Procedures Procedures    Medications Ordered in ED Medications  sodium chloride 0.9 % bolus 1,000 mL (0 mLs  Intravenous Stopped 11/08/21 2335)  morphine (PF) 4 MG/ML injection 4 mg (4 mg Intravenous Given 11/08/21 2310)  sodium chloride 0.9 % bolus 1,000 mL (1,000 mLs Intravenous New Bag/Given 11/08/21 2335)  HYDROmorphone (DILAUDID) injection 1 mg (1 mg Intravenous Given 11/08/21 2346)    ED Course/ Medical Decision Making/ A&P                           Medical Decision Making Amount and/or Complexity of Data Reviewed Labs: ordered. Radiology: ordered.  Risk Prescription drug management.   Patient presents with right flank pain.  Differential diagnosis includes but is not limited to nephrolithiasis, septic stone, sepsis, DKA, pyelonephritis, UTI  Patient care is complicated by comorbidity of insulin-dependent diabetes.  She has history of medical noncompliance based on endocrinologist note.    I viewed ED note from admission at [facility] yesterday.  She was admitted for DKA, given fluids and appeared to improve.  CT abdomen pelvis with IV contrast that showed hydronephrosis but no obvious obstruction.    Patient was initially tachycardic, not febrile or hypoxic.  I ordered the appropriate labs and imaging based my physical exam and her history.  Additionally, ordered 1L fluid bolus, analgesics and antiemetics.  I personally reviewed and independently interpreted the labs, EKG and CT renal.  CBC normal without leukocytosis.   BMP: There is no gross electrolyte derangement and although she is hyperglycemic at 283 she is not in DKA with normal potassium and normal bicarb.  VBG does not show signs of metabolic acidosis.   UA is nitrate positive but no leukocytes.  Was nitrate negative yesterday. There are signs of obvious dehydration glucosuria but not consistent with UTI.  Doubt pyelonephritis.  CT renal - negative for acute process.  ED EKG -sinus tachycardia, slight QT prolongation but no signs of hyperkalemia.  Will not give any other QT prolonging agents.  Reevaluation patient reports  improvement of her symptoms.  She has not had any episodes of emesis while in the ED.  Her vitals have improved, rhythm strip on the monitor shows sinus rhythm with a pulse rate of 87.    DX-flank pain, hyperglycemia  Rx -considered ordering narcotics, but patient currently has a pain management plan. Daily oxycodone and gabapentin.  Consider admission and ultimately feel patient is stable for discharge and outpatient follow-up.        Final Clinical Impression(s) / ED Diagnoses Final diagnoses:  Right flank pain    Rx / DC Orders ED Discharge Orders     None         Sherrill Raring, Hershal Coria 11/09/21 0052    Drenda Freeze, MD 11/09/21 [phone]

## 2021-11-08 NOTE — ED Triage Notes (Signed)
Pt c/o RT flank pain since Wednesday; was seen at [facility] for same on Fri

## 2021-11-08 NOTE — ED Notes (Signed)
Pt not sitting still when BP taken; rocking back and forth, complaining of pain

## 2021-11-09 ENCOUNTER — Emergency Department (HOSPITAL_BASED_OUTPATIENT_CLINIC_OR_DEPARTMENT_OTHER): Payer: Medicaid Other

## 2021-11-09 NOTE — ED Notes (Signed)
Patient taken to CT at this time.

## 2021-11-09 NOTE — Discharge Instructions (Signed)
Follow-up with your primary regarding pain if this continues.

## 2021-11-10 LAB — BETA-HYDROXYBUTYRIC ACID: Beta-Hydroxybutyric Acid: 1.03 mmol/L — ABNORMAL HIGH (ref 0.05–0.27)

## 2023-03-16 IMAGING — RF DG ESOPHAGUS
6 series · 12 of 12 positions shown · non-contrast
Comparison: None.

CLINICAL DATA: Recent history of esophagitis.

EXAM:
ESOPHOGRAM / BARIUM SWALLOW / BARIUM TABLET STUDY
TECHNIQUE: Single contrast examination performed using thin barium liquid. The
patient was observed with fluoroscopy swallowing a 13 mm barium
sulphate tablet.
FLUOROSCOPY TIME:  Fluoroscopy Time:  2 minutes 12 seconds
Radiation Exposure Index (if provided by the fluoroscopic device): 2
Number of Acquired Spot Images: 28.3 mGy

[Series 1: cp_standard · 0.37mm/px · 4 of 53 frames shown (1 of 4)]
[frame 8/53]
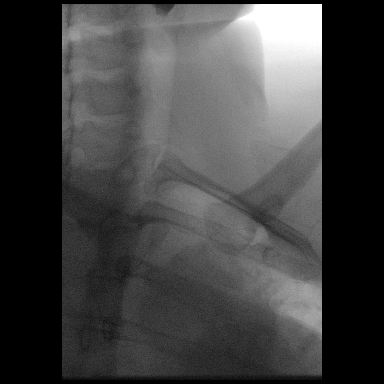
[frame 9/53]
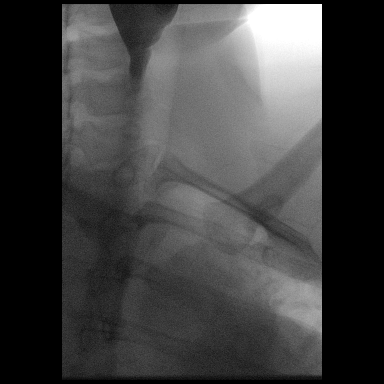
[frame 27/53]
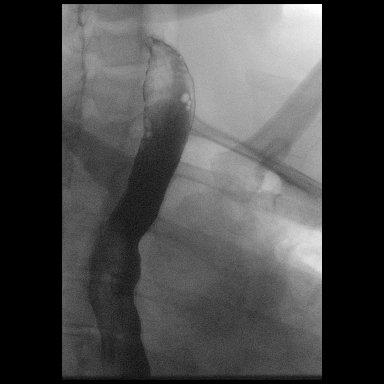
[frame 46/53]
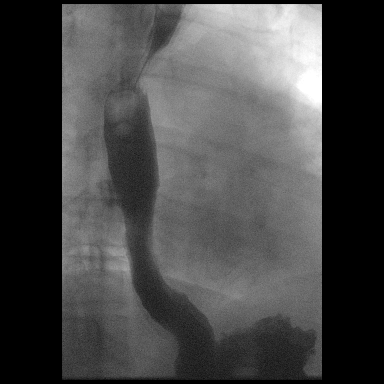

[Series 2: fluoro_barium 2fps_bw · 0.19mm/px · 1 of 1 slices shown (1 of 2)]
[im 1/1]
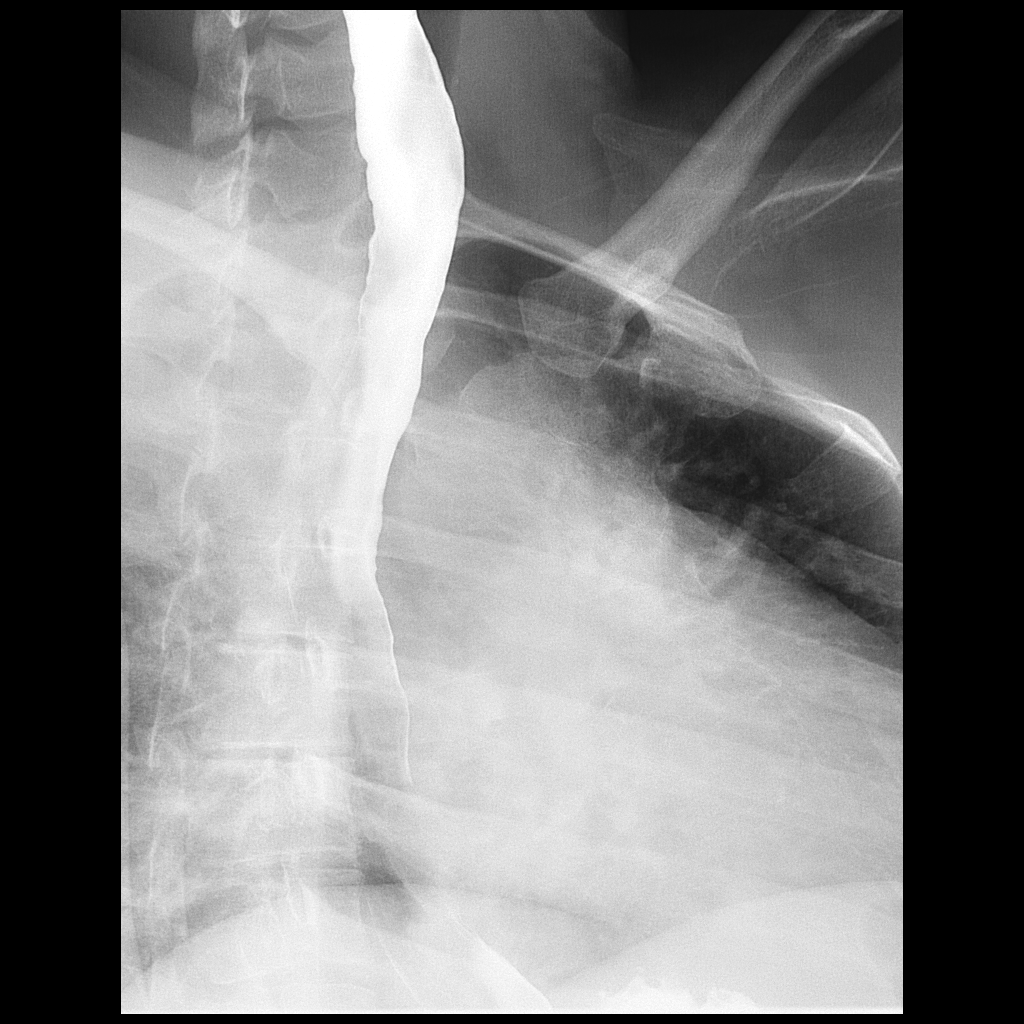

[Series 3: cp_standard · 0.19mm/px · 1 of 1 slices shown (2 of 4)]
[im 1/1]
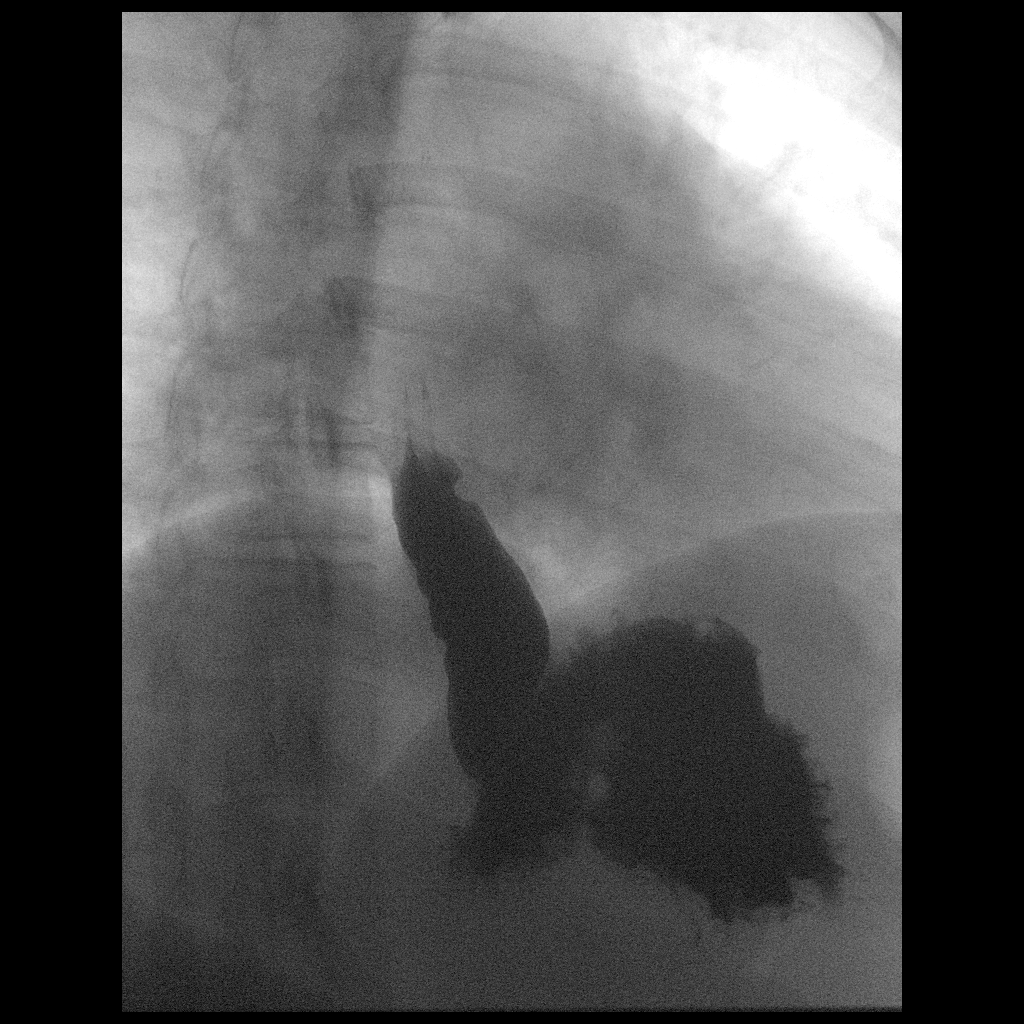

[Series 4: cp_standard · 0.26mm/px · 1 of 1 slices shown (3 of 4)]
[im 1/1]
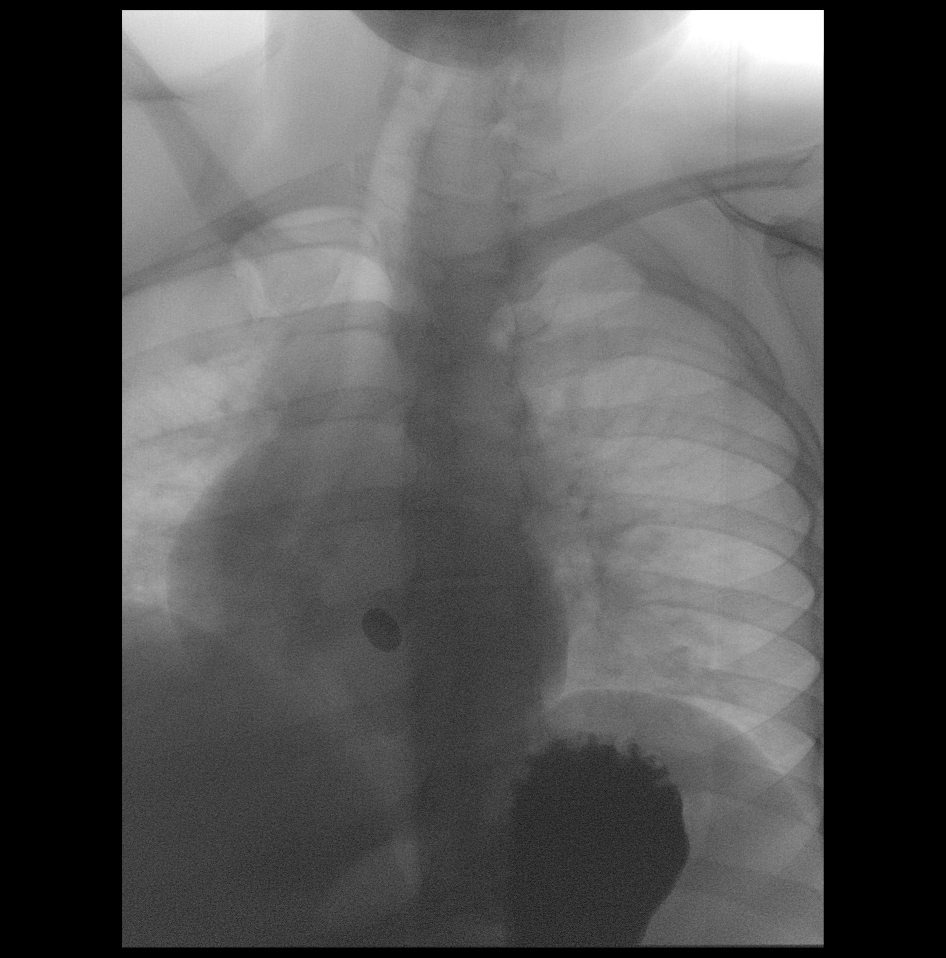

[Series 5: cp_standard · 0.35mm/px · 4 of 56 frames shown (4 of 4)]
[frame 9/56]
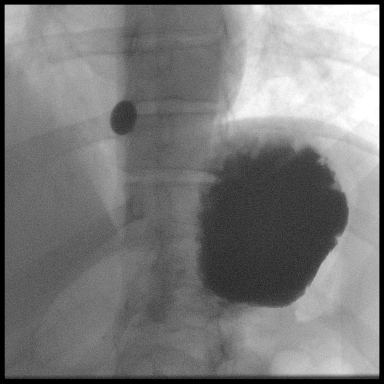
[frame 29/56]
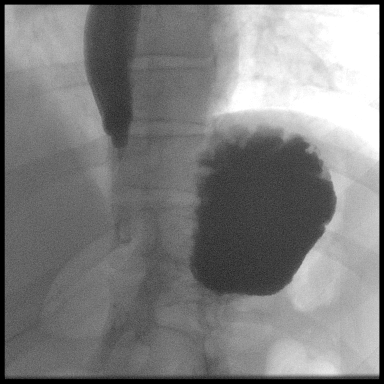
[frame 48/56]
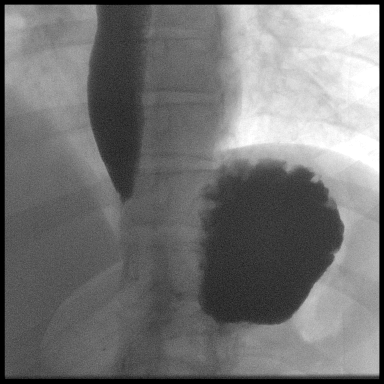
[frame 51/56]
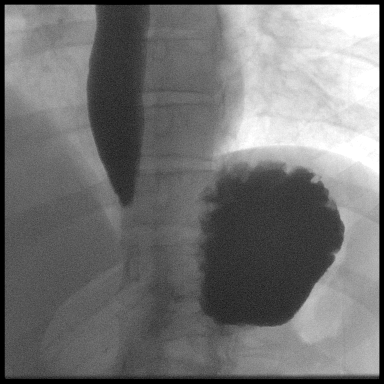

[Series 6: fluoro_barium 2fps_bw · 0.17mm/px · 1 of 1 slices shown (2 of 2)]
[im 1/1]
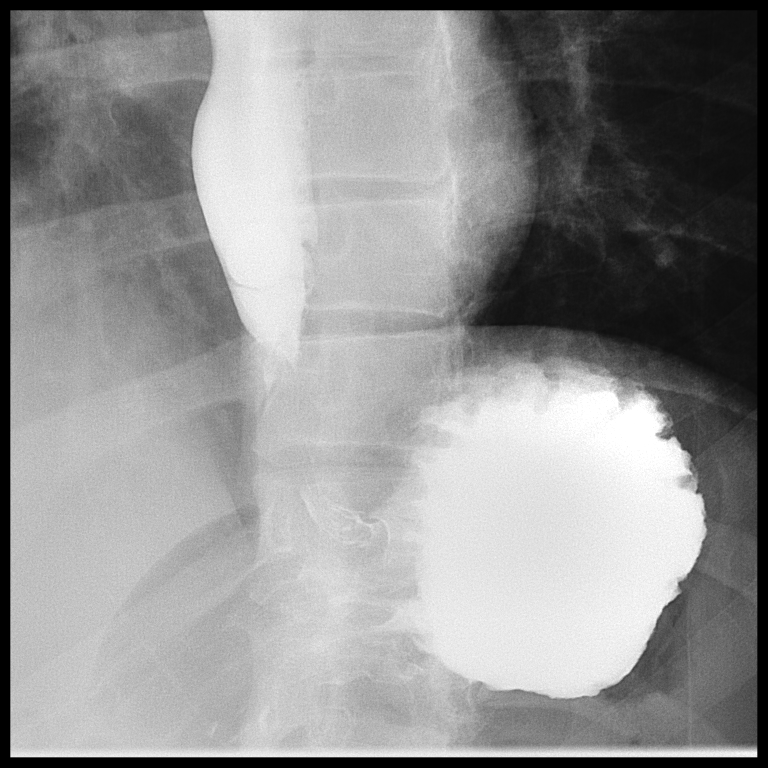

[12 of 12 positions shown; findings below may reference images not displayed]

FINDINGS: Exam is limited by patient weakness. Patient was unable to stand.
Patient was evaluated in semi upright position. Single contrast
examination of the esophagus demonstrated no obvious mucosal
irregularity, stricture or mass. Contrast flows through GE junction
easily.

No tertiary contractions were identified.

Barium tablet was administered which failed to pass the distal
esophagus. Tablet lodged proximally 4 cm from the GE junction. There
is a subtle thin indentation at this level suggests potential
stricture or web. Tablet failed to pass multiple sips of thin
barium.
IMPRESSION: 1. Barium tablet failed to pass the distal esophagus. Tablet lodged
approximately 4-5 cm from the GE junction. Suspicion of occult
mucosal inflammation or stricturing at this level.
2. No additional evidence mucosal irregularity, stricture, or mass.

## 2024-01-10 ENCOUNTER — Encounter (HOSPITAL_BASED_OUTPATIENT_CLINIC_OR_DEPARTMENT_OTHER): Payer: Self-pay | Admitting: Emergency Medicine

## 2024-01-10 ENCOUNTER — Emergency Department (HOSPITAL_BASED_OUTPATIENT_CLINIC_OR_DEPARTMENT_OTHER)

## 2024-01-10 ENCOUNTER — Emergency Department (HOSPITAL_BASED_OUTPATIENT_CLINIC_OR_DEPARTMENT_OTHER)
Admission: EM | Admit: 2024-01-10 | Discharge: 2024-01-11 | Disposition: A | Discharge status: Home / Self Care | Attending: Emergency Medicine | Admitting: Emergency Medicine

## 2024-01-10 ENCOUNTER — Other Ambulatory Visit: Payer: Self-pay

## 2024-01-10 DIAGNOSIS — M79662 Pain in left lower leg: Secondary | ICD-10-CM | POA: Insufficient documentation

## 2024-01-10 DIAGNOSIS — Y92410 Unspecified street and highway as the place of occurrence of the external cause: Secondary | ICD-10-CM | POA: Diagnosis not present

## 2024-01-10 DIAGNOSIS — M545 Low back pain, unspecified: Secondary | ICD-10-CM | POA: Diagnosis present

## 2024-01-10 DIAGNOSIS — M542 Cervicalgia: Secondary | ICD-10-CM | POA: Diagnosis not present

## 2024-01-10 DIAGNOSIS — Z794 Long term (current) use of insulin: Secondary | ICD-10-CM | POA: Insufficient documentation

## 2024-01-10 DIAGNOSIS — E119 Type 2 diabetes mellitus without complications: Secondary | ICD-10-CM | POA: Insufficient documentation

## 2024-01-10 DIAGNOSIS — Z7984 Long term (current) use of oral hypoglycemic drugs: Secondary | ICD-10-CM | POA: Insufficient documentation

## 2024-01-10 DIAGNOSIS — R739 Hyperglycemia, unspecified: Secondary | ICD-10-CM

## 2024-01-10 LAB — URINALYSIS, ROUTINE W REFLEX MICROSCOPIC
Bilirubin Urine: NEGATIVE
Glucose, UA: >=500 mg/dL — AB
Ketones, ur: NEGATIVE mg/dL
Leukocytes,Ua: NEGATIVE
Nitrite: NEGATIVE
Protein, ur: >=300 mg/dL — AB
Specific Gravity, Urine: 1.02 (ref 1.005–1.030)
pH: 5.5 (ref 5.0–8.0)

## 2024-01-10 LAB — WET PREP, GENITAL
Clue Cells Wet Prep HPF POC: NONE SEEN
Sperm: NONE SEEN
Trich, Wet Prep: NONE SEEN
WBC, Wet Prep HPF POC: <10 (ref <10)
Yeast Wet Prep HPF POC: NONE SEEN

## 2024-01-10 LAB — I-STAT VENOUS BLOOD GAS, ED
Acid-Base Excess: 0 mmol/L (ref 0.0–2.0)
Bicarbonate: 24.2 mmol/L (ref 20.0–28.0)
Calcium, Ion: 1.13 mmol/L — ABNORMAL LOW (ref 1.15–1.40)
HCT: 42 % (ref 36.0–46.0)
Hemoglobin: 14.3 g/dL (ref 12.0–15.0)
O2 Saturation: 89 %
Patient temperature: 98.2
Potassium: 4.6 mmol/L (ref 3.5–5.1)
Sodium: 133 mmol/L — ABNORMAL LOW (ref 135–145)
TCO2: 25 mmol/L (ref 22–32)
pCO2, Ven: 35.8 mmHg — ABNORMAL LOW (ref 44–60)
pH, Ven: 7.437 — ABNORMAL HIGH (ref 7.25–7.43)
pO2, Ven: 53 mmHg — ABNORMAL HIGH (ref 32–45)

## 2024-01-10 LAB — URINALYSIS, MICROSCOPIC (REFLEX): WBC, UA: NONE SEEN WBC/hpf (ref 0–5)

## 2024-01-10 LAB — PREGNANCY, URINE: Preg Test, Ur: NEGATIVE

## 2024-01-10 LAB — CBG MONITORING, ED
Glucose-Capillary: 507 mg/dL (ref 70–99)
Glucose-Capillary: 415 mg/dL — ABNORMAL HIGH (ref 70–99)

## 2024-01-10 LAB — BETA-HYDROXYBUTYRIC ACID: Beta-Hydroxybutyric Acid: 0.21 mmol/L (ref 0.05–0.27)

## 2024-01-10 MED ORDER — PROMETHAZINE HCL 25 MG/ML IJ SOLN
INTRAMUSCULAR | Status: AC
  Filled 2024-01-10: qty 1

## 2024-01-10 MED ORDER — OXYCODONE-ACETAMINOPHEN 5-325 MG PO TABS: 1.0000 | ORAL_TABLET | Freq: Once | ORAL | Status: DC

## 2024-01-10 MED ORDER — LORAZEPAM 2 MG/ML IJ SOLN
1.0000 mg | Freq: Once | INTRAMUSCULAR | Status: AC
  Administered 2024-01-10: 1 mg via INTRAVENOUS
  Filled 2024-01-10: qty 1

## 2024-01-10 MED ORDER — SODIUM CHLORIDE 0.9 % IV SOLN
12.5000 mg | Freq: Four times a day (QID) | INTRAVENOUS | Status: DC | PRN
  Administered 2024-01-11: 12.5 mg via INTRAVENOUS
  Filled 2024-01-10: qty 0.5

## 2024-01-10 MED ORDER — HYDROMORPHONE HCL 1 MG/ML IJ SOLN
1.0000 mg | Freq: Once | INTRAMUSCULAR | Status: AC
  Administered 2024-01-11: 1 mg via INTRAVENOUS
  Filled 2024-01-10: qty 1

## 2024-01-10 MED ORDER — SODIUM CHLORIDE 0.9 % IV SOLN
12.5000 mg | Freq: Four times a day (QID) | INTRAVENOUS | Status: DC | PRN
  Administered 2024-01-10: 12.5 mg via INTRAVENOUS
  Filled 2024-01-10: qty 0.5

## 2024-01-10 MED ORDER — PROMETHAZINE HCL 25 MG RE SUPP: 25.0000 mg | Freq: Four times a day (QID) | RECTAL | 0 refills | Status: DC | PRN

## 2024-01-10 MED ORDER — HYDROMORPHONE HCL 1 MG/ML IJ SOLN
0.5000 mg | Freq: Once | INTRAMUSCULAR | Status: AC
  Administered 2024-01-10: 0.5 mg via INTRAVENOUS
  Filled 2024-01-10: qty 1

## 2024-01-10 MED ORDER — SODIUM CHLORIDE 0.9 % IV BOLUS
1000.0000 mL | Freq: Once | INTRAVENOUS | Status: AC
  Administered 2024-01-10: 1000 mL via INTRAVENOUS

## 2024-01-10 MED ORDER — INSULIN ASPART PROT & ASPART (70-30 MIX) 100 UNIT/ML ~~LOC~~ SUSP
5.0000 [IU] | Freq: Once | SUBCUTANEOUS | Status: AC
  Administered 2024-01-10: 5 [IU] via SUBCUTANEOUS
  Filled 2024-01-10: qty 5

## 2024-01-10 NOTE — ED Notes (Signed)
 Pt states her glucose meter was reading high, checked and reading 507 with cbg monitor. Pt anxious, informed that will increased blood sugar levels.
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
## 2024-01-10 NOTE — ED Notes (Signed)
 Pt hooked up to the monitor with the 5 lead, bp cuff and pulse ox

## 2024-01-10 NOTE — ED Notes (Signed)
 HR elevated to 130 bpm; pt sts she is having anxiety

## 2024-01-10 NOTE — ED Provider Notes (Signed)
 Physical Exam  BP (!) 145/97 (BP Location: Left Arm)   Pulse (!) 105   Temp 99.8 F (37.7 C) (Oral)   Resp 12   Ht 5\' 11"  (1.803 m)   Wt 86.2 kg   SpO2 95%   BMI 26.50 kg/m   Physical Exam Vitals and nursing note reviewed.  Constitutional:      General: She is not in acute distress.    Appearance: Normal appearance. She is not ill-appearing.  Eyes:     General:        Right eye: No discharge.        Left eye: No discharge.     Extraocular Movements: Extraocular movements intact.     Conjunctiva/sclera: Conjunctivae normal.     Pupils: Pupils are equal, round, and reactive to light.  Cardiovascular:     Rate and Rhythm: Regular rhythm. Tachycardia present.     Pulses: Normal pulses.     Comments: Mildly tachycardic with BPM noted of low 100s to high 90s Pulmonary:     Effort: Pulmonary effort is normal.  Musculoskeletal:     Cervical back: Normal range of motion and neck supple. No rigidity.  Neurological:     General: No focal deficit present.     Mental Status: She is alert and oriented to person, place, and time. Mental status is at baseline.     Motor: No weakness.  Psychiatric:     Comments: Patient is very anxious upon evaluation, saying that she is active having a panic attack because her mom was not in the room.     Procedures  Procedures  ED Course / MDM   Clinical Course as of 01/10/24 2159  Tue Jan 10, 2024  1907 BG of 507, awaiting DKA workup. Awaiting wrist workup. Send home with Skelaxin.  [CB]    Clinical Course User Index [CB] Hayes Lipps, PA-C   Medical Decision Making Amount and/or Complexity of Data Reviewed Labs: ordered. Radiology: ordered.  Risk Prescription drug management.   Patient care handed over from [facility], PA-C, at time of handoff awaiting imaging and DKA workup.  Imaging and DKA workup was negative.  Patient was noted to be having an anxiety attack when evaluating and talking to patient, with patient actively  saying that she is experiencing a panic attack.  Patient was noted to have calm down once told that the pain medication and nausea medication will be provided.  Patient has follow-up with ENT for dysphagia issues.  Will provide outpatient Phenergan  suppositories for her to use for nausea.  Patient also has an extensive allergies to medications, patient does not wish to have any pain medications at this time sent home.  Provided her some Dilaudid  and Phenergan  via IV as patient says that she is taken it in the past with no difficulty.  Patient is a negative hydroxybutyrate acid as well as no ketones noted in the urine and i-STAT shows that she is not acidotic. Patient still having active anxiety, provided Ativan  per patient request.  Upon reevaluation, patient was noted that she said that she is wanting a yeast infection check as she thinks that she has 1 currently.  Wet prep negative.  Patient is willing to go home now after having received another dose of Dilaudid  and Phenergan .  Low suspicion for any emergent pathology present at this time.  Will have her continue with insulin  pump at home.  Insulin  5 units were given here and blood sugar has been improving.  Patient is wanting to go home at this time.  Discussed case with attending who agreed with plan.  Patient vital signs have remained stable throughout the course of patient's time in the ED. Low suspicion for any other emergent pathology at this time. I believe this patient is safe to be discharged. Provided strict return to ER precautions. Patient expressed agreement and understanding of plan. All questions were answered.   Hayes Lipps, PA-C 01/10/24 2350    Sallyanne Creamer, DO 01/13/24 2123
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
## 2024-01-10 NOTE — ED Notes (Signed)
 C-collar placed on pt.

## 2024-01-10 NOTE — ED Notes (Addendum)
 Checked [facility] 415 RN Misty and RN [employer] informed

## 2024-01-10 NOTE — ED Notes (Signed)
 Pt. Uses a cane for mobilization since 2020 and back surgery.
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
## 2024-01-11 NOTE — ED Notes (Signed)
 Pt c/o feeling itchy all over.

## 2024-02-06 ENCOUNTER — Encounter (HOSPITAL_BASED_OUTPATIENT_CLINIC_OR_DEPARTMENT_OTHER): Payer: Self-pay

## 2024-02-06 ENCOUNTER — Observation Stay (HOSPITAL_BASED_OUTPATIENT_CLINIC_OR_DEPARTMENT_OTHER)
Admission: EM | Admit: 2024-02-06 | Discharge: 2024-02-07 | Disposition: A | Discharge status: Home / Self Care | Attending: Internal Medicine | Admitting: Internal Medicine

## 2024-02-06 ENCOUNTER — Other Ambulatory Visit: Payer: Self-pay

## 2024-02-06 DIAGNOSIS — E861 Hypovolemia: Secondary | ICD-10-CM

## 2024-02-06 DIAGNOSIS — Z79899 Other long term (current) drug therapy: Secondary | ICD-10-CM | POA: Diagnosis not present

## 2024-02-06 DIAGNOSIS — Z794 Long term (current) use of insulin: Secondary | ICD-10-CM | POA: Diagnosis not present

## 2024-02-06 DIAGNOSIS — E1110 Type 2 diabetes mellitus with ketoacidosis without coma: Secondary | ICD-10-CM | POA: Insufficient documentation

## 2024-02-06 DIAGNOSIS — E871 Hypo-osmolality and hyponatremia: Secondary | ICD-10-CM | POA: Diagnosis not present

## 2024-02-06 DIAGNOSIS — E1010 Type 1 diabetes mellitus with ketoacidosis without coma: Principal | ICD-10-CM | POA: Diagnosis present

## 2024-02-06 DIAGNOSIS — E872 Acidosis, unspecified: Secondary | ICD-10-CM | POA: Diagnosis not present

## 2024-02-06 DIAGNOSIS — R1310 Dysphagia, unspecified: Secondary | ICD-10-CM

## 2024-02-06 DIAGNOSIS — K5909 Other constipation: Secondary | ICD-10-CM | POA: Diagnosis not present

## 2024-02-06 DIAGNOSIS — I959 Hypotension, unspecified: Secondary | ICD-10-CM | POA: Diagnosis present

## 2024-02-06 DIAGNOSIS — K219 Gastro-esophageal reflux disease without esophagitis: Secondary | ICD-10-CM | POA: Insufficient documentation

## 2024-02-06 DIAGNOSIS — Z87891 Personal history of nicotine dependence: Secondary | ICD-10-CM | POA: Insufficient documentation

## 2024-02-06 DIAGNOSIS — G894 Chronic pain syndrome: Secondary | ICD-10-CM | POA: Insufficient documentation

## 2024-02-06 DIAGNOSIS — Z91148 Patient's other noncompliance with medication regimen for other reason: Secondary | ICD-10-CM

## 2024-02-06 DIAGNOSIS — R531 Weakness: Secondary | ICD-10-CM | POA: Diagnosis present

## 2024-02-06 LAB — GLUCOSE, CAPILLARY
Glucose-Capillary: 113 mg/dL — ABNORMAL HIGH (ref 70–99)
Glucose-Capillary: 117 mg/dL — ABNORMAL HIGH (ref 70–99)
Glucose-Capillary: 133 mg/dL — ABNORMAL HIGH (ref 70–99)

## 2024-02-06 LAB — COMPREHENSIVE METABOLIC PANEL WITH GFR
ALT: 10 U/L (ref 0–44)
AST: 17 U/L (ref 15–41)
Albumin: 3.7 g/dL (ref 3.5–5.0)
Alkaline Phosphatase: 106 U/L (ref 38–126)
Anion gap: 16 — ABNORMAL HIGH (ref 5–15)
BUN: 26 mg/dL — ABNORMAL HIGH (ref 6–20)
CO2: 20 mmol/L — ABNORMAL LOW (ref 22–32)
Calcium: 8.8 mg/dL — ABNORMAL LOW (ref 8.9–10.3)
Chloride: 95 mmol/L — ABNORMAL LOW (ref 98–111)
Creatinine, Ser: 0.92 mg/dL (ref 0.44–1.00)
GFR, Estimated: >60 mL/min (ref >60)
Glucose, Bld: 624 mg/dL (ref 70–99)
Potassium: 4.4 mmol/L (ref 3.5–5.1)
Sodium: 131 mmol/L — ABNORMAL LOW (ref 135–145)
Total Bilirubin: <0.2 mg/dL (ref 0.0–1.2)
Total Protein: 7 g/dL (ref 6.5–8.1)

## 2024-02-06 LAB — URINALYSIS, MICROSCOPIC (REFLEX)
RBC / HPF: NONE SEEN RBC/hpf (ref 0–5)
WBC, UA: NONE SEEN WBC/hpf (ref 0–5)

## 2024-02-06 LAB — CBC WITH DIFFERENTIAL/PLATELET
Abs Immature Granulocytes: 0.03 10*3/uL (ref 0.00–0.07)
Basophils Absolute: 0 10*3/uL (ref 0.0–0.1)
Basophils Relative: 0 %
Eosinophils Absolute: 0.2 10*3/uL (ref 0.0–0.5)
Eosinophils Relative: 3 %
HCT: 37.8 % (ref 36.0–46.0)
Hemoglobin: 12.5 g/dL (ref 12.0–15.0)
Immature Granulocytes: 0 %
Lymphocytes Relative: 22 %
Lymphs Abs: 1.8 10*3/uL (ref 0.7–4.0)
MCH: 27.7 pg (ref 26.0–34.0)
MCHC: 33.1 g/dL (ref 30.0–36.0)
MCV: 83.6 fL (ref 80.0–100.0)
Monocytes Absolute: 0.6 10*3/uL (ref 0.1–1.0)
Monocytes Relative: 7 %
Neutro Abs: 5.5 10*3/uL (ref 1.7–7.7)
Neutrophils Relative %: 68 %
Platelets: 197 10*3/uL (ref 150–400)
RBC: 4.52 MIL/uL (ref 3.87–5.11)
RDW: 13 % (ref 11.5–15.5)
WBC: 8.2 10*3/uL (ref 4.0–10.5)
nRBC: 0 % (ref 0.0–0.2)

## 2024-02-06 LAB — CBG MONITORING, ED
Glucose-Capillary: >600 mg/dL (ref 70–99)
Glucose-Capillary: 377 mg/dL — ABNORMAL HIGH (ref 70–99)
Glucose-Capillary: 289 mg/dL — ABNORMAL HIGH (ref 70–99)

## 2024-02-06 LAB — PREGNANCY, URINE: Preg Test, Ur: NEGATIVE

## 2024-02-06 LAB — URINALYSIS, ROUTINE W REFLEX MICROSCOPIC
Bilirubin Urine: NEGATIVE
Glucose, UA: >=500 mg/dL — AB
Hgb urine dipstick: NEGATIVE
Ketones, ur: NEGATIVE mg/dL
Leukocytes,Ua: NEGATIVE
Nitrite: NEGATIVE
Protein, ur: NEGATIVE mg/dL
Specific Gravity, Urine: 1.01 (ref 1.005–1.030)
pH: 5.5 (ref 5.0–8.0)

## 2024-02-06 LAB — MRSA NEXT GEN BY PCR, NASAL: MRSA by PCR Next Gen: NOT DETECTED

## 2024-02-06 LAB — LIPASE, BLOOD: Lipase: 37 U/L (ref 11–51)

## 2024-02-06 LAB — BETA-HYDROXYBUTYRIC ACID: Beta-Hydroxybutyric Acid: 0.09 mmol/L (ref 0.05–0.27)

## 2024-02-06 MED ORDER — DEXTROSE 50 % IV SOLN: 0.0000 mL | INTRAVENOUS | Status: DC | PRN

## 2024-02-06 MED ORDER — HYDROCODONE-ACETAMINOPHEN 5-325 MG PO TABS
1.0000 | ORAL_TABLET | Freq: Once | ORAL | Status: DC
  Filled 2024-02-06: qty 1

## 2024-02-06 MED ORDER — INSULIN REGULAR(HUMAN) IN NACL 100-0.9 UT/100ML-% IV SOLN
INTRAVENOUS | Status: AC
  Administered 2024-02-06: 13 [IU]/h via INTRAVENOUS
  Filled 2024-02-06: qty 100

## 2024-02-06 MED ORDER — LACTATED RINGERS IV SOLN: INTRAVENOUS | Status: DC

## 2024-02-06 MED ORDER — HYDROMORPHONE HCL 1 MG/ML IJ SOLN
0.5000 mg | Freq: Once | INTRAMUSCULAR | Status: AC
  Administered 2024-02-06: 0.5 mg via INTRAVENOUS
  Filled 2024-02-06: qty 1

## 2024-02-06 MED ORDER — DIPHENHYDRAMINE HCL 25 MG PO CAPS
25.0000 mg | ORAL_CAPSULE | Freq: Once | ORAL | Status: DC
  Filled 2024-02-06: qty 1

## 2024-02-06 MED ORDER — DEXTROSE IN LACTATED RINGERS 5 % IV SOLN: INTRAVENOUS | Status: AC

## 2024-02-06 MED ORDER — DIPHENHYDRAMINE HCL 50 MG/ML IJ SOLN: 25.0000 mg | Freq: Once | INTRAMUSCULAR | Status: DC

## 2024-02-06 MED ORDER — SODIUM CHLORIDE 0.9 % IV BOLUS
1000.0000 mL | Freq: Once | INTRAVENOUS | Status: AC
  Administered 2024-02-06: 1000 mL via INTRAVENOUS

## 2024-02-06 MED ORDER — HYDROMORPHONE HCL 1 MG/ML IJ SOLN
0.5000 mg | INTRAMUSCULAR | Status: DC | PRN
  Administered 2024-02-07: 0.5 mg via INTRAVENOUS
  Filled 2024-02-06: qty 1

## 2024-02-06 MED ORDER — PANTOPRAZOLE SODIUM 40 MG PO TBEC
40.0000 mg | DELAYED_RELEASE_TABLET | Freq: Two times a day (BID) | ORAL | Status: DC
  Filled 2024-02-06: qty 1

## 2024-02-06 MED ORDER — ENOXAPARIN SODIUM 40 MG/0.4ML IJ SOSY
40.0000 mg | PREFILLED_SYRINGE | INTRAMUSCULAR | Status: DC
  Administered 2024-02-07: 40 mg via SUBCUTANEOUS
  Filled 2024-02-06: qty 0.4

## 2024-02-06 MED ORDER — PROMETHAZINE (PHENERGAN) 6.25MG IN NS 50ML IVPB
6.2500 mg | Freq: Four times a day (QID) | INTRAVENOUS | Status: DC | PRN
  Administered 2024-02-06 – 2024-02-07 (×3): 6.25 mg via INTRAVENOUS
  Filled 2024-02-06: qty 6.25
  Filled 2024-02-06: qty 50
  Filled 2024-02-06: qty 6.25

## 2024-02-06 MED ORDER — PROMETHAZINE HCL 25 MG/ML IJ SOLN
INTRAMUSCULAR | Status: AC
  Filled 2024-02-06: qty 1

## 2024-02-06 MED ORDER — CHLORHEXIDINE GLUCONATE CLOTH 2 % EX PADS
6.0000 | MEDICATED_PAD | Freq: Every day | CUTANEOUS | Status: DC
  Administered 2024-02-06 – 2024-02-07 (×2): 6 via TOPICAL

## 2024-02-06 NOTE — ED Notes (Signed)
 Unable to obtain urine, RN at bedside. Will return.

## 2024-02-06 NOTE — ED Notes (Signed)
 [facility] at bedside

## 2024-02-06 NOTE — ED Notes (Signed)
 Writer attempted to place peripheral IV twice unsuccessfully.
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
## 2024-02-06 NOTE — H&P (Signed)
 History and Physical    Patient: Julie [name] ZOX:[id number] DOB: [birth year] DOA: 02/06/2024 DOS: the patient was seen and examined on 02/06/2024 PCP: Climmie Damme, PA-C  Patient coming from: Home  Chief Complaint:  Chief Complaint  Patient presents with   Weakness   HPI: Julie [name] is a 33 y.o. female with medical history significant of type 1 diabetes, history of pelvic fracture status postlaminectomy with patient now having bilateral lower extremity weakness using wheelchair, history of noncompliance with treatment, depression with anxiety, who has insulin  pump.  Patient went to ER with generalized weakness, nausea and vomiting.  Patient was found to have a glucose of 624.  She has anion gap elevated at 16.  Also some metabolic acidosis.  At this point she was admitted with DKA type I.  Patient said her pump is working but her blood sugar has always been reading high.  She also complained of poor appetite in the last few weeks.  She has been unable to to eat solid food due to some dysphagia.  Also reported loose bowel movements.  She has to use a diaper.  Today alone she is gone at least 6 times.  Patient was admitted with DKA.  Review of Systems: As mentioned in the history of present illness. All other systems reviewed and are negative. Past Medical History:  Diagnosis Date   Bulimia nervosa    Dental caries    Diabetes mellitus without complication (HCC)    Genital warts    Noncompliance with diabetes treatment    Pelvic fracture [facility])    Past Surgical History:  Procedure Laterality Date   BIOPSY  05/30/2021   Procedure: BIOPSY;  Surgeon: Lajuan Pila, MD;  Location: [facility] ENDOSCOPY;  Service: Endoscopy;;   CESAREAN SECTION     CESAREAN SECTION  2012   ESOPHAGOGASTRODUODENOSCOPY (EGD) WITH PROPOFOL  N/A 05/30/2021   Procedure: ESOPHAGOGASTRODUODENOSCOPY (EGD) WITH PROPOFOL ;  Surgeon: Lajuan Pila, MD;  Location: [facility] ENDOSCOPY;  Service: Endoscopy;   Laterality: N/A;   Social History:  reports that she has quit smoking. Her smoking use included cigarettes. She has never used smokeless tobacco. She reports that she does not currently use drugs. She reports that she does not drink alcohol.  Allergies  Allergen Reactions   Iodinated Contrast Media Itching   Lidocaine  Anaphylaxis   Olanzapine Itching, Nausea And Vomiting, Nausea Only and Other (See Comments)   Ondansetron  Hcl Itching   Penicillins Itching    Has tolerated cephalosporins and zosyn   Prochlorperazine Anaphylaxis, Anxiety and Swelling    Pt states she can tolerate promethazine  without problem.   Metoclopramide  Other (See Comments) and Dermatitis    Cramps  Cramps throughout whole body    Other reaction(s): Cramps (ALLERGY/intolerance)    Cramps   Tramadol Nausea And Vomiting, Nausea Only and Other (See Comments)   Toradol  [Ketorolac  Tromethamine ]     Family History  Problem Relation Age of Onset   Diabetes Mother     Prior to Admission medications   Medication Sig Start Date End Date Taking? Authorizing Provider  busPIRone  (BUSPAR ) 15 MG tablet Take 15 mg by mouth 3 (three) times daily as needed for anxiety. 05/11/21   [provider]  diclofenac  Sodium (VOLTAREN ) 1 % GEL Apply 2 g topically 4 (four) times daily. 06/05/21   Pokhrel, Amador Bad, MD  gabapentin  (NEURONTIN ) 800 MG tablet Take 800 mg by mouth in the morning, at noon, in the evening, and at bedtime. 05/22/19   [provider]  hydrOXYzine  (  ATARAX /VISTARIL ) 25 MG tablet Take 12.5-25 mg by mouth every 8 (eight) hours as needed for anxiety. 01/10/21   [provider]  insulin  aspart (NOVOLOG  FLEXPEN) 100 UNIT/ML FlexPen Inject 12 Units into the skin 3 (three) times daily with meals. Patient taking differently: Inject 6 Units into the skin 3 (three) times daily with meals. 04/10/17   Mozell Arias, MD  insulin  detemir (LEVEMIR ) 100 UNIT/ML injection Inject 35 Units into the skin daily.     [provider]  Insulin  Pen Needle (NOVOFINE) 30G X 8 MM MISC Inject 10 each into the skin as needed. 07/27/13   Lovell Rubenstein, NP  lidocaine  (LIDODERM ) 5 % Place 1 patch onto the skin daily. Remove & Discard patch within 12 hours or as directed by MD 06/05/21   Pokhrel, Laxman, MD  naloxone [facility]) nasal spray 4 mg/0.1 mL Place 1 spray into the nose as needed. 03/02/21   [provider]  oxyCODONE  (OXY IR/ROXICODONE ) 5 MG immediate release tablet Take 10 mg by mouth every 8 (eight) hours as needed for pain. 05/23/21   [provider]  pantoprazole  (PROTONIX ) 40 MG tablet Take 1 tablet (40 mg total) by mouth 2 (two) times daily. 06/05/21   Pokhrel, Laxman, MD  promethazine  (PHENERGAN ) 25 MG suppository Place 1 suppository (25 mg total) rectally every 6 (six) hours as needed for nausea or vomiting. 01/10/24   Bauer, Collin S, PA-C  promethazine  (PHENERGAN ) 25 MG tablet Take 1 tablet (25 mg total) by mouth every 6 (six) hours as needed for refractory nausea / vomiting (if not helped with Zofran ). 06/05/21   Pokhrel, Laxman, MD  sucralfate  (CARAFATE ) 1 g tablet Take 1 tablet (1 g total) by mouth 4 (four) times daily -  with meals and at bedtime. 06/05/21 07/05/21  Pokhrel, Laxman, MD  traMADol (ULTRAM) 50 MG tablet Take 50 mg by mouth in the morning, at noon, and at bedtime. 05/13/19   [provider]  VYVANSE  40 MG capsule Take 40 mg by mouth daily. 05/24/21   [provider]  zolpidem  (AMBIEN ) 10 MG tablet Take 10 mg by mouth at bedtime as needed for sleep. 05/24/21   [provider]    Physical Exam: Vitals:   02/06/24 1602 02/06/24 1918 02/06/24 1930 02/06/24 2344  BP:  113/78 120/80   Pulse:  73 73   Resp:  11 12   Temp:  98 F (36.7 C)  97.7 F (36.5 C)  TempSrc:    Oral  SpO2:  98% 99%   Weight: 86.2 kg      Constitutional: Acutely ill looking, weak, NAD, calm, comfortable Eyes: PERRL, lids and conjunctivae normal ENMT: Mucous membranes  are dry. Posterior pharynx clear of any exudate or lesions.Normal dentition.  Neck: normal, supple, no masses, no thyromegaly Respiratory: clear to auscultation bilaterally, no wheezing, no crackles. Normal respiratory effort. No accessory muscle use.  Cardiovascular: Sinus tachycardia, no murmurs / rubs / gallops. No extremity edema. 2+ pedal pulses. No carotid bruits.  Abdomen: no tenderness, no masses palpated. No hepatosplenomegaly. Bowel sounds positive.  Musculoskeletal: Good range of motion, no joint swelling or tenderness, Skin: no rashes, lesions, ulcers. No induration Neurologic: CN 2-12 grossly intact. Sensation intact, DTR normal. Strength 5/5 in all 4.  Psychiatric: Normal judgment and insight. Alert and oriented x 3.  Depressed mood  Data Reviewed:  Heart rate is 102, white count 8.2 hemoglobin 12.5.  Sodium is 131, CO2 20, BUN 26 and creatinine 0.92.  Chloride 95  calcium 8.8 and glucose 624.  Anion gap of 16.  Urinalysis negative.  Assessment and Plan:  #1 DKA type I: Patient admitted.  On insulin  drip.  Follow the DKA protocol.  CBG every hour.  BMP every 4 hours.  Will continue to monitor until gap closes.  Patient apparently has been trying to get to the endocrinologist but her insurance was not accepted so could not get there.  #2 GERD with dysphagia: Patient on PPIs.  Will continue.  Patient needs GI referral or inpatient consult.  #3 chronic diarrhea: May be related to her diabetes.  Probably autonomic dysfunction.  If this persists again will get GI consult.  #4 bilateral lower extremity weakness: Patient has had previous surgery.  Apparently has also gone to PT and OT but still weak in the legs.  Patient will likely need to continue with outpatient follow-up.  She reported that surgery was supposed to be repeated but due to insurance issues has not been done.  Encourage patient to walk.  #5 chronic pain syndrome: Continue chronic pain medications.  #6  pseudohyponatremia: Due to hyperglycemia.    Advance Care Planning:   Code Status: Full Code   Consults: None  Family Communication: No family at bedside  Severity of Illness: The appropriate patient status for this patient is INPATIENT. Inpatient status is judged to be reasonable and necessary in order to provide the required intensity of service to ensure the patient's safety. The patient's presenting symptoms, physical exam findings, and initial radiographic and laboratory data in the context of their chronic comorbidities is felt to place them at high risk for further clinical deterioration. Furthermore, it is not anticipated that the patient will be medically stable for discharge from the hospital within 2 midnights of admission.   * I certify that at the point of admission it is my clinical judgment that the patient will require inpatient hospital care spanning beyond 2 midnights from the point of admission due to high intensity of service, high risk for further deterioration and high frequency of surveillance required.*  AuthorCarolin Chyle, MD 02/06/2024 11:47 PM  For on call review [email or web address].

## 2024-02-06 NOTE — ED Triage Notes (Signed)
 Pt arrives with c/o weakness for the past few days. Pt is a type 1 diabetic and states her BG have been reading HIGH on her monitor. Pt endorses n/v/d, ABD pain, and body pain. Per pt, she fell down the steps last week also.

## 2024-02-06 NOTE — ED Notes (Signed)
 Checked CBG 289 RN Cari informed

## 2024-02-06 NOTE — ED Notes (Signed)
 Pt reports she is NOT allergic to phenergan , she is allergic to compazine and zofran .

## 2024-02-06 NOTE — ED Notes (Signed)
 PT CBG reading HI

## 2024-02-06 NOTE — ED Notes (Signed)
 Pt reports she is unable to swallow pills at this time due to throat irritation, ongoing nausea.  EDP made aware.

## 2024-02-06 NOTE — Progress Notes (Signed)
 Hospitalist Transfer Note:    Nursing staff, Please call [facility] Admits & Consults System-Wide number on Amion [phone]) as soon as patient's arrival, so appropriate admitting provider can evaluate the pt.   Transferring facility: [facility] Requesting provider: Willian Harrow, PA (EDP at [facility]) Reason for transfer: admission for further evaluation and management of DKA.   33 y.o. F with history of poorly controlled type 1 diabetes mellitus, who presented to [facility] ED complaining of 2 to 3 days of nausea in the absence of vomiting, associated generalized weakness, headache, generalized abdominal discomfort as well as hyperglycemia.  She has reported a history of poorly controlled type 1 diabetes mellitus, complicated by a history of suboptimal compliance with outpatient medications.  No recent fever, chills, chest pain.  Over the last 2 to 3 days, her glucometer at home has been reading "high", associated with blood sugar values greater than 600.  Initial CBG at [facility] this evening was greater than 600, with blood sugar per CMP noted to be 624, with CO2 20, anion gap 16 ABG result is currently pending.  Liver enzymes and lipase are nonelevated.  CBC demonstrates white blood cell count of 8200.  Vital signs in the ED were notable for the following: Afebrile; initially she was noted to have mild sinus tachycardia, which is improved with IV fluids.  She is reported to be nontender on physical exam of the abdomen.  Started on insulin  drip via Endo tool.   Subsequently, I accepted this patient for transfer for observation to a sdu bed at [facility] or [facility] (first available) for further work-up and management of the above.      Camelia Cavalier, DO Hospitalist

## 2024-02-07 DIAGNOSIS — Z91148 Patient's other noncompliance with medication regimen for other reason: Secondary | ICD-10-CM | POA: Diagnosis not present

## 2024-02-07 DIAGNOSIS — E1010 Type 1 diabetes mellitus with ketoacidosis without coma: Secondary | ICD-10-CM | POA: Diagnosis not present

## 2024-02-07 LAB — GLUCOSE, CAPILLARY
Glucose-Capillary: 104 mg/dL — ABNORMAL HIGH (ref 70–99)
Glucose-Capillary: 119 mg/dL — ABNORMAL HIGH (ref 70–99)
Glucose-Capillary: 202 mg/dL — ABNORMAL HIGH (ref 70–99)
Glucose-Capillary: 157 mg/dL — ABNORMAL HIGH (ref 70–99)
Glucose-Capillary: 290 mg/dL — ABNORMAL HIGH (ref 70–99)

## 2024-02-07 LAB — BASIC METABOLIC PANEL WITH GFR
Anion gap: 6 (ref 5–15)
Anion gap: 7 (ref 5–15)
Anion gap: 8 (ref 5–15)
Anion gap: 8 (ref 5–15)
Anion gap: 9 (ref 5–15)
BUN: 14 mg/dL (ref 6–20)
BUN: 15 mg/dL (ref 6–20)
BUN: 18 mg/dL (ref 6–20)
BUN: 18 mg/dL (ref 6–20)
BUN: 21 mg/dL — ABNORMAL HIGH (ref 6–20)
CO2: 20 mmol/L — ABNORMAL LOW (ref 22–32)
CO2: 21 mmol/L — ABNORMAL LOW (ref 22–32)
CO2: 21 mmol/L — ABNORMAL LOW (ref 22–32)
CO2: 22 mmol/L (ref 22–32)
CO2: 24 mmol/L (ref 22–32)
Calcium: 8 mg/dL — ABNORMAL LOW (ref 8.9–10.3)
Calcium: 8.3 mg/dL — ABNORMAL LOW (ref 8.9–10.3)
Calcium: 8.3 mg/dL — ABNORMAL LOW (ref 8.9–10.3)
Calcium: 8.6 mg/dL — ABNORMAL LOW (ref 8.9–10.3)
Calcium: 8.6 mg/dL — ABNORMAL LOW (ref 8.9–10.3)
Chloride: 106 mmol/L (ref 98–111)
Chloride: 107 mmol/L (ref 98–111)
Chloride: 108 mmol/L (ref 98–111)
Chloride: 110 mmol/L (ref 98–111)
Chloride: 111 mmol/L (ref 98–111)
Creatinine, Ser: 0.54 mg/dL (ref 0.44–1.00)
Creatinine, Ser: 0.55 mg/dL (ref 0.44–1.00)
Creatinine, Ser: 0.57 mg/dL (ref 0.44–1.00)
Creatinine, Ser: 0.64 mg/dL (ref 0.44–1.00)
Creatinine, Ser: 0.77 mg/dL (ref 0.44–1.00)
GFR, Estimated: >60 mL/min (ref >60)
GFR, Estimated: >60 mL/min (ref >60)
GFR, Estimated: >60 mL/min (ref >60)
GFR, Estimated: >60 mL/min (ref >60)
GFR, Estimated: >60 mL/min (ref >60)
Glucose, Bld: 120 mg/dL — ABNORMAL HIGH (ref 70–99)
Glucose, Bld: 120 mg/dL — ABNORMAL HIGH (ref 70–99)
Glucose, Bld: 175 mg/dL — ABNORMAL HIGH (ref 70–99)
Glucose, Bld: 187 mg/dL — ABNORMAL HIGH (ref 70–99)
Glucose, Bld: 241 mg/dL — ABNORMAL HIGH (ref 70–99)
Potassium: 3.8 mmol/L (ref 3.5–5.1)
Potassium: 3.8 mmol/L (ref 3.5–5.1)
Potassium: 4.1 mmol/L (ref 3.5–5.1)
Potassium: 4.3 mmol/L (ref 3.5–5.1)
Potassium: 4.3 mmol/L (ref 3.5–5.1)
Sodium: 135 mmol/L (ref 135–145)
Sodium: 137 mmol/L (ref 135–145)
Sodium: 138 mmol/L (ref 135–145)
Sodium: 139 mmol/L (ref 135–145)
Sodium: 139 mmol/L (ref 135–145)

## 2024-02-07 LAB — HEMOGLOBIN A1C
Hgb A1c MFr Bld: 13.2 % — ABNORMAL HIGH (ref 4.8–5.6)
Mean Plasma Glucose: 332.14 mg/dL

## 2024-02-07 LAB — CBC
HCT: 35.8 % — ABNORMAL LOW (ref 36.0–46.0)
Hemoglobin: 11.5 g/dL — ABNORMAL LOW (ref 12.0–15.0)
MCH: 28.3 pg (ref 26.0–34.0)
MCHC: 32.1 g/dL (ref 30.0–36.0)
MCV: 88 fL (ref 80.0–100.0)
Platelets: 185 10*3/uL (ref 150–400)
RBC: 4.07 MIL/uL (ref 3.87–5.11)
RDW: 13 % (ref 11.5–15.5)
WBC: 8 10*3/uL (ref 4.0–10.5)
nRBC: 0 % (ref 0.0–0.2)

## 2024-02-07 LAB — BETA-HYDROXYBUTYRIC ACID
Beta-Hydroxybutyric Acid: 0.07 mmol/L (ref 0.05–0.27)
Beta-Hydroxybutyric Acid: 0.08 mmol/L (ref 0.05–0.27)
Beta-Hydroxybutyric Acid: 0.09 mmol/L (ref 0.05–0.27)
Beta-Hydroxybutyric Acid: 0.11 mmol/L (ref 0.05–0.27)

## 2024-02-07 MED ORDER — LORAZEPAM 2 MG/ML IJ SOLN
0.5000 mg | Freq: Once | INTRAMUSCULAR | Status: AC | PRN
  Administered 2024-02-07: 0.5 mg via INTRAVENOUS
  Filled 2024-02-07: qty 1

## 2024-02-07 MED ORDER — SODIUM CHLORIDE 0.9 % IV SOLN: INTRAVENOUS | Status: DC

## 2024-02-07 MED ORDER — INSULIN ASPART 100 UNIT/ML IJ SOLN: 0.0000 [IU] | Freq: Every day | INTRAMUSCULAR | Status: DC

## 2024-02-07 MED ORDER — INSULIN ASPART 100 UNIT/ML IJ SOLN
0.0000 [IU] | Freq: Three times a day (TID) | INTRAMUSCULAR | Status: DC
  Administered 2024-02-07: 3 [IU] via SUBCUTANEOUS

## 2024-02-07 MED ORDER — HYDROXYZINE HCL 10 MG PO TABS: 10.0000 mg | ORAL_TABLET | Freq: Once | ORAL | Status: DC | PRN

## 2024-02-07 MED ORDER — DIPHENHYDRAMINE HCL 50 MG/ML IJ SOLN: 12.5000 mg | Freq: Once | INTRAMUSCULAR | Status: DC

## 2024-02-07 MED ORDER — DIPHENHYDRAMINE HCL 50 MG/ML IJ SOLN
12.5000 mg | Freq: Once | INTRAMUSCULAR | Status: AC | PRN
  Administered 2024-02-07: 12.5 mg via INTRAVENOUS
  Filled 2024-02-07: qty 1

## 2024-02-07 MED ORDER — HYDROMORPHONE HCL 1 MG/ML IJ SOLN
0.5000 mg | INTRAMUSCULAR | Status: DC | PRN
  Administered 2024-02-07: 1 mg via INTRAVENOUS
  Filled 2024-02-07: qty 1

## 2024-02-07 MED ORDER — INSULIN GLARGINE-YFGN 100 UNIT/ML ~~LOC~~ SOLN
35.0000 [IU] | Freq: Every day | SUBCUTANEOUS | Status: DC
Start: 2024-02-07 — End: 2024-02-07
  Administered 2024-02-07: 35 [IU] via SUBCUTANEOUS
  Filled 2024-02-07: qty 0.35
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
## 2024-02-07 NOTE — Plan of Care (Signed)
  Problem: Clinical Measurements: Goal: Respiratory complications will improve Outcome: Progressing Goal: Cardiovascular complication will be avoided Outcome: Progressing   Problem: Safety: Goal: Ability to remain free from injury will improve Outcome: Progressing   Problem: Metabolic: Goal: Ability to maintain appropriate glucose levels will improve Outcome: Progressing   Problem: Skin Integrity: Goal: Risk for impaired skin integrity will decrease Outcome: Progressing   Problem: Metabolic: Goal: Ability to maintain appropriate glucose levels will improve Outcome: Progressing
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
## 2024-02-08 LAB — MISC LABCORP TEST (SEND OUT): Labcorp test code: 83935

## 2024-02-10 ENCOUNTER — Other Ambulatory Visit: Payer: Self-pay

## 2024-02-10 ENCOUNTER — Inpatient Hospital Stay (HOSPITAL_BASED_OUTPATIENT_CLINIC_OR_DEPARTMENT_OTHER)
Admission: EM | Admit: 2024-02-10 | Discharge: 2024-02-13 | DRG: 638 | Disposition: A | Discharge status: Home / Self Care | Attending: Internal Medicine | Admitting: Internal Medicine

## 2024-02-10 ENCOUNTER — Encounter (HOSPITAL_BASED_OUTPATIENT_CLINIC_OR_DEPARTMENT_OTHER): Payer: Self-pay

## 2024-02-10 DIAGNOSIS — Z794 Long term (current) use of insulin: Secondary | ICD-10-CM

## 2024-02-10 DIAGNOSIS — R300 Dysuria: Secondary | ICD-10-CM | POA: Diagnosis present

## 2024-02-10 DIAGNOSIS — Z880 Allergy status to penicillin: Secondary | ICD-10-CM

## 2024-02-10 DIAGNOSIS — Z79899 Other long term (current) drug therapy: Secondary | ICD-10-CM

## 2024-02-10 DIAGNOSIS — E1043 Type 1 diabetes mellitus with diabetic autonomic (poly)neuropathy: Secondary | ICD-10-CM | POA: Diagnosis present

## 2024-02-10 DIAGNOSIS — Z87891 Personal history of nicotine dependence: Secondary | ICD-10-CM

## 2024-02-10 DIAGNOSIS — F339 Major depressive disorder, recurrent, unspecified: Secondary | ICD-10-CM | POA: Diagnosis present

## 2024-02-10 DIAGNOSIS — R739 Hyperglycemia, unspecified: Secondary | ICD-10-CM

## 2024-02-10 DIAGNOSIS — Z885 Allergy status to narcotic agent status: Secondary | ICD-10-CM

## 2024-02-10 DIAGNOSIS — Z888 Allergy status to other drugs, medicaments and biological substances status: Secondary | ICD-10-CM

## 2024-02-10 DIAGNOSIS — E1110 Type 2 diabetes mellitus with ketoacidosis without coma: Secondary | ICD-10-CM | POA: Diagnosis present

## 2024-02-10 DIAGNOSIS — K3184 Gastroparesis: Secondary | ICD-10-CM | POA: Diagnosis present

## 2024-02-10 DIAGNOSIS — R339 Retention of urine, unspecified: Secondary | ICD-10-CM | POA: Diagnosis present

## 2024-02-10 DIAGNOSIS — Z833 Family history of diabetes mellitus: Secondary | ICD-10-CM

## 2024-02-10 DIAGNOSIS — F909 Attention-deficit hyperactivity disorder, unspecified type: Secondary | ICD-10-CM | POA: Diagnosis present

## 2024-02-10 DIAGNOSIS — F4310 Post-traumatic stress disorder, unspecified: Secondary | ICD-10-CM | POA: Diagnosis present

## 2024-02-10 DIAGNOSIS — Z882 Allergy status to sulfonamides status: Secondary | ICD-10-CM

## 2024-02-10 DIAGNOSIS — Z91148 Patient's other noncompliance with medication regimen for other reason: Secondary | ICD-10-CM

## 2024-02-10 DIAGNOSIS — M549 Dorsalgia, unspecified: Secondary | ICD-10-CM | POA: Diagnosis present

## 2024-02-10 DIAGNOSIS — Z886 Allergy status to analgesic agent status: Secondary | ICD-10-CM

## 2024-02-10 DIAGNOSIS — G8929 Other chronic pain: Secondary | ICD-10-CM | POA: Diagnosis present

## 2024-02-10 DIAGNOSIS — E1010 Type 1 diabetes mellitus with ketoacidosis without coma: Principal | ICD-10-CM | POA: Diagnosis present

## 2024-02-10 DIAGNOSIS — Z91041 Radiographic dye allergy status: Secondary | ICD-10-CM

## 2024-02-10 DIAGNOSIS — R1310 Dysphagia, unspecified: Secondary | ICD-10-CM | POA: Diagnosis present

## 2024-02-10 DIAGNOSIS — N179 Acute kidney failure, unspecified: Principal | ICD-10-CM

## 2024-02-10 DIAGNOSIS — F411 Generalized anxiety disorder: Secondary | ICD-10-CM

## 2024-02-10 DIAGNOSIS — Z9641 Presence of insulin pump (external) (internal): Secondary | ICD-10-CM | POA: Diagnosis present

## 2024-02-10 DIAGNOSIS — F418 Other specified anxiety disorders: Secondary | ICD-10-CM | POA: Diagnosis present

## 2024-02-10 LAB — CBG MONITORING, ED: Glucose-Capillary: >600 mg/dL (ref 70–99)

## 2024-02-10 MED ORDER — SODIUM CHLORIDE 0.9 % IV BOLUS
2000.0000 mL | Freq: Once | INTRAVENOUS | Status: AC
  Administered 2024-02-11: 2000 mL via INTRAVENOUS

## 2024-02-10 MED ORDER — DIPHENHYDRAMINE HCL 12.5 MG/5ML PO ELIX
25.0000 mg | ORAL_SOLUTION | Freq: Once | ORAL | Status: AC
  Administered 2024-02-11: 25 mg via ORAL
  Filled 2024-02-10: qty 10

## 2024-02-10 MED ORDER — SODIUM CHLORIDE 0.9 % IV SOLN
12.5000 mg | Freq: Four times a day (QID) | INTRAVENOUS | Status: DC | PRN
  Administered 2024-02-11: 12.5 mg via INTRAVENOUS
  Filled 2024-02-10: qty 0.5

## 2024-02-10 NOTE — ED Provider Notes (Signed)
 [place] [facility] Provider Note   CSN: [id number] Arrival date & time: 02/10/24  2312     History  Chief Complaint  Patient presents with   Hyperglycemia    Julie [name] is a 33 y.o. female.  The history is provided by the patient and medical records.  Hyperglycemia Julie [name] is a 33 y.o. female who presents to the Emergency Department complaining of I think I have DKA.  She presents to the emergency department for evaluation of symptoms that are consistent with patient's prior DKA episodes.  She complains of nausea, vomiting and cramps all over her body with associated diarrhea and abdominal discomfort.  She does report associated dysuria and reports a temperature 100 last night.  No known sick contacts.  No cough.  She has a history of insulin  dependent diabetes since the age of 53.  She also complains of diffuse itching throughout her body, which is a chronic issue.   Home Medications Prior to Admission medications   Medication Sig Start Date End Date Taking? Authorizing Provider  amphetamine-dextroamphetamine (ADDERALL) 20 MG tablet Take 20 mg by mouth daily. 01/17/24   [provider]  citalopram (CELEXA) 40 MG tablet Take 40 mg by mouth daily. 12/26/23   [provider]  Continuous Glucose Sensor (DEXCOM G6 SENSOR) MISC Place 1 each onto the skin See admin instructions. Change sensor every 10 days 12/13/23 12/12/24  [provider]  cyclobenzaprine  (FLEXERIL ) 10 MG tablet Take 10 mg by mouth 3 (three) times daily as needed for muscle spasms.    [provider]  diazepam (VALIUM) 10 MG tablet Take 10 mg by mouth every 12 (twelve) hours as needed for anxiety. 08/31/23   [provider]  insulin  aspart (NOVOLOG  FLEXPEN) 100 UNIT/ML FlexPen Inject 12 Units into the skin 3 (three) times daily with meals. Patient taking differently: Inject 1-16 Units into the skin 3 (three) times  daily with meals. 04/10/17   Mozell Arias, MD  Insulin  Disposable Pump (OMNIPOD 5 DEXG7G6 PODS GEN 5) MISC Inject 1 Device into the skin every 3 (three) days.    [provider]  linaclotide Glory Larsen) 145 MCG CAPS capsule Take 145 mcg by mouth daily before breakfast. 12/26/23   [provider]  lisdexamfetamine (VYVANSE ) 60 MG capsule Take 60 mg by mouth daily. 05/24/21   [provider]  prazosin (MINIPRESS) 1 MG capsule Take 1 mg by mouth at bedtime as needed (sleep). 11/18/22 05/04/24  [provider]  pregabalin (LYRICA) 200 MG capsule Take 200 mg by mouth 3 (three) times daily.    [provider]  promethazine  (PHENERGAN ) 12.5 MG tablet Take 12.5 mg by mouth every 12 (twelve) hours as needed for nausea, vomiting or refractory nausea / vomiting.    [provider]  promethazine  (PHENERGAN ) 25 MG suppository Place 1 suppository (25 mg total) rectally every 6 (six) hours as needed for nausea or vomiting. 01/10/24   Bauer, Collin S, PA-C  SUMAtriptan (IMITREX) 100 MG tablet Take 100 mg by mouth every 2 (two) hours as needed for migraine or headache. MAX 2 doses (200mg ) in 24 hours 02/04/24 05/04/24  [provider]  verapamil (CALAN) 120 MG tablet Take 120 mg by mouth 3 (three) times daily. 12/26/23   [provider]      Allergies    Chloroprocaine, Iodinated contrast media, Lidocaine , Olanzapine, Ondansetron  hcl, Penicillins, Prochlorperazine, Sulfa  antibiotics, Toradol  [ketorolac  tromethamine ], Metoclopramide , Tramadol, Celebrex [celecoxib], and Hydroxyzine   Review of Systems   Review of Systems  All other systems reviewed and are negative.   Physical Exam Updated Vital Signs BP 119/78   Pulse 93   Temp (!) 97.5 F (36.4 C) (Oral)   Resp 18   LMP  (LMP Unknown)   SpO2 99%  Physical Exam Vitals and nursing note reviewed.  Constitutional:      Appearance: She is well-developed. She is ill-appearing.  HENT:      Head: Normocephalic and atraumatic.     Mouth/Throat:     Mouth: Mucous membranes are dry.  Cardiovascular:     Rate and Rhythm: Regular rhythm. Tachycardia present.     Heart sounds: No murmur heard. Pulmonary:     Effort: Pulmonary effort is normal. No respiratory distress.     Breath sounds: Normal breath sounds.  Abdominal:     Palpations: Abdomen is soft.     Tenderness: There is no abdominal tenderness. There is no guarding or rebound.  Musculoskeletal:        General: No tenderness.  Skin:    General: Skin is warm and dry.  Neurological:     Mental Status: She is alert and oriented to person, place, and time.  Psychiatric:        Behavior: Behavior normal.     ED Results / Procedures / Treatments   Labs (all labs ordered are listed, but only abnormal results are displayed) Labs Reviewed  BASIC METABOLIC PANEL WITH GFR - Abnormal; Notable for the following components:      Result Value   Sodium 125 (*)    Chloride 83 (*)    CO2 18 (*)    Glucose, Bld 709 (*)    BUN 26 (*)    Creatinine, Ser 1.18 (*)    Calcium 8.8 (*)    Anion gap 23 (*)    All other components within normal limits  HEPATIC FUNCTION PANEL - Abnormal; Notable for the following components:   Total Protein 6.1 (*)    Albumin 3.2 (*)    AST 12 (*)    All other components within normal limits  BASIC METABOLIC PANEL WITH GFR - Abnormal; Notable for the following components:   Sodium 131 (*)    Chloride 94 (*)    CO2 21 (*)    Glucose, Bld 518 (*)    BUN 22 (*)    Calcium 7.9 (*)    Anion gap 16 (*)    All other components within normal limits  GLUCOSE, CAPILLARY - Abnormal; Notable for the following components:   Glucose-Capillary 328 (*)    All other components within normal limits  CBG MONITORING, ED - Abnormal; Notable for the following components:   Glucose-Capillary >600 (*)    All other components within normal limits  CBG MONITORING, ED - Abnormal; Notable for the following components:    Glucose-Capillary 522 (*)    All other components within normal limits  I-STAT VENOUS BLOOD GAS, ED - Abnormal; Notable for the following components:   pCO2, Ven 38.8 (*)    pO2, Ven 46 (*)    Acid-base deficit 4.0 (*)    Sodium 122 (*)    All other components within normal limits  CBG MONITORING, ED - Abnormal; Notable for the following components:   Glucose-Capillary 424 (*)    All other components within normal limits  MRSA NEXT GEN BY PCR, NASAL  CBC  LIPASE, BLOOD  URINALYSIS, ROUTINE W REFLEX MICROSCOPIC  PREGNANCY, URINE  BASIC METABOLIC PANEL WITH GFR  BASIC METABOLIC PANEL WITH GFR  BASIC METABOLIC PANEL WITH GFR  BASIC METABOLIC PANEL WITH GFR  BETA-HYDROXYBUTYRIC ACID  BETA-HYDROXYBUTYRIC ACID  BETA-HYDROXYBUTYRIC ACID  BETA-HYDROXYBUTYRIC ACID  BETA-HYDROXYBUTYRIC ACID  OSMOLALITY  CBG MONITORING, ED    EKG None  Radiology No results found.  Procedures Procedures   CRITICAL CARE Performed by: Kelsey Patricia   Total critical care time: 35 minutes  Critical care time was exclusive of separately billable procedures and treating other patients.  Critical care was necessary to treat or prevent imminent or life-threatening deterioration.  Critical care was time spent personally by me on the following activities: development of treatment plan with patient and/or surrogate as well as nursing, discussions with consultants, evaluation of patient's response to treatment, examination of patient, obtaining history from patient or surrogate, ordering and performing treatments and interventions, ordering and review of laboratory studies, ordering and review of radiographic studies, pulse oximetry and re-evaluation of patient's condition.  Medications Ordered in ED Medications  promethazine  (PHENERGAN ) 12.5 mg in sodium chloride  0.9 % 50 mL IVPB (0 mg Intravenous Stopped 02/11/24 0140)  promethazine  (PHENERGAN ) 25 MG/ML injection (  Not Given 02/11/24 0128)  insulin   regular, human (MYXREDLIN ) 100 units/ 100 mL infusion (9.5 Units/hr Intravenous Rate/Dose Change 02/11/24 0435)  lactated ringers  infusion ( Intravenous New Bag/Given 02/11/24 0233)  dextrose  5 % in lactated ringers  infusion (0 mLs Intravenous Hold 02/11/24 0158)  dextrose  50 % solution 0-50 mL (has no administration in time range)  Chlorhexidine  Gluconate Cloth 2 % PADS 6 each (has no administration in time range)  Oral care mouth rinse (has no administration in time range)  sodium chloride  0.9 % bolus 2,000 mL (0 mLs Intravenous Stopped 02/11/24 0215)  diphenhydrAMINE  (BENADRYL ) 12.5 MG/5ML elixir 25 mg (25 mg Oral Given 02/11/24 0118)    ED Course/ Medical Decision Making/ A&P                                 Medical Decision Making Amount and/or Complexity of Data Reviewed Labs: ordered.  Risk Prescription drug management. Decision regarding hospitalization.   Patient with history of insulin -dependent diabetes, recurrent DKA here for evaluation of vomiting, abdominal/full body cramping similar to her prior DKA episodes.  CBG is greater than 600.  Labs concerning with AKI, early DKA despite normal pH.  She was started on IV fluid hydration, antiemetic, glucose stabilizer for DKA.  Patient is requesting IV narcotics, concerned that narcotics may worsen her respiratory rate/resultant sedation as well as increase risk for vomiting.  Discussed that these are not recommended at this time.  Examination is not consistent with SBO.  Discussed with patient recommendation for admission for ongoing care and she is in agreement with admission.  Hospitalist consulted for admission.       Final Clinical Impression(s) / ED Diagnoses Final diagnoses:  AKI (acute kidney injury) (HCC)  Hyperglycemia    Rx / DC Orders ED Discharge Orders     None         Kelsey Patricia, MD 02/11/24 [id number]

## 2024-02-10 NOTE — ED Triage Notes (Signed)
 Pt states that she thinks that she is in DKA. She reports that her dexcom had been reading "high". She has generalized body aches, vomiting and generalized unwell feeling. This is similar presentation to previous episodes of DKA per pt

## 2024-02-11 ENCOUNTER — Encounter (HOSPITAL_COMMUNITY): Payer: Self-pay | Admitting: Family Medicine

## 2024-02-11 DIAGNOSIS — G8929 Other chronic pain: Secondary | ICD-10-CM | POA: Diagnosis present

## 2024-02-11 DIAGNOSIS — Z888 Allergy status to other drugs, medicaments and biological substances status: Secondary | ICD-10-CM | POA: Diagnosis not present

## 2024-02-11 DIAGNOSIS — Z882 Allergy status to sulfonamides status: Secondary | ICD-10-CM | POA: Diagnosis not present

## 2024-02-11 DIAGNOSIS — M549 Dorsalgia, unspecified: Secondary | ICD-10-CM | POA: Diagnosis present

## 2024-02-11 DIAGNOSIS — Z833 Family history of diabetes mellitus: Secondary | ICD-10-CM | POA: Diagnosis not present

## 2024-02-11 DIAGNOSIS — Z880 Allergy status to penicillin: Secondary | ICD-10-CM | POA: Diagnosis not present

## 2024-02-11 DIAGNOSIS — Z87891 Personal history of nicotine dependence: Secondary | ICD-10-CM | POA: Diagnosis not present

## 2024-02-11 DIAGNOSIS — R339 Retention of urine, unspecified: Secondary | ICD-10-CM | POA: Diagnosis present

## 2024-02-11 DIAGNOSIS — Z9641 Presence of insulin pump (external) (internal): Secondary | ICD-10-CM | POA: Diagnosis present

## 2024-02-11 DIAGNOSIS — Z79899 Other long term (current) drug therapy: Secondary | ICD-10-CM | POA: Diagnosis not present

## 2024-02-11 DIAGNOSIS — F418 Other specified anxiety disorders: Secondary | ICD-10-CM | POA: Diagnosis not present

## 2024-02-11 DIAGNOSIS — Z885 Allergy status to narcotic agent status: Secondary | ICD-10-CM | POA: Diagnosis not present

## 2024-02-11 DIAGNOSIS — F4310 Post-traumatic stress disorder, unspecified: Secondary | ICD-10-CM

## 2024-02-11 DIAGNOSIS — R1310 Dysphagia, unspecified: Secondary | ICD-10-CM | POA: Diagnosis present

## 2024-02-11 DIAGNOSIS — F339 Major depressive disorder, recurrent, unspecified: Secondary | ICD-10-CM | POA: Diagnosis present

## 2024-02-11 DIAGNOSIS — Z886 Allergy status to analgesic agent status: Secondary | ICD-10-CM | POA: Diagnosis not present

## 2024-02-11 DIAGNOSIS — Z794 Long term (current) use of insulin: Secondary | ICD-10-CM | POA: Diagnosis not present

## 2024-02-11 DIAGNOSIS — K3184 Gastroparesis: Secondary | ICD-10-CM | POA: Diagnosis present

## 2024-02-11 DIAGNOSIS — E1043 Type 1 diabetes mellitus with diabetic autonomic (poly)neuropathy: Secondary | ICD-10-CM | POA: Diagnosis present

## 2024-02-11 DIAGNOSIS — E1010 Type 1 diabetes mellitus with ketoacidosis without coma: Secondary | ICD-10-CM | POA: Diagnosis present

## 2024-02-11 DIAGNOSIS — F411 Generalized anxiety disorder: Secondary | ICD-10-CM | POA: Diagnosis present

## 2024-02-11 DIAGNOSIS — R300 Dysuria: Secondary | ICD-10-CM | POA: Diagnosis present

## 2024-02-11 DIAGNOSIS — F909 Attention-deficit hyperactivity disorder, unspecified type: Secondary | ICD-10-CM | POA: Diagnosis present

## 2024-02-11 DIAGNOSIS — Z91148 Patient's other noncompliance with medication regimen for other reason: Secondary | ICD-10-CM | POA: Diagnosis not present

## 2024-02-11 DIAGNOSIS — Z91041 Radiographic dye allergy status: Secondary | ICD-10-CM | POA: Diagnosis not present

## 2024-02-11 DIAGNOSIS — E0810 Diabetes mellitus due to underlying condition with ketoacidosis without coma: Secondary | ICD-10-CM | POA: Diagnosis not present

## 2024-02-11 LAB — GLUCOSE, CAPILLARY
Glucose-Capillary: 269 mg/dL — ABNORMAL HIGH (ref 70–99)
Glucose-Capillary: 328 mg/dL — ABNORMAL HIGH (ref 70–99)
Glucose-Capillary: 179 mg/dL — ABNORMAL HIGH (ref 70–99)
Glucose-Capillary: 144 mg/dL — ABNORMAL HIGH (ref 70–99)
Glucose-Capillary: 170 mg/dL — ABNORMAL HIGH (ref 70–99)
Glucose-Capillary: 168 mg/dL — ABNORMAL HIGH (ref 70–99)
Glucose-Capillary: 169 mg/dL — ABNORMAL HIGH (ref 70–99)
Glucose-Capillary: 173 mg/dL — ABNORMAL HIGH (ref 70–99)
Glucose-Capillary: 164 mg/dL — ABNORMAL HIGH (ref 70–99)
Glucose-Capillary: 153 mg/dL — ABNORMAL HIGH (ref 70–99)
Glucose-Capillary: 130 mg/dL — ABNORMAL HIGH (ref 70–99)
Glucose-Capillary: 150 mg/dL — ABNORMAL HIGH (ref 70–99)
Glucose-Capillary: 350 mg/dL — ABNORMAL HIGH (ref 70–99)

## 2024-02-11 LAB — I-STAT VENOUS BLOOD GAS, ED
Acid-base deficit: 4 mmol/L — ABNORMAL HIGH (ref 0.0–2.0)
Bicarbonate: 21.5 mmol/L (ref 20.0–28.0)
Calcium, Ion: 1.16 mmol/L (ref 1.15–1.40)
HCT: 36 % (ref 36.0–46.0)
Hemoglobin: 12.2 g/dL (ref 12.0–15.0)
O2 Saturation: 80 %
Patient temperature: 97.6
Potassium: 5 mmol/L (ref 3.5–5.1)
Sodium: 122 mmol/L — ABNORMAL LOW (ref 135–145)
TCO2: 23 mmol/L (ref 22–32)
pCO2, Ven: 38.8 mmHg — ABNORMAL LOW (ref 44–60)
pH, Ven: 7.348 (ref 7.25–7.43)
pO2, Ven: 46 mmHg — ABNORMAL HIGH (ref 32–45)

## 2024-02-11 LAB — CBC
HCT: 37.6 % (ref 36.0–46.0)
Hemoglobin: 12.9 g/dL (ref 12.0–15.0)
MCH: 27.4 pg (ref 26.0–34.0)
MCHC: 34.3 g/dL (ref 30.0–36.0)
MCV: 80 fL (ref 80.0–100.0)
Platelets: 211 10*3/uL (ref 150–400)
RBC: 4.7 MIL/uL (ref 3.87–5.11)
RDW: 12.6 % (ref 11.5–15.5)
WBC: 7 10*3/uL (ref 4.0–10.5)
nRBC: 0 % (ref 0.0–0.2)

## 2024-02-11 LAB — BASIC METABOLIC PANEL WITH GFR
Anion gap: 23 — ABNORMAL HIGH (ref 5–15)
Anion gap: 16 — ABNORMAL HIGH (ref 5–15)
Anion gap: 8 (ref 5–15)
Anion gap: 7 (ref 5–15)
BUN: 26 mg/dL — ABNORMAL HIGH (ref 6–20)
BUN: 22 mg/dL — ABNORMAL HIGH (ref 6–20)
BUN: 22 mg/dL — ABNORMAL HIGH (ref 6–20)
BUN: 23 mg/dL — ABNORMAL HIGH (ref 6–20)
CO2: 18 mmol/L — ABNORMAL LOW (ref 22–32)
CO2: 21 mmol/L — ABNORMAL LOW (ref 22–32)
CO2: 25 mmol/L (ref 22–32)
CO2: 26 mmol/L (ref 22–32)
Calcium: 8.8 mg/dL — ABNORMAL LOW (ref 8.9–10.3)
Calcium: 7.9 mg/dL — ABNORMAL LOW (ref 8.9–10.3)
Calcium: 8.3 mg/dL — ABNORMAL LOW (ref 8.9–10.3)
Calcium: 8.2 mg/dL — ABNORMAL LOW (ref 8.9–10.3)
Chloride: 83 mmol/L — ABNORMAL LOW (ref 98–111)
Chloride: 94 mmol/L — ABNORMAL LOW (ref 98–111)
Chloride: 103 mmol/L (ref 98–111)
Chloride: 101 mmol/L (ref 98–111)
Creatinine, Ser: 1.18 mg/dL — ABNORMAL HIGH (ref 0.44–1.00)
Creatinine, Ser: 0.96 mg/dL (ref 0.44–1.00)
Creatinine, Ser: 0.75 mg/dL (ref 0.44–1.00)
Creatinine, Ser: 0.78 mg/dL (ref 0.44–1.00)
GFR, Estimated: >60 mL/min (ref >60)
GFR, Estimated: >60 mL/min (ref >60)
GFR, Estimated: >60 mL/min (ref >60)
GFR, Estimated: >60 mL/min (ref >60)
Glucose, Bld: 709 mg/dL (ref 70–99)
Glucose, Bld: 518 mg/dL (ref 70–99)
Glucose, Bld: 149 mg/dL — ABNORMAL HIGH (ref 70–99)
Glucose, Bld: 166 mg/dL — ABNORMAL HIGH (ref 70–99)
Potassium: 4.4 mmol/L (ref 3.5–5.1)
Potassium: 3.9 mmol/L (ref 3.5–5.1)
Potassium: 3.5 mmol/L (ref 3.5–5.1)
Potassium: 3.2 mmol/L — ABNORMAL LOW (ref 3.5–5.1)
Sodium: 125 mmol/L — ABNORMAL LOW (ref 135–145)
Sodium: 131 mmol/L — ABNORMAL LOW (ref 135–145)
Sodium: 136 mmol/L (ref 135–145)
Sodium: 134 mmol/L — ABNORMAL LOW (ref 135–145)

## 2024-02-11 LAB — HEPATIC FUNCTION PANEL
ALT: 8 U/L (ref 0–44)
AST: 12 U/L — ABNORMAL LOW (ref 15–41)
Albumin: 3.2 g/dL — ABNORMAL LOW (ref 3.5–5.0)
Alkaline Phosphatase: 94 U/L (ref 38–126)
Bilirubin, Direct: <0.1 mg/dL (ref 0.0–0.2)
Total Bilirubin: <0.2 mg/dL (ref 0.0–1.2)
Total Protein: 6.1 g/dL — ABNORMAL LOW (ref 6.5–8.1)

## 2024-02-11 LAB — LIPASE, BLOOD: Lipase: 16 U/L (ref 11–51)

## 2024-02-11 LAB — BETA-HYDROXYBUTYRIC ACID
Beta-Hydroxybutyric Acid: 2.43 mmol/L — ABNORMAL HIGH (ref 0.05–0.27)
Beta-Hydroxybutyric Acid: 0.13 mmol/L (ref 0.05–0.27)
Beta-Hydroxybutyric Acid: 0.18 mmol/L (ref 0.05–0.27)

## 2024-02-11 LAB — CBG MONITORING, ED
Glucose-Capillary: 522 mg/dL (ref 70–99)
Glucose-Capillary: 424 mg/dL — ABNORMAL HIGH (ref 70–99)

## 2024-02-11 LAB — OSMOLALITY: Osmolality: 320 mosm/kg — ABNORMAL HIGH (ref 275–295)

## 2024-02-11 LAB — MRSA NEXT GEN BY PCR, NASAL: MRSA by PCR Next Gen: NOT DETECTED

## 2024-02-11 MED ORDER — SODIUM CHLORIDE 0.9% FLUSH
3.0000 mL | Freq: Two times a day (BID) | INTRAVENOUS | Status: DC
Start: 2024-02-11 — End: 2024-02-14
  Administered 2024-02-11 – 2024-02-12 (×4): 3 mL via INTRAVENOUS

## 2024-02-11 MED ORDER — ACETAMINOPHEN 650 MG RE SUPP: 650.0000 mg | Freq: Four times a day (QID) | RECTAL | Status: DC | PRN

## 2024-02-11 MED ORDER — ENOXAPARIN SODIUM 40 MG/0.4ML IJ SOSY
40.0000 mg | PREFILLED_SYRINGE | INTRAMUSCULAR | Status: DC
  Administered 2024-02-11 – 2024-02-13 (×3): 40 mg via SUBCUTANEOUS
  Filled 2024-02-11 (×3): qty 0.4

## 2024-02-11 MED ORDER — LORAZEPAM 2 MG/ML IJ SOLN
2.0000 mg | Freq: Three times a day (TID) | INTRAMUSCULAR | Status: DC | PRN
  Administered 2024-02-11 – 2024-02-12 (×3): 2 mg via INTRAMUSCULAR
  Filled 2024-02-11 (×3): qty 1

## 2024-02-11 MED ORDER — PROMETHAZINE (PHENERGAN) 6.25MG IN NS 50ML IVPB
6.2500 mg | Freq: Once | INTRAVENOUS | Status: AC
  Administered 2024-02-11: 6.25 mg via INTRAVENOUS
  Filled 2024-02-11: qty 6.25

## 2024-02-11 MED ORDER — SODIUM CHLORIDE 0.9 % IV SOLN
12.5000 mg | Freq: Four times a day (QID) | INTRAVENOUS | Status: DC | PRN
  Administered 2024-02-11 – 2024-02-13 (×5): 12.5 mg via INTRAVENOUS
  Filled 2024-02-11: qty 0.5
  Filled 2024-02-11 (×5): qty 12.5

## 2024-02-11 MED ORDER — OXYCODONE HCL 5 MG PO TABS
5.0000 mg | ORAL_TABLET | Freq: Four times a day (QID) | ORAL | Status: DC | PRN
  Administered 2024-02-11 – 2024-02-13 (×6): 10 mg via ORAL
  Filled 2024-02-11 (×7): qty 2
  Filled 2024-02-11: qty 1

## 2024-02-11 MED ORDER — KETOROLAC TROMETHAMINE 15 MG/ML IJ SOLN
7.5000 mg | Freq: Once | INTRAMUSCULAR | Status: AC
  Administered 2024-02-11: 7.5 mg via INTRAVENOUS
  Filled 2024-02-11: qty 1

## 2024-02-11 MED ORDER — INSULIN GLARGINE-YFGN 100 UNIT/ML ~~LOC~~ SOLN
15.0000 [IU] | Freq: Every day | SUBCUTANEOUS | Status: DC
  Filled 2024-02-11: qty 0.15

## 2024-02-11 MED ORDER — LACTATED RINGERS IV SOLN: INTRAVENOUS | Status: AC

## 2024-02-11 MED ORDER — PROMETHAZINE HCL 25 MG/ML IJ SOLN
INTRAMUSCULAR | Status: AC
  Filled 2024-02-11: qty 1

## 2024-02-11 MED ORDER — DIAZEPAM 5 MG PO TABS: 10.0000 mg | ORAL_TABLET | Freq: Two times a day (BID) | ORAL | Status: DC | PRN

## 2024-02-11 MED ORDER — ORAL CARE MOUTH RINSE
15.0000 mL | OROMUCOSAL | Status: DC | PRN
  Filled 2024-02-11: qty 15

## 2024-02-11 MED ORDER — ACETAMINOPHEN 325 MG PO TABS: 650.0000 mg | ORAL_TABLET | Freq: Four times a day (QID) | ORAL | Status: DC | PRN

## 2024-02-11 MED ORDER — POTASSIUM CHLORIDE CRYS ER 20 MEQ PO TBCR: 40.0000 meq | EXTENDED_RELEASE_TABLET | Freq: Once | ORAL | Status: DC

## 2024-02-11 MED ORDER — DIPHENHYDRAMINE HCL 25 MG PO CAPS
25.0000 mg | ORAL_CAPSULE | Freq: Four times a day (QID) | ORAL | Status: DC | PRN
Start: 2024-02-11 — End: 2024-02-14

## 2024-02-11 MED ORDER — HYDRALAZINE HCL 20 MG/ML IJ SOLN: 10.0000 mg | INTRAMUSCULAR | Status: DC | PRN

## 2024-02-11 MED ORDER — INSULIN ASPART 100 UNIT/ML IJ SOLN
0.0000 [IU] | Freq: Every day | INTRAMUSCULAR | Status: DC
  Administered 2024-02-11: 4 [IU] via SUBCUTANEOUS

## 2024-02-11 MED ORDER — PRAZOSIN HCL 1 MG PO CAPS: 1.0000 mg | ORAL_CAPSULE | Freq: Every evening | ORAL | Status: DC | PRN

## 2024-02-11 MED ORDER — INSULIN ASPART 100 UNIT/ML IJ SOLN
2.0000 [IU] | Freq: Three times a day (TID) | INTRAMUSCULAR | Status: DC
  Administered 2024-02-11: 2 [IU] via SUBCUTANEOUS

## 2024-02-11 MED ORDER — CITALOPRAM HYDROBROMIDE 20 MG PO TABS
40.0000 mg | ORAL_TABLET | Freq: Every day | ORAL | Status: DC
  Administered 2024-02-12 – 2024-02-13 (×2): 40 mg via ORAL
  Filled 2024-02-11 (×3): qty 2

## 2024-02-11 MED ORDER — LISDEXAMFETAMINE DIMESYLATE 30 MG PO CAPS
60.0000 mg | ORAL_CAPSULE | Freq: Every day | ORAL | Status: DC
Start: 2024-02-11 — End: 2024-02-11

## 2024-02-11 MED ORDER — DIPHENHYDRAMINE HCL 50 MG/ML IJ SOLN
12.5000 mg | Freq: Once | INTRAMUSCULAR | Status: AC
Start: 2024-02-11 — End: 2024-02-11
  Administered 2024-02-11: 12.5 mg via INTRAVENOUS
  Filled 2024-02-11: qty 1

## 2024-02-11 MED ORDER — KETOROLAC TROMETHAMINE 15 MG/ML IJ SOLN: 15.0000 mg | Freq: Three times a day (TID) | INTRAMUSCULAR | Status: DC | PRN

## 2024-02-11 MED ORDER — DEXTROSE IN LACTATED RINGERS 5 % IV SOLN: INTRAVENOUS | Status: AC

## 2024-02-11 MED ORDER — ORAL CARE MOUTH RINSE: 15.0000 mL | OROMUCOSAL | Status: DC | PRN

## 2024-02-11 MED ORDER — METOPROLOL TARTRATE 5 MG/5ML IV SOLN: 5.0000 mg | INTRAVENOUS | Status: DC | PRN

## 2024-02-11 MED ORDER — DIPHENHYDRAMINE HCL 25 MG PO CAPS: 25.0000 mg | ORAL_CAPSULE | Freq: Four times a day (QID) | ORAL | Status: DC | PRN

## 2024-02-11 MED ORDER — VERAPAMIL HCL 120 MG PO TABS
120.0000 mg | ORAL_TABLET | Freq: Three times a day (TID) | ORAL | Status: DC
  Administered 2024-02-11 – 2024-02-13 (×5): 120 mg via ORAL
  Filled 2024-02-11 (×9): qty 1

## 2024-02-11 MED ORDER — IPRATROPIUM-ALBUTEROL 0.5-2.5 (3) MG/3ML IN SOLN: 3.0000 mL | RESPIRATORY_TRACT | Status: DC | PRN

## 2024-02-11 MED ORDER — CHLORHEXIDINE GLUCONATE CLOTH 2 % EX PADS
6.0000 | MEDICATED_PAD | Freq: Every day | CUTANEOUS | Status: DC
  Administered 2024-02-11 – 2024-02-12 (×3): 6 via TOPICAL
  Filled 2024-02-11: qty 6

## 2024-02-11 MED ORDER — INSULIN GLARGINE-YFGN 100 UNIT/ML ~~LOC~~ SOLN
22.0000 [IU] | Freq: Every day | SUBCUTANEOUS | Status: DC
  Administered 2024-02-11: 22 [IU] via SUBCUTANEOUS
  Filled 2024-02-11 (×2): qty 0.22

## 2024-02-11 MED ORDER — INSULIN ASPART 100 UNIT/ML IJ SOLN
0.0000 [IU] | Freq: Three times a day (TID) | INTRAMUSCULAR | Status: DC
  Administered 2024-02-11: 2 [IU] via SUBCUTANEOUS
  Administered 2024-02-12: 3 [IU] via SUBCUTANEOUS
  Administered 2024-02-12: 15 [IU] via SUBCUTANEOUS
  Administered 2024-02-13: 8 [IU] via SUBCUTANEOUS

## 2024-02-11 MED ORDER — ORAL CARE MOUTH RINSE
15.0000 mL | OROMUCOSAL | Status: DC
  Administered 2024-02-11 – 2024-02-13 (×8): 15 mL via OROMUCOSAL

## 2024-02-11 MED ORDER — INSULIN REGULAR(HUMAN) IN NACL 100-0.9 UT/100ML-% IV SOLN
INTRAVENOUS | Status: DC
  Administered 2024-02-11: 10 [IU]/h via INTRAVENOUS
  Filled 2024-02-11: qty 100

## 2024-02-11 MED ORDER — PRAZOSIN HCL 1 MG PO CAPS
2.0000 mg | ORAL_CAPSULE | Freq: Every evening | ORAL | Status: DC | PRN
  Administered 2024-02-12: 2 mg via ORAL
  Filled 2024-02-11 (×2): qty 2

## 2024-02-11 MED ORDER — DEXTROSE 50 % IV SOLN: 0.0000 mL | INTRAVENOUS | Status: DC | PRN

## 2024-02-11 MED ORDER — POTASSIUM CHLORIDE 20 MEQ PO PACK
40.0000 meq | PACK | Freq: Once | ORAL | Status: AC
  Administered 2024-02-11: 40 meq via ORAL
  Filled 2024-02-11: qty 2

## 2024-02-11 MED ORDER — SENNOSIDES-DOCUSATE SODIUM 8.6-50 MG PO TABS
1.0000 | ORAL_TABLET | Freq: Every evening | ORAL | Status: DC | PRN
  Administered 2024-02-12: 1 via ORAL
  Filled 2024-02-11: qty 1

## 2024-02-11 NOTE — Hospital Course (Addendum)
  Brief Narrative:  33 year old with history of chronic pain, PTSD, depression, anxiety, chronic bilateral lower extremity weakness, chronic pruritus, DM1 admitted for general malaise weakness and hyperglycemia.  Upon admission found to be in diabetic ketoacidosis.   Assessment & Plan:  Principal Problem:   DKA (diabetic ketoacidosis) (HCC) Active Problems:   Depression with anxiety   Chronic back pain   PTSD (post-traumatic stress disorder)   DKA  History of diabetes mellitus type 1 - A1c was 13.2% in May 2205; she uses insulin  pump at home which was recently replaced  -Currently on DKA protocol.  Await anion gap closed and beta hydroxybutyrate to improve thereafter will transition to subcu insulin .  Diabetic coordinator consulted.   Depression, anxiety, PTSD  - Continue prazosin, Celexa, Vyvanse , Valium as-needed at home.  We do not have Vyvanse  on formulary at this time therefore requested patient to bring her own otherwise we will have to hold off on this. -Requested psychiatry evaluation   Dysphagia - Complaint.  Has seen by GI in the past and had a recent endoscopy.  No obvious abnormality found.  She has been recommending outpatient emptying study.  AVOID USE OF IV NARCOTICS without any clear indication   DVT prophylaxis: Lovenox     Code Status: Full Code Family Communication:   Status is: Inpatient Remains inpatient appropriate because: Continue hospital stay    Subjective: As soon as I walked in the room patient started asking for IV Dilaudid  or morphine  as she states she was not able to swallow.  She tells me she was at [facility] earlier this month but did not have any dysphagia evaluation.  I did check the records and she indeed had this evaluation which I explained to her.  She is not able to tell me why she is in DKA despite a very Dexcom and OmniPod's.  At this time I have been clear to her that I will not be giving her IV narcotics.  She has used  Benadryl  with Toradol  in the past.  She will be given p.o. Benadryl  prior to Toradol  if necessary.  There is no clear indication for any pain at this time.  She has gone through trauma workup recently.  Examination:  General exam: Appears calm and comfortable  Respiratory system: Clear to auscultation. Respiratory effort normal. Cardiovascular system: S1 & S2 heard, RRR. No JVD, murmurs, rubs, gallops or clicks. No pedal edema. Gastrointestinal system: Abdomen is nondistended, soft and nontender. No organomegaly or masses felt. Normal bowel sounds heard. Central nervous system: Alert and oriented. No focal neurological deficits. Extremities: Symmetric 5 x 5 power. Skin: No rashes, lesions or ulcers Psychiatry: Judgement and insight appear normal. Mood & affect appropriate.

## 2024-02-11 NOTE — ED Notes (Signed)
 Pt leaving with [facility] at this time. No acute distress noted upon departure. All paperwork sent with [facility].

## 2024-02-11 NOTE — Plan of Care (Addendum)
 [facility] progressive unit transfer: 33 year old female history of DM type I on insulin  pump, pelvic fracture status post postlaminectomy, GERD with chronic dysphagia, chronic diarrhea, chronic bilateral lower extremity weakness wheelchair and walker use and chronic pain syndrome presented to emergency department complaining of high blood glucose level on Dexcom reading and patient thinks she has DKA.  Patient also complaining about generalized body, vomiting and feeling unwell.  At presentation to ED patient found tachycardic heart rate 123 improved to 110.  Blood pressure 94/74 improved to 105/96. CBG showed blood glucose above 600. BMP showed elevated blood glucose 709, corrected sodium 135, low bicarb 18 elevated anion gap 23 and elevated creatinine 1.18. VBG showing pH 7.3, pCO2 38, bicarb 21. Pending beta-hydroxybutyrate level and UA. Most recent blood glucose is 522. Pending UA, pregnancy test. CBC unremarkable.  In the ED patient has been given 2 L of NS bolus and currently on insulin  drip with DKA protocol.  Hospitalist has been consulted for management of DKA and AKI.  Of note, patient was just discharged from the hospital 5/20 for DKA.  Also patient has pain medication seeking behavior and she has a long list of medication with allergy.

## 2024-02-11 NOTE — Progress Notes (Signed)
 PROGRESS NOTE    Julie [name]  ZOX:[id number] DOB: [birth year] DOA: 02/10/2024 PCP: Climmie Damme, PA-C     Brief Narrative:  33 year old with history of chronic pain, PTSD, depression, anxiety, chronic bilateral lower extremity weakness, chronic pruritus, DM1 admitted for general malaise weakness and hyperglycemia.  Upon admission found to be in diabetic ketoacidosis.   Assessment & Plan:  Principal Problem:   DKA (diabetic ketoacidosis) (HCC) Active Problems:   Depression with anxiety   Chronic back pain   PTSD (post-traumatic stress disorder)   DKA  History of diabetes mellitus type 1 - A1c was 13.2% in May 2205; she uses insulin  pump at home which was recently replaced  -Currently on DKA protocol.  Await anion gap closed and beta hydroxybutyrate to improve thereafter will transition to subcu insulin .  Diabetic coordinator consulted.   Depression, anxiety, PTSD  - Continue prazosin, Celexa, Vyvanse , Valium as-needed at home.  We do not have Vyvanse  on formulary at this time therefore requested patient to bring her own otherwise we will have to hold off on this. -Requested psychiatry evaluation   Dysphagia - Complaint.  Has seen by GI in the past and had a recent endoscopy.  No obvious abnormality found.  She has been recommending outpatient emptying study.  AVOID USE OF IV NARCOTICS without any clear indication   DVT prophylaxis: Lovenox     Code Status: Full Code Family Communication:   Status is: Inpatient Remains inpatient appropriate because: Continue hospital stay    Subjective: As soon as I walked in the room patient started asking for IV Dilaudid  or morphine  as she states she was not able to swallow.  She tells me she was at [facility] earlier this month but did not have any dysphagia evaluation.  I did check the records and she indeed had this evaluation which I explained to her.  She is not able to tell me why she is in DKA despite a very  Dexcom and OmniPod's.  At this time I have been clear to her that I will not be giving her IV narcotics.  She has used Benadryl  with Toradol  in the past.  She will be given p.o. Benadryl  prior to Toradol  if necessary.  There is no clear indication for any pain at this time.  She has gone through trauma workup recently.  Examination:  General exam: Appears calm and comfortable  Respiratory system: Clear to auscultation. Respiratory effort normal. Cardiovascular system: S1 & S2 heard, RRR. No JVD, murmurs, rubs, gallops or clicks. No pedal edema. Gastrointestinal system: Abdomen is nondistended, soft and nontender. No organomegaly or masses felt. Normal bowel sounds heard. Central nervous system: Alert and oriented. No focal neurological deficits. Extremities: Symmetric 5 x 5 power. Skin: No rashes, lesions or ulcers Psychiatry: Judgement and insight appear normal. Mood & affect appropriate.                Diet Orders (From admission, onward)     Start     Ordered   02/11/24 1112  Diet Carb Modified Fluid consistency: Thin; Room service appropriate? Yes  Diet effective now       Question Answer Comment  Diet-HS Snack? Nothing   Calorie Level Medium 1600-2000   Fluid consistency: Thin   Room service appropriate? Yes      02/11/24 1111            Objective: Vitals:   02/11/24 1300 02/11/24 1400 02/11/24 1500 02/11/24 1522  BP: 110/62 104/64 108/73 108/73  Pulse: 81 86 74   Resp: 14 18 11    Temp:      TempSrc:      SpO2: 94% (!) 89% 93%   Weight:      Height:        Intake/Output Summary (Last 24 hours) at 02/11/2024 1539 Last data filed at 02/11/2024 1525 Gross per 24 hour  Intake 3686.57 ml  Output 1325 ml  Net 2361.57 ml   Filed Weights   02/11/24 0925  Weight: 86.5 kg    Scheduled Meds:  Chlorhexidine  Gluconate Cloth  6 each Topical Daily   citalopram  40 mg Oral Daily   enoxaparin  (LOVENOX ) injection  40 mg Subcutaneous Q24H   insulin  aspart   0-15 Units Subcutaneous TID WC   insulin  aspart  0-5 Units Subcutaneous QHS   insulin  aspart  2 Units Subcutaneous TID WC   insulin  glargine-yfgn  22 Units Subcutaneous Daily   mouth rinse  15 mL Mouth Rinse 4 times per day   sodium chloride  flush  3 mL Intravenous Q12H   verapamil  120 mg Oral TID   Continuous Infusions:  dextrose  5% lactated ringers  Stopped (02/11/24 1510)   insulin  Stopped (02/11/24 1508)   lactated ringers  Stopped (02/11/24 0641)   promethazine  (PHENERGAN ) injection (IM or IVPB) Stopped (02/11/24 1436)    Nutritional status     Body mass index is 26.6 kg/m.  Data Reviewed:   CBC: Recent Labs  Lab 02/06/24 1611 02/07/24 0020 02/10/24 2325 02/11/24 0010  WBC 8.2 8.0 7.0  --   NEUTROABS 5.5  --   --   --   HGB 12.5 11.5* 12.9 12.2  HCT 37.8 35.8* 37.6 36.0  MCV 83.6 88.0 80.0  --   PLT 197 185 211  --    Basic Metabolic Panel: Recent Labs  Lab 02/07/24 1449 02/10/24 2325 02/11/24 0010 02/11/24 0238 02/11/24 0910 02/11/24 1311  NA 138 125* 122* 131* 136 134*  K 4.3 4.4 5.0 3.9 3.5 3.2*  CL 106 83*  --  94* 103 101  CO2 24 18*  --  21* 25 26  GLUCOSE 241* 709*  --  518* 149* 166*  BUN 14 26*  --  22* 22* 23*  CREATININE 0.55 1.18*  --  0.96 0.75 0.78  CALCIUM 8.6* 8.8*  --  7.9* 8.3* 8.2*   GFR: Estimated Creatinine Clearance: 121.7 mL/min (by C-G formula based on SCr of 0.78 mg/dL). Liver Function Tests: Recent Labs  Lab 02/06/24 1611 02/11/24 0238  AST 17 12*  ALT 10 8  ALKPHOS 106 94  BILITOT <0.2 <0.2  PROT 7.0 6.1*  ALBUMIN 3.7 3.2*   Recent Labs  Lab 02/06/24 1611 02/11/24 0238  LIPASE 37 16   No results for input(s): "AMMONIA" in the last 168 hours. Coagulation Profile: No results for input(s): "INR", "PROTIME" in the last 168 hours. Cardiac Enzymes: No results for input(s): "CKTOTAL", "CKMB", "CKMBINDEX", "TROPONINI" in the last 168 hours. BNP (last 3 results) No results for input(s): "PROBNP" in the last 8760  hours. HbA1C: No results for input(s): "HGBA1C" in the last 72 hours. CBG: Recent Labs  Lab 02/11/24 0836 02/11/24 1003 02/11/24 1101 02/11/24 1205 02/11/24 1309  GLUCAP 170* 168* 169* 173* 164*   Lipid Profile: No results for input(s): "CHOL", "HDL", "LDLCALC", "TRIG", "CHOLHDL", "LDLDIRECT" in the last 72 hours. Thyroid Function Tests: No results for input(s): "TSH", "T4TOTAL", "FREET4", "T3FREE", "THYROIDAB" in the last 72 hours. Anemia Panel: No results for input(s): "VITAMINB12", "  FOLATE", "FERRITIN", "TIBC", "IRON", "RETICCTPCT" in the last 72 hours. Sepsis Labs: No results for input(s): "PROCALCITON", "LATICACIDVEN" in the last 168 hours.  Recent Results (from the past 240 hours)  MRSA Next Gen by PCR, Nasal     Status: None   Collection Time: 02/06/24  9:42 PM   Specimen: Nasal Mucosa; Nasal Swab  Result Value Ref Range Status   MRSA by PCR Next Gen NOT DETECTED NOT DETECTED Final    Comment: (NOTE) The GeneXpert MRSA Assay (FDA approved for NASAL specimens only), is one component of a comprehensive MRSA colonization surveillance program. It is not intended to diagnose MRSA infection nor to guide or monitor treatment for MRSA infections. Test performance is not FDA approved in patients less than 92 years old. Performed at [facility], [address], [place], [place] [address]   MRSA Next Gen by PCR, Nasal     Status: None   Collection Time: 02/11/24  6:58 AM   Specimen: Nasal Mucosa; Nasal Swab  Result Value Ref Range Status   MRSA by PCR Next Gen NOT DETECTED NOT DETECTED Final    Comment: (NOTE) The GeneXpert MRSA Assay (FDA approved for NASAL specimens only), is one component of a comprehensive MRSA colonization surveillance program. It is not intended to diagnose MRSA infection nor to guide or monitor treatment for MRSA infections. Test performance is not FDA approved in patients less than 76 years old. Performed at [facility], [address], [place], [place] [address]          Radiology Studies: No results found.         LOS: 0 days   Time spent= 35 mins    Maggie Schooner, MD [facility]  If 7PM-7AM, please contact night-coverage  02/11/2024, 3:39 PM

## 2024-02-11 NOTE — ED Notes (Signed)
 Patient continues to request pain medication and more nausea medication.  Was offered PO pain medications by MD and patient refused.  States she is allergic to all nausea medications other than Phenergan .

## 2024-02-11 NOTE — ED Notes (Signed)
 Attempts x 2 made to start IV.  Unsuccessful at this time.

## 2024-02-11 NOTE — H&P (Signed)
 History and Physical    Julie [name] NGE:[id number] DOB: 18-May-1991 DOA: 02/10/2024  PCP: Climmie Damme, PA-C   Patient coming from: Home   Chief Complaint: "I think I'm in DKA"   HPI: Julie [name] is a 33 y.o. female with medical history significant for chronic pain, PTSD, depression, anxiety, chronic bilateral lower extremity weakness, chronic pruritus, chronic nausea, and type 1 diabetes mellitus who presents with general malaise, nausea, vomiting, and elevated blood glucose.  Patient complains of general malaise, abdominal discomfort, and nausea similar to when she has had DKA in the past. She also reported dysuria.   She believes that her insulin  pump is functioning appropriately, states that she received a new earlier this month, and does not know why she is in DKA again.   [facility] ED Course: Upon arrival to the ED, patient is found to be afebrile and saturating mid 90s on room air with elevated heart rate and stable blood pressure.  Labs are most notable for glucose 709, bicarbonate 18, anion gap 22, and normal WBC.  Patient was treated with 2 L NS and Benadryl , started on insulin  infusion, and transferred was a long hospital for admission.  Review of Systems:  All other systems reviewed and apart from HPI, are negative.  Past Medical History:  Diagnosis Date   Bulimia nervosa    Dental caries    Diabetes mellitus without complication (HCC)    Genital warts    Noncompliance with diabetes treatment    Pelvic fracture [facility])     Past Surgical History:  Procedure Laterality Date   BIOPSY  05/30/2021   Procedure: BIOPSY;  Surgeon: Lajuan Pila, MD;  Location: [facility] ENDOSCOPY;  Service: Endoscopy;;   CESAREAN SECTION     CESAREAN SECTION  2012   ESOPHAGOGASTRODUODENOSCOPY (EGD) WITH PROPOFOL  N/A 05/30/2021   Procedure: ESOPHAGOGASTRODUODENOSCOPY (EGD) WITH PROPOFOL ;  Surgeon: Lajuan Pila, MD;  Location: [facility] ENDOSCOPY;  Service: Endoscopy;  Laterality: N/A;     Social History:   reports that she has quit smoking. Her smoking use included cigarettes. She has never used smokeless tobacco. She reports that she does not currently use drugs. She reports that she does not drink alcohol.  Allergies  Allergen Reactions   Chloroprocaine Anaphylaxis   Iodinated Contrast Media Itching   Lidocaine  Anaphylaxis   Olanzapine Itching, Nausea And Vomiting, Nausea Only and Other (See Comments)   Ondansetron  Hcl Itching   Penicillins Itching    Has tolerated zosyn   Prochlorperazine Anaphylaxis, Swelling and Anxiety   Sulfa  Antibiotics Hives   Toradol  [Ketorolac  Tromethamine ] Hives   Metoclopramide  Other (See Comments)    Cramps     Tramadol Nausea And Vomiting   Celebrex [Celecoxib] Diarrhea   Hydroxyzine  Other (See Comments)    Makes her more anxious     Family History  Problem Relation Age of Onset   Diabetes Mother      Prior to Admission medications   Medication Sig Start Date End Date Taking? Authorizing Provider  amphetamine-dextroamphetamine (ADDERALL) 20 MG tablet Take 20 mg by mouth daily. 01/17/24   [provider]  citalopram (CELEXA) 40 MG tablet Take 40 mg by mouth daily. 12/26/23   [provider]  Continuous Glucose Sensor (DEXCOM G6 SENSOR) MISC Place 1 each onto the skin See admin instructions. Change sensor every 10 days 12/13/23 12/12/24  [provider]  cyclobenzaprine  (FLEXERIL ) 10 MG tablet Take 10 mg by mouth 3 (three) times daily as needed for muscle spasms.    [provider]  diazepam (VALIUM) 10 MG tablet Take 10 mg by mouth every 12 (twelve) hours as needed for anxiety. 08/31/23   [provider]  insulin  aspart (NOVOLOG  FLEXPEN) 100 UNIT/ML FlexPen Inject 12 Units into the skin 3 (three) times daily with meals. Patient taking differently: Inject 1-16 Units into the skin 3 (three) times daily with meals. 04/10/17   Mozell Arias, MD  Insulin  Disposable Pump (OMNIPOD 5  DEXG7G6 PODS GEN 5) MISC Inject 1 Device into the skin every 3 (three) days.    [provider]  linaclotide Glory Larsen) 145 MCG CAPS capsule Take 145 mcg by mouth daily before breakfast. 12/26/23   [provider]  lisdexamfetamine (VYVANSE ) 60 MG capsule Take 60 mg by mouth daily. 05/24/21   [provider]  prazosin (MINIPRESS) 1 MG capsule Take 1 mg by mouth at bedtime as needed (sleep). 11/18/22 05/04/24  [provider]  pregabalin (LYRICA) 200 MG capsule Take 200 mg by mouth 3 (three) times daily.    [provider]  promethazine  (PHENERGAN ) 12.5 MG tablet Take 12.5 mg by mouth every 12 (twelve) hours as needed for nausea, vomiting or refractory nausea / vomiting.    [provider]  promethazine  (PHENERGAN ) 25 MG suppository Place 1 suppository (25 mg total) rectally every 6 (six) hours as needed for nausea or vomiting. 01/10/24   Bauer, Collin S, PA-C  SUMAtriptan (IMITREX) 100 MG tablet Take 100 mg by mouth every 2 (two) hours as needed for migraine or headache. MAX 2 doses (200mg ) in 24 hours 02/04/24 05/04/24  [provider]  verapamil (CALAN) 120 MG tablet Take 120 mg by mouth 3 (three) times daily. 12/26/23   [provider]    Physical Exam: Vitals:   02/11/24 0315 02/11/24 0330 02/11/24 0333 02/11/24 0343  BP: (!) 118/55 119/78    Pulse: 84 (!) 106  93  Resp:      Temp:   (!) 97.5 F (36.4 C)   TempSrc:   Oral   SpO2: 99% 98%  99%    Constitutional: NAD, no pallor or diaphoresis   Eyes: PERTLA, lids and conjunctivae normal ENMT: Mucous membranes are moist. Posterior pharynx clear of any exudate or lesions.   Neck: supple, no masses  Respiratory: no wheezing, no crackles. No accessory muscle use.  Cardiovascular: S1 & S2 heard, regular rate and rhythm. No extremity edema.  Abdomen: Soft, no guarding. Bowel sounds active.  Musculoskeletal: no clubbing / cyanosis. No joint deformity upper and lower extremities.    Skin: no significant rashes, lesions, ulcers. Warm, dry, well-perfused. Neurologic: CN 2-12 grossly intact. Moving all extremities. Alert and oriented.  Psychiatric: Calm. Cooperative.    Labs and Imaging on Admission: I have personally reviewed following labs and imaging studies  CBC: Recent Labs  Lab 02/06/24 1611 02/07/24 0020 02/10/24 2325 02/11/24 0010  WBC 8.2 8.0 7.0  --   NEUTROABS 5.5  --   --   --   HGB 12.5 11.5* 12.9 12.2  HCT 37.8 35.8* 37.6 36.0  MCV 83.6 88.0 80.0  --   PLT 197 185 211  --    Basic Metabolic Panel: Recent Labs  Lab 02/07/24 0631 02/07/24 1127 02/07/24 1449 02/10/24 2325 02/11/24 0010 02/11/24 0238  NA 137 135 138 125* 122* 131*  K 3.8 4.3 4.3 4.4 5.0 3.9  CL 110 107 106 83*  --  94*  CO2 21* 21* 24 18*  --  21*  GLUCOSE 187* 175* 241*  709*  --  518*  BUN 18 15 14  26*  --  22*  CREATININE 0.54 0.64 0.55 1.18*  --  0.96  CALCIUM 8.0* 8.3* 8.6* 8.8*  --  7.9*   GFR: Estimated Creatinine Clearance: 101.3 mL/min (by C-G formula based on SCr of 0.96 mg/dL). Liver Function Tests: Recent Labs  Lab 02/06/24 1611 02/11/24 0238  AST 17 12*  ALT 10 8  ALKPHOS 106 94  BILITOT <0.2 <0.2  PROT 7.0 6.1*  ALBUMIN 3.7 3.2*   Recent Labs  Lab 02/06/24 1611 02/11/24 0238  LIPASE 37 16   No results for input(s): "AMMONIA" in the last 168 hours. Coagulation Profile: No results for input(s): "INR", "PROTIME" in the last 168 hours. Cardiac Enzymes: No results for input(s): "CKTOTAL", "CKMB", "CKMBINDEX", "TROPONINI" in the last 168 hours. BNP (last 3 results) No results for input(s): "PROBNP" in the last 8760 hours. HbA1C: No results for input(s): "HGBA1C" in the last 72 hours. CBG: Recent Labs  Lab 02/07/24 1542 02/10/24 2321 02/11/24 0220 02/11/24 0316 02/11/24 0435  GLUCAP 290* >600* 522* 424* 328*   Lipid Profile: No results for input(s): "CHOL", "HDL", "LDLCALC", "TRIG", "CHOLHDL", "LDLDIRECT" in the last 72 hours. Thyroid  Function Tests: No results for input(s): "TSH", "T4TOTAL", "FREET4", "T3FREE", "THYROIDAB" in the last 72 hours. Anemia Panel: No results for input(s): "VITAMINB12", "FOLATE", "FERRITIN", "TIBC", "IRON", "RETICCTPCT" in the last 72 hours. Urine analysis:    Component Value Date/Time   COLORURINE YELLOW 02/06/2024 1611   APPEARANCEUR CLEAR 02/06/2024 1611   LABSPEC 1.010 02/06/2024 1611   PHURINE 5.5 02/06/2024 1611   GLUCOSEU >=500 (A) 02/06/2024 1611   HGBUR NEGATIVE 02/06/2024 1611   BILIRUBINUR NEGATIVE 02/06/2024 1611   KETONESUR NEGATIVE 02/06/2024 1611   PROTEINUR NEGATIVE 02/06/2024 1611   UROBILINOGEN 0.2 02/04/2015 1125   NITRITE NEGATIVE 02/06/2024 1611   LEUKOCYTESUR NEGATIVE 02/06/2024 1611   Sepsis Labs: @LABRCNTIP (procalcitonin:4,lacticidven:4) ) Recent Results (from the past 240 hours)  MRSA Next Gen by PCR, Nasal     Status: None   Collection Time: 02/06/24  9:42 PM   Specimen: Nasal Mucosa; Nasal Swab  Result Value Ref Range Status   MRSA by PCR Next Gen NOT DETECTED NOT DETECTED Final    Comment: (NOTE) The GeneXpert MRSA Assay (FDA approved for NASAL specimens only), is one component of a comprehensive MRSA colonization surveillance program. It is not intended to diagnose MRSA infection nor to guide or monitor treatment for MRSA infections. Test performance is not FDA approved in patients less than 11 years old. Performed at [facility], [address] [place], [place], [place] [address]      Radiological Exams on Admission: No results found.  Assessment/Plan   1. DKA  - A1c was 13.2% in May 2205; she uses insulin  pump at home which was recently replaced  - Continue insulin  infusion and IV fluid hydration with frequent CBGs and serial chemistry panels until DKA resolved, consult diabetes educator   2. Depression, anxiety, PTSD  - Continue prazosin, Celexa, Vyvanse , Valium as-needed   3. Dysuria  - Not septic on admission, no fever or  leukocytosis  - Check UA    DVT prophylaxis: Lovenox   Code Status: Full  Level of Care: Level of care: Stepdown Family Communication: none present  Disposition Plan:  Patient is from: home  Anticipated d/c is to: home  Anticipated d/c date is: 5/25 or 02/13/24  Patient currently: Pending glycemic-control  Consults called: none  Admission status: inpatient  Walton Guppy, MD [facility]  02/11/2024, 5:24 AM

## 2024-02-11 NOTE — ED Notes (Signed)
 Patient reports she is still in pain.  States pain is in her legs, arms, and on her R flank that "radiates around."  C/o HA.  Identifies that pain is related to her DKA and "not chronic pain."

## 2024-02-11 NOTE — Inpatient Diabetes Management (Signed)
 Inpatient Diabetes Program Recommendations  AACE/ADA: New Consensus Statement on Inpatient Glycemic Control (2015)  Target Ranges:  Prepandial:   less than 140 mg/dL      Peak postprandial:   less than 180 mg/dL (1-2 hours)      Critically ill patients:  140 - 180 mg/dL   Lab Results  Component Value Date   GLUCAP 168 (H) 02/11/2024   HGBA1C 13.2 (H) 02/07/2024    Review of Glycemic Control  Diabetes history: DM1 Outpatient Diabetes medications: OmniPod Current orders for Inpatient glycemic control: IV insulin    OmniPod settings basal rate 1.2 units/h, carb ratio 1 unit per 8 g Sensitivity 1 unit per 30 mg/dL Active insulin  time 4 hours BG target 120, with correction above 150 Advised to bolus before each meal or 2 hours after meals for BG's above 180    HgbA1C - 13.2%     Inpatient Diabetes Program Recommendations:    Noted consult. Patient jsut spoke with Sallyanne Creamer DM coordinator 02/07/24. Multiple ED visits/admissions for hyperglycemia/DKA with multiple reasons for inability to see outpatient endocrinology.  Last OP visit with PCP for diabetes was 08/2023.  Feel it may benefit patient to go back on injections until outpatient follow up given use of mother's insulin  pump, narcotic seeking behavior, social issues etc.   When ready to transition off IV insulin  consider: -Semglee  22 units two hours prior to discontinuation of IV insulin , then every day to follow -Novolog  5 units TID (assuming patient consuming meals) -Novolog  0-6 units TID & HS  Thanks, Marjo Sievert, MSN, RNC-OB Diabetes Coordinator [phone] (8a-5p)

## 2024-02-11 NOTE — ED Notes (Signed)
 Patient transported to [facility] at this time via [employer] [facility]

## 2024-02-11 NOTE — ED Notes (Signed)
 [facility] called for transport.

## 2024-02-11 NOTE — ED Notes (Signed)
 Patient has all personal belongings.  Will be transporting them with her to [facility].  No personal belongings kept by staff
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
## 2024-02-12 DIAGNOSIS — E0810 Diabetes mellitus due to underlying condition with ketoacidosis without coma: Secondary | ICD-10-CM | POA: Diagnosis not present

## 2024-02-12 DIAGNOSIS — F411 Generalized anxiety disorder: Secondary | ICD-10-CM

## 2024-02-12 LAB — GLUCOSE, CAPILLARY
Glucose-Capillary: 457 mg/dL — ABNORMAL HIGH (ref 70–99)
Glucose-Capillary: 244 mg/dL — ABNORMAL HIGH (ref 70–99)
Glucose-Capillary: 198 mg/dL — ABNORMAL HIGH (ref 70–99)
Glucose-Capillary: 108 mg/dL — ABNORMAL HIGH (ref 70–99)
Glucose-Capillary: 163 mg/dL — ABNORMAL HIGH (ref 70–99)

## 2024-02-12 LAB — CBC
HCT: 40.9 % (ref 36.0–46.0)
Hemoglobin: 13 g/dL (ref 12.0–15.0)
MCH: 27.5 pg (ref 26.0–34.0)
MCHC: 31.8 g/dL (ref 30.0–36.0)
MCV: 86.7 fL (ref 80.0–100.0)
Platelets: 182 10*3/uL (ref 150–400)
RBC: 4.72 MIL/uL (ref 3.87–5.11)
RDW: 13.2 % (ref 11.5–15.5)
WBC: 6.1 10*3/uL (ref 4.0–10.5)
nRBC: 0 % (ref 0.0–0.2)

## 2024-02-12 LAB — BASIC METABOLIC PANEL WITH GFR
Anion gap: 10 (ref 5–15)
BUN: 16 mg/dL (ref 6–20)
CO2: 21 mmol/L — ABNORMAL LOW (ref 22–32)
Calcium: 8.5 mg/dL — ABNORMAL LOW (ref 8.9–10.3)
Chloride: 103 mmol/L (ref 98–111)
Creatinine, Ser: 0.61 mg/dL (ref 0.44–1.00)
GFR, Estimated: >60 mL/min (ref >60)
Glucose, Bld: 356 mg/dL — ABNORMAL HIGH (ref 70–99)
Potassium: 4.2 mmol/L (ref 3.5–5.1)
Sodium: 134 mmol/L — ABNORMAL LOW (ref 135–145)

## 2024-02-12 LAB — HIV ANTIBODY (ROUTINE TESTING W REFLEX): HIV Screen 4th Generation wRfx: NONREACTIVE

## 2024-02-12 LAB — MAGNESIUM: Magnesium: 2.2 mg/dL (ref 1.7–2.4)

## 2024-02-12 MED ORDER — INSULIN ASPART 100 UNIT/ML IJ SOLN
5.0000 [IU] | Freq: Three times a day (TID) | INTRAMUSCULAR | Status: DC
  Administered 2024-02-12 – 2024-02-13 (×4): 5 [IU] via SUBCUTANEOUS

## 2024-02-12 MED ORDER — LORAZEPAM 2 MG/ML IJ SOLN
2.0000 mg | Freq: Two times a day (BID) | INTRAMUSCULAR | Status: DC
  Filled 2024-02-12: qty 1

## 2024-02-12 MED ORDER — LORAZEPAM 1 MG PO TABS
1.0000 mg | ORAL_TABLET | Freq: Two times a day (BID) | ORAL | Status: DC | PRN
  Administered 2024-02-12 – 2024-02-13 (×2): 1 mg via ORAL
  Filled 2024-02-12 (×2): qty 1

## 2024-02-12 MED ORDER — SUMATRIPTAN SUCCINATE 50 MG PO TABS: 100.0000 mg | ORAL_TABLET | ORAL | Status: DC | PRN

## 2024-02-12 MED ORDER — INSULIN GLARGINE-YFGN 100 UNIT/ML ~~LOC~~ SOLN
30.0000 [IU] | Freq: Every day | SUBCUTANEOUS | Status: DC
  Administered 2024-02-12: 30 [IU] via SUBCUTANEOUS
  Filled 2024-02-12 (×2): qty 0.3

## 2024-02-12 MED ORDER — LORAZEPAM 2 MG/ML PO CONC: 1.0000 mg | Freq: Two times a day (BID) | ORAL | Status: DC | PRN

## 2024-02-12 MED ORDER — LINACLOTIDE 145 MCG PO CAPS
145.0000 ug | ORAL_CAPSULE | Freq: Every day | ORAL | Status: DC
  Filled 2024-02-12 (×2): qty 1

## 2024-02-12 MED ORDER — PREGABALIN 75 MG PO CAPS
200.0000 mg | ORAL_CAPSULE | Freq: Three times a day (TID) | ORAL | Status: DC
  Administered 2024-02-12 – 2024-02-13 (×3): 200 mg via ORAL
  Filled 2024-02-12 (×3): qty 2

## 2024-02-12 NOTE — Discharge Instructions (Signed)
 Julie [name] [facility] [address]  [phone]

## 2024-02-12 NOTE — Plan of Care (Signed)

## 2024-02-12 NOTE — Progress Notes (Signed)
 OT Cancellation Note  Patient Details Name: Julie [name] MRN: [id number] DOB: 15-Jul-1991   Cancelled Treatment:    Reason Eval/Treat Not Completed: Fatigue/lethargy limiting ability to participate Patient is in bed sleeping at this time. OT to continue to follow and check back on 5/26.  Wynette Heckler, MS Acute Rehabilitation Department Office# [phone] 02/12/2024, 2:30 PM

## 2024-02-12 NOTE — Inpatient Diabetes Management (Signed)
 Inpatient Diabetes Program Recommendations  AACE/ADA: New Consensus Statement on Inpatient Glycemic Control (2015)  Target Ranges:  Prepandial:   less than 140 mg/dL      Peak postprandial:   less than 180 mg/dL (1-2 hours)      Critically ill patients:  140 - 180 mg/dL   Lab Results  Component Value Date   GLUCAP 457 (H) 02/12/2024   HGBA1C 13.2 (H) 02/07/2024    Review of Glycemic Control  Diabetes history: DM1 Outpatient Diabetes medications: OmniPod Current orders for Inpatient glycemic control: Semglee  25 units every day, Novolog  5 units TID, Novolog  0-15 units TID & HS   OmniPod settings basal rate 1.2 units/h, carb ratio 1 unit per 8 g Sensitivity 1 unit per 30 mg/dL Active insulin  time 4 hours BG target 120, with correction above 150 Advised to bolus before each meal or 2 hours after meals for BG's above 180    HgbA1C - 13.2%       Inpatient Diabetes Program Recommendations:     Noted hyperglycemia this AM of >400 mg/dL. Secure chat sent to RN. Patient had snacks removed from bedside and when asked the patient does not understand why her blood sugars are so high.  Attempted to call x 2. No answer.  In agreement with current orders, as it looks like patient also missed meal coverage yesterday with dinner.   Thanks, Marjo Sievert, MSN, RNC-OB Diabetes Coordinator [phone] (8a-5p)

## 2024-02-12 NOTE — Progress Notes (Signed)
 PROGRESS NOTE    Julie [name]  NWG:[id number] DOB: [birth year] DOA: 02/10/2024 PCP: Climmie Damme, PA-C     Brief Narrative:  33 year old with history of chronic pain, PTSD, depression, anxiety, chronic bilateral lower extremity weakness, chronic pruritus, DM1 admitted for general malaise weakness and hyperglycemia.  Upon admission found to be in diabetic ketoacidosis.   Assessment & Plan:  Principal Problem:   DKA (diabetic ketoacidosis) (HCC) Active Problems:   Depression with anxiety   Chronic back pain   PTSD (post-traumatic stress disorder)   DKA  History of diabetes mellitus type 1 - A1c was 13.2% in May 2205; she uses insulin  pump at home which was recently replaced  - Off Endo tool, transition to subcu insulin .  Advised not to start her insulin  pump without notifying the staff   Depression, anxiety, PTSD  - Seen by psychiatry, medications adjusted   Dysphagia - Complaint.  Has seen by GI in the past and had a recent endoscopy.  No obvious abnormality found.  She has been recommending outpatient emptying study.  AVOID USE OF IV NARCOTICS without any clear indication   DVT prophylaxis: Lovenox     Code Status: Full Code Family Communication:   Status is: Inpatient Remains inpatient appropriate because: Continue hospital stay    Subjective: Snacking between meals driving hyperglycemia but then when I saw her she complains of dysphagia.  Continues to ask for Ativan .  I explained to her there is no indication and would like any additional dose if not medically indicated. Examination:  General exam: Appears calm and comfortable  Respiratory system: Clear to auscultation. Respiratory effort normal. Cardiovascular system: S1 & S2 heard, RRR. No JVD, murmurs, rubs, gallops or clicks. No pedal edema. Gastrointestinal system: Abdomen is nondistended, soft and nontender. No organomegaly or masses felt. Normal bowel sounds heard. Central nervous system: Alert  and oriented. No focal neurological deficits. Extremities: Symmetric 5 x 5 power. Skin: No rashes, lesions or ulcers Psychiatry: Judgement and insight appear normal. Mood & affect appropriate.                Diet Orders (From admission, onward)     Start     Ordered   02/12/24 0752  Diet Carb Modified Fluid consistency: Thin; Room service appropriate? Yes  Diet effective now       Question Answer Comment  Diet-HS Snack? Nothing   Calorie Level Medium 1600-2000   Fluid consistency: Thin   Room service appropriate? Yes      02/12/24 0752            Objective: Vitals:   02/12/24 0500 02/12/24 0600 02/12/24 0700 02/12/24 0800  BP:  101/67 (!) 90/48   Pulse: (!) 112 99 74   Resp: 18 (!) 22 15   Temp:  97.6 F (36.4 C)  97.9 F (36.6 C)  TempSrc:  Oral  Oral  SpO2: (!) 88% 91% (!) 87%   Weight:      Height:        Intake/Output Summary (Last 24 hours) at 02/12/2024 1009 Last data filed at 02/12/2024 0630 Gross per 24 hour  Intake 1841.89 ml  Output 2625 ml  Net -783.11 ml   Filed Weights   02/11/24 0925 02/12/24 0000  Weight: 86.5 kg 85.9 kg    Scheduled Meds:  Chlorhexidine  Gluconate Cloth  6 each Topical Daily   citalopram  40 mg Oral Daily   enoxaparin  (LOVENOX ) injection  40 mg Subcutaneous Q24H   insulin  aspart  0-15  Units Subcutaneous TID WC   insulin  aspart  0-5 Units Subcutaneous QHS   insulin  aspart  5 Units Subcutaneous TID WC   insulin  glargine-yfgn  30 Units Subcutaneous Daily   linaclotide  145 mcg Oral QAC breakfast   LORazepam   2 mg Intramuscular BID   mouth rinse  15 mL Mouth Rinse 4 times per day   pregabalin  200 mg Oral TID   sodium chloride  flush  3 mL Intravenous Q12H   verapamil  120 mg Oral TID   Continuous Infusions:  promethazine  (PHENERGAN ) injection (IM or IVPB) Stopped (02/12/24 0437)    Nutritional status     Body mass index is 26.41 kg/m.  Data Reviewed:   CBC: Recent Labs  Lab 02/06/24 1611  02/07/24 0020 02/10/24 2325 02/11/24 0010 02/12/24 0517  WBC 8.2 8.0 7.0  --  6.1  NEUTROABS 5.5  --   --   --   --   HGB 12.5 11.5* 12.9 12.2 13.0  HCT 37.8 35.8* 37.6 36.0 40.9  MCV 83.6 88.0 80.0  --  86.7  PLT 197 185 211  --  182   Basic Metabolic Panel: Recent Labs  Lab 02/10/24 2325 02/11/24 0010 02/11/24 0238 02/11/24 0910 02/11/24 1311 02/12/24 0517  NA 125* 122* 131* 136 134* 134*  K 4.4 5.0 3.9 3.5 3.2* 4.2  CL 83*  --  94* 103 101 103  CO2 18*  --  21* 25 26 21*  GLUCOSE 709*  --  518* 149* 166* 356*  BUN 26*  --  22* 22* 23* 16  CREATININE 1.18*  --  0.96 0.75 0.78 0.61  CALCIUM 8.8*  --  7.9* 8.3* 8.2* 8.5*  MG  --   --   --   --   --  2.2   GFR: Estimated Creatinine Clearance: 121.3 mL/min (by C-G formula based on SCr of 0.61 mg/dL). Liver Function Tests: Recent Labs  Lab 02/06/24 1611 02/11/24 0238  AST 17 12*  ALT 10 8  ALKPHOS 106 94  BILITOT <0.2 <0.2  PROT 7.0 6.1*  ALBUMIN 3.7 3.2*   Recent Labs  Lab 02/06/24 1611 02/11/24 0238  LIPASE 37 16   No results for input(s): "AMMONIA" in the last 168 hours. Coagulation Profile: No results for input(s): "INR", "PROTIME" in the last 168 hours. Cardiac Enzymes: No results for input(s): "CKTOTAL", "CKMB", "CKMBINDEX", "TROPONINI" in the last 168 hours. BNP (last 3 results) No results for input(s): "PROBNP" in the last 8760 hours. HbA1C: No results for input(s): "HGBA1C" in the last 72 hours. CBG: Recent Labs  Lab 02/11/24 1409 02/11/24 1516 02/11/24 1632 02/11/24 2159 02/12/24 0744  GLUCAP 153* 130* 150* 350* 457*   Lipid Profile: No results for input(s): "CHOL", "HDL", "LDLCALC", "TRIG", "CHOLHDL", "LDLDIRECT" in the last 72 hours. Thyroid Function Tests: No results for input(s): "TSH", "T4TOTAL", "FREET4", "T3FREE", "THYROIDAB" in the last 72 hours. Anemia Panel: No results for input(s): "VITAMINB12", "FOLATE", "FERRITIN", "TIBC", "IRON", "RETICCTPCT" in the last 72 hours. Sepsis  Labs: No results for input(s): "PROCALCITON", "LATICACIDVEN" in the last 168 hours.  Recent Results (from the past 240 hours)  MRSA Next Gen by PCR, Nasal     Status: None   Collection Time: 02/06/24  9:42 PM   Specimen: Nasal Mucosa; Nasal Swab  Result Value Ref Range Status   MRSA by PCR Next Gen NOT DETECTED NOT DETECTED Final    Comment: (NOTE) The GeneXpert MRSA Assay (FDA approved for NASAL specimens only), is one  component of a comprehensive MRSA colonization surveillance program. It is not intended to diagnose MRSA infection nor to guide or monitor treatment for MRSA infections. Test performance is not FDA approved in patients less than 44 years old. Performed at [facility], [address], [place], [place] [address]   MRSA Next Gen by PCR, Nasal     Status: None   Collection Time: 02/11/24  6:58 AM   Specimen: Nasal Mucosa; Nasal Swab  Result Value Ref Range Status   MRSA by PCR Next Gen NOT DETECTED NOT DETECTED Final    Comment: (NOTE) The GeneXpert MRSA Assay (FDA approved for NASAL specimens only), is one component of a comprehensive MRSA colonization surveillance program. It is not intended to diagnose MRSA infection nor to guide or monitor treatment for MRSA infections. Test performance is not FDA approved in patients less than 76 years old. Performed at [facility], [address] [place], [place] [address]          Radiology Studies: No results found.         LOS: 1 day   Time spent= 35 mins    Maggie Schooner, MD [facility]  If 7PM-7AM, please contact night-coverage  02/12/2024, 10:09 AM

## 2024-02-12 NOTE — Progress Notes (Signed)
 OT Cancellation Note  Patient Details Name: Daylene Vandenbosch MRN: [id number] DOB: December 10, 1990   Cancelled Treatment:    Reason Eval/Treat Not Completed: Other (comment) Patient is in process of switching floors. OT to continue to follow and check back as schedule will allow.  Wynette Heckler, MS Acute Rehabilitation Department Office# [phone]  02/12/2024, 1:45 PM
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
## 2024-02-13 DIAGNOSIS — E1010 Type 1 diabetes mellitus with ketoacidosis without coma: Secondary | ICD-10-CM | POA: Diagnosis not present

## 2024-02-13 LAB — MAGNESIUM: Magnesium: 1.9 mg/dL (ref 1.7–2.4)

## 2024-02-13 LAB — GLUCOSE, CAPILLARY
Glucose-Capillary: 285 mg/dL — ABNORMAL HIGH (ref 70–99)
Glucose-Capillary: 204 mg/dL — ABNORMAL HIGH (ref 70–99)
Glucose-Capillary: 275 mg/dL — ABNORMAL HIGH (ref 70–99)

## 2024-02-13 LAB — BASIC METABOLIC PANEL WITH GFR
Anion gap: 8 (ref 5–15)
BUN: 13 mg/dL (ref 6–20)
CO2: 24 mmol/L (ref 22–32)
Calcium: 8.4 mg/dL — ABNORMAL LOW (ref 8.9–10.3)
Chloride: 105 mmol/L (ref 98–111)
Creatinine, Ser: 0.82 mg/dL (ref 0.44–1.00)
GFR, Estimated: >60 mL/min (ref >60)
Glucose, Bld: 231 mg/dL — ABNORMAL HIGH (ref 70–99)
Potassium: 4 mmol/L (ref 3.5–5.1)
Sodium: 137 mmol/L (ref 135–145)

## 2024-02-13 LAB — CBC
HCT: 38.6 % (ref 36.0–46.0)
Hemoglobin: 12.2 g/dL (ref 12.0–15.0)
MCH: 27.5 pg (ref 26.0–34.0)
MCHC: 31.6 g/dL (ref 30.0–36.0)
MCV: 87.1 fL (ref 80.0–100.0)
Platelets: 199 10*3/uL (ref 150–400)
RBC: 4.43 MIL/uL (ref 3.87–5.11)
RDW: 13.3 % (ref 11.5–15.5)
WBC: 6.3 10*3/uL (ref 4.0–10.5)
nRBC: 0 % (ref 0.0–0.2)

## 2024-02-13 MED ORDER — PRAZOSIN HCL 2 MG PO CAPS: 2.0000 mg | ORAL_CAPSULE | Freq: Every evening | ORAL | 0 refills | Status: AC | PRN

## 2024-02-13 MED ORDER — PROMETHAZINE HCL 25 MG RE SUPP: 25.0000 mg | Freq: Four times a day (QID) | RECTAL | 0 refills | Status: AC | PRN

## 2024-02-13 MED ORDER — INSULIN PUMP
SUBCUTANEOUS | Status: DC
  Filled 2024-02-13: qty 1

## 2024-02-13 MED FILL — Insulin Aspart Inj Soln 100 Unit/ML: INTRAMUSCULAR | Qty: 10 | Status: AC
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
## 2024-02-13 NOTE — Progress Notes (Signed)
 AVS reviewed w/ pt who verbalized an understanding - pt requesting Phenergan  suppositories for home - current script- pt is out at home. Pt also asked for OxyContin  which was denied earlier by Dr Ariel Begun per primary nurse. PIV out as noted- secure chat  sent to Dr Ariel Begun for phenergan . Pt waiting on ride home - her mom should be here at 1800. No other questions at this time.

## 2024-02-13 NOTE — Plan of Care (Signed)
  Problem: Education: Goal: Knowledge of General Education information will improve Description: Including pain rating scale, medication(s)/side effects and non-pharmacologic comfort measures Outcome: Not Progressing   Problem: Health Behavior/Discharge Planning: Goal: Ability to manage health-related needs will improve Outcome: Not Progressing   Problem: Clinical Measurements: Goal: Ability to maintain clinical measurements within normal limits will improve Outcome: Progressing Goal: Will remain free from infection Outcome: Progressing Goal: Diagnostic test results will improve Outcome: Progressing   Problem: Activity: Goal: Risk for activity intolerance will decrease Outcome: Not Progressing   Problem: Nutrition: Goal: Adequate nutrition will be maintained Outcome: Not Progressing   Problem: Coping: Goal: Level of anxiety will decrease Outcome: Not Progressing

## 2024-02-13 NOTE — Progress Notes (Addendum)
 OT Cancellation Note  Patient Details Name: Julie [name] MRN: [id number] DOB: 17-Jul-1991   Cancelled Treatment:    Reason Eval/Treat Not Completed: OT screened, no needs identified, will sign off Per PT, not OT needs at this time. Patient is pending d/c at this time. OT to sign off.  Wynette Heckler, MS Acute Rehabilitation Department Office# [phone]  02/13/2024, 12:10 PM

## 2024-02-13 NOTE — Evaluation (Signed)
 Physical Therapy Evaluation Patient Details Name: Julie [name] MRN: [id number] DOB: 04/18/1991 Today's Date: 02/13/2024  History of Present Illness  Pt is a 33yo female who presents with general malaise, nausea, vomiting, and elevated blood glucose; found to be in diabetic ketoacidosis (pt has insulin  pump).  PMH: bulimia nervosa, DM, hx of pelvic fx,  chronic pain, PTSD, depression, anxiety, chronic bilateral lower extremity weakness, chronic pruritus, chronic nausea,  Clinical Impression  Patient evaluated by Physical Therapy with no further acute PT needs identified. All education has been completed and the patient has no further questions. Pt presenting supine in bed, giving long history of complaints especially with BLE "shooting pain," 8/10, pt calm and without nonverbal signs of pain, resting comfortably. Pt required CGA to SUP for bed mobility, transfer to RW, and ambulation in hallway. Recommended to pt that she sleep downstairs until she can recover the strength to ascend/descend stairs in her townhome safely, pt requesting hospital bed. Pt has all other DME. If pt remains acutely she woul benefit from mobility specialists. See below for any follow-up Physical Therapys. PT is signing off. Thank you for this referral.        If plan is discharge home, recommend the following: Help with stairs or ramp for entrance;A little help with walking and/or transfers;A little help with bathing/dressing/bathroom;Assistance with cooking/housework   Can travel by private vehicle        Equipment Recommendations None recommended by PT (Pt has necessary DME. Pt requesting hospital bed but pt does not meet criteria, recommended to pt sleep on couch/bed downstairs to avoid stair mobility.)  Recommendations for Other Services       Functional Status Assessment Patient has had a recent decline in their functional status and demonstrates the ability to make significant improvements in function in  a reasonable and predictable amount of time.     Precautions / Restrictions Precautions Precautions: Fall Precaution/Restrictions Comments: pt reports 11 falls since 09/2023 Restrictions Weight Bearing Restrictions Per Provider Order: No      Mobility  Bed Mobility Overal bed mobility: Modified Independent             General bed mobility comments: Increased time, HOB elevated, use of bed rail, significant delays in bringing BLE off bed with UE assistance, able to sit EOB without UE support, SUP from PT for safety during MMTs    Transfers Overall transfer level: Needs assistance Equipment used: Rolling walker (2 wheels) Transfers: Sit to/from Stand Sit to Stand: Contact guard assist           General transfer comment: For safety only.    Ambulation/Gait Ambulation/Gait assistance: Supervision Gait Distance (Feet): 100 Feet Assistive device: Rolling walker (2 wheels) Gait Pattern/deviations: Step-through pattern, Knees buckling, Trunk flexed Gait velocity: decreased     General Gait Details: Pt ambulated with RW and CGA progressed quickly to SUP, no overt LOB noted other than single instance of R knee buckling pt able to self-correct stating "that's how I fall," pt without any other visual demonstration of LOB, 2-90deg turns and 1-180deg turn.  Stairs            Wheelchair Mobility     Tilt Bed    Modified Rankin (Stroke Patients Only)       Balance Overall balance assessment: Needs assistance, History of Falls Sitting-balance support: Feet supported, No upper extremity supported Sitting balance-Leahy Scale: Good     Standing balance support: Reliant on assistive device for balance, During functional activity, Bilateral upper  extremity supported Standing balance-Leahy Scale: Poor                               Pertinent Vitals/Pain Pain Assessment Pain Assessment: 0-10 Pain Score: 8  Pain Location: BLE "all down my legs Pain  Descriptors / Indicators: Pins and needles, Contraction Pain Intervention(s): Limited activity within patient's tolerance, Monitored during session, Repositioned    Home Living Family/patient expects to be discharged to:: Private residence Living Arrangements: Children;Parent Available Help at Discharge: Family;Available PRN/intermittently Type of Home: House Home Access: Stairs to enter Entrance Stairs-Rails: None Entrance Stairs-Number of Steps: 1 Alternate Level Stairs-Number of Steps: 12 Home Layout: Two level Home Equipment: Agricultural consultant (2 wheels);Cane - single point;Rollator (4 wheels)      Prior Function Prior Level of Function : History of Falls (last six months);Driving;Working/employed             Mobility Comments: IND, uses rollator at baseline, fell down stairs on 5/10 ADLs Comments: daughter helps with pants     Extremity/Trunk Assessment   Upper Extremity Assessment Upper Extremity Assessment: Overall WFL for tasks assessed    Lower Extremity Assessment Lower Extremity Assessment: RLE deficits/detail;LLE deficits/detail RLE Deficits / Details: Grossly 3+/5 of hip/knee/ankle, suspect limitations affective in nature rather than from MSK cause. RLE Sensation: WNL LLE Deficits / Details: Grossly 3/5 of hip/knee/ankle, suspect limitations affective in nature rather than from MSK cause. LLE Sensation: WNL    Cervical / Trunk Assessment Cervical / Trunk Assessment: Normal  Communication   Communication Communication: No apparent difficulties    Cognition Arousal: Lethargic, Alert Behavior During Therapy: WFL for tasks assessed/performed, Flat affect   PT - Cognitive impairments: No apparent impairments                         Following commands: Intact       Cueing Cueing Techniques: Verbal cues     General Comments      Exercises     Assessment/Plan    PT Assessment All further PT needs can be met in the next venue of care  PT  Problem List Decreased strength;Decreased activity tolerance;Decreased balance       PT Treatment Interventions      PT Goals (Current goals can be found in the Care Plan section)  Acute Rehab PT Goals Patient Stated Goal: To go home PT Goal Formulation: With patient Time For Goal Achievement: 02/27/24 Potential to Achieve Goals: Fair    Frequency       Co-evaluation               AM-PAC PT "6 Clicks" Mobility  Outcome Measure Help needed turning from your back to your side while in a flat bed without using bedrails?: A Little Help needed moving from lying on your back to sitting on the side of a flat bed without using bedrails?: A Little Help needed moving to and from a bed to a chair (including a wheelchair)?: A Little Help needed standing up from a chair using your arms (e.g., wheelchair or bedside chair)?: A Little Help needed to walk in hospital room?: A Little Help needed climbing 3-5 steps with a railing? : A Lot 6 Click Score: 17    End of Session Equipment Utilized During Treatment: Gait belt Activity Tolerance: Patient tolerated treatment well;No increased pain Patient left: in chair;with call bell/phone within reach;with chair alarm set Nurse Communication:  Mobility status PT Visit Diagnosis: History of falling (Z91.81);Difficulty in walking, not elsewhere classified (R26.2)    Time: 3086-5784 PT Time Calculation (min) (ACUTE ONLY): 30 min   Charges:   PT Evaluation $PT Eval Moderate Complexity: 1 Mod PT Treatments $Gait Training: 8-22 mins PT General Charges $$ ACUTE PT VISIT: 1 Visit         Jerrye Mori, PT, DPT [facility] Office: [phone]  Jerrye Mori 02/13/2024, 12:18 PM

## 2024-02-13 NOTE — Progress Notes (Signed)
 This nurse observed patient drawing up 200 units of NovoLog  insulin , loading and applying insulin  pump to her posterior left arm.  She verbalized understanding of staff's continued need to continue to check her CBGs via hospital glucometer.  Rainey Burden, RN

## 2024-02-13 NOTE — Discharge Summary (Signed)
 Physician Discharge Summary  Korynne Dols OZH:[id number] DOB: Oct 18, 1990 DOA: 02/10/2024  PCP: Climmie Damme, PA-C  Admit date: 02/10/2024 Discharge date: 02/13/2024  Admitted From: home Disposition:  home  Recommendations for Outpatient Follow-up:  Follow up with PCP in 1-2 weeks Please obtain BMP/CBC in one week your next doctors visit.  Resume insulin  pump, should follow-up outpatient endocrinology Prazosin increased to 2 mg at bedtime as needed Discontinue Valium, Adderall, Flexeril .  She is not on these medications  Discharge Condition: Stable CODE STATUS: Full code Diet recommendation: Diabetic  Brief/Interim Summary:  Brief Narrative:  33 year old with history of chronic pain, PTSD, depression, anxiety, chronic bilateral lower extremity weakness, chronic pruritus, DM1 admitted for general malaise weakness and hyperglycemia.  Upon admission found to be in diabetic ketoacidosis.  Eventually patient was transition to subcu insulin  and to insulin  pump upon discharge. During the hospitalization patient continued to ask for IV morphine , Dilaudid , IV Benadryl  and IV promethazine .  She continued report of dysphagia and denies having prior evaluation but looking at the records she [facility]  She also reports chronic urinary retention issues.  She has been advised by the self-catheterization at home otherwise plans to discharge her with Foley catheter with outpatient urology follow-up. Medically stable for discharge   Assessment & Plan:  Principal Problem:   DKA (diabetic ketoacidosis) (HCC) Active Problems:   Depression with anxiety   Chronic back pain   PTSD (post-traumatic stress disorder)   DKA  History of diabetes mellitus type 1 - A1c was 13.2% in May 2205; she uses insulin  pump at home which was recently replaced  - Off Endo tool now on subcu insulin , will transition to her OmniPod/Dexcom today.  Insulin  has been  provided   Depression, anxiety, PTSD  - Seen by psychiatry, medications adjusted.  Discontinue Valium, prazosin increased 2 mg.   Dysphagia - Complaint.  Has seen by GI in the past and had a recent endoscopy.  No obvious abnormality found.  She has been recommending outpatient emptying study.  Chronic urinary retention, Foley placed.  Will need outpatient urology follow-up  AVOID USE OF IV NARCOTICS without any clear indication   DVT prophylaxis: Lovenox     Code Status: Full Code Family Communication:   Status is: Inpatient Remains inpatient appropriate because: The charge  Subjective: No complaints Examination:  General exam: Appears calm and comfortable  Respiratory system: Clear to auscultation. Respiratory effort normal. Cardiovascular system: S1 & S2 heard, RRR. No JVD, murmurs, rubs, gallops or clicks. No pedal edema. Gastrointestinal system: Abdomen is nondistended, soft and nontender. No organomegaly or masses felt. Normal bowel sounds heard. Central nervous system: Alert and oriented. No focal neurological deficits. Extremities: Symmetric 5 x 5 power. Skin: No rashes, lesions or ulcers Psychiatry: Judgement and insight appear normal. Mood & affect appropriate.    Discharge Diagnoses:  Principal Problem:   DKA (diabetic ketoacidosis) (HCC) Active Problems:   Depression with anxiety   Chronic back pain   PTSD (post-traumatic stress disorder)   Generalized anxiety disorder      Discharge Exam: Vitals:   02/13/24 0142 02/13/24 0442  BP: 128/71 119/77  Pulse: 77 83  Resp: 18 18  Temp: 98.2 F (36.8 C) 98 F (36.7 C)  SpO2: 96% 98%   Vitals:   02/12/24 1300 02/12/24 2029 02/13/24 0142 02/13/24 0442  BP: (!) 101/56 (!) 116/51 128/71 119/77  Pulse: 89 94 77 83  Resp: 14 18 18 18   Temp:  97.8 F (36.6 C) 98 F (36.7 C) 98.2 F (36.8 C) 98 F (36.7 C)  TempSrc: Oral Oral Oral Oral  SpO2: 94% 100% 96% 98%  Weight:      Height:          Discharge  Instructions   Allergies as of 02/13/2024       Reactions   Chloroprocaine Anaphylaxis   Iodinated Contrast Media Itching   Lidocaine  Anaphylaxis   Olanzapine Itching, Nausea And Vomiting, Nausea Only, Other (See Comments)   Ondansetron  Hcl Itching   Penicillins Itching   Has tolerated zosyn   Prochlorperazine Anaphylaxis, Swelling, Anxiety   Sulfa  Antibiotics Hives   Toradol  [ketorolac  Tromethamine ] Hives   Metoclopramide  Other (See Comments)   Cramps   Tramadol Nausea And Vomiting   Celebrex [celecoxib] Diarrhea   Hydroxyzine  Other (See Comments)   Makes her more anxious         Medication List     STOP taking these medications    amphetamine-dextroamphetamine 20 MG tablet Commonly known as: ADDERALL   cyclobenzaprine  10 MG tablet Commonly known as: FLEXERIL    diazepam 10 MG tablet Commonly known as: VALIUM       TAKE these medications    citalopram 40 MG tablet Commonly known as: CELEXA Take 40 mg by mouth daily.   Dexcom G6 Sensor Misc Place 1 each onto the skin See admin instructions. Change sensor every 10 days   insulin  aspart 100 UNIT/ML FlexPen Commonly known as: NovoLOG  FlexPen Inject 12 Units into the skin 3 (three) times daily with meals. What changed:  how much to take when to take this additional instructions   linaclotide 145 MCG Caps capsule Commonly known as: LINZESS Take 145 mcg by mouth daily before breakfast.   lisdexamfetamine 60 MG capsule Commonly known as: VYVANSE  Take 60 mg by mouth daily.   mirtazapine 15 MG tablet Commonly known as: REMERON Take 15 mg by mouth at bedtime.   Omnipod 5 DexG7G6 Pods Gen 5 Misc Inject 1 Device into the skin every 3 (three) days.   prazosin 2 MG capsule Commonly known as: MINIPRESS Take 1 capsule (2 mg total) by mouth at bedtime as needed (sleep). What changed:  medication strength how much to take   pregabalin 200 MG capsule Commonly known as: LYRICA Take 200 mg by mouth 3  (three) times daily.   promethazine  12.5 MG tablet Commonly known as: PHENERGAN  Take 12.5 mg by mouth every 12 (twelve) hours as needed for nausea, vomiting or refractory nausea / vomiting.   promethazine  25 MG suppository Commonly known as: PHENERGAN  Place 1 suppository (25 mg total) rectally every 6 (six) hours as needed for nausea or vomiting.   SUMAtriptan 100 MG tablet Commonly known as: IMITREX Take 100 mg by mouth every 2 (two) hours as needed for migraine or headache. MAX 2 doses (200mg ) in 24 hours   traZODone 50 MG tablet Commonly known as: DESYREL Take 50 mg by mouth at bedtime.   verapamil 120 MG tablet Commonly known as: CALAN Take 120 mg by mouth 3 (three) times daily.        Allergies  Allergen Reactions   Chloroprocaine Anaphylaxis   Iodinated Contrast Media Itching   Lidocaine  Anaphylaxis   Olanzapine Itching, Nausea And Vomiting, Nausea Only and Other (See Comments)   Ondansetron  Hcl Itching   Penicillins Itching    Has tolerated zosyn   Prochlorperazine Anaphylaxis, Swelling and Anxiety   Sulfa  Antibiotics Hives   Toradol  [Ketorolac  Tromethamine ] Hives  Metoclopramide  Other (See Comments)    Cramps     Tramadol Nausea And Vomiting   Celebrex [Celecoxib] Diarrhea   Hydroxyzine  Other (See Comments)    Makes her more anxious     You were cared for by a hospitalist during your hospital stay. If you have any questions about your discharge medications or the care you received while you were in the hospital after you are discharged, you can call the unit and asked to speak with the hospitalist on call if the hospitalist that took care of you is not available. Once you are discharged, your primary care physician will handle any further medical issues. Please note that no refills for any discharge medications will be authorized once you are discharged, as it is imperative that you return to your primary care physician (or establish a relationship with a  primary care physician if you do not have one) for your aftercare needs so that they can reassess your need for medications and monitor your lab values.  You were cared for by a hospitalist during your hospital stay. If you have any questions about your discharge medications or the care you received while you were in the hospital after you are discharged, you can call the unit and asked to speak with the hospitalist on call if the hospitalist that took care of you is not available. Once you are discharged, your primary care physician will handle any further medical issues. Please note that NO REFILLS for any discharge medications will be authorized once you are discharged, as it is imperative that you return to your primary care physician (or establish a relationship with a primary care physician if you do not have one) for your aftercare needs so that they can reassess your need for medications and monitor your lab values.  Please request your Prim.MD to go over all Hospital Tests and Procedure/Radiological results at the follow up, please get all Hospital records sent to your Prim MD by signing hospital release before you go home.  Get CBC, CMP, 2 view Chest X ray checked  by Primary MD during your next visit or SNF MD in 5-7 days ( we routinely change or add medications that can affect your baseline labs and fluid status, therefore we recommend that you get the mentioned basic workup next visit with your PCP, your PCP may decide not to get them or add new tests based on their clinical decision)  On your next visit with your primary care physician please Get Medicines reviewed and adjusted.  If you experience worsening of your admission symptoms, develop shortness of breath, life threatening emergency, suicidal or homicidal thoughts you must seek medical attention immediately by calling 911 or calling your MD immediately  if symptoms less severe.  You Must read complete instructions/literature along  with all the possible adverse reactions/side effects for all the Medicines you take and that have been prescribed to you. Take any new Medicines after you have completely understood and accpet all the possible adverse reactions/side effects.   Do not drive, operate heavy machinery, perform activities at heights, swimming or participation in water activities or provide baby sitting services if your were admitted for syncope or siezures until you have seen by Primary MD or a Neurologist and advised to do so again.  Do not drive when taking Pain medications.   Procedures/Studies: No results found.   The results of significant diagnostics from this hospitalization (including imaging, microbiology, ancillary and laboratory) are listed below for reference.  Microbiology: Recent Results (from the past 240 hours)  MRSA Next Gen by PCR, Nasal     Status: None   Collection Time: 02/06/24  9:42 PM   Specimen: Nasal Mucosa; Nasal Swab  Result Value Ref Range Status   MRSA by PCR Next Gen NOT DETECTED NOT DETECTED Final    Comment: (NOTE) The GeneXpert MRSA Assay (FDA approved for NASAL specimens only), is one component of a comprehensive MRSA colonization surveillance program. It is not intended to diagnose MRSA infection nor to guide or monitor treatment for MRSA infections. Test performance is not FDA approved in patients less than 36 years old. Performed at [facility], [address], [place], [place] [address]   MRSA Next Gen by PCR, Nasal     Status: None   Collection Time: 02/11/24  6:58 AM   Specimen: Nasal Mucosa; Nasal Swab  Result Value Ref Range Status   MRSA by PCR Next Gen NOT DETECTED NOT DETECTED Final    Comment: (NOTE) The GeneXpert MRSA Assay (FDA approved for NASAL specimens only), is one component of a comprehensive MRSA colonization surveillance program. It is not intended to diagnose MRSA infection nor to guide or monitor treatment for MRSA  infections. Test performance is not FDA approved in patients less than 16 years old. Performed at [facility], [address] [place], [place] [address]      Labs: BNP (last 3 results) No results for input(s): "BNP" in the last 8760 hours. Basic Metabolic Panel: Recent Labs  Lab 02/11/24 0238 02/11/24 0910 02/11/24 1311 02/12/24 0517 02/13/24 0934  NA 131* 136 134* 134* 137  K 3.9 3.5 3.2* 4.2 4.0  CL 94* 103 101 103 105  CO2 21* 25 26 21* 24  GLUCOSE 518* 149* 166* 356* 231*  BUN 22* 22* 23* 16 13  CREATININE 0.96 0.75 0.78 0.61 0.82  CALCIUM 7.9* 8.3* 8.2* 8.5* 8.4*  MG  --   --   --  2.2 1.9   Liver Function Tests: Recent Labs  Lab 02/06/24 1611 02/11/24 0238  AST 17 12*  ALT 10 8  ALKPHOS 106 94  BILITOT <0.2 <0.2  PROT 7.0 6.1*  ALBUMIN 3.7 3.2*   Recent Labs  Lab 02/06/24 1611 02/11/24 0238  LIPASE 37 16   No results for input(s): "AMMONIA" in the last 168 hours. CBC: Recent Labs  Lab 02/06/24 1611 02/07/24 0020 02/10/24 2325 02/11/24 0010 02/12/24 0517 02/13/24 0934  WBC 8.2 8.0 7.0  --  6.1 6.3  NEUTROABS 5.5  --   --   --   --   --   HGB 12.5 11.5* 12.9 12.2 13.0 12.2  HCT 37.8 35.8* 37.6 36.0 40.9 38.6  MCV 83.6 88.0 80.0  --  86.7 87.1  PLT 197 185 211  --  182 199   Cardiac Enzymes: No results for input(s): "CKTOTAL", "CKMB", "CKMBINDEX", "TROPONINI" in the last 168 hours. BNP: Invalid input(s): "POCBNP" CBG: Recent Labs  Lab 02/12/24 1022 02/12/24 1144 02/12/24 1917 02/12/24 2112 02/13/24 0722  GLUCAP 244* 198* 108* 163* 285*   D-Dimer No results for input(s): "DDIMER" in the last 72 hours. Hgb A1c No results for input(s): "HGBA1C" in the last 72 hours. Lipid Profile No results for input(s): "CHOL", "HDL", "LDLCALC", "TRIG", "CHOLHDL", "LDLDIRECT" in the last 72 hours. Thyroid function studies No results for input(s): "TSH", "T4TOTAL", "T3FREE", "THYROIDAB" in the last 72 hours.  Invalid input(s):  "FREET3" Anemia work up No results for  input(s): "VITAMINB12", "FOLATE", "FERRITIN", "TIBC", "IRON", "RETICCTPCT" in the last 72 hours. Urinalysis    Component Value Date/Time   COLORURINE YELLOW 02/06/2024 1611   APPEARANCEUR CLEAR 02/06/2024 1611   LABSPEC 1.010 02/06/2024 1611   PHURINE 5.5 02/06/2024 1611   GLUCOSEU >=500 (A) 02/06/2024 1611   HGBUR NEGATIVE 02/06/2024 1611   BILIRUBINUR NEGATIVE 02/06/2024 1611   KETONESUR NEGATIVE 02/06/2024 1611   PROTEINUR NEGATIVE 02/06/2024 1611   UROBILINOGEN 0.2 02/04/2015 1125   NITRITE NEGATIVE 02/06/2024 1611   LEUKOCYTESUR NEGATIVE 02/06/2024 1611   Sepsis Labs Recent Labs  Lab 02/07/24 0020 02/10/24 2325 02/12/24 0517 02/13/24 0934  WBC 8.0 7.0 6.1 6.3   Microbiology Recent Results (from the past 240 hours)  MRSA Next Gen by PCR, Nasal     Status: None   Collection Time: 02/06/24  9:42 PM   Specimen: Nasal Mucosa; Nasal Swab  Result Value Ref Range Status   MRSA by PCR Next Gen NOT DETECTED NOT DETECTED Final    Comment: (NOTE) The GeneXpert MRSA Assay (FDA approved for NASAL specimens only), is one component of a comprehensive MRSA colonization surveillance program. It is not intended to diagnose MRSA infection nor to guide or monitor treatment for MRSA infections. Test performance is not FDA approved in patients less than 76 years old. Performed at [facility], [address], [place], [place] [address]   MRSA Next Gen by PCR, Nasal     Status: None   Collection Time: 02/11/24  6:58 AM   Specimen: Nasal Mucosa; Nasal Swab  Result Value Ref Range Status   MRSA by PCR Next Gen NOT DETECTED NOT DETECTED Final    Comment: (NOTE) The GeneXpert MRSA Assay (FDA approved for NASAL specimens only), is one component of a comprehensive MRSA colonization surveillance program. It is not intended to diagnose MRSA infection nor to guide or monitor treatment for MRSA infections. Test performance is not FDA  approved in patients less than 74 years old. Performed at [facility], [address] [place], [place] [address]      Time coordinating discharge:  I have spent 35 minutes face to face with the patient and on the ward discussing the patients care, assessment, plan and disposition with other care givers. >50% of the time was devoted counseling the patient about the risks and benefits of treatment/Discharge disposition and coordinating care.   SIGNED:   Maggie Schooner, MD  [facility] 02/13/2024, 11:34 AM   If 7PM-7AM, please contact night-coverage

## 2024-04-28 ENCOUNTER — Emergency Department (HOSPITAL_BASED_OUTPATIENT_CLINIC_OR_DEPARTMENT_OTHER)
Admission: EM | Admit: 2024-04-28 | Discharge: 2024-04-28 | Disposition: A | Discharge status: Home / Self Care | Attending: Emergency Medicine | Admitting: Emergency Medicine

## 2024-04-28 ENCOUNTER — Encounter (HOSPITAL_BASED_OUTPATIENT_CLINIC_OR_DEPARTMENT_OTHER): Payer: Self-pay | Admitting: Emergency Medicine

## 2024-04-28 DIAGNOSIS — T7840XA Allergy, unspecified, initial encounter: Secondary | ICD-10-CM | POA: Diagnosis not present

## 2024-04-28 DIAGNOSIS — Z794 Long term (current) use of insulin: Secondary | ICD-10-CM | POA: Insufficient documentation

## 2024-04-28 DIAGNOSIS — E109 Type 1 diabetes mellitus without complications: Secondary | ICD-10-CM | POA: Insufficient documentation

## 2024-04-28 DIAGNOSIS — R112 Nausea with vomiting, unspecified: Secondary | ICD-10-CM | POA: Insufficient documentation

## 2024-04-28 DIAGNOSIS — R0602 Shortness of breath: Secondary | ICD-10-CM | POA: Insufficient documentation

## 2024-04-28 DIAGNOSIS — L299 Pruritus, unspecified: Secondary | ICD-10-CM | POA: Diagnosis present

## 2024-04-28 LAB — CBG MONITORING, ED: Glucose-Capillary: 204 mg/dL — ABNORMAL HIGH (ref 70–99)

## 2024-04-28 MED ORDER — DIPHENHYDRAMINE HCL 50 MG/ML IJ SOLN: 25.0000 mg | Freq: Once | INTRAMUSCULAR | Status: DC

## 2024-04-28 MED ORDER — FAMOTIDINE IN NACL 20-0.9 MG/50ML-% IV SOLN: 20.0000 mg | Freq: Once | INTRAVENOUS | Status: DC

## 2024-04-28 MED ORDER — PROMETHAZINE HCL 25 MG/ML IJ SOLN
INTRAMUSCULAR | Status: AC
  Filled 2024-04-28: qty 1

## 2024-04-28 MED ORDER — FAMOTIDINE IN NACL 20-0.9 MG/50ML-% IV SOLN
20.0000 mg | Freq: Once | INTRAVENOUS | Status: DC
  Filled 2024-04-28: qty 50

## 2024-04-28 MED ORDER — PROMETHAZINE HCL 25 MG/ML IJ SOLN
12.5000 mg | Freq: Four times a day (QID) | INTRAMUSCULAR | Status: DC | PRN
  Administered 2024-04-28: 12.5 mg via INTRAMUSCULAR
  Filled 2024-04-28: qty 1

## 2024-04-28 MED ORDER — FENTANYL CITRATE PF 50 MCG/ML IJ SOSY
50.0000 ug | PREFILLED_SYRINGE | Freq: Once | INTRAMUSCULAR | Status: DC
  Filled 2024-04-28: qty 1

## 2024-04-28 MED ORDER — HYDROMORPHONE HCL 1 MG/ML IJ SOLN
0.5000 mg | Freq: Once | INTRAMUSCULAR | Status: AC
  Administered 2024-04-28: 0.5 mg via INTRAVENOUS
  Filled 2024-04-28: qty 1

## 2024-04-28 MED ORDER — SODIUM CHLORIDE 0.9 % IV SOLN
12.5000 mg | Freq: Four times a day (QID) | INTRAVENOUS | Status: DC | PRN
  Filled 2024-04-28: qty 0.5

## 2024-04-28 MED ORDER — SODIUM CHLORIDE 0.9 % IV SOLN
12.5000 mg | Freq: Once | INTRAVENOUS | Status: AC
  Administered 2024-04-28: 12.5 mg via INTRAVENOUS
  Filled 2024-04-28: qty 0.5

## 2024-04-28 MED ORDER — METHYLPREDNISOLONE SODIUM SUCC 125 MG IJ SOLR
125.0000 mg | Freq: Once | INTRAMUSCULAR | Status: DC
  Filled 2024-04-28: qty 2

## 2024-04-28 MED ORDER — DIPHENHYDRAMINE HCL 50 MG/ML IJ SOLN
25.0000 mg | Freq: Once | INTRAMUSCULAR | Status: AC
Start: 2024-04-28 — End: 2024-04-28
  Administered 2024-04-28: 25 mg via INTRAVENOUS
  Filled 2024-04-28: qty 1

## 2024-04-28 MED ORDER — DIPHENHYDRAMINE HCL 50 MG/ML IJ SOLN
25.0000 mg | Freq: Once | INTRAMUSCULAR | Status: DC
  Filled 2024-04-28: qty 1

## 2024-04-28 MED ORDER — DIPHENHYDRAMINE HCL 25 MG PO CAPS: 25.0000 mg | ORAL_CAPSULE | Freq: Once | ORAL | Status: DC

## 2024-04-28 MED ORDER — PROMETHAZINE HCL 25 MG/ML IJ SOLN
INTRAMUSCULAR | Status: DC
Start: 2024-04-28 — End: 2024-04-28
  Filled 2024-04-28: qty 1

## 2024-04-28 MED ORDER — DIPHENHYDRAMINE HCL 50 MG/ML IJ SOLN
50.0000 mg | Freq: Once | INTRAMUSCULAR | Status: AC
  Administered 2024-04-28: 50 mg via INTRAVENOUS

## 2024-04-28 MED ORDER — SODIUM CHLORIDE 0.9 % IV SOLN
12.5000 mg | Freq: Four times a day (QID) | INTRAVENOUS | Status: DC | PRN
  Administered 2024-04-28: 12.5 mg via INTRAVENOUS
  Filled 2024-04-28: qty 0.5

## 2024-04-28 MED ORDER — METHYLPREDNISOLONE SODIUM SUCC 125 MG IJ SOLR
125.0000 mg | Freq: Once | INTRAMUSCULAR | Status: AC
  Administered 2024-04-28: 125 mg via INTRAVENOUS

## 2024-04-28 MED ORDER — HYDROMORPHONE HCL 1 MG/ML IJ SOLN
1.0000 mg | Freq: Once | INTRAMUSCULAR | Status: AC
  Administered 2024-04-28: 1 mg via INTRAMUSCULAR
  Filled 2024-04-28: qty 1

## 2024-04-28 NOTE — ED Triage Notes (Signed)
 Pt c/o itching all over after taking Suzetrigine for the first time today at about 0940 today; no hives or rash noted, but pt is scratching skin and causing abrasions

## 2024-04-28 NOTE — ED Provider Notes (Signed)
 [place] EMERGENCY DEPARTMENT AT [facility] [place] Provider Note   CSN: [id number] Arrival date & time: 04/28/24  1106     Patient presents with: Allergic Reaction   Julie [name] is a 33 y.o. female patient with history of type 1 diabetes, diabetic neuropathy who presents to the emergency department today for further evaluation of intense pruritus after starting Suzetrigine.  She took this for the first time this morning and 30 minutes after started having intractable nausea and vomiting and pruritus.  Patient does state that she has allergies to quite a few medications.  Patient also complaining of diffuse pain as well. Mild shortness of breath.     Allergic Reaction      Prior to Admission medications   Medication Sig Start Date End Date Taking? Authorizing Provider  citalopram  (CELEXA ) 40 MG tablet Take 40 mg by mouth daily. Patient not taking: Reported on 02/13/2024 12/26/23   [provider]  Continuous Glucose Sensor (DEXCOM G6 SENSOR) MISC Place 1 each onto the skin See admin instructions. Change sensor every 10 days 12/13/23 12/12/24  [provider]  insulin  aspart (NOVOLOG  FLEXPEN) 100 UNIT/ML FlexPen Inject 12 Units into the skin 3 (three) times daily with meals. Patient taking differently: Inject 1.2 Units into the skin See admin instructions. 1.2 units via pump every 1 hour 04/10/17   Patsey Lot, MD  Insulin  Disposable Pump (OMNIPOD 5 DEXG7G6 PODS GEN 5) MISC Inject 1 Device into the skin every 3 (three) days.    [provider]  linaclotide  (LINZESS ) 145 MCG CAPS capsule Take 145 mcg by mouth daily before breakfast. Patient not taking: Reported on 02/13/2024 12/26/23   [provider]  lisdexamfetamine (VYVANSE ) 60 MG capsule Take 60 mg by mouth daily. Patient not taking: Reported on 02/13/2024 05/24/21   [provider]  mirtazapine (REMERON) 15 MG tablet Take 15 mg by mouth at bedtime. Patient not taking:  Reported on 02/13/2024    [provider]  prazosin  (MINIPRESS ) 2 MG capsule Take 1 capsule (2 mg total) by mouth at bedtime as needed (sleep). 02/13/24   Amin, Ankit C, MD  pregabalin  (LYRICA ) 200 MG capsule Take 200 mg by mouth 3 (three) times daily. Patient not taking: Reported on 02/13/2024    [provider]  promethazine  (PHENERGAN ) 12.5 MG tablet Take 12.5 mg by mouth every 12 (twelve) hours as needed for nausea, vomiting or refractory nausea / vomiting. Patient not taking: Reported on 02/13/2024    [provider]  promethazine  (PHENERGAN ) 25 MG suppository Place 1 suppository (25 mg total) rectally every 6 (six) hours as needed for nausea or vomiting. 02/13/24   Amin, Ankit C, MD  SUMAtriptan  (IMITREX ) 100 MG tablet Take 100 mg by mouth every 2 (two) hours as needed for migraine or headache. MAX 2 doses (200mg ) in 24 hours Patient not taking: Reported on 02/13/2024 02/04/24 05/04/24  [provider]  traZODone (DESYREL) 50 MG tablet Take 50 mg by mouth at bedtime. Patient not taking: Reported on 02/13/2024    [provider]  verapamil  (CALAN ) 120 MG tablet Take 120 mg by mouth 3 (three) times daily. Patient not taking: Reported on 02/13/2024 12/26/23   [provider]    Allergies: Chloroprocaine, Iodinated contrast media, Lidocaine , Olanzapine, Ondansetron  hcl, Penicillins, Prochlorperazine, Sulfa  antibiotics, Toradol  [ketorolac  tromethamine ], Metoclopramide , Tramadol, Celebrex [celecoxib], and Hydroxyzine     Review of Systems  All other systems reviewed and are negative.   Updated Vital Signs BP 123/86 (BP Location: Right  Arm)   Pulse 84   Temp 97.7 F (36.5 C) (Oral)   Resp 18   Ht 5' 11 (1.803 m)   Wt 90.3 kg   SpO2 97%   BMI 27.75 kg/m   Physical Exam Vitals and nursing note reviewed.  Constitutional:      General: She is not in acute distress.    Appearance: Normal appearance.  HENT:     Head: Normocephalic and  atraumatic.  Eyes:     General:        Right eye: No discharge.        Left eye: No discharge.  Cardiovascular:     Comments: Regular rate and rhythm.  S1/S2 are distinct without any evidence of murmur, rubs, or gallops.  Radial pulses are 2+ bilaterally.  Dorsalis pedis pulses are 2+ bilaterally.  No evidence of pedal edema. Pulmonary:     Comments: Clear to auscultation bilaterally.  Normal effort.  No respiratory distress.  No evidence of wheezes, rales, or rhonchi heard throughout. Abdominal:     General: Abdomen is flat. Bowel sounds are normal. There is no distension.     Tenderness: There is no abdominal tenderness. There is no guarding or rebound.  Musculoskeletal:        General: Normal range of motion.     Cervical back: Neck supple.  Skin:    General: Skin is warm and dry.     Comments: No obvious rash but there are diffuse excoriations and open abrasions from scratching to the bilateral arms and torso.  Neurological:     General: No focal deficit present.     Mental Status: She is alert.  Psychiatric:        Mood and Affect: Mood normal.        Behavior: Behavior normal.     (all labs ordered are listed, but only abnormal results are displayed) Labs Reviewed  CBG MONITORING, ED - Abnormal; Notable for the following components:      Result Value   Glucose-Capillary 204 (*)    All other components within normal limits    EKG: None  Radiology: No results found.   Procedures   Medications Ordered in the ED  promethazine  (PHENERGAN ) 12.5 mg in sodium chloride  0.9 % 50 mL IVPB (0 mg Intravenous Stopped 04/28/24 1343)  promethazine  (PHENERGAN ) injection 12.5 mg (12.5 mg Intramuscular Given 04/28/24 1526)  diphenhydrAMINE  (BENADRYL ) injection 50 mg (50 mg Intravenous Given 04/28/24 1218)  HYDROmorphone  (DILAUDID ) injection 0.5 mg (0.5 mg Intravenous Given 04/28/24 1253)  methylPREDNISolone  sodium succinate (SOLU-MEDROL ) 125 mg/2 mL injection 125 mg (125 mg Intravenous  Given 04/28/24 1221)  diphenhydrAMINE  (BENADRYL ) injection 25 mg (25 mg Intravenous Given 04/28/24 1347)  HYDROmorphone  (DILAUDID ) injection 0.5 mg (0.5 mg Intravenous Given 04/28/24 1347)  promethazine  (PHENERGAN ) 12.5 mg in sodium chloride  0.9 % 50 mL IVPB (0 mg Intravenous Stopped 04/28/24 1447)  HYDROmorphone  (DILAUDID ) injection 1 mg (1 mg Intramuscular Given 04/28/24 1524)    Clinical Course as of 04/28/24 1537  Sat Apr 28, 2024  1222 Will plan to give the patient some Solu-Medrol  Benadryl .  She states that she is allergic to fentanyl  but not Dilaudid  and she is also allergic to Pepcid .  Patient is concerned that her sugar will increase she is currently on insulin  pump.  I do feel the Solu-Medrol  will be beneficial given that she is having what appears to be an allergic reaction. [CF]  1506 Patient did not receive the second round of 12.5 mg  of Phenergan  as her IV was now infiltrated.  Plan will be to give her 12.5 mg Phenergan  IM and 1 mg Dilaudid  IM and plan to discharge home.  Patient suffers from chronic pain and we will not be able to fix that problem while she is here in the emergency department. [CF]    Clinical Course User Index [CF] Theotis Cameron HERO, PA-C    Medical Decision Making Krysia Zahradnik is a 33 y.o. female patient who presents to the emergency department today for further evaluation of intense pruritus.  Patient does appear to be very uncomfortable given the excoriations and is scratching.  Diffusely and intensely on exam.  This is likely signs of anaphylactic reaction.  I assume from the medication she just took this morning which was new for her.  Patient is maintaining her airway at this time and is not in any respiratory distress.  Patient's itching has greatly improved.  She has gotten 2 rounds of Dilaudid  here.  Patient does suffer from chronic pain and I will not be able to fix that today.  It has been managed here appropriately.  Will have her continue taking  Benadryl  at home.  Will hold off on steroids as she does have poorly controlled type 1 diabetes and did not want to send her in DKA or have large hyperglycemic spikes.  Strict turn precautions were discussed.  She is safe for discharge.  Risk Prescription drug management.     Final diagnoses:  Allergic reaction, initial encounter    ED Discharge Orders     None          Theotis Cameron HERO DEVONNA 04/28/24 1537    Doretha Folks, MD 05/02/24 [phone]

## 2024-04-28 NOTE — ED Notes (Signed)
 Denies questions or needs, VSS, steady gait, out with family

## 2024-04-28 NOTE — ED Notes (Signed)
 IV infiltrated during phenergan  infusion. IV removed, heat pack placed. Provider made aware.

## 2024-04-28 NOTE — Discharge Instructions (Signed)
 I would follow-up with your primary care doctor.  Continue taking Benadryl  as needed for the itching.  I will hold off on steroids as this will continue to spike your blood pressure.  You can also follow-up with your primary care doctor to talk about additional pain control.  You may return to the emergency department for any worsening symptoms.

## 2024-04-28 NOTE — ED Notes (Signed)
Given snack per request 

## 2024-04-28 NOTE — ED Notes (Signed)
 Alert, NAD, calm, interactive, resps e/u, speaking in clear complete sentences. Endorses itching, nausea and pain. Agreeable to IM injections, States, ready to go home. Family at [facility].
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
## 2024-05-17 ENCOUNTER — Encounter (HOSPITAL_BASED_OUTPATIENT_CLINIC_OR_DEPARTMENT_OTHER): Payer: Self-pay

## 2024-05-17 ENCOUNTER — Emergency Department (HOSPITAL_BASED_OUTPATIENT_CLINIC_OR_DEPARTMENT_OTHER)

## 2024-05-17 ENCOUNTER — Other Ambulatory Visit: Payer: Self-pay

## 2024-05-17 ENCOUNTER — Emergency Department (HOSPITAL_BASED_OUTPATIENT_CLINIC_OR_DEPARTMENT_OTHER)
Admission: EM | Admit: 2024-05-17 | Discharge: 2024-05-17 | Disposition: A | Discharge status: Home / Self Care | Attending: Emergency Medicine | Admitting: Emergency Medicine

## 2024-05-17 DIAGNOSIS — Z794 Long term (current) use of insulin: Secondary | ICD-10-CM | POA: Insufficient documentation

## 2024-05-17 DIAGNOSIS — R000 Tachycardia, unspecified: Secondary | ICD-10-CM | POA: Insufficient documentation

## 2024-05-17 DIAGNOSIS — E1165 Type 2 diabetes mellitus with hyperglycemia: Secondary | ICD-10-CM | POA: Insufficient documentation

## 2024-05-17 DIAGNOSIS — M25552 Pain in left hip: Secondary | ICD-10-CM | POA: Insufficient documentation

## 2024-05-17 DIAGNOSIS — R739 Hyperglycemia, unspecified: Secondary | ICD-10-CM | POA: Diagnosis present

## 2024-05-17 LAB — URINALYSIS, ROUTINE W REFLEX MICROSCOPIC
Bilirubin Urine: NEGATIVE
Glucose, UA: >=500 mg/dL — AB
Ketones, ur: NEGATIVE mg/dL
Leukocytes,Ua: NEGATIVE
Nitrite: NEGATIVE
Protein, ur: 100 mg/dL — AB
Specific Gravity, Urine: 1.01 (ref 1.005–1.030)
pH: 5.5 (ref 5.0–8.0)

## 2024-05-17 LAB — CBC
HCT: 37.8 % (ref 36.0–46.0)
Hemoglobin: 12.8 g/dL (ref 12.0–15.0)
MCH: 27.8 pg (ref 26.0–34.0)
MCHC: 33.9 g/dL (ref 30.0–36.0)
MCV: 82.2 fL (ref 80.0–100.0)
Platelets: 250 K/uL (ref 150–400)
RBC: 4.6 MIL/uL (ref 3.87–5.11)
RDW: 13.7 % (ref 11.5–15.5)
WBC: 8.5 K/uL (ref 4.0–10.5)
nRBC: 0 % (ref 0.0–0.2)

## 2024-05-17 LAB — CBG MONITORING, ED
Glucose-Capillary: >600 mg/dL (ref 70–99)
Glucose-Capillary: 232 mg/dL — ABNORMAL HIGH (ref 70–99)

## 2024-05-17 LAB — BASIC METABOLIC PANEL WITH GFR
Anion gap: 13 (ref 5–15)
BUN: 18 mg/dL (ref 6–20)
CO2: 23 mmol/L (ref 22–32)
Calcium: 9.8 mg/dL (ref 8.9–10.3)
Chloride: 98 mmol/L (ref 98–111)
Creatinine, Ser: 0.88 mg/dL (ref 0.44–1.00)
GFR, Estimated: >60 mL/min (ref >60)
Glucose, Bld: 526 mg/dL (ref 70–99)
Potassium: 4.6 mmol/L (ref 3.5–5.1)
Sodium: 133 mmol/L — ABNORMAL LOW (ref 135–145)

## 2024-05-17 LAB — URINALYSIS, MICROSCOPIC (REFLEX)

## 2024-05-17 LAB — I-STAT VENOUS BLOOD GAS, ED
Acid-Base Excess: 1 mmol/L (ref 0.0–2.0)
Bicarbonate: 24.6 mmol/L (ref 20.0–28.0)
Calcium, Ion: 1.18 mmol/L (ref 1.15–1.40)
HCT: 37 % (ref 36.0–46.0)
Hemoglobin: 12.6 g/dL (ref 12.0–15.0)
O2 Saturation: 87 %
Patient temperature: 98.1
Potassium: 4.6 mmol/L (ref 3.5–5.1)
Sodium: 134 mmol/L — ABNORMAL LOW (ref 135–145)
TCO2: 26 mmol/L (ref 22–32)
pCO2, Ven: 35.3 mmHg — ABNORMAL LOW (ref 44–60)
pH, Ven: 7.45 — ABNORMAL HIGH (ref 7.25–7.43)
pO2, Ven: 50 mmHg — ABNORMAL HIGH (ref 32–45)

## 2024-05-17 LAB — PREGNANCY, URINE: Preg Test, Ur: NEGATIVE

## 2024-05-17 LAB — BETA-HYDROXYBUTYRIC ACID: Beta-Hydroxybutyric Acid: 0.13 mmol/L (ref 0.05–0.27)

## 2024-05-17 MED ORDER — INSULIN ASPART 100 UNIT/ML IJ SOLN
12.0000 [IU] | Freq: Once | INTRAMUSCULAR | Status: AC
  Administered 2024-05-17: 12 [IU] via SUBCUTANEOUS

## 2024-05-17 MED ORDER — HYDROMORPHONE HCL 1 MG/ML IJ SOLN
1.0000 mg | Freq: Once | INTRAMUSCULAR | Status: AC
  Administered 2024-05-17: 1 mg via INTRAVENOUS
  Filled 2024-05-17: qty 1

## 2024-05-17 MED ORDER — DIAZEPAM 5 MG PO TABS
5.0000 mg | ORAL_TABLET | Freq: Once | ORAL | Status: DC
  Filled 2024-05-17: qty 1

## 2024-05-17 MED ORDER — LACTATED RINGERS IV BOLUS
2000.0000 mL | Freq: Once | INTRAVENOUS | Status: AC
  Administered 2024-05-17: 2000 mL via INTRAVENOUS

## 2024-05-17 MED ORDER — PROMETHAZINE HCL 25 MG/ML IJ SOLN
INTRAMUSCULAR | Status: AC
  Filled 2024-05-17: qty 1

## 2024-05-17 MED ORDER — PROMETHAZINE (PHENERGAN) 6.25MG IN NS 50ML IVPB
6.2500 mg | Freq: Once | INTRAVENOUS | Status: AC
  Administered 2024-05-17: 6.25 mg via INTRAVENOUS
  Filled 2024-05-17: qty 50

## 2024-05-17 MED ORDER — DIAZEPAM 5 MG/ML IJ SOLN
2.5000 mg | Freq: Once | INTRAMUSCULAR | Status: AC
  Administered 2024-05-17: 2.5 mg via INTRAVENOUS
  Filled 2024-05-17: qty 2

## 2024-05-17 NOTE — ED Triage Notes (Addendum)
 Pt reports slipping and falling on her left hip yesterday. Pain located in back, hip and leg Pain began 4 am this am. Blood sugar is now reading high since pain started. Pt has taken a total of 9 extra units of blood since morning. Currently has insulin  pump and wearing heart monitor

## 2024-05-17 NOTE — Discharge Instructions (Addendum)
 Please follow-up with your primary care doctor soon as possible to recheck your blood sugar and your pain symptoms.  You should also monitor your blood sugar at home with your Dexcom

## 2024-05-17 NOTE — ED Provider Notes (Signed)
 [place] [facility] Provider Note   CSN: [id number] Arrival date & time: 05/17/24  9043     Patient presents with: Fall and Hyperglycemia   Julie [name] is a 33 y.o. 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  Patient is also concerned because her blood sugars have been very high at home since she fell, over 600.  She says that she has been trying to control her sugars with her insulin  pump and her diet.  She is not certain why they spiked, poor she is under a lot of stress.  She says her pain in her left hip is significant now worse with any type of leg movement.  She has chronic pain in her hip but this is a flareup.  She typically walks with a cane or a walker.  I reviewed her external records and in July 2025, approximately 1 month ago, her A1c level was 11.5.  She has been referred to see an endocrinologist.  She also has an extensive list of allergies and contraindications to drugs.  She reports that Phenergan  is one of the few medications that works well for her nausea, she is feeling extremely nauseated at this time.  She reports that Dilaudid  works for her pain but not fentanyl  and morphine , which she has adverse reactions to, and Toradol  which she does not tolerate.  Patient reports about 2 weeks ago she was given a new medication developed an immediate itchy reaction on her bilateral forearms which she had been scratching at, leading to some excoriation marks.  She says overall the rash is improved since then.  There are no sick contacts in the house or anyone with similar rashes   HPI     Prior to Admission medications   Medication Sig Start Date End Date Taking? Authorizing Provider  citalopram  (CELEXA ) 40  MG tablet Take 40 mg by mouth daily. Patient not taking: Reported on 02/13/2024 12/26/23   [provider]  Continuous Glucose Sensor (DEXCOM G6 SENSOR) MISC Place 1 each onto the skin See admin instructions. Change sensor every 10 days 12/13/23 12/12/24  [provider]  insulin  aspart (NOVOLOG  FLEXPEN) 100 UNIT/ML FlexPen Inject 12 Units into the skin 3 (three) times daily with meals. Patient taking differently: Inject 1.2 Units into the skin See admin instructions. 1.2 units via pump every 1 hour 04/10/17   Patsey Lot, MD  Insulin  Disposable Pump (OMNIPOD 5 DEXG7G6 PODS GEN 5) MISC Inject 1 Device into the skin every 3 (three) days.    [provider]  linaclotide  (LINZESS ) 145 MCG CAPS capsule Take 145 mcg by mouth daily before breakfast. Patient not taking: Reported on 02/13/2024 12/26/23   [provider]  lisdexamfetamine (VYVANSE ) 60 MG capsule Take 60 mg by mouth daily. Patient not taking: Reported on 02/13/2024 05/24/21   [provider]  mirtazapine (REMERON) 15 MG tablet Take 15 mg by mouth at bedtime. Patient not taking: Reported on 02/13/2024    [provider]  prazosin  (MINIPRESS ) 2 MG capsule Take 1 capsule (2 mg total) by mouth at bedtime as needed (sleep). 02/13/24   Amin, Ankit C, MD  pregabalin  (LYRICA ) 200 MG capsule Take 200 mg by mouth 3 (three) times daily. Patient not taking: Reported on 02/13/2024  [provider]  promethazine  (PHENERGAN ) 12.5 MG tablet Take 12.5 mg by mouth every 12 (twelve) hours as needed for nausea, vomiting or refractory nausea / vomiting. Patient not taking: Reported on 02/13/2024    [provider]  promethazine  (PHENERGAN ) 25 MG suppository Place 1 suppository (25 mg total) rectally every 6 (six) hours as needed for nausea or vomiting. 02/13/24   Amin, Ankit C, MD  SUMAtriptan  (IMITREX ) 100 MG tablet Take 100 mg by mouth every 2 (two) hours as needed for migraine or headache. MAX 2  doses (200mg ) in 24 hours Patient not taking: Reported on 02/13/2024 02/04/24 05/04/24  [provider]  traZODone (DESYREL) 50 MG tablet Take 50 mg by mouth at bedtime. Patient not taking: Reported on 02/13/2024    [provider]  verapamil  (CALAN ) 120 MG tablet Take 120 mg by mouth 3 (three) times daily. Patient not taking: Reported on 02/13/2024 12/26/23   [provider]    Allergies: Chloroprocaine, Iodinated contrast media, Lidocaine , Olanzapine, Ondansetron  hcl, Penicillins, Prochlorperazine, Sulfa  antibiotics, Toradol  [ketorolac  tromethamine ], Metoclopramide , Tramadol, Celebrex [celecoxib], and Hydroxyzine     Review of Systems  Updated Vital Signs BP (!) 148/96   Pulse (!) 105   Temp 98.5 F (36.9 C) (Oral)   Resp (!) 22   Wt 90.3 kg   SpO2 100%   BMI 27.75 kg/m   Physical Exam Constitutional:      General: She is not in acute distress.    Comments: Sobbing, weeping  HENT:     Head: Normocephalic and atraumatic.  Eyes:     Conjunctiva/sclera: Conjunctivae normal.     Pupils: Pupils are equal, round, and reactive to light.  Cardiovascular:     Rate and Rhythm: Regular rhythm. Tachycardia present.  Pulmonary:     Effort: Pulmonary effort is normal. No respiratory distress.  Abdominal:     General: There is no distension.     Tenderness: There is no abdominal tenderness.  Musculoskeletal:     Comments: No visible shortening or rotation of the left leg, there is tenderness to palpation of left hip and pain with straight leg test on the left.  Patient is able to roll her leg actively around on the bed but not pick it up due to pain and reported weakness.  Skin:    General: Skin is warm and dry.     Comments: Excoriation marks with pockmark lesions in the bilateral forearms  Neurological:     General: No focal deficit present.     Mental Status: She is alert. Mental status is at baseline.     (all labs ordered are listed, but only abnormal  results are displayed) Labs Reviewed  BASIC METABOLIC PANEL WITH GFR - Abnormal; Notable for the following components:      Result Value   Sodium 133 (*)    Glucose, Bld 526 (*)    All other components within normal limits  URINALYSIS, ROUTINE W REFLEX MICROSCOPIC - Abnormal; Notable for the following components:   Glucose, UA >=500 (*)    Hgb urine dipstick TRACE (*)    Protein, ur 100 (*)    All other components within normal limits  URINALYSIS, MICROSCOPIC (REFLEX) - Abnormal; Notable for the following components:   Bacteria, UA RARE (*)    All other components within normal limits  CBG MONITORING, ED - Abnormal; Notable for the following components:   Glucose-Capillary >600 (*)    All other components within normal limits  I-STAT VENOUS BLOOD  GAS, ED - Abnormal; Notable for the following components:   pH, Ven 7.450 (*)    pCO2, Ven 35.3 (*)    pO2, Ven 50 (*)    Sodium 134 (*)    All other components within normal limits  CBG MONITORING, ED - Abnormal; Notable for the following components:   Glucose-Capillary 232 (*)    All other components within normal limits  BETA-HYDROXYBUTYRIC ACID  CBC  PREGNANCY, URINE  HEMOGLOBIN A1C    EKG: None  Radiology: DG Hip Unilat W or Wo Pelvis 2-3 Views Left Result Date: 05/17/2024 CLINICAL DATA:  fall onto hip, left hip pain. EXAM: DG HIP (WITH OR WITHOUT PELVIS) 2-3V LEFT COMPARISON:  None Available. FINDINGS: Pelvis is intact with normal and symmetric sacroiliac joints. No acute fracture or dislocation. No aggressive osseous lesion. Visualized sacral arcuate lines are unremarkable. Unremarkable symphysis pubis. Unremarkable bilateral hip joints. No radiopaque foreign bodies. Presumed T-shaped intrauterine device noted overlying the lower sacrum. Please note positioning of the IUD can not be determined on radiograph exam. IMPRESSION: No acute osseous abnormality of the pelvis or left hip joint. Electronically Signed   By: Ree Molt  M.D.   On: 05/17/2024 13:57     Procedures   Medications Ordered in the ED  lactated ringers  bolus 2,000 mL (0 mLs Intravenous Stopped 05/17/24 1449)  HYDROmorphone  (DILAUDID ) injection 1 mg (1 mg Intravenous Given 05/17/24 1214)  promethazine  (PHENERGAN ) 6.25 mg/NS 50 mL IVPB (0 mg Intravenous Stopped 05/17/24 1304)  insulin  aspart (novoLOG ) injection 12 Units (12 Units Subcutaneous Given 05/17/24 1248)  diazepam  (VALIUM ) injection 2.5 mg (2.5 mg Intravenous Given 05/17/24 1449)    Clinical Course as of 05/17/24 1517  Thu May 17, 2024  1245 Pt is hyperglycemic without evidence of DKA.  No acidosis, elevated anion gap, or ketones in the urine.  Will continue with IV fluids and insulin  at this time [MT]  1309 Pt emotionally labile throughout her stay in ED.  Home valium  ordered. From my review of her records she does see psychiatry and suffers from PTSD, insomnia, and anxiety.  She also follows in a chronic pain clinic and sees multiple specialists, including cardiology and neurology.  She tells me she cannot take oral medications because she has problems swallowing and IM medications cause severe burning.  I suspect there may be a psychosomatic component to some of her complaints; we'll screen for emergencies and stabilize her blood sugar here [MT]  1400 No acute fracture on xray pelvis [MT]  1420 Glucose 232 [MT]    Clinical Course User Index [MT] Scherrie Seneca, Donnice PARAS, MD                                 Medical Decision Making Amount and/or Complexity of Data Reviewed Labs: ordered. Radiology: ordered.  Risk Prescription drug management.   This patient presents to the ED with concern for mechanical fall and left hip pain as well as elevated blood sugars and anxiety, nausea. This involves an extensive number of treatment options, and is a complaint that carries with it a high risk of complications and morbidity.  The differential diagnosis includes hip sprain or injury versus fracture  versus other  Patient is noted to have high blood sugar and high risk of diabetic complications.  She is already on an insulin  pump which she reports had a fresh cartridge placed yesterday and not out of insulin .  Appears to  be poorly controlled or have difficulty controlling her diabetes per my review of external records with a persistently elevated A1c level.  Co-morbidities that complicate the patient evaluation: Diabetes at risk of metabolic complications  External records from outside source obtained and reviewed including outpatient labs as noted above  I ordered and personally interpreted labs.  The pertinent results include: Hyperglycemia without evidence of DKA.  No leukocytosis.  No evidence of UTI per urinalysis  I ordered imaging studies including x-ray of the left hip I independently visualized and interpreted imaging which showed no emergent findings I agree with the radiologist interpretation  The patient was maintained on a cardiac monitor.  I personally viewed and interpreted the cardiac monitored which showed an underlying rhythm of: Tachycardia  I ordered medication including IV fluids and insulin  for hyperglycemia, IV pain and nausea medication  I have reviewed the patients home medicines and have made adjustments as needed  Test Considered: No indication for CT imaging at this time.  Doubt acute PE, sepsis, intra-abdominal or intrathoracic surgical emergency or infection.  No indication for MRI of the hip.  After the interventions noted above, I reevaluated the patient and found that they have: improved   Disposition:  After consideration of the diagnostic results and the patient's response to treatment, I feel that the patient would benefit from close outpatient follow-up      Final diagnoses:  Hyperglycemia  Left hip pain    ED Discharge Orders     None          Taran Hable, Donnice PARAS, MD 05/17/24 1517

## 2024-05-17 NOTE — ED Notes (Signed)
 EDP attempting IV US 

## 2024-05-17 NOTE — ED Notes (Signed)
 Patient is very hard stick. Will attempt to use IV US  for access.

## 2024-05-17 NOTE — ED Notes (Signed)
Pt wheeled to waiting room by daughter. Pt verbalized understanding of discharge instructions.

## 2024-05-18 LAB — HEMOGLOBIN A1C
Hgb A1c MFr Bld: 11.2 % — ABNORMAL HIGH (ref 4.8–5.6)
Mean Plasma Glucose: 275 mg/dL

## 2024-07-16 ENCOUNTER — Observation Stay (HOSPITAL_BASED_OUTPATIENT_CLINIC_OR_DEPARTMENT_OTHER)
Admission: EM | Admit: 2024-07-16 | Discharge: 2024-07-17 | Discharge status: Against Medical Advice | Attending: Internal Medicine | Admitting: Internal Medicine

## 2024-07-16 ENCOUNTER — Other Ambulatory Visit: Payer: Self-pay

## 2024-07-16 ENCOUNTER — Emergency Department (HOSPITAL_BASED_OUTPATIENT_CLINIC_OR_DEPARTMENT_OTHER)

## 2024-07-16 ENCOUNTER — Encounter (HOSPITAL_BASED_OUTPATIENT_CLINIC_OR_DEPARTMENT_OTHER): Payer: Self-pay | Admitting: *Deleted

## 2024-07-16 DIAGNOSIS — F1290 Cannabis use, unspecified, uncomplicated: Secondary | ICD-10-CM | POA: Insufficient documentation

## 2024-07-16 DIAGNOSIS — Z794 Long term (current) use of insulin: Secondary | ICD-10-CM | POA: Insufficient documentation

## 2024-07-16 DIAGNOSIS — R339 Retention of urine, unspecified: Secondary | ICD-10-CM | POA: Diagnosis not present

## 2024-07-16 DIAGNOSIS — R739 Hyperglycemia, unspecified: Principal | ICD-10-CM

## 2024-07-16 DIAGNOSIS — E1065 Type 1 diabetes mellitus with hyperglycemia: Secondary | ICD-10-CM | POA: Insufficient documentation

## 2024-07-16 DIAGNOSIS — F418 Other specified anxiety disorders: Secondary | ICD-10-CM | POA: Diagnosis present

## 2024-07-16 DIAGNOSIS — L299 Pruritus, unspecified: Secondary | ICD-10-CM | POA: Insufficient documentation

## 2024-07-16 DIAGNOSIS — E1010 Type 1 diabetes mellitus with ketoacidosis without coma: Principal | ICD-10-CM | POA: Diagnosis present

## 2024-07-16 DIAGNOSIS — R338 Other retention of urine: Secondary | ICD-10-CM

## 2024-07-16 DIAGNOSIS — Z87891 Personal history of nicotine dependence: Secondary | ICD-10-CM | POA: Diagnosis not present

## 2024-07-16 DIAGNOSIS — R252 Cramp and spasm: Secondary | ICD-10-CM | POA: Diagnosis not present

## 2024-07-16 DIAGNOSIS — F419 Anxiety disorder, unspecified: Secondary | ICD-10-CM | POA: Insufficient documentation

## 2024-07-16 DIAGNOSIS — R030 Elevated blood-pressure reading, without diagnosis of hypertension: Secondary | ICD-10-CM | POA: Insufficient documentation

## 2024-07-16 DIAGNOSIS — R112 Nausea with vomiting, unspecified: Secondary | ICD-10-CM

## 2024-07-16 DIAGNOSIS — I4711 Inappropriate sinus tachycardia, so stated: Secondary | ICD-10-CM | POA: Diagnosis not present

## 2024-07-16 DIAGNOSIS — R1310 Dysphagia, unspecified: Secondary | ICD-10-CM | POA: Diagnosis not present

## 2024-07-16 DIAGNOSIS — F32A Depression, unspecified: Secondary | ICD-10-CM | POA: Diagnosis not present

## 2024-07-16 DIAGNOSIS — E1110 Type 2 diabetes mellitus with ketoacidosis without coma: Secondary | ICD-10-CM | POA: Diagnosis present

## 2024-07-16 LAB — CBG MONITORING, ED
Glucose-Capillary: >600 mg/dL (ref 70–99)
Glucose-Capillary: 460 mg/dL — ABNORMAL HIGH (ref 70–99)
Glucose-Capillary: 544 mg/dL (ref 70–99)
Glucose-Capillary: 278 mg/dL — ABNORMAL HIGH (ref 70–99)

## 2024-07-16 LAB — MRSA NEXT GEN BY PCR, NASAL: MRSA by PCR Next Gen: NOT DETECTED

## 2024-07-16 LAB — URINE DRUG SCREEN
Amphetamines: POSITIVE — AB
Barbiturates: NEGATIVE
Benzodiazepines: POSITIVE — AB
Cocaine: NEGATIVE
Fentanyl: NEGATIVE
Methadone Scn, Ur: NEGATIVE
Opiates: NEGATIVE
Tetrahydrocannabinol: POSITIVE — AB

## 2024-07-16 LAB — URINALYSIS, ROUTINE W REFLEX MICROSCOPIC
Bilirubin Urine: NEGATIVE
Glucose, UA: >=500 mg/dL — AB
Ketones, ur: NEGATIVE mg/dL
Leukocytes,Ua: NEGATIVE
Nitrite: NEGATIVE
Protein, ur: 30 mg/dL — AB
Specific Gravity, Urine: <=1.005 (ref 1.005–1.030)
pH: 5.5 (ref 5.0–8.0)

## 2024-07-16 LAB — CBC
HCT: 39.2 % (ref 36.0–46.0)
Hemoglobin: 13.2 g/dL (ref 12.0–15.0)
MCH: 28 pg (ref 26.0–34.0)
MCHC: 33.7 g/dL (ref 30.0–36.0)
MCV: 83.2 fL (ref 80.0–100.0)
Platelets: 200 K/uL (ref 150–400)
RBC: 4.71 MIL/uL (ref 3.87–5.11)
RDW: 13 % (ref 11.5–15.5)
WBC: 7.1 K/uL (ref 4.0–10.5)
nRBC: 0 % (ref 0.0–0.2)

## 2024-07-16 LAB — COMPREHENSIVE METABOLIC PANEL WITH GFR
ALT: 15 U/L (ref 0–44)
AST: 40 U/L (ref 15–41)
Albumin: 3.9 g/dL (ref 3.5–5.0)
Alkaline Phosphatase: 92 U/L (ref 38–126)
Anion gap: 16 — ABNORMAL HIGH (ref 5–15)
BUN: 19 mg/dL (ref 6–20)
CO2: 18 mmol/L — ABNORMAL LOW (ref 22–32)
Calcium: 9.2 mg/dL (ref 8.9–10.3)
Chloride: 94 mmol/L — ABNORMAL LOW (ref 98–111)
Creatinine, Ser: 1.17 mg/dL — ABNORMAL HIGH (ref 0.44–1.00)
GFR, Estimated: >60 mL/min (ref >60)
Glucose, Bld: 692 mg/dL (ref 70–99)
Potassium: 4.2 mmol/L (ref 3.5–5.1)
Sodium: 128 mmol/L — ABNORMAL LOW (ref 135–145)
Total Bilirubin: 0.3 mg/dL (ref 0.0–1.2)
Total Protein: 7.3 g/dL (ref 6.5–8.1)

## 2024-07-16 LAB — I-STAT VENOUS BLOOD GAS, ED
Acid-Base Excess: 1 mmol/L (ref 0.0–2.0)
Bicarbonate: 26.6 mmol/L (ref 20.0–28.0)
Calcium, Ion: 1.16 mmol/L (ref 1.15–1.40)
HCT: 41 % (ref 36.0–46.0)
Hemoglobin: 13.9 g/dL (ref 12.0–15.0)
O2 Saturation: 77 %
Potassium: 4.5 mmol/L (ref 3.5–5.1)
Sodium: 129 mmol/L — ABNORMAL LOW (ref 135–145)
TCO2: 28 mmol/L (ref 22–32)
pCO2, Ven: 47.2 mmHg (ref 44–60)
pH, Ven: 7.36 (ref 7.25–7.43)
pO2, Ven: 44 mmHg (ref 32–45)

## 2024-07-16 LAB — GLUCOSE, CAPILLARY
Glucose-Capillary: 231 mg/dL — ABNORMAL HIGH (ref 70–99)
Glucose-Capillary: 130 mg/dL — ABNORMAL HIGH (ref 70–99)
Glucose-Capillary: 132 mg/dL — ABNORMAL HIGH (ref 70–99)

## 2024-07-16 LAB — PREGNANCY, URINE: Preg Test, Ur: NEGATIVE

## 2024-07-16 LAB — URINALYSIS, MICROSCOPIC (REFLEX)

## 2024-07-16 LAB — LIPASE, BLOOD: Lipase: 61 U/L — ABNORMAL HIGH (ref 11–51)

## 2024-07-16 LAB — BETA-HYDROXYBUTYRIC ACID: Beta-Hydroxybutyric Acid: 0.12 mmol/L (ref 0.05–0.27)

## 2024-07-16 MED ORDER — ENOXAPARIN SODIUM 40 MG/0.4ML IJ SOSY
40.0000 mg | PREFILLED_SYRINGE | INTRAMUSCULAR | Status: DC
  Administered 2024-07-16: 40 mg via SUBCUTANEOUS
  Filled 2024-07-16: qty 0.4

## 2024-07-16 MED ORDER — MORPHINE SULFATE (PF) 4 MG/ML IV SOLN
4.0000 mg | Freq: Once | INTRAVENOUS | Status: AC
  Administered 2024-07-16: 4 mg via INTRAVENOUS
  Filled 2024-07-16: qty 1

## 2024-07-16 MED ORDER — SODIUM CHLORIDE 0.9 % IV BOLUS
1000.0000 mL | Freq: Once | INTRAVENOUS | Status: AC
  Administered 2024-07-16: 1000 mL via INTRAVENOUS

## 2024-07-16 MED ORDER — DIPHENHYDRAMINE HCL 50 MG/ML IJ SOLN
12.5000 mg | Freq: Once | INTRAMUSCULAR | Status: AC
  Administered 2024-07-16: 12.5 mg via INTRAVENOUS
  Filled 2024-07-16: qty 1

## 2024-07-16 MED ORDER — DIAZEPAM 5 MG PO TABS: 10.0000 mg | ORAL_TABLET | Freq: Every day | ORAL | Status: DC | PRN

## 2024-07-16 MED ORDER — CHLORHEXIDINE GLUCONATE CLOTH 2 % EX PADS
6.0000 | MEDICATED_PAD | Freq: Every day | CUTANEOUS | Status: DC
  Administered 2024-07-16 – 2024-07-17 (×2): 6 via TOPICAL

## 2024-07-16 MED ORDER — SODIUM CHLORIDE 0.9 % IV SOLN: INTRAVENOUS | Status: DC

## 2024-07-16 MED ORDER — METOPROLOL TARTRATE 5 MG/5ML IV SOLN: 2.5000 mg | Freq: Four times a day (QID) | INTRAVENOUS | Status: DC | PRN

## 2024-07-16 MED ORDER — INSULIN REGULAR(HUMAN) IN NACL 100-0.9 UT/100ML-% IV SOLN
INTRAVENOUS | Status: DC
  Administered 2024-07-16: 0.8 [IU]/h via INTRAVENOUS

## 2024-07-16 MED ORDER — SODIUM CHLORIDE 0.9% FLUSH
3.0000 mL | Freq: Two times a day (BID) | INTRAVENOUS | Status: DC
  Administered 2024-07-16: 3 mL via INTRAVENOUS

## 2024-07-16 MED ORDER — METOPROLOL TARTRATE 5 MG/5ML IV SOLN
5.0000 mg | Freq: Four times a day (QID) | INTRAVENOUS | Status: DC | PRN
  Administered 2024-07-16: 5 mg via INTRAVENOUS
  Filled 2024-07-16: qty 5

## 2024-07-16 MED ORDER — SODIUM CHLORIDE 0.9% FLUSH
3.0000 mL | INTRAVENOUS | Status: DC | PRN
Start: 2024-07-16 — End: 2024-07-17

## 2024-07-16 MED ORDER — ACETAMINOPHEN 325 MG PO TABS: 650.0000 mg | ORAL_TABLET | Freq: Four times a day (QID) | ORAL | Status: DC | PRN

## 2024-07-16 MED ORDER — HYDROMORPHONE HCL 1 MG/ML IJ SOLN
0.5000 mg | Freq: Once | INTRAMUSCULAR | Status: AC
  Administered 2024-07-16: 0.5 mg via INTRAVENOUS
  Filled 2024-07-16: qty 1

## 2024-07-16 MED ORDER — INSULIN REGULAR(HUMAN) IN NACL 100-0.9 UT/100ML-% IV SOLN
INTRAVENOUS | Status: DC
  Administered 2024-07-16: 10.5 [IU]/h via INTRAVENOUS
  Filled 2024-07-16: qty 100

## 2024-07-16 MED ORDER — PROMETHAZINE HCL 25 MG/ML IJ SOLN
INTRAMUSCULAR | Status: AC
  Filled 2024-07-16: qty 1

## 2024-07-16 MED ORDER — LORAZEPAM 2 MG/ML IJ SOLN
2.0000 mg | Freq: Once | INTRAMUSCULAR | Status: AC
  Administered 2024-07-16: 2 mg via INTRAVENOUS
  Filled 2024-07-16: qty 1

## 2024-07-16 MED ORDER — LORAZEPAM 2 MG/ML IJ SOLN
1.0000 mg | Freq: Four times a day (QID) | INTRAMUSCULAR | Status: DC | PRN
  Administered 2024-07-16 – 2024-07-17 (×2): 1 mg via INTRAVENOUS
  Filled 2024-07-16 (×2): qty 1

## 2024-07-16 MED ORDER — SODIUM CHLORIDE 0.9% FLUSH
3.0000 mL | Freq: Two times a day (BID) | INTRAVENOUS | Status: DC
  Administered 2024-07-16 – 2024-07-17 (×2): 3 mL via INTRAVENOUS

## 2024-07-16 MED ORDER — SODIUM CHLORIDE 0.9 % IV BOLUS
1000.0000 mL | Freq: Once | INTRAVENOUS | Status: DC
Start: 2024-07-16 — End: 2024-07-17

## 2024-07-16 MED ORDER — BUSPIRONE HCL 5 MG PO TABS
15.0000 mg | ORAL_TABLET | Freq: Three times a day (TID) | ORAL | Status: DC
  Filled 2024-07-16: qty 1

## 2024-07-16 MED ORDER — TRIMETHOBENZAMIDE HCL 100 MG/ML IM SOLN
200.0000 mg | Freq: Four times a day (QID) | INTRAMUSCULAR | Status: DC | PRN
  Administered 2024-07-16 – 2024-07-17 (×2): 200 mg via INTRAMUSCULAR
  Filled 2024-07-16 (×4): qty 2

## 2024-07-16 MED ORDER — LORAZEPAM 0.5 MG PO TABS
0.5000 mg | ORAL_TABLET | Freq: Four times a day (QID) | ORAL | Status: DC | PRN
  Filled 2024-07-16: qty 1

## 2024-07-16 MED ORDER — DEXTROSE-SODIUM CHLORIDE 5-0.45 % IV SOLN: INTRAVENOUS | Status: DC

## 2024-07-16 MED ORDER — SODIUM CHLORIDE 0.9 % IV SOLN
250.0000 mL | INTRAVENOUS | Status: DC | PRN
Start: 2024-07-16 — End: 2024-07-17

## 2024-07-16 MED ORDER — ACETAMINOPHEN 650 MG RE SUPP: 650.0000 mg | Freq: Four times a day (QID) | RECTAL | Status: DC | PRN

## 2024-07-16 MED ORDER — FENTANYL CITRATE (PF) 50 MCG/ML IJ SOSY
50.0000 ug | PREFILLED_SYRINGE | Freq: Once | INTRAMUSCULAR | Status: DC
  Filled 2024-07-16: qty 1

## 2024-07-16 MED ORDER — ORAL CARE MOUTH RINSE: 15.0000 mL | OROMUCOSAL | Status: DC | PRN

## 2024-07-16 MED ORDER — SODIUM CHLORIDE 0.9 % IV SOLN
12.5000 mg | Freq: Once | INTRAVENOUS | Status: AC
  Administered 2024-07-16: 12.5 mg via INTRAVENOUS
  Filled 2024-07-16: qty 0.5

## 2024-07-16 MED ORDER — DEXTROSE 50 % IV SOLN: 0.0000 mL | INTRAVENOUS | Status: DC | PRN

## 2024-07-16 MED ORDER — DIPHENHYDRAMINE HCL 50 MG/ML IJ SOLN
25.0000 mg | Freq: Four times a day (QID) | INTRAMUSCULAR | Status: DC | PRN
  Administered 2024-07-16 – 2024-07-17 (×2): 25 mg via INTRAVENOUS
  Filled 2024-07-16 (×2): qty 1

## 2024-07-16 NOTE — ED Notes (Signed)
 Went to get meds and  came back and  pt had pulled out her last IV in left forarm

## 2024-07-16 NOTE — ED Notes (Signed)
Report given to [facility] transport team. 

## 2024-07-16 NOTE — ED Notes (Signed)
[facility] called for transport to [facility] 
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
## 2024-07-16 NOTE — ED Notes (Signed)
 No change inn insulin  drip

## 2024-07-16 NOTE — ED Notes (Signed)
 Attempt IV placement in left anterior wrist without success.  One green top sent.

## 2024-07-16 NOTE — ED Notes (Addendum)
 Upon assuming care of patient, went to room for rounding and for IV pump alarming. Insulin  gtt rate at 10.5 units/hour paused for phenergan  infusion. Phenergan  infusion was complete, assessed patients CBG was 278, restarted insulin  gtt at 2.2 units/hour.

## 2024-07-16 NOTE — Plan of Care (Signed)
 Discussed with patient plan of care for the evening, pain management and admission questions with some teach back displayed.  Patient had foley placed due to bladder scan of greater than 1 liter.  Problem: Education: Goal: Knowledge of General Education information will improve Description: Including pain rating scale, medication(s)/side effects and non-pharmacologic comfort measures Outcome: Progressing   Problem: Health Behavior/Discharge Planning: Goal: Ability to manage health-related needs will improve Outcome: Not Progressing   Problem: Elimination: Goal: Will not experience complications related to urinary retention Outcome: Not Progressing   Problem: Pain Managment: Goal: General experience of comfort will improve and/or be controlled Outcome: Not Progressing

## 2024-07-16 NOTE — H&P (Addendum)
 History and Physical    Julie [name] FMW:[id number] DOB: 1990-12-18 DOA: 07/16/2024  PCP: Sofie Lynwood ORN, PA-C   Patient coming from: Home   Chief Complaint:  Chief Complaint  Patient presents with   Hyperglycemia   ED TRIAGE note:  Pt is here for high blood sugar since last Monday.  Pt has dexcom and it is reading high pt has insulin  pump in place.  Pt reports feeling thirsty and has pain and burning with urination and has muscle cramps.        HPI:  Julie [name] is a 33 y.o. female with medical history significant of insulin -dependent DM type I, chronic pleuritis, dysphagia, PTSD, anxiety and depression presented to emergency department complaining of elevated blood glucose for several days on Dexcom reading also patient has insulin  pump cramping in her hands, nausea vomiting and loose stool. Patient also using marijuana benzo and amphetamine as well. Denies any fever, chill, upper respiratory again UTI symptoms. Patient reported generalized allover muscle cramping and pain.  She is also endorsing generalized itchiness as well.  ED Course:  At presentation to ED patient found tachycardic, borderline hypertensive and tachypneic.  EKG showed sinus tachycardia heart rate 100 mg. Lab, POC blood glucose above 600. UA no evidence of UTI.  Pregnancy test negative.  CMP showing corrected sodium 142 with blood glucose of 692, low chloride 94 elevated creatinine 1.17, low bicarb 18 and elevated anion gap 16..  Normal hepatic function panel and elevated lipase 61. Normal beta-hydroxybutyrate level. VBG no evidence of acidosis CBC unremarkable.  In the ED patient received 1 L of NS bolus and being started on insulin  drip.  Has been transferred [facility] hospital for management of DKA, AKI and intractable nausea vomiting in the setting of marijuana use. Patient's RN reported that patient has difficulty voiding and bladder scan showing 1 L of  urinary tension.   Significant labs in the ED: Lab Orders         MRSA Next Gen by PCR, Nasal         CBC         Urinalysis, Routine w reflex microscopic -Urine, Clean Catch         Pregnancy, urine         Comprehensive metabolic panel         Lipase, blood         Beta-hydroxybutyric acid         Urine Drug Screen         Urinalysis, Microscopic (reflex)         Glucose, capillary         Basic metabolic panel         CBC         CBG monitoring, ED         I-Stat venous blood gas, ([facility] ED, [facility], [facility])         CBG monitoring, ED         CBG monitoring, ED         CBG monitoring, ED       Review of Systems:  Review of Systems  Constitutional:  Negative for chills, fever and weight loss.  Respiratory:  Negative for cough, sputum production and shortness of breath.   Cardiovascular:  Negative for chest pain and palpitations.  Gastrointestinal:  Positive for nausea and vomiting. Negative for abdominal pain, diarrhea and heartburn.  Genitourinary:  Negative for dysuria, frequency, hematuria and urgency.  Neurological:  Negative for  dizziness and headaches.  Psychiatric/Behavioral:  The patient is not nervous/anxious.     Past Medical History:  Diagnosis Date   Bulimia nervosa (HCC)    Dental caries    Diabetes mellitus without complication (HCC)    Genital warts    Noncompliance with diabetes treatment    Pelvic fracture [facility])     Past Surgical History:  Procedure Laterality Date   BIOPSY  05/30/2021   Procedure: BIOPSY;  Surgeon: Charlanne Groom, MD;  Location: [facility] ENDOSCOPY;  Service: Endoscopy;;   CESAREAN SECTION     CESAREAN SECTION  2012   ESOPHAGOGASTRODUODENOSCOPY (EGD) WITH PROPOFOL  N/A 05/30/2021   Procedure: ESOPHAGOGASTRODUODENOSCOPY (EGD) WITH PROPOFOL ;  Surgeon: Charlanne Groom, MD;  Location: [facility] ENDOSCOPY;  Service: Endoscopy;  Laterality: N/A;     reports that she has quit smoking. Her smoking use included cigarettes. She has never used smokeless tobacco.  She reports that she does not currently use drugs. She reports that she does not drink alcohol.  Allergies  Allergen Reactions   Chloroprocaine Anaphylaxis   Iodinated Contrast Media Itching   Lidocaine  Anaphylaxis   Olanzapine Itching, Nausea And Vomiting, Nausea Only and Other (See Comments)   Ondansetron  Hcl Itching   Penicillins Itching    Has tolerated zosyn   Prochlorperazine Anaphylaxis, Swelling and Anxiety   Sulfa  Antibiotics Hives   Toradol  [Ketorolac  Tromethamine ] Hives   Metoclopramide  Other (See Comments)    Cramps     Tramadol Nausea And Vomiting   Celebrex [Celecoxib] Diarrhea   Hydroxyzine  Other (See Comments)    Makes her more anxious     Family History  Problem Relation Age of Onset   Diabetes Mother     Prior to Admission medications   Medication Sig Start Date End Date Taking? Authorizing Provider  busPIRone  (BUSPAR ) 15 MG tablet Take 15 mg by mouth 3 (three) times daily. 07/02/24  Yes [provider]  clobetasol (TEMOVATE) 0.05 % external solution Apply 1 Application topically 2 (two) times daily. 07/02/24 07/02/25 Yes [provider]  Continuous Glucose Transmitter (DEXCOM G6 TRANSMITTER) MISC 1 each by Does not apply route every 3 (three) months. 07/04/24  Yes [provider]  cyclobenzaprine  (FLEXERIL ) 10 MG tablet Take 5 mg by mouth 2 (two) times daily. *morning and at noon* 03/22/24  Yes [provider]  nortriptyline (PAMELOR) 10 MG capsule Take by mouth. 05/22/24  Yes [provider]  Continuous Glucose Sensor (DEXCOM G6 SENSOR) MISC Place 1 each onto the skin See admin instructions. Change sensor every 10 days 12/13/23 12/12/24  [provider]  diazepam  (VALIUM ) 10 MG tablet Take 10 mg by mouth daily as needed for anxiety. 08/31/23 07/16/24  [provider]  insulin  aspart (NOVOLOG  FLEXPEN) 100 UNIT/ML FlexPen Inject 12 Units into the skin 3 (three) times daily with meals. Patient taking  differently: Inject 1.2 Units into the skin See admin instructions. 1.2 units via pump every 1 hour 04/10/17   Patsey Lot, MD  Insulin  Disposable Pump (OMNIPOD 5 DEXG7G6 PODS GEN 5) MISC Inject 1 Device into the skin every 3 (three) days.    [provider]  NOVOLOG  100 UNIT/ML injection SMARTSIG:80 Unit(s)    [provider]  prazosin  (MINIPRESS ) 2 MG capsule Take 1 capsule (2 mg total) by mouth at bedtime as needed (sleep). 02/13/24   Caleen Burgess BROCKS, MD  promethazine  (PHENERGAN ) 25 MG suppository Place 1 suppository (25 mg total) rectally every 6 (six) hours as needed for nausea or vomiting. 02/13/24  Caleen Burgess BROCKS, MD     Physical Exam: Vitals:   07/16/24 1527 07/16/24 1700 07/16/24 1927 07/16/24 1928  BP: (!) 161/86 (!) 143/102 (!) 153/97 (!) 153/97  Pulse: (!) 131 (!) 116 (!) 109 (!) 109  Resp: 20 20 (!) 23 (!) 23  Temp: 98.6 F (37 C)  98.5 F (36.9 C) 98.5 F (36.9 C)  TempSrc: Oral  Oral Oral  SpO2: 98% 99% 99% 99%    Physical Exam Constitutional:      General: She is not in acute distress.    Appearance: Normal appearance. She is not ill-appearing.  HENT:     Mouth/Throat:     Mouth: Mucous membranes are dry.  Eyes:     Pupils: Pupils are equal, round, and reactive to light.  Cardiovascular:     Rate and Rhythm: Regular rhythm. Tachycardia present.     Pulses: Normal pulses.     Heart sounds: Normal heart sounds.  Pulmonary:     Effort: Pulmonary effort is normal.     Breath sounds: Normal breath sounds.  Abdominal:     Palpations: Abdomen is soft.  Musculoskeletal:     Cervical back: Neck supple.  Skin:    Capillary Refill: Capillary refill takes less than 2 seconds.  Neurological:     Mental Status: She is alert and oriented to person, place, and time.  Psychiatric:        Mood and Affect: Mood normal.      Labs on Admission: I have personally reviewed following labs and imaging studies  CBC: Recent Labs  Lab 07/16/24 1557  07/16/24 1605  WBC 7.1  --   HGB 13.2 13.9  HCT 39.2 41.0  MCV 83.2  --   PLT 200  --    Basic Metabolic Panel: Recent Labs  Lab 07/16/24 1530 07/16/24 1605  NA 128* 129*  K 4.2 4.5  CL 94*  --   CO2 18*  --   GLUCOSE 692*  --   BUN 19  --   CREATININE 1.17*  --   CALCIUM 9.2  --    GFR: CrCl cannot be calculated (Unknown ideal weight.). Liver Function Tests: Recent Labs  Lab 07/16/24 1530  AST 40  ALT 15  ALKPHOS 92  BILITOT 0.3  PROT 7.3  ALBUMIN 3.9   Recent Labs  Lab 07/16/24 1557  LIPASE 61*   No results for input(s): AMMONIA in the last 168 hours. Coagulation Profile: No results for input(s): INR, PROTIME in the last 168 hours. Cardiac Enzymes: No results for input(s): CKTOTAL, CKMB, CKMBINDEX, TROPONINI, TROPONINIHS in the last 168 hours. BNP (last 3 results) No results for input(s): BNP in the last 8760 hours. HbA1C: No results for input(s): HGBA1C in the last 72 hours. CBG: Recent Labs  Lab 07/16/24 1523 07/16/24 1747 07/16/24 1817 07/16/24 1926 07/16/24 2049  GLUCAP >600* 544* 460* 278* 231*   Lipid Profile: No results for input(s): CHOL, HDL, LDLCALC, TRIG, CHOLHDL, LDLDIRECT in the last 72 hours. Thyroid Function Tests: No results for input(s): TSH, T4TOTAL, FREET4, T3FREE, THYROIDAB in the last 72 hours. Anemia Panel: No results for input(s): VITAMINB12, FOLATE, FERRITIN, TIBC, IRON, RETICCTPCT in the last 72 hours. Urine analysis:    Component Value Date/Time   COLORURINE YELLOW 07/16/2024 1524   APPEARANCEUR HAZY (A) 07/16/2024 1524   LABSPEC <=1.005 07/16/2024 1524   PHURINE 5.5 07/16/2024 1524   GLUCOSEU >=500 (A) 07/16/2024 1524   HGBUR TRACE (A) 07/16/2024 1524   BILIRUBINUR  NEGATIVE 07/16/2024 1524   KETONESUR NEGATIVE 07/16/2024 1524   PROTEINUR 30 (A) 07/16/2024 1524   UROBILINOGEN 0.2 02/04/2015 1125   NITRITE NEGATIVE 07/16/2024 1524   LEUKOCYTESUR NEGATIVE  07/16/2024 1524    Radiological Exams on Admission: I have personally reviewed images DG Chest Portable 1 View Result Date: 07/16/2024 CLINICAL DATA:  High blood sugar with pain and burning with urination. EXAM: PORTABLE CHEST 1 VIEW COMPARISON:  Jan 28, 2024 FINDINGS: The heart size and mediastinal contours are within normal limits. Both lungs are clear. The visualized skeletal structures are unremarkable. IMPRESSION: No active disease. Electronically Signed   By: Suzen Dials M.D.   On: 07/16/2024 18:06     EKG: My personal interpretation of EKG shows: Sinus tachycardia heart rate 1 1    Assessment/Plan: Principal Problem:   DKA (diabetic ketoacidosis) (HCC) Active Problems:   DM ketoacidosis type I (HCC)   Depression with anxiety   Dysphagia    Assessment and Plan: Early development of DKA type I History of insulin -dependent DM type I A1c 11.2 last checked in August 2025 -Presented to emergency department complaining of poor appetite nausea vomiting since Monday and found to have elevated blood glucose on Dexcom.  Patient unclear if the insulin  pump is working or not. -At presentation to ED patient found tachycardic, borderline hypertensive and tachypneic.  EKG showed sinus tachycardia heart rate 100 mg. Lab, POC blood glucose above 600. UA no evidence of UTI.  Pregnancy test negative. -CMP showing corrected sodium 142 with blood glucose of 692, low chloride 94 elevated creatinine 1.17, low bicarb 18 and elevated anion gap 16..  Normal hepatic function panel and elevated lipase 61. Normal beta-hydroxybutyrate level. VBG no evidence of acidosis CBC unremarkable. -in the ED patient received 1 L of NS bolus and being started on insulin  drip. -Early onset of DKA with mild acidosis.  Continue insulin  drip with DKA protocol.  Continue maintenance fluid NS 125 cc/h and the blood glucose dropped below 250 initiate D5 half-normal saline 125 cc/h - Continue to check BMP every 4  hours to decide transition to subcutaneous insulin . - Keeping patient NPO.  Intractable nausea vomiting in the setting of marijuana use Marijuana smoking Amphetamine use which is not prescribed -Intractable nausea vomiting in the setting of marijuana smoking.  UDS positive with marijuana, amphetamine and benzodiazepine. Benzodiazepine positive as patient is on diazepam  at home.  Unable to find any prescription of amphetamine. -Counsel provided to use marijuana and amphetamine.   Anxiety and depression -Given UDS positive with diazepam  and at home patient takes diazepam  10 mg as needed which is a large dose holding oral diazepam  as of now and continue Ativan  as needed for anxiety control. - Continue  BuSpar  15 mg 3 times daily. Addendum -Patient reported that Tigan is not helping with nausea vomiting requesting for Phenergan  per patient she takes Phenergan  every day at home and reported never she had any allergic reaction with Phenergan  as per her knowledge.  Patient is constantly crying and requesting for pain medications, nausea medications and anxiety medication.  Stating that she has a lot of financial pressure and it is very hard for her to take care of 3 kids. -Concern for substance-induced mood disorder with anxiety and depression.  Consulting psychiatry for further evaluation as well.   Persistent sinus tachycardia in the setting of dehydration amphetamine use Elevated blood pressure in the setting of Biphetamine use - Continue Lopressor  every 6 as needed for persistent tachycardia heart rate above 120  or systolic blood pressure above 839.     Urinary retention - Patient reported that she is unable to emptying her bladder completely.  UA bacteria positive however normal WBC count and negative nitrate and leukocyte esterase rules out UTI - Bladder scan showing around 1 L of urinary retention.  Place Foley catheter   Chronic pruritus -Continue IV Benadryl  as needed   Generalized  body ache and muscle cramp   Patient is crying and requesting for some IV pain medication.  Reported that she is allergic to Toradol  that keeps her tremor and anxiety. - Concern for pain medication seeking behavior.  Giving just one-time dose of IV Dilaudid  0.5 mg.  Dysphagia Patient reported she has chronic swallowing difficulty that she cannot swallow medication and requesting for IV Ativan . -Patient has EGD 06/09/2021 which showed no evidence of a stricture.  Normal proximal mid and distal esophagus. -Consulting speech for evaluation of swallow as well.  Concern for patient has IV pain medication and IV Ativan  seeking behavior.   DVT prophylaxis:  Lovenox  Code Status:  Full Code Diet: Currently n.p.o. while on insulin  drip Family Communication:   Family was present at bedside, at the time of interview. Opportunity was given to ask question and all questions were answered satisfactorily.  Disposition Plan: Continue monitor improvement of DKA Consults: Diabetic educator Admission status:   Inpatient, Step Down Unit  Severity of Illness: The appropriate patient status for this patient is INPATIENT. Inpatient status is judged to be reasonable and necessary in order to provide the required intensity of service to ensure the patient's safety. The patient's presenting symptoms, physical exam findings, and initial radiographic and laboratory data in the context of their chronic comorbidities is felt to place them at high risk for further clinical deterioration. Furthermore, it is not anticipated that the patient will be medically stable for discharge from the hospital within 2 midnights of admission.   * I certify that at the point of admission it is my clinical judgment that the patient will require inpatient hospital care spanning beyond 2 midnights from the point of admission due to high intensity of service, high risk for further deterioration and high frequency of surveillance  required.DEWAINE    Shanielle Correll, MD [facility]  How to contact the [facility] Attending or Consulting provider 7A - 7P or covering provider during after hours 7P -7A, for this patient.  Check the care team in [facility] and look for a) attending/consulting [facility] provider listed and b) the [facility] team listed Log into [email or web address] and use [place]'s universal password to access. If you do not have the password, please contact the hospital operator. Locate the [facility] provider you are looking for under [facility] and page to a number that you can be directly reached. If you still have difficulty reaching the provider, please page the [facility] (Director on Call) for the Hospitalists listed on amion for assistance.  07/16/2024, 9:32 PM

## 2024-07-16 NOTE — ED Notes (Signed)
Called [facility] for hospitalist consult

## 2024-07-16 NOTE — ED Provider Notes (Addendum)
 [place] [facility] Provider Note   CSN: [id number] Arrival date & time: 07/16/24  1459     Patient presents with: Hyperglycemia   Julie [name] is a 33 y.o. female.   Has been elevated for the last several days Dexcom is reading high has insulin  pump in place feels thirsty burning urination.  Has history of insulin -dependent diabetes.  Denies any weakness numbness tingling.  Denies any fever or chills.  Denies any abdominal pain.  She has had some cramps in her hands and feet and she has been vomiting and having diarrhea.  She denies any headache neck pain fever.  The history is provided by the patient.       Prior to Admission medications   Medication Sig Start Date End Date Taking? Authorizing Provider  busPIRone  (BUSPAR ) 15 MG tablet Take 15 mg by mouth 3 (three) times daily. 07/02/24  Yes [provider]  clobetasol (TEMOVATE) 0.05 % external solution Apply 1 Application topically 2 (two) times daily. 07/02/24 07/02/25 Yes [provider]  Continuous Glucose Transmitter (DEXCOM G6 TRANSMITTER) MISC 1 each by Does not apply route every 3 (three) months. 07/04/24  Yes [provider]  cyclobenzaprine  (FLEXERIL ) 10 MG tablet Take 5 mg by mouth 2 (two) times daily. *morning and at noon* 03/22/24  Yes [provider]  nortriptyline (PAMELOR) 10 MG capsule Take by mouth. 05/22/24  Yes [provider]  Continuous Glucose Sensor (DEXCOM G6 SENSOR) MISC Place 1 each onto the skin See admin instructions. Change sensor every 10 days 12/13/23 12/12/24  [provider]  diazepam  (VALIUM ) 10 MG tablet Take 10 mg by mouth daily as needed for anxiety. 08/31/23 07/16/24  [provider]  insulin  aspart (NOVOLOG  FLEXPEN) 100 UNIT/ML FlexPen Inject 12 Units into the skin 3 (three) times daily with meals. Patient taking differently: Inject 1.2 Units into the skin See admin instructions. 1.2 units  via pump every 1 hour 04/10/17   Patsey Lot, MD  Insulin  Disposable Pump (OMNIPOD 5 DEXG7G6 PODS GEN 5) MISC Inject 1 Device into the skin every 3 (three) days.    [provider]  NOVOLOG  100 UNIT/ML injection SMARTSIG:80 Unit(s)    [provider]  prazosin  (MINIPRESS ) 2 MG capsule Take 1 capsule (2 mg total) by mouth at bedtime as needed (sleep). 02/13/24   Caleen Burgess BROCKS, MD  promethazine  (PHENERGAN ) 25 MG suppository Place 1 suppository (25 mg total) rectally every 6 (six) hours as needed for nausea or vomiting. 02/13/24   Amin, Ankit C, MD    Allergies: Chloroprocaine, Iodinated contrast media, Lidocaine , Olanzapine, Ondansetron  hcl, Penicillins, Prochlorperazine, Sulfa  antibiotics, Toradol  [ketorolac  tromethamine ], Metoclopramide , Tramadol, Celebrex [celecoxib], and Hydroxyzine     Review of Systems  Updated Vital Signs BP (!) 143/102   Pulse (!) 116   Temp 98.6 F (37 C) (Oral)   Resp 20   SpO2 99%   Physical Exam Vitals and nursing note reviewed.  Constitutional:      General: She is in acute distress.     Appearance: She is well-developed. She is not ill-appearing.  HENT:     Head: Normocephalic and atraumatic.     Mouth/Throat:     Mouth: Mucous membranes are dry.  Eyes:     Extraocular Movements: Extraocular movements intact.     Conjunctiva/sclera: Conjunctivae normal.     Pupils: Pupils are equal, round, and reactive to light.  Cardiovascular:     Rate and Rhythm: Normal rate and regular  rhythm.     Pulses: Normal pulses.     Heart sounds: Normal heart sounds. No murmur heard. Pulmonary:     Effort: Pulmonary effort is normal. No respiratory distress.     Breath sounds: Normal breath sounds.  Abdominal:     Palpations: Abdomen is soft.     Tenderness: There is no abdominal tenderness.  Musculoskeletal:        General: No swelling.     Cervical back: Normal range of motion and neck supple.  Skin:    General: Skin is warm and dry.      Capillary Refill: Capillary refill takes less than 2 seconds.  Neurological:     General: No focal deficit present.     Mental Status: She is alert and oriented to person, place, and time.     Cranial Nerves: No cranial nerve deficit.     Sensory: No sensory deficit.     Motor: No weakness.     Coordination: Coordination normal.  Psychiatric:        Mood and Affect: Mood normal.     (all labs ordered are listed, but only abnormal results are displayed) Labs Reviewed  URINALYSIS, ROUTINE W REFLEX MICROSCOPIC - Abnormal; Notable for the following components:      Result Value   APPearance HAZY (*)    Glucose, UA >=500 (*)    Hgb urine dipstick TRACE (*)    Protein, ur 30 (*)    All other components within normal limits  COMPREHENSIVE METABOLIC PANEL WITH GFR - Abnormal; Notable for the following components:   Sodium 128 (*)    Chloride 94 (*)    CO2 18 (*)    Glucose, Bld 692 (*)    Creatinine, Ser 1.17 (*)    Anion gap 16 (*)    All other components within normal limits  LIPASE, BLOOD - Abnormal; Notable for the following components:   Lipase 61 (*)    All other components within normal limits  URINALYSIS, MICROSCOPIC (REFLEX) - Abnormal; Notable for the following components:   Bacteria, UA FEW (*)    All other components within normal limits  CBG MONITORING, ED - Abnormal; Notable for the following components:   Glucose-Capillary >600 (*)    All other components within normal limits  I-STAT VENOUS BLOOD GAS, ED - Abnormal; Notable for the following components:   Sodium 129 (*)    All other components within normal limits  CBG MONITORING, ED - Abnormal; Notable for the following components:   Glucose-Capillary 544 (*)    All other components within normal limits  CBC  PREGNANCY, URINE  BETA-HYDROXYBUTYRIC ACID  URINE DRUG SCREEN    EKG: EKG Interpretation Date/Time:  Monday July 16 2024 17:30:19 EDT Ventricular Rate:  111 PR Interval:  141 QRS  Duration:  105 QT Interval:  327 QTC Calculation: 445 R Axis:   96  Text Interpretation: Sinus tachycardia Borderline right axis deviation Borderline repol abnormality, diffuse leads Confirmed by Ruthe Cornet [phone]) on 07/16/2024 5:33:47 PM  Radiology: [facility] Chest Portable 1 View Result Date: 07/16/2024 CLINICAL DATA:  High blood sugar with pain and burning with urination. EXAM: PORTABLE CHEST 1 VIEW COMPARISON:  Jan 28, 2024 FINDINGS: The heart size and mediastinal contours are within normal limits. Both lungs are clear. The visualized skeletal structures are unremarkable. IMPRESSION: No active disease. Electronically Signed   By: Suzen Dials M.D.   On: 07/16/2024 18:06     .Critical Care  Performed by: Ruthe,  Quinnley Colasurdo, DO Authorized by: Ruthe Cornet, DO   Critical care provider statement:    Critical care time (minutes):  35   Critical care was necessary to treat or prevent imminent or life-threatening deterioration of the following conditions:  Metabolic crisis   Critical care was time spent personally by me on the following activities:  Blood draw for specimens, development of treatment plan with patient or surrogate, discussions with primary provider, evaluation of patient's response to treatment, examination of patient, obtaining history from patient or surrogate, ordering and performing treatments and interventions, ordering and review of laboratory studies, ordering and review of radiographic studies, pulse oximetry, re-evaluation of patient's condition and review of old charts   Care discussed with: admitting provider      Medications Ordered in the ED  insulin  regular, human (MYXREDLIN ) 100 units/ 100 mL infusion (10.5 Units/hr Intravenous New Bag/Given 07/16/24 1751)  dextrose  50 % solution 0-50 mL (has no administration in time range)  dextrose  5 % and 0.45 % NaCl infusion (has no administration in time range)  0.9 %  sodium chloride  infusion (has no administration in  time range)  promethazine  (PHENERGAN ) 12.5 mg in sodium chloride  0.9 % 50 mL IVPB (has no administration in time range)  sodium chloride  0.9 % bolus 1,000 mL (has no administration in time range)  sodium chloride  0.9 % bolus 1,000 mL (has no administration in time range)  fentaNYL  (SUBLIMAZE ) injection 50 mcg (has no administration in time range)  sodium chloride  0.9 % bolus 1,000 mL (1,000 mLs Intravenous New Bag/Given 07/16/24 1707)  LORazepam  (ATIVAN ) injection 2 mg (2 mg Intravenous Given 07/16/24 1703)  diphenhydrAMINE  (BENADRYL ) injection 12.5 mg (12.5 mg Intravenous Given 07/16/24 1737)                                    Medical Decision Making Amount and/or Complexity of Data Reviewed Labs: ordered. Radiology: ordered.  Risk Prescription drug management. Decision regarding hospitalization.   Djuana Pates is here with elevated blood sugar muscle spasms nausea vomiting diarrhea.  Patient tachycardic but otherwise unremarkable.  She is having spasms in her hands and feet at times.  Differential diagnosis likely DKA versus hyperglycemia causing electrolyte abnormalities.  Could be UTI.  She is not having any abdominal pain.  She has a history of insulin -dependent diabetes.  She has insulin  pump but Dexcom's are reading high for about 5 or 6 days but she tried to treat herself at home.  She is unable to tolerate anything by mouth.  She does admit to [facility] use.  Will give IV fluids IV Ativan  EKG check labs and evaluate for DKA/hyperglycemia.  Overall pH is 7.36 bicarb 26 but blood sugar nearly 700.  Will start her on insulin  protocol with insulin  infusion.  Bicarb on CMP is 18.  Anion gap is 16.  Does not appear to be ketones in the urine.  Ultimately she is severely symptomatic muscle spasms nausea and vomiting.  I think that this is intractable nausea and vomiting possibly from gastroparesis or maybe THC use.  She does not quite meet DKA criteria but blood sugars are extremely  high.  Given multiple drug allergies I have started with IV Ativan  to help with nausea and spasms that she is having.  Fluid boluses going insulin  protocol ordered.  Will admit for further hydration and care of her metabolic derangements.  Overall multiple attempts at IV.  I got  2 separate IV ultrasounds that worked for short amount of time.  Ultimately we were able to get a IV in her right foot.  My hope is that she completes her IV fluid hydration and will get easier to get ultrasounds.  She has been able to get a full bolus to peripheral IV I had first place.  She did not have any easy EJ's.  Currently she is getting her remaining 2 fluid bolus and insulin  through her right foot IV which is working very well.  My hope is that eventually we can get an ultrasound and PE/IV team at [facility] or [facility] can.  Blood sugars improving down to 278.  This chart was dictated using voice recognition software.  Despite best efforts to proofread,  errors can occur which can change the documentation meaning.      Final diagnoses:  Hyperglycemia  Intractable nausea and vomiting    ED Discharge Orders     None          Ruthe Cornet, DO 07/16/24 1810    Ruthe Cornet, DO 07/16/24 1949

## 2024-07-16 NOTE — ED Notes (Signed)
 Insulin  has been paused to give phenergan  IV

## 2024-07-16 NOTE — ED Triage Notes (Signed)
 Pt is here for high blood sugar since last Monday.  Pt has dexcom and it is reading high pt has insulin  pump in place.  Pt reports feeling thirsty and has pain and burning with urination and has muscle cramps.

## 2024-07-17 DIAGNOSIS — F1914 Other psychoactive substance abuse with psychoactive substance-induced mood disorder: Secondary | ICD-10-CM

## 2024-07-17 DIAGNOSIS — E1010 Type 1 diabetes mellitus with ketoacidosis without coma: Secondary | ICD-10-CM | POA: Diagnosis not present

## 2024-07-17 DIAGNOSIS — R112 Nausea with vomiting, unspecified: Secondary | ICD-10-CM

## 2024-07-17 DIAGNOSIS — R739 Hyperglycemia, unspecified: Principal | ICD-10-CM

## 2024-07-17 DIAGNOSIS — F418 Other specified anxiety disorders: Secondary | ICD-10-CM | POA: Diagnosis not present

## 2024-07-17 LAB — CBC
HCT: 39.2 % (ref 36.0–46.0)
Hemoglobin: 12.8 g/dL (ref 12.0–15.0)
MCH: 28.1 pg (ref 26.0–34.0)
MCHC: 32.7 g/dL (ref 30.0–36.0)
MCV: 86 fL (ref 80.0–100.0)
Platelets: 218 K/uL (ref 150–400)
RBC: 4.56 MIL/uL (ref 3.87–5.11)
RDW: 13.1 % (ref 11.5–15.5)
WBC: 9.1 K/uL (ref 4.0–10.5)
nRBC: 0 % (ref 0.0–0.2)

## 2024-07-17 LAB — GLUCOSE, CAPILLARY
Glucose-Capillary: 137 mg/dL — ABNORMAL HIGH (ref 70–99)
Glucose-Capillary: 164 mg/dL — ABNORMAL HIGH (ref 70–99)
Glucose-Capillary: 171 mg/dL — ABNORMAL HIGH (ref 70–99)
Glucose-Capillary: 182 mg/dL — ABNORMAL HIGH (ref 70–99)
Glucose-Capillary: 203 mg/dL — ABNORMAL HIGH (ref 70–99)
Glucose-Capillary: 218 mg/dL — ABNORMAL HIGH (ref 70–99)
Glucose-Capillary: 227 mg/dL — ABNORMAL HIGH (ref 70–99)
Glucose-Capillary: 228 mg/dL — ABNORMAL HIGH (ref 70–99)
Glucose-Capillary: 244 mg/dL — ABNORMAL HIGH (ref 70–99)
Glucose-Capillary: 281 mg/dL — ABNORMAL HIGH (ref 70–99)

## 2024-07-17 LAB — BASIC METABOLIC PANEL WITH GFR
Anion gap: 8 (ref 5–15)
Anion gap: 9 (ref 5–15)
BUN: 12 mg/dL (ref 6–20)
BUN: 10 mg/dL (ref 6–20)
CO2: 24 mmol/L (ref 22–32)
CO2: 24 mmol/L (ref 22–32)
Calcium: 8.5 mg/dL — ABNORMAL LOW (ref 8.9–10.3)
Calcium: 8.6 mg/dL — ABNORMAL LOW (ref 8.9–10.3)
Chloride: 107 mmol/L (ref 98–111)
Chloride: 109 mmol/L (ref 98–111)
Creatinine, Ser: 0.69 mg/dL (ref 0.44–1.00)
Creatinine, Ser: 0.62 mg/dL (ref 0.44–1.00)
GFR, Estimated: >60 mL/min (ref >60)
GFR, Estimated: >60 mL/min (ref >60)
Glucose, Bld: 241 mg/dL — ABNORMAL HIGH (ref 70–99)
Glucose, Bld: 168 mg/dL — ABNORMAL HIGH (ref 70–99)
Potassium: 3.7 mmol/L (ref 3.5–5.1)
Potassium: 3.5 mmol/L (ref 3.5–5.1)
Sodium: 139 mmol/L (ref 135–145)
Sodium: 141 mmol/L (ref 135–145)

## 2024-07-17 MED ORDER — PROMETHAZINE HCL 25 MG PO TABS
12.5000 mg | ORAL_TABLET | Freq: Four times a day (QID) | ORAL | Status: DC | PRN
  Administered 2024-07-17: 12.5 mg via ORAL
  Filled 2024-07-17: qty 1

## 2024-07-17 MED ORDER — LISDEXAMFETAMINE DIMESYLATE 20 MG PO CAPS: 60.0000 mg | ORAL_CAPSULE | Freq: Every day | ORAL | Status: DC

## 2024-07-17 MED ORDER — HYDROCORTISONE 1 % EX LOTN
TOPICAL_LOTION | Freq: Three times a day (TID) | CUTANEOUS | Status: DC
  Filled 2024-07-17: qty 118

## 2024-07-17 MED ORDER — HYDROMORPHONE HCL 1 MG/ML IJ SOLN
0.5000 mg | Freq: Once | INTRAMUSCULAR | Status: AC | PRN
  Administered 2024-07-17: 0.5 mg via INTRAVENOUS
  Filled 2024-07-17: qty 1

## 2024-07-17 MED ORDER — INSULIN GLARGINE-YFGN 100 UNIT/ML ~~LOC~~ SOLN: 10.0000 [IU] | Freq: Every day | SUBCUTANEOUS | Status: DC

## 2024-07-17 MED ORDER — PANTOPRAZOLE SODIUM 40 MG IV SOLR
40.0000 mg | Freq: Two times a day (BID) | INTRAVENOUS | Status: DC
  Administered 2024-07-17: 40 mg via INTRAVENOUS
  Filled 2024-07-17: qty 10

## 2024-07-17 MED ORDER — PROMETHAZINE HCL 25 MG PO TABS: 25.0000 mg | ORAL_TABLET | Freq: Four times a day (QID) | ORAL | Status: DC | PRN

## 2024-07-17 MED ORDER — DIPHENHYDRAMINE HCL 50 MG/ML IJ SOLN: 12.5000 mg | Freq: Four times a day (QID) | INTRAMUSCULAR | Status: DC | PRN

## 2024-07-17 MED ORDER — METOCLOPRAMIDE HCL 5 MG/ML IJ SOLN
5.0000 mg | Freq: Two times a day (BID) | INTRAMUSCULAR | Status: DC | PRN
Start: 2024-07-17 — End: 2024-07-17

## 2024-07-17 MED ORDER — INSULIN ASPART 100 UNIT/ML IJ SOLN: 0.0000 [IU] | Freq: Every day | INTRAMUSCULAR | Status: DC

## 2024-07-17 MED ORDER — SODIUM CHLORIDE 0.9 % IV SOLN
12.5000 mg | Freq: Once | INTRAVENOUS | Status: AC
  Administered 2024-07-17: 12.5 mg via INTRAVENOUS
  Filled 2024-07-17: qty 12.5

## 2024-07-17 MED ORDER — HYDROXYZINE HCL 10 MG PO TABS
10.0000 mg | ORAL_TABLET | Freq: Three times a day (TID) | ORAL | Status: DC | PRN
  Filled 2024-07-17: qty 1

## 2024-07-17 MED ORDER — MORPHINE SULFATE (PF) 2 MG/ML IV SOLN
1.0000 mg | INTRAVENOUS | Status: DC | PRN
  Administered 2024-07-17: 1 mg via INTRAVENOUS
  Filled 2024-07-17: qty 1

## 2024-07-17 MED ORDER — INSULIN GLARGINE-YFGN 100 UNIT/ML ~~LOC~~ SOLN
10.0000 [IU] | Freq: Every day | SUBCUTANEOUS | Status: DC
  Administered 2024-07-17: 10 [IU] via SUBCUTANEOUS
  Filled 2024-07-17: qty 0.1

## 2024-07-17 MED ORDER — INSULIN GLARGINE-YFGN 100 UNIT/ML ~~LOC~~ SOLN
7.0000 [IU] | Freq: Two times a day (BID) | SUBCUTANEOUS | Status: DC
  Filled 2024-07-17: qty 0.07

## 2024-07-17 MED ORDER — LISDEXAMFETAMINE DIMESYLATE 30 MG PO CAPS: 60.0000 mg | ORAL_CAPSULE | Freq: Every day | ORAL | Status: DC

## 2024-07-17 MED ORDER — SODIUM CHLORIDE 0.9 % IV SOLN: INTRAVENOUS | Status: DC

## 2024-07-17 MED ORDER — INSULIN ASPART 100 UNIT/ML IJ SOLN: 0.0000 [IU] | Freq: Three times a day (TID) | INTRAMUSCULAR | Status: DC

## 2024-07-17 MED ORDER — SODIUM CHLORIDE 0.9 % IV SOLN: 25.0000 mg | Freq: Four times a day (QID) | INTRAVENOUS | Status: DC | PRN

## 2024-07-17 MED ORDER — INSULIN GLARGINE-YFGN 100 UNIT/ML ~~LOC~~ SOLN: 7.0000 [IU] | Freq: Two times a day (BID) | SUBCUTANEOUS | Status: DC

## 2024-07-17 MED ORDER — INSULIN GLARGINE-YFGN 100 UNIT/ML ~~LOC~~ SOLN: 10.0000 [IU] | Freq: Two times a day (BID) | SUBCUTANEOUS | Status: DC

## 2024-07-17 MED ORDER — INSULIN ASPART 100 UNIT/ML IJ SOLN
0.0000 [IU] | Freq: Three times a day (TID) | INTRAMUSCULAR | Status: DC
  Administered 2024-07-17: 3 [IU] via SUBCUTANEOUS
  Administered 2024-07-17: 2 [IU] via SUBCUTANEOUS

## 2024-07-17 MED ORDER — INSULIN GLARGINE-YFGN 100 UNIT/ML ~~LOC~~ SOLN
15.0000 [IU] | Freq: Two times a day (BID) | SUBCUTANEOUS | Status: DC
  Filled 2024-07-17: qty 0.15

## 2024-07-17 MED ORDER — ONDANSETRON HCL 4 MG/2ML IJ SOLN: 4.0000 mg | Freq: Four times a day (QID) | INTRAMUSCULAR | Status: DC | PRN

## 2024-07-17 MED ORDER — PREGABALIN 50 MG PO CAPS: 200.0000 mg | ORAL_CAPSULE | Freq: Two times a day (BID) | ORAL | Status: DC

## 2024-07-17 MED ORDER — INSULIN ASPART 100 UNIT/ML IJ SOLN
3.0000 [IU] | Freq: Three times a day (TID) | INTRAMUSCULAR | Status: DC
  Administered 2024-07-17 (×2): 3 [IU] via SUBCUTANEOUS

## 2024-07-17 MED ORDER — ONDANSETRON HCL 4 MG/2ML IJ SOLN
4.0000 mg | Freq: Four times a day (QID) | INTRAMUSCULAR | Status: DC | PRN
  Filled 2024-07-17: qty 2

## 2024-07-17 MED ORDER — INSULIN REGULAR(HUMAN) IN NACL 100-0.9 UT/100ML-% IV SOLN
INTRAVENOUS | Status: DC
  Administered 2024-07-17: 2.6 [IU]/h via INTRAVENOUS

## 2024-07-17 MED ORDER — CITALOPRAM HYDROBROMIDE 20 MG PO TABS: 40.0000 mg | ORAL_TABLET | Freq: Every day | ORAL | Status: DC

## 2024-07-17 NOTE — Progress Notes (Signed)
 SLP Cancellation Note  Patient Details Name: Julie [name] MRN: [id number] DOB: 1991-08-16   Cancelled treatment:       Reason Eval/Treat Not Completed: Other (comment) Patient declined any PO's at this time as she reports she has been throwing up anything she eats and she won't be able to eat or drink until she gets some Phenergan  for her nausea. SLP performed chart review prior to entering room. She has been seen by SLP for dysphagia in the past here at [facility] as well as through [facility]. Esophagram in 2022 reported that barium tablet failed to pass through distal esophagus and subtle thin indentation at level of barium tablet becoming lodged, suggestive of potential stricture or web.  MBS through [facility] on 11/05/23 reported:  She has had bedside swallow evaluations and most recent through [facility] (FEES was ordered/planned but patient refused due to anxiety with passing scope). MBS was recommended and per patient was scheduled through [facility] in early November. She had a GI appointment scheduled for 07/16/24 but missed that since she was in the ER. SLP is recommending patient f/u with scheduled MBS and to reschedule GI appointment. BSE not indicated. If further concerns for swallowing during this admission, please order MBS with SLP. Thank you for this consult!  Norleen IVAR Blase, MA, CCC-SLP Speech Therapy  07/17/2024, 9:39 AM

## 2024-07-17 NOTE — Plan of Care (Addendum)
  Patient reported that Tigan is not helping with nausea vomiting requesting for Phenergan  per patient she takes Phenergan  every day at home and reported never she had any allergic reaction with Phenergan  as per her knowledge.  Patient is constantly crying and requesting for pain medications, nausea medications and anxiety medication.  Stating that she has a lot of financial pressure and it is very hard for her to take care of 3 kids.   Concern for substance-induced mood disorder with anxiety and depression.  Consulting psychiatry for further evaluation as well.

## 2024-07-17 NOTE — Plan of Care (Addendum)
  BMP showing blood glucose level has been improved 692to 241 normal anion gap and bicarb level.  Transitioning to long-acting insulin  and sliding scale insulin  with full liquid diet.  Patient reported that she has swallowing difficulty and stated that she has history of esophageal stricture even though previous EGD no evidence of esophageal stricture.  At baseline patient uses insulin  pump need to restart insulin  pump in the daytime when diabetic team will be available.  Julie Capobianco, MD [facility] 07/17/2024, 3:05 AM     Repeat BMP showing normal blood glucose level, normal bicarb anion gap level.  Discontinuing insulin  drip/D5 LR and NS.  Continue sliding scale insulin  and long-acting insulin .

## 2024-07-17 NOTE — Consult Note (Signed)
 Service: Psychiatry Consult Service Consult Reason: Substance-Induced Mood Disorder / Evaluation of recurrent hospitalizations and medication-seeking behavior  Identifying Information / Reason for Consultation  Patient is a 33yo female with a history of type 1 diabetes mellitus, PTSD, anxiety, and depression, who was admitted to [facility] for diabetic ketoacidosis (DKA) and intractable nausea and vomiting in the setting of marijuana use. Psychiatry was consulted to evaluate for substance-induced mood disorder and provide recommendations regarding recurrent requests for IV medications.  Interval History / Mental Status Examination Patient was observed resting comfortably upon approach. When engaged, she immediately began to whimper and cry, though no physical tears were noted. She intermittently wiped her eyes as if crying but remained cooperative and appropriate throughout the encounter. She was alert and oriented to person, place, time, and situation. Thought processes were goal-directed but largely focused on somatic complaints and requests for specific IV medications, particularly IV Phenergan , IV Benadryl , and droperidol, which she reports have been effective during prior admissions.  When asked about her symptoms, the patient expressed frustration that oral medications are being offered instead of IV formulations. She stated she has difficulty swallowing pills but can eat normally when nausea is absent. Despite this, she refused oral and some IM options, citing limited efficacy or "allergies." Affect was constricted; mood described as "tired." No overt psychosis, mania, or thought disorder was noted. She denied suicidal ideation, homicidal ideation, or ideas of reference.  Patient discussed notable recent stressors, including moving back in with her mother due to medical and financial limitations, ongoing physical illness that restricts her ability to work or care for her family, and the  emotional toll of repeated hospitalizations. She is currently unemployed and likely disabled due to chronic medical issues.  Collateral / Medical Context  Chart review indicates extensive medical workup by rheumatology, OB/GYN, and GI specialists, all of which have been unremarkable. Current admission was precipitated by DKA and persistent vomiting, possibly related to gastroparesis--a known complication of long-standing type 1 diabetes--and ongoing marijuana use. There is also evidence of recurrent hospitalizations across multiple facilities, averaging at least one admission per month this year (except March).  Records and collateral information note repeated requests for IV Phenergan , IV Benadryl , and droperidol during past hospitalizations, with intermittent reports of "psychological" or functional symptoms such as PNES or conversion disorder. These behaviors raise concern for reinforcement of maladaptive coping patterns and possible secondary gain associated with hospitalization.  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  Rule Out Conversion Disorder / PNES - prior documentation supports consideration of this diagnosis.  Somatic Symptom Disorder vs. Secondary Gain - pattern of hospitalizations and refusal of oral medications suggests possible somatic focus and dependence on medical care for emotional regulation.  At this time, the patient does not exhibit acute psychiatric instability, nor does her current condition place her at risk of harm to herself or others. Both substance-induced and conversion-related symptoms can be effectively managed in an outpatient setting.  Recommendations Continue current medical management per primary team; avoid IV narcotics or sedating IV medications unless medically necessary.  Utilize non-IV antiemetic options (PO or IM) and  reinforce adherence when tolerated.  Encourage cessation of benzodiazepines, marijuana, and methamphetamines, all of which may exacerbate psychiatric and somatic symptoms.  Provide referrals for outpatient psychiatric follow-up if depressive or anxiety symptoms persist after medical stabilization.  Consider chaplain services and/or palliative medicine consults for ongoing emotional support and management of chronic pain  and illness-related stress.  No indication for inpatient psychiatric admission at this time.  Psychiatry consult service will sign off; please reconsult as needed for acute psychiatric changes.

## 2024-07-17 NOTE — Progress Notes (Signed)
 Patient refusing all antiemetic medication at this time (zofran  (patient states she needs IV benadryl ), tigan, compazine) stating she needs 25mg  phenergan  IV only (attempted to administer 12.5 mg phenergan  PO but patient vomited the medication at bedside); spoke with pharmacy on other options for this patient but patient refuses all other measures (reglan , steroid, rectal phernergan) stating the only medication she can take is IV phenergan  at 25mg . Per Dr. Celinda this medication will not be given IV at this time until other options are attempted. Patient continues to refuse and states she would like another doctor and/or wants to be transferred to another hospital. Education is provided on importance of continuing care (diabetic coordinator and psych eval).

## 2024-07-17 NOTE — Progress Notes (Signed)
 This nurse asked the patient if she would like to continue to receive care at this time. Patient stated she is able to sip at the Sprite and would like to receive fluids at this time. Patient was educated that she cannot have another hospitalist and is not eligible to be transferred to another hospital but has the option to leave at any time; patient gave her verbal understanding of this information and stated, I was just so upset this morning and now I just want to sleep and get some fluids to help my dehydration.  Patient also asked for a one time dose of IV phenergan  again, but due to her increased lethargy, per Dr. Celinda does not want to make lethargy this worse. This nurse informed Dr. Celinda of the patient's decision to stay with continuous IV fluids.
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
## 2024-07-17 NOTE — Progress Notes (Signed)
 IV team consulted to obtain PIV access. Patient currently has PIV in her foot. Both upper extremities were assessed with ultrasound. Unsuccessful attempt in LFA d/t unable to thread. Patient's veins very small and/or deep and not long enough to thread catheter. Veins in BUE too deep for midline. If additional access needed, then a central or PICC line should be considered.

## 2024-07-17 NOTE — Inpatient Diabetes Management (Addendum)
 Inpatient Diabetes Program Recommendations  AACE/ADA: New Consensus Statement on Inpatient Glycemic Control (2015)  Target Ranges:  Prepandial:   less than 140 mg/dL      Peak postprandial:   less than 180 mg/dL (1-2 hours)      Critically ill patients:  140 - 180 mg/dL    Latest Reference Range & Units 07/16/24 15:30  Glucose 70 - 99 mg/dL 307 (HH)  (HH): Data is critically high  Latest Reference Range & Units 02/07/24 00:20 05/17/24 11:05  Hemoglobin A1C 4.8 - 5.6 % 13.2 (H) 11.2 (H)  275 mg/dl  (H): Data is abnormally high  Latest Reference Range & Units 07/16/24 15:23 07/16/24 17:47 07/16/24 18:17 07/16/24 19:26 07/16/24 20:49 07/16/24 21:55 07/16/24 22:57 07/17/24 00:09 07/17/24 01:11  Glucose-Capillary 70 - 99 mg/dL >399 (HH) 455 (HH)  IV Insulin  Drip Started 460 (H) 278 (H) 231 (H) 130 (H)  IV Insulin  Drip Stopped 132 (H) 171 (H) 218 (H)    Latest Reference Range & Units 07/17/24 02:12 07/17/24 03:16 07/17/24 04:04 07/17/24 05:11 07/17/24 06:32  Glucose-Capillary 70 - 99 mg/dL 772 (H) 771 (H) 796 (H)  10 units Semglee  137 (H) 164 (H)  (H): Data is abnormally high   Admit with: Hyperglycemia/ Early DKA  History: Type 1 Diabetes (dxd 20210)  Home DM Meds: Dexcom G6 CGM       OmniPod 5 Insulin  Pump with Novolog  Insulin   Current Orders: Novolog  Sensitive Correction Scale/ SSI (0-9 units) TID AC + HS     Novolog  3 units TID with meals     Semglee  7 units BID   MD- Please consider:  1. Increase Semglee  to 10 units BID  2. Change Novolog  SSI to Q4H coverage since pt not able to eat or drink much at this time  3. Stop the Novolog  3 units TID for meal coverage until pt able to take PO's OK   ENDO: Julie Alias, NP with [facility] Last Seen 04/23/2024  Basal rate 1.2 units/h (28.8 units total basal insulin  per 24H)  Carb ratio 1 unit per 8 g  Sensitivity 1 unit per 30 mg/dL  Active insulin  time 4 hours  BG target 120, correct above 150     Addendum  11:15am--Met w/ pt at bedside.  She was teary and upset and told me she was upset that the MD would not give her IV Phenergan .  Was unable to eat or drink anything and stated to me she has abd pain and is nauseated.  Discussed with pt that given her current clinical picture, it would benefit her to stay in the hospital and allow us  to treat her dehydration and hyperglycemia.  Pt did tell me she has more insulin  pump supplies at home.  I have no way to tell if her pump pod had an occlusion or was damaged in any way as the pod was removed last PM in the ED.  Pt uses the Omnipod pump and uses the Dexcom G6 CGM in conjunction with the pump.  Stated to me she does not think any changes have been made to her pump settings since she saw her ENDO in August.  Did not know her pump settings off the top of her head.  Discussed with pt that we have given her long acting insulin  and that we will need to be very specific about the timing of having her restart her insulin  pump when she goes home so as the basal rates on her pump and the basal insulin   we give her don't overlap.  Discussed with pt that we are giving Novolog  insulin  to help with her elevated CBGs.  Also discussed with patient diagnosis of DKA (pathophysiology), treatment of DKA, lab results, and transition plan to SQ insulin  regimen.  All of the above reviewed with RN.       --Will follow patient during hospitalization--  Julie Rudolpho Arrow RN, MSN, CDCES Diabetes Coordinator Inpatient Glycemic Control Team Team Pager: [phone] (8a-5p)

## 2024-07-18 LAB — GC/CHLAMYDIA PROBE AMP (~~LOC~~) NOT AT ARMC
Chlamydia: NEGATIVE
Comment: NEGATIVE
Comment: NORMAL
Neisseria Gonorrhea: NEGATIVE

## 2024-07-18 NOTE — Discharge Summary (Signed)
 Physician Discharge Summary  Julie [name] FMW:[id number] DOB: 1990-12-12 DOA: 07/16/2024  PCP: Sofie Lynwood ORN, PA-C  Admit date: 07/16/2024 Discharge date: 07/18/2024       LEFT AMA Admitted From: Home  Disposition:  Home  Recommendations for Outpatient Follow-up:  None  Home Health:None  Equipment/Devices:None   Discharge Condition:STable  CODE STATUS:FULL  Diet recommendation:   Brief/Interim Summary: 33 y.o. female past medical history significant for insulin -dependent diabetes mellitus type 1, dysphagia PTSD anxiety and depression comes into the emergency room for elevated blood glucose, patient admits to using marijuana benzo amphetamines.  Denies any fever or any change in medications   Discharge Diagnoses:  Principal Problem:   DKA (diabetic ketoacidosis) (HCC) Active Problems:   DM ketoacidosis type I (HCC)   Depression with anxiety   Dysphagia   Hyperglycemia  DKA: Last A1c of 11.2, no known etiology concerned about noncompliance versus insulin  pump malfunction versus bad insulin  batch. She was initially hypotensive and tachycardic was fluids titrated. Started on IV insulin  and IV fluids blood pressure improved. Was transition to long-acting insulin  plus sliding scale.  Intractable nausea and vomiting in the setting of anxiety and depression/substance-induced mood disorder/somatic symptoms versus secondary gain: She relates she has a lot of allergies to Reglan  IV or p.o. Zofran  IV or p.o. Compazine IV or p.o. but she has no allergies to IV Phenergan  or Benadryl . Her home Xanax was resumed. I did have a long conversation with the patient of IV Phenergan  was not needed as she was just nauseated and we and use oral. The pattern of hospitalization and refusal of oral medication suggest possible somatic focus and dependent on medication to regulate emotion.  This is further complicated by stressful social and financial situation at home.   The patient  kept demanding IV Phenergan , IV Benadryl . She has allergies to multiple medications related to nausea except for Phenergan  IV according to the patient although she takes Phenergan  orally at home.  She has a longstanding history of ongoing Cannabis use which could been contributing to her intractable nausea and vomiting. Psych was consulted due to concern of substance-induced mood disorder and they relate that the patient does not exhibit any psychiatric instability noted as her current condition places her at risk of harm.  They relate that both substance-induced and conversion related can be effectively managed as an outpatient they recommended and agree with the medical team to try to avoid IV narcotics and IV sedatives like IV Phenergan  and IV benzodiazepines and Benadryl .  Unless absolutely necessary.  They recommended to reinforce adherence to medications when tolerance and encourage cessation of benzodiazepine marijuana and amphetamines all of which might exacerbate her somatic symptoms. The then proceeded to leave against medical advise.  Sinus tachycardia: Resolved IV fluids.  Urinary retention: Foley was placed and she put out over 1 L, the Foley was removed and she was able to void.  Chronic pruritus: IV Benadryl  was DC'd she was offered Atarax  which she did not want. She was also offered hydrocortisone cream and Benadryl  cream which she refused.  Dysphagia: She relates she cannot swallow any medications but she had an extensive workup as an outpatient here in another facilities.  Her last EGD showed no evidence of stricture in 2023. Speech was consulted they relate she has been seen by them in the past esophagogram showed questionable distal esophageal subtle indentation, but an MBS at [facility] that she had a bedside swallowing evaluation and a fees was recommended but the patient refused due  to anxiety.  MBSS scheduled as an outpatient in early November at [facility], she has an appointment  with GI in October we have have encouraged her to keep that appointment.    Discharge Instructions   Allergies as of 07/17/2024       Reactions   Chloroprocaine Anaphylaxis   Iodinated Contrast Media Itching   Lidocaine  Anaphylaxis   Olanzapine Itching, Nausea And Vomiting, Nausea Only, Other (See Comments)   Ondansetron  Hcl Itching   Penicillins Itching   Has tolerated zosyn   Prochlorperazine Anaphylaxis, Swelling, Anxiety   Sulfa  Antibiotics Hives   Toradol  [ketorolac  Tromethamine ] Hives   Metoclopramide  Other (See Comments)   Cramps   Tramadol Nausea And Vomiting   Celebrex [celecoxib] Diarrhea   Hydroxyzine  Other (See Comments)   Makes her more anxious         Medication List     ASK your doctor about these medications    albuterol  108 (90 Base) MCG/ACT inhaler Commonly known as: VENTOLIN  HFA Inhale 2 puffs into the lungs every 6 (six) hours as needed for wheezing or shortness of breath.   busPIRone  15 MG tablet Commonly known as: BUSPAR  Take 15 mg by mouth 3 (three) times daily.   citalopram  40 MG tablet Commonly known as: CELEXA  Take 40 mg by mouth daily. Ask about: Which instructions should I use?   clobetasol 0.05 % external solution Commonly known as: TEMOVATE Apply 1 Application topically 2 (two) times daily.   Dexcom G6 Sensor Misc Place 1 each onto the skin See admin instructions. Change sensor every 10 days   Dexcom G6 Transmitter Misc 1 each by Does not apply route every 3 (three) months.   diazepam  10 MG tablet Commonly known as: VALIUM  Take 10 mg by mouth daily as needed for anxiety. Ask about: Should I take this medication?   insulin  aspart 100 UNIT/ML FlexPen Commonly known as: NovoLOG  FlexPen Inject 12 Units into the skin 3 (three) times daily with meals. Ask about: Which instructions should I use?   lisdexamfetamine 60 MG capsule Commonly known as: VYVANSE  Take 60 mg by mouth every morning. Ask about: Which instructions should  I use?   Omnipod 5 DexG7G6 Pods Gen 5 Misc Inject 1 Device into the skin every 3 (three) days.   prazosin  2 MG capsule Commonly known as: MINIPRESS  Take 1 capsule (2 mg total) by mouth at bedtime as needed (sleep).   pregabalin  200 MG capsule Commonly known as: LYRICA  Take 200 mg by mouth in the morning, at noon, and at bedtime. Ask about: Which instructions should I use?   promethazine  25 MG suppository Commonly known as: PHENERGAN  Place 1 suppository (25 mg total) rectally every 6 (six) hours as needed for nausea or vomiting. Ask about: Which instructions should I use?   trazodone 300 MG tablet Commonly known as: DESYREL Take 300 mg by mouth at bedtime. Ask about: Which instructions should I use?        Allergies  Allergen Reactions   Chloroprocaine Anaphylaxis   Iodinated Contrast Media Itching   Lidocaine  Anaphylaxis   Olanzapine Itching, Nausea And Vomiting, Nausea Only and Other (See Comments)   Ondansetron  Hcl Itching   Penicillins Itching    Has tolerated zosyn   Prochlorperazine Anaphylaxis, Swelling and Anxiety   Sulfa  Antibiotics Hives   Toradol  [Ketorolac  Tromethamine ] Hives   Metoclopramide  Other (See Comments)    Cramps     Tramadol Nausea And Vomiting   Celebrex [Celecoxib] Diarrhea   Hydroxyzine  Other (See  Comments)    Makes her more anxious     Consultations: Psyq   Procedures/Studies: DG Chest Portable 1 View Result Date: 07/16/2024 CLINICAL DATA:  High blood sugar with pain and burning with urination. EXAM: PORTABLE CHEST 1 VIEW COMPARISON:  Jan 28, 2024 FINDINGS: The heart size and mediastinal contours are within normal limits. Both lungs are clear. The visualized skeletal structures are unremarkable. IMPRESSION: No active disease. Electronically Signed   By: Suzen Dials M.D.   On: 07/16/2024 18:06   (Echo, Carotid, EGD, Colonoscopy, ERCP)    Subjective:   Discharge Exam: Vitals:   07/17/24 1300 07/17/24 1509  BP:  (!)  146/94  Pulse: 80 86  Resp: (!) 0 20  Temp:  98.4 F (36.9 C)  SpO2: 95%    Vitals:   07/17/24 1200 07/17/24 1222 07/17/24 1300 07/17/24 1509  BP:  134/73  (!) 146/94  Pulse: 80  80 86  Resp: 19  (!) 0 20  Temp: 98.4 F (36.9 C)   98.4 F (36.9 C)  TempSrc: Oral   Oral  SpO2: 100% 100% 95%   Weight:      Height:        General: Pt is alert, awake, not in acute distress Cardiovascular: RRR, S1/S2 +, no rubs, no gallops Respiratory: CTA bilaterally, no wheezing, no rhonchi Abdominal: Soft, NT, ND, bowel sounds + Extremities: no edema, no cyanosis    The results of significant diagnostics from this hospitalization (including imaging, microbiology, ancillary and laboratory) are listed below for reference.     Microbiology: Recent Results (from the past 240 hours)  MRSA Next Gen by PCR, Nasal     Status: None   Collection Time: 07/16/24  9:10 PM   Specimen: Nasal Mucosa; Nasal Swab  Result Value Ref Range Status   MRSA by PCR Next Gen NOT DETECTED NOT DETECTED Final    Comment: (NOTE) The GeneXpert MRSA Assay (FDA approved for NASAL specimens only), is one component of a comprehensive MRSA colonization surveillance program. It is not intended to diagnose MRSA infection nor to guide or monitor treatment for MRSA infections. Test performance is not FDA approved in patients less than 23 years old. Performed at [facility], [address], [place], [place] [address]      Labs: BNP (last 3 results) No results for input(s): BNP in the last 8760 hours. Basic Metabolic Panel: Recent Labs  Lab 07/16/24 1530 07/16/24 1605 07/17/24 0128 07/17/24 0426  NA 128* 129* 139 141  K 4.2 4.5 3.7 3.5  CL 94*  --  107 109  CO2 18*  --  24 24  GLUCOSE 692*  --  241* 168*  BUN 19  --  12 10  CREATININE 1.17*  --  0.69 0.62  CALCIUM 9.2  --  8.5* 8.6*   Liver Function Tests: Recent Labs  Lab 07/16/24 1530  AST 40  ALT 15  ALKPHOS 92  BILITOT 0.3   PROT 7.3  ALBUMIN 3.9   Recent Labs  Lab 07/16/24 1557  LIPASE 61*   No results for input(s): AMMONIA in the last 168 hours. CBC: Recent Labs  Lab 07/16/24 1557 07/16/24 1605 07/17/24 0426  WBC 7.1  --  9.1  HGB 13.2 13.9 12.8  HCT 39.2 41.0 39.2  MCV 83.2  --  86.0  PLT 200  --  218   Cardiac Enzymes: No results for input(s): CKTOTAL, CKMB, CKMBINDEX, TROPONINI in the last 168 hours. BNP: Invalid input(s):  POCBNP CBG: Recent Labs  Lab 07/17/24 0511 07/17/24 0632 07/17/24 0742 07/17/24 1133 07/17/24 1702  GLUCAP 137* 164* 182* 244* 281*   D-Dimer No results for input(s): DDIMER in the last 72 hours. Hgb A1c No results for input(s): HGBA1C in the last 72 hours. Lipid Profile No results for input(s): CHOL, HDL, LDLCALC, TRIG, CHOLHDL, LDLDIRECT in the last 72 hours. Thyroid function studies No results for input(s): TSH, T4TOTAL, T3FREE, THYROIDAB in the last 72 hours.  Invalid input(s): FREET3 Anemia work up No results for input(s): VITAMINB12, FOLATE, FERRITIN, TIBC, IRON, RETICCTPCT in the last 72 hours. Urinalysis    Component Value Date/Time   COLORURINE YELLOW 07/16/2024 1524   APPEARANCEUR HAZY (A) 07/16/2024 1524   LABSPEC <=1.005 07/16/2024 1524   PHURINE 5.5 07/16/2024 1524   GLUCOSEU >=500 (A) 07/16/2024 1524   HGBUR TRACE (A) 07/16/2024 1524   BILIRUBINUR NEGATIVE 07/16/2024 1524   KETONESUR NEGATIVE 07/16/2024 1524   PROTEINUR 30 (A) 07/16/2024 1524   UROBILINOGEN 0.2 02/04/2015 1125   NITRITE NEGATIVE 07/16/2024 1524   LEUKOCYTESUR NEGATIVE 07/16/2024 1524   Sepsis Labs Recent Labs  Lab 07/16/24 1557 07/17/24 0426  WBC 7.1 9.1   Microbiology Recent Results (from the past 240 hours)  MRSA Next Gen by PCR, Nasal     Status: None   Collection Time: 07/16/24  9:10 PM   Specimen: Nasal Mucosa; Nasal Swab  Result Value Ref Range Status   MRSA by PCR Next Gen NOT DETECTED NOT DETECTED  Final    Comment: (NOTE) The GeneXpert MRSA Assay (FDA approved for NASAL specimens only), is one component of a comprehensive MRSA colonization surveillance program. It is not intended to diagnose MRSA infection nor to guide or monitor treatment for MRSA infections. Test performance is not FDA approved in patients less than 72 years old. Performed at [facility], [address] [place], [place] [address]      Time coordinating discharge: Over 30 minutes  SIGNED:   Erle Odell Castor, MD  [facility] 07/18/2024, 11:22 AM Pager   If 7PM-7AM, please contact night-coverage [email or web address] Password [email or web address]
